# Patient Record
Sex: Male | Born: 1955 | Race: Black or African American | Hispanic: No | Marital: Single | Smoking: Never smoker
Health system: Southern US, Community
[De-identification: ages and names within clinical notes are randomized; demographics above are authoritative.]

## PROBLEM LIST (undated history)

## (undated) DIAGNOSIS — Z9989 Dependence on other enabling machines and devices: Secondary | ICD-10-CM

## (undated) DIAGNOSIS — E119 Type 2 diabetes mellitus without complications: Secondary | ICD-10-CM

## (undated) DIAGNOSIS — R6882 Decreased libido: Secondary | ICD-10-CM

## (undated) DIAGNOSIS — C61 Malignant neoplasm of prostate: Secondary | ICD-10-CM

## (undated) DIAGNOSIS — F329 Major depressive disorder, single episode, unspecified: Secondary | ICD-10-CM

## (undated) DIAGNOSIS — N529 Male erectile dysfunction, unspecified: Secondary | ICD-10-CM

## (undated) DIAGNOSIS — R339 Retention of urine, unspecified: Secondary | ICD-10-CM

## (undated) DIAGNOSIS — I10 Essential (primary) hypertension: Secondary | ICD-10-CM

## (undated) DIAGNOSIS — M1710 Unilateral primary osteoarthritis, unspecified knee: Secondary | ICD-10-CM

## (undated) DIAGNOSIS — G4733 Obstructive sleep apnea (adult) (pediatric): Secondary | ICD-10-CM

## (undated) DIAGNOSIS — R972 Elevated prostate specific antigen [PSA]: Secondary | ICD-10-CM

## (undated) DIAGNOSIS — Z87442 Personal history of urinary calculi: Secondary | ICD-10-CM

## (undated) DIAGNOSIS — F32A Depression, unspecified: Secondary | ICD-10-CM

## (undated) DIAGNOSIS — I2510 Atherosclerotic heart disease of native coronary artery without angina pectoris: Secondary | ICD-10-CM

## (undated) DIAGNOSIS — N320 Bladder-neck obstruction: Secondary | ICD-10-CM

## (undated) DIAGNOSIS — E785 Hyperlipidemia, unspecified: Secondary | ICD-10-CM

## (undated) DIAGNOSIS — K649 Unspecified hemorrhoids: Secondary | ICD-10-CM

## (undated) HISTORY — DX: Unilateral primary osteoarthritis, unspecified knee: M17.10

## (undated) HISTORY — DX: Retention of urine, unspecified: R33.9

## (undated) HISTORY — DX: Malignant neoplasm of prostate: C61

## (undated) HISTORY — DX: Type 2 diabetes mellitus without complications: E11.9

## (undated) HISTORY — DX: Decreased libido: R68.82

## (undated) HISTORY — DX: Elevated prostate specific antigen (PSA): R97.20

## (undated) HISTORY — DX: Hyperlipidemia, unspecified: E78.5

## (undated) HISTORY — DX: Bladder-neck obstruction: N32.0

## (undated) HISTORY — DX: Obstructive sleep apnea (adult) (pediatric): G47.33

## (undated) HISTORY — DX: Male erectile dysfunction, unspecified: N52.9

## (undated) HISTORY — DX: Obstructive sleep apnea (adult) (pediatric): Z99.89

## (undated) HISTORY — PX: COLONOSCOPY: SHX174

## (undated) HISTORY — PX: OTHER SURGICAL HISTORY: SHX169

---
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
## 2006-07-15 ENCOUNTER — Emergency Department: Payer: Self-pay | Admitting: Emergency Medicine

## 2007-08-10 ENCOUNTER — Emergency Department: Payer: Self-pay | Admitting: Emergency Medicine

## 2007-08-10 ENCOUNTER — Other Ambulatory Visit: Payer: Self-pay

## 2007-08-12 ENCOUNTER — Emergency Department: Payer: Self-pay | Admitting: Emergency Medicine

## 2007-12-27 ENCOUNTER — Emergency Department: Payer: Self-pay | Admitting: Emergency Medicine

## 2008-05-17 ENCOUNTER — Emergency Department: Payer: Self-pay | Admitting: Emergency Medicine

## 2008-06-18 ENCOUNTER — Ambulatory Visit: Payer: Self-pay | Admitting: General Practice

## 2008-06-23 ENCOUNTER — Ambulatory Visit: Payer: Self-pay | Admitting: General Practice

## 2008-08-05 ENCOUNTER — Encounter: Admission: RE | Admit: 2008-08-05 | Discharge: 2008-08-05 | Payer: Self-pay | Admitting: Orthopedic Surgery

## 2008-08-17 ENCOUNTER — Encounter: Admission: RE | Admit: 2008-08-17 | Discharge: 2008-08-17 | Payer: Self-pay | Admitting: Orthopedic Surgery

## 2008-09-01 ENCOUNTER — Encounter: Admission: RE | Admit: 2008-09-01 | Discharge: 2008-09-01 | Payer: Self-pay | Admitting: Orthopedic Surgery

## 2010-08-26 ENCOUNTER — Ambulatory Visit: Payer: Self-pay | Admitting: Unknown Physician Specialty

## 2010-08-28 LAB — PATHOLOGY REPORT

## 2012-04-21 ENCOUNTER — Emergency Department: Payer: Self-pay | Admitting: Internal Medicine

## 2012-04-21 LAB — URINALYSIS, COMPLETE
Bacteria: NONE SEEN
Bilirubin,UR: NEGATIVE
Glucose,UR: 150 mg/dL (ref 0–75)
Leukocyte Esterase: NEGATIVE
Nitrite: NEGATIVE
Ph: 6 (ref 4.5–8.0)
Protein: NEGATIVE
RBC,UR: 92 /HPF (ref 0–5)
Specific Gravity: 1.027 (ref 1.003–1.030)
Squamous Epithelial: <1
WBC UR: 1 /HPF (ref 0–5)

## 2012-04-21 LAB — COMPREHENSIVE METABOLIC PANEL
Albumin: 3.6 g/dL (ref 3.4–5.0)
Alkaline Phosphatase: 114 U/L (ref 50–136)
Anion Gap: 5 — ABNORMAL LOW (ref 7–16)
BUN: 22 mg/dL — ABNORMAL HIGH (ref 7–18)
Bilirubin,Total: 0.4 mg/dL (ref 0.2–1.0)
Calcium, Total: 8.7 mg/dL (ref 8.5–10.1)
Chloride: 107 mmol/L (ref 98–107)
Co2: 27 mmol/L (ref 21–32)
Creatinine: 1.51 mg/dL — ABNORMAL HIGH (ref 0.60–1.30)
EGFR (African American): 59 — ABNORMAL LOW
EGFR (Non-African Amer.): 51 — ABNORMAL LOW
Glucose: 212 mg/dL — ABNORMAL HIGH (ref 65–99)
Osmolality: 287 (ref 275–301)
Potassium: 3.8 mmol/L (ref 3.5–5.1)
SGOT(AST): 24 U/L (ref 15–37)
SGPT (ALT): 35 U/L (ref 12–78)
Sodium: 139 mmol/L (ref 136–145)
Total Protein: 7.5 g/dL (ref 6.4–8.2)

## 2012-04-21 LAB — CBC
HCT: 38.6 % — ABNORMAL LOW (ref 40.0–52.0)
HGB: 12.3 g/dL — ABNORMAL LOW (ref 13.0–18.0)
MCH: 25.5 pg — ABNORMAL LOW (ref 26.0–34.0)
MCHC: 31.8 g/dL — ABNORMAL LOW (ref 32.0–36.0)
MCV: 80 fL (ref 80–100)
Platelet: 300 10*3/uL (ref 150–440)
RBC: 4.81 10*6/uL (ref 4.40–5.90)
RDW: 14.9 % — ABNORMAL HIGH (ref 11.5–14.5)
WBC: 8.1 10*3/uL (ref 3.8–10.6)

## 2012-04-21 LAB — LIPASE, BLOOD: Lipase: 147 U/L (ref 73–393)

## 2013-01-10 DIAGNOSIS — R339 Retention of urine, unspecified: Secondary | ICD-10-CM

## 2013-01-10 DIAGNOSIS — N529 Male erectile dysfunction, unspecified: Secondary | ICD-10-CM

## 2013-01-10 DIAGNOSIS — R6882 Decreased libido: Secondary | ICD-10-CM | POA: Insufficient documentation

## 2013-01-10 HISTORY — DX: Decreased libido: R68.82

## 2013-01-10 HISTORY — DX: Retention of urine, unspecified: R33.9

## 2013-01-10 HISTORY — DX: Male erectile dysfunction, unspecified: N52.9

## 2013-02-21 DIAGNOSIS — R972 Elevated prostate specific antigen [PSA]: Secondary | ICD-10-CM | POA: Insufficient documentation

## 2013-02-21 HISTORY — DX: Elevated prostate specific antigen (PSA): R97.20

## 2013-04-28 DIAGNOSIS — C61 Malignant neoplasm of prostate: Secondary | ICD-10-CM | POA: Insufficient documentation

## 2013-04-28 DIAGNOSIS — N320 Bladder-neck obstruction: Secondary | ICD-10-CM

## 2013-04-28 HISTORY — DX: Bladder-neck obstruction: N32.0

## 2013-04-28 HISTORY — DX: Malignant neoplasm of prostate: C61

## 2013-05-27 ENCOUNTER — Ambulatory Visit: Payer: Self-pay | Admitting: Urology

## 2013-07-14 HISTORY — PX: PROSTATE SURGERY: SHX751

## 2013-07-22 ENCOUNTER — Emergency Department: Payer: Self-pay | Admitting: Emergency Medicine

## 2013-07-22 LAB — URINALYSIS, COMPLETE
Bacteria: NONE SEEN
Bilirubin,UR: NEGATIVE
Glucose,UR: NEGATIVE mg/dL (ref 0–75)
Ketone: NEGATIVE
Nitrite: NEGATIVE
Ph: 7 (ref 4.5–8.0)
Protein: 30
RBC,UR: 38 /HPF (ref 0–5)
Specific Gravity: 1.017 (ref 1.003–1.030)
Squamous Epithelial: 11
WBC UR: 6 /HPF (ref 0–5)

## 2013-07-23 LAB — URINE CULTURE

## 2015-07-19 ENCOUNTER — Ambulatory Visit (INDEPENDENT_AMBULATORY_CARE_PROVIDER_SITE_OTHER): Payer: BLUE CROSS/BLUE SHIELD | Admitting: Urology

## 2015-07-19 ENCOUNTER — Telehealth: Payer: Self-pay

## 2015-07-19 ENCOUNTER — Encounter: Payer: Self-pay | Admitting: Urology

## 2015-07-19 VITALS — BP 152/86 | HR 99 | Ht 73.0 in | Wt 288.4 lb

## 2015-07-19 DIAGNOSIS — Z8546 Personal history of malignant neoplasm of prostate: Secondary | ICD-10-CM | POA: Diagnosis not present

## 2015-07-19 DIAGNOSIS — C61 Malignant neoplasm of prostate: Secondary | ICD-10-CM | POA: Diagnosis not present

## 2015-07-19 NOTE — Telephone Encounter (Deleted)
Schedule and appt for teaching for his trimix.

## 2015-07-19 NOTE — Progress Notes (Signed)
07/19/2015 3:32 PM   Daniel Leyland Jr. 1955/09/08 [id number]  Referring provider: No referring provider defined for this encounter.  Chief Complaint  Patient presents with  . New Patient (Initial Visit)    ED    HPI: The patient is a 60 year old gentleman with PMH of gleason 3+4=7 prostate cancer treated with radical prostatectomy in 2015 presents for discussion of his erectile dysfunction.   1. Prostate cancer Treated with radical prostatectomy in 2015. As far as he knows, his PSA has been undetectable. He has minor incontinence but does not work pads.   2. Erectile dysfunction The patient has been unable to achieve erection since his prostatectomy in June 2015. He has tried Viagra, generic sildenafil, Levitra, and Cialis with no success.  PMH: Past Medical History  Diagnosis Date  . 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 Malignant neoplasm of prostate ([place]) 04/28/2013  . Decreased libido 01/10/2013  . Elevated prostate specific antigen (PSA) 02/21/2013  . Incomplete bladder emptying 01/10/2013    Surgical History: Past Surgical History  Procedure Laterality Date  . Prostate surgery  07/14/2013    Prostatectomy    Home Medications:    Medication List       This list is accurate as of: 07/19/15  3:32 PM.  Always use your most recent med list.               lisinopril-hydrochlorothiazide 20-25 MG tablet  Commonly known as:  PRINZIDE,ZESTORETIC  Take 1 tablet by mouth daily.     lovastatin 20 MG tablet  Commonly known as:  MEVACOR  TK 1 T PO QD     meloxicam 7.5 MG tablet  Commonly known as:  MOBIC  TK 1 T PO QAM PRN P     metFORMIN 500 MG tablet  Commonly known as:  GLUCOPHAGE  Take 500 mg by mouth 2 (two) times daily with a meal.        Allergies: No Known Allergies  Family History: Family History  Problem Relation Age of Onset  . Prostate cancer Neg Hx   . Pancreatic cancer Brother       Social History:  reports that he has never smoked. He does not have any smokeless tobacco history on file. He reports that he does not drink alcohol or use illicit drugs.  ROS: UROLOGY Frequent Urination?: Yes Hard to postpone urination?: No Burning/pain with urination?: No Get up at night to urinate?: Yes Leakage of urine?: Yes Urine stream starts and stops?: No Trouble starting stream?: No Do you have to strain to urinate?: No Blood in urine?: No Urinary tract infection?: No Sexually transmitted disease?: No Injury to kidneys or bladder?: No Painful intercourse?: No Weak stream?: No Erection problems?: Yes Penile pain?: No  Gastrointestinal Nausea?: No Vomiting?: No Indigestion/heartburn?: No Diarrhea?: No Constipation?: No  Constitutional Fever: No Night sweats?: No Weight loss?: No Fatigue?: No  Skin Skin rash/lesions?: No Itching?: Yes  Eyes Blurred vision?: No Double vision?: No  Ears/Nose/Throat Sore throat?: No Sinus problems?: No  Hematologic/Lymphatic Swollen glands?: No Easy bruising?: No  Cardiovascular Leg swelling?: No Chest pain?: No  Respiratory Cough?: No Shortness of breath?: No  Endocrine Excessive thirst?: No  Musculoskeletal Back pain?: Yes Joint pain?: Yes  Neurological Headaches?: No Dizziness?: No  Psychologic Depression?: Yes Anxiety?: No  Physical Exam: BP 152/86 mmHg  Pulse 99  Ht 6\' 1"  (1.854 m)  Wt 288 lb 6.4 oz (130.817 kg)  BMI 38.06 kg/m2  Constitutional:  Alert and oriented, No acute distress. HEENT: [place] AT, moist mucus membranes.  Trachea midline, no masses. Cardiovascular: No clubbing, cyanosis, or edema. Respiratory: Normal respiratory effort, no increased work of breathing. GI: Abdomen is soft, nontender, nondistended, no abdominal masses GU: No CVA tenderness.  Skin: No rashes, bruises or suspicious lesions. Lymph: No cervical or inguinal adenopathy. Neurologic: Grossly intact, no focal  deficits, moving all 4 extremities. Psychiatric: Normal mood and affect.  Laboratory Data: Lab Results  Component Value Date   WBC 8.1 04/21/2012   HGB 12.3* 04/21/2012   HCT 38.6* 04/21/2012   MCV 80 04/21/2012   PLT 300 04/21/2012    Lab Results  Component Value Date   CREATININE 1.51* 04/21/2012    No results found for: PSA  No results found for: TESTOSTERONE  No results found for: HGBA1C  Urinalysis    Component Value Date/Time   COLORURINE Yellow 07/22/2013 1020   APPEARANCEUR Cloudy 07/22/2013 1020   LABSPEC 1.017 07/22/2013 1020   PHURINE 7.0 07/22/2013 1020   GLUCOSEU Negative 07/22/2013 1020   HGBUR 3+ 07/22/2013 1020   BILIRUBINUR Negative 07/22/2013 1020   KETONESUR Negative 07/22/2013 1020   PROTEINUR 30 mg/dL 07/22/2013 1020   NITRITE Negative 07/22/2013 1020   LEUKOCYTESUR 3+ 07/22/2013 1020    Assessment & Plan:    1. History of prostate cancer -Check PSA today  2. Erectile dysfunction -I discussed the patient the pros and cons of various treatment methods for erectile dysfunction. He elects to undergo Trymex injection therapy. We discussed the risks, benefits and indications this procedure. He understands risks include priapism which will need emergent intervention. He will obtain this medication and return to the clinic for teaching. He was instructed not to use this medication until he has had formal teaching.  Return in 2 weeks (on 08/02/2015) for trimix teaching.  Nickie Retort, MD  [facility] [address] [place],  [address] [phone]

## 2015-07-19 NOTE — Telephone Encounter (Addendum)
I called [facility] and called in Trimix 10 syringes/ 1 ml. The pt was instructed at his appt that he needs to schedule a f/u appt and bring his Yrimix so he can be taught how to administer.

## 2015-07-20 LAB — PSA: Prostate Specific Ag, Serum: <0.1 ng/mL (ref 0.0–4.0)

## 2015-07-26 ENCOUNTER — Telehealth: Payer: Self-pay

## 2015-07-26 NOTE — Telephone Encounter (Signed)
-----   Message from Nickie Retort, MD sent at 07/26/2015  8:41 AM EDT ----- Please let patient know PSA is undetectable which is good

## 2015-07-26 NOTE — Telephone Encounter (Signed)
LMOM

## 2015-07-26 NOTE — Telephone Encounter (Signed)
Spoke with pt in reference to PSA results. Pt voiced understanding. Pt stated his syringes came in the mail and therefore he needs to make an appt. Pt was transferred to the front to make f/u appt.

## 2015-07-30 ENCOUNTER — Other Ambulatory Visit: Payer: Self-pay | Admitting: Physician Assistant

## 2015-08-13 ENCOUNTER — Ambulatory Visit (INDEPENDENT_AMBULATORY_CARE_PROVIDER_SITE_OTHER): Payer: BLUE CROSS/BLUE SHIELD | Admitting: Urology

## 2015-08-13 ENCOUNTER — Encounter: Payer: Self-pay | Admitting: Urology

## 2015-08-13 VITALS — BP 105/65 | HR 101 | Ht 73.0 in | Wt 288.0 lb

## 2015-08-13 DIAGNOSIS — Z8546 Personal history of malignant neoplasm of prostate: Secondary | ICD-10-CM | POA: Diagnosis not present

## 2015-08-13 DIAGNOSIS — N5231 Erectile dysfunction following radical prostatectomy: Secondary | ICD-10-CM

## 2015-08-13 NOTE — Progress Notes (Signed)
08/13/2015 10:36 AM   Daniel Bradley. 10-Jan-1956 [id number]  Referring provider: No referring provider defined for this encounter.  Chief Complaint  Patient presents with  . Erectile Dysfunction    Trimix teaching    HPI: The patient is a 60 year old gentleman with PMH of gleason 3+4=7 prostate cancer treated with radical prostatectomy in 2015 presents for discussion of his erectile dysfunction.   1. Prostate cancer Treated with radical prostatectomy in 2015. As far as he knows, his PSA has been undetectable - most recently in June 2017. He has minor incontinence but does not wear pads.   2. Erectile dysfunction The patient has been unable to achieve erection since his prostatectomy in June 2015. He has tried Viagra, generic sildenafil, Levitra, and Cialis with no success.    Patient presents today for Trimix teaching.   PMH: Past Medical History  Diagnosis Date  . 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 Malignant neoplasm of prostate ([place]) 04/28/2013  . Decreased libido 01/10/2013  . Elevated prostate specific antigen (PSA) 02/21/2013  . Incomplete bladder emptying 01/10/2013    Surgical History: Past Surgical History  Procedure Laterality Date  . Prostate surgery  07/14/2013    Prostatectomy    Home Medications:    Medication List       This list is accurate as of: 08/13/15 10:36 AM.  Always use your most recent med list.               lisinopril-hydrochlorothiazide 20-25 MG tablet  Commonly known as:  PRINZIDE,ZESTORETIC  Take 1 tablet by mouth daily.     lovastatin 20 MG tablet  Commonly known as:  MEVACOR  TK 1 T PO QD     meloxicam 7.5 MG tablet  Commonly known as:  MOBIC  TK 1 T PO QAM PRN P     metFORMIN 500 MG tablet  Commonly known as:  GLUCOPHAGE  Take 500 mg by mouth 2 (two) times daily with a meal.        Allergies: No Known Allergies  Family History: Family History    Problem Relation Age of Onset  . Prostate cancer Neg Hx   . Pancreatic cancer Brother     Social History:  reports that he has never smoked. He does not have any smokeless tobacco history on file. He reports that he does not drink alcohol or use illicit drugs.  ROS: UROLOGY Frequent Urination?: No Hard to postpone urination?: No Burning/pain with urination?: No Get up at night to urinate?: No Leakage of urine?: No Urine stream starts and stops?: No Trouble starting stream?: No Do you have to strain to urinate?: No Blood in urine?: No Urinary tract infection?: No Sexually transmitted disease?: No Injury to kidneys or bladder?: No Painful intercourse?: No Weak stream?: No Erection problems?: Yes Penile pain?: No  Gastrointestinal Nausea?: No Vomiting?: No Indigestion/heartburn?: No Diarrhea?: No Constipation?: No  Constitutional Fever: No Night sweats?: No Weight loss?: No Fatigue?: No  Skin Skin rash/lesions?: No Itching?: No  Eyes Blurred vision?: No Double vision?: No  Ears/Nose/Throat Sore throat?: No Sinus problems?: No  Hematologic/Lymphatic Swollen glands?: No Easy bruising?: No  Cardiovascular Leg swelling?: No Chest pain?: No  Respiratory Cough?: No Shortness of breath?: No  Endocrine Excessive thirst?: No  Musculoskeletal Back pain?: No Joint pain?: No  Neurological Headaches?: No Dizziness?: No  Psychologic Depression?: No Anxiety?: No  Physical Exam: BP 105/65 mmHg  Pulse 101  Ht 6\' 1"  (1.854 m)  Wt 288 lb (130.636 kg)  BMI 38.01 kg/m2  Constitutional:  Alert and oriented, No acute distress. HEENT: [place] AT, moist mucus membranes.  Trachea midline, no masses. Cardiovascular: No clubbing, cyanosis, or edema. Respiratory: Normal respiratory effort, no increased work of breathing. GI: Abdomen is soft, nontender, nondistended, no abdominal masses GU: No CVA tenderness.  Skin: No rashes, bruises or suspicious  lesions. Lymph: No cervical or inguinal adenopathy. Neurologic: Grossly intact, no focal deficits, moving all 4 extremities. Psychiatric: Normal mood and affect.  Laboratory Data: Lab Results  Component Value Date   WBC 8.1 04/21/2012   HGB 12.3* 04/21/2012   HCT 38.6* 04/21/2012   MCV 80 04/21/2012   PLT 300 04/21/2012    Lab Results  Component Value Date   CREATININE 1.51* 04/21/2012    No results found for: PSA  No results found for: TESTOSTERONE  No results found for: HGBA1C  Urinalysis    Component Value Date/Time   COLORURINE Yellow 07/22/2013 1020   APPEARANCEUR Cloudy 07/22/2013 1020   LABSPEC 1.017 07/22/2013 1020   PHURINE 7.0 07/22/2013 1020   GLUCOSEU Negative 07/22/2013 1020   HGBUR 3+ 07/22/2013 1020   BILIRUBINUR Negative 07/22/2013 1020   KETONESUR Negative 07/22/2013 1020   PROTEINUR 30 mg/dL 07/22/2013 1020   NITRITE Negative 07/22/2013 1020   LEUKOCYTESUR 3+ 07/22/2013 1020     Assessment & Plan:    The patient was demonstrated how to use Trimix injection stay. He was informed to only injected 3 or 9:00 on his penis. He was warned not to inject at any other location as this risk damage to the urethra and penile nerves. He voiced an understanding. 0.5 mL was injected into his left corpora. Able to achieve a semirigid erection. We'll continue this dosage for now. He was instructed to increase the dose by 0.1 mL total of 0.6 mL if his erection is not sufficient for intercourse after foreplay. He was again warned of the risk of priapism and the need for emergent intervention.  1. Erectile dysfunction -patient will use trimix. He'll follow-up in 3 months to assess his progress  2. History of prostate cancer Due for PSA in June 2018  Return in about 3 months (around 11/13/2015).  Nickie Retort, MD  [facility] [address] [place], [place] [address] [phone]

## 2015-09-04 ENCOUNTER — Other Ambulatory Visit: Payer: Self-pay | Admitting: Physician Assistant

## 2015-09-04 NOTE — Telephone Encounter (Signed)
I don't think this is your patient.    Thanks,   -Mickel Baas

## 2015-11-13 ENCOUNTER — Encounter: Payer: Self-pay | Admitting: Urology

## 2015-11-13 ENCOUNTER — Ambulatory Visit (INDEPENDENT_AMBULATORY_CARE_PROVIDER_SITE_OTHER): Payer: BLUE CROSS/BLUE SHIELD | Admitting: Urology

## 2015-11-13 VITALS — BP 141/82 | HR 84 | Ht 73.0 in | Wt 298.2 lb

## 2015-11-13 DIAGNOSIS — Z8546 Personal history of malignant neoplasm of prostate: Secondary | ICD-10-CM

## 2015-11-13 DIAGNOSIS — N529 Male erectile dysfunction, unspecified: Secondary | ICD-10-CM | POA: Diagnosis not present

## 2015-11-13 NOTE — Progress Notes (Signed)
11/13/2015 10:02 AM   Daniel Leyland Jr. [birth year] [id number]  Referring provider: No referring provider defined for this encounter.  Chief Complaint  Patient presents with  . Erectile Dysfunction    3 month follow up  . Prostate Cancer    history of     HPI: The patient is a 60 year old African American gentleman with PMH of gleason 3+4=7 prostate cancer treated with radical prostatectomy in 2015 presents for further discussion of his erectile dysfunction after being prescribed Trimix and injected with test dose.    1. Prostate cancer Treated with radical prostatectomy in 2015. As far as he knows, his PSA has been undetectable - most recently in June 2017. He has minor incontinence but does not wear pads.   2. Erectile dysfunction The patient has been unable to achieve erection since his prostatectomy in June 2015. He has tried Viagra, generic sildenafil, Levitra, and Cialis with no success.   Patient was injected with .5 mL of Trimix and experienced a moderate erection.  His erection was not prolonged or painful.  It did not have a curve.  He has been hesitant to injection himself since his office visit three months ago.   His SHIM score today is 5.        SHIM    Row Name 11/13/15 0917         SHIM: Over the last 6 months:   How do you rate your confidence that you could get and keep an erection? Very Low     When you had erections with sexual stimulation, how often were your erections hard enough for penetration (entering your partner)? Almost Never or Never     During sexual intercourse, how often were you able to maintain your erection after you had penetrated (entered) your partner? Extremely Difficult     During sexual intercourse, how difficult was it to maintain your erection to completion of intercourse? Extremely Difficult     When you attempted sexual intercourse, how often was it satisfactory for you? Extremely Difficult       SHIM Total Score   SHIM 5        Score: 1-7 Severe ED 8-11 Moderate ED 12-16 Mild-Moderate ED 17-21 Mild ED 22-25 No ED   PMH: Past Medical History:  Diagnosis Date  . 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 Decreased libido 01/10/2013  . 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 Elevated prostate specific antigen (PSA) 02/21/2013  . Incomplete bladder emptying 01/10/2013  . Malignant neoplasm of prostate ([place]) 04/28/2013    Surgical History: Past Surgical History:  Procedure Laterality Date  . PROSTATE SURGERY  07/14/2013   Prostatectomy    Home Medications:    Medication List       Accurate as of 11/13/15 10:02 AM. Always use your most recent med list.          lisinopril-hydrochlorothiazide 20-25 MG tablet Commonly known as:  PRINZIDE,ZESTORETIC Take 1 tablet by mouth daily.   lovastatin 20 MG tablet Commonly known as:  MEVACOR TK 1 T PO QD   meloxicam 7.5 MG tablet Commonly known as:  MOBIC TK 1 T PO QAM PRN P   metFORMIN 500 MG tablet Commonly known as:  GLUCOPHAGE Take 500 mg by mouth 2 (two) times daily with a meal.       Allergies: No Known Allergies  Family History: Family History  Problem Relation Age of Onset  . Pancreatic cancer Brother   . Prostate cancer Neg  Hx   . Kidney disease Neg Hx     Social History:  reports that he has never smoked. He has never used smokeless tobacco. He reports that he does not drink alcohol or use drugs.  ROS: UROLOGY Frequent Urination?: No Hard to postpone urination?: No Burning/pain with urination?: No Get up at night to urinate?: No Leakage of urine?: No Urine stream starts and stops?: No Trouble starting stream?: No Do you have to strain to urinate?: No Blood in urine?: No Urinary tract infection?: No Sexually transmitted disease?: No Injury to kidneys or bladder?: No Painful intercourse?: No Weak stream?: No Erection problems?: Yes Penile pain?: No  Gastrointestinal Nausea?: No Vomiting?:  No Indigestion/heartburn?: No Diarrhea?: No Constipation?: No  Constitutional Fever: No Night sweats?: No Weight loss?: No Fatigue?: No  Skin Skin rash/lesions?: No Itching?: No  Eyes Blurred vision?: No Double vision?: No  Ears/Nose/Throat Sore throat?: No Sinus problems?: No  Hematologic/Lymphatic Swollen glands?: No Easy bruising?: No  Cardiovascular Leg swelling?: No Chest pain?: No  Respiratory Cough?: No Shortness of breath?: No  Endocrine Excessive thirst?: No  Musculoskeletal Back pain?: No Joint pain?: No  Neurological Headaches?: No Dizziness?: No  Psychologic Depression?: No Anxiety?: No  Physical Exam: BP (!) 141/82   Pulse 84   Ht 6\' 1"  (1.854 m)   Wt 298 lb 3.2 oz (135.3 kg)   BMI 39.34 kg/m   Constitutional:  Alert and oriented, No acute distress. HEENT: [place] AT, moist mucus membranes.  Trachea midline, no masses. Cardiovascular: No clubbing, cyanosis, or edema. Respiratory: Normal respiratory effort, no increased work of breathing. GI: Abdomen is soft, nontender, nondistended, no abdominal masses GU: No CVA tenderness.  Skin: No rashes, bruises or suspicious lesions. Lymph: No cervical or inguinal adenopathy. Neurologic: Grossly intact, no focal deficits, moving all 4 extremities. Psychiatric: Normal mood and affect.  Laboratory Data: Lab Results  Component Value Date   WBC 8.1 04/21/2012   HGB 12.3 (L) 04/21/2012   HCT 38.6 (L) 04/21/2012   MCV 80 04/21/2012   PLT 300 04/21/2012    Lab Results  Component Value Date   CREATININE 1.51 (H) 04/21/2012      Assessment & Plan:    I reviewed with the patient how to use Trimix injection today.  He was informed to only injected 3 or 9:00 on his penis.  He was warned not to inject at any other location as this risk damage to the urethra and penile nerves.  He voiced an understanding.  We'll continue this dosage for now.  He was instructed to increase the dose by 0.1 mL total  of 0.6 mL if his erection is not sufficient for intercourse after foreplay. He was again warned of the risk of priapism and the need for emergent intervention.  1. Erectile dysfunction -patient will use trimix if he gains confidence with the injections  2. History of prostate cancer Due for PSA in June 2018  Return for in JUNE 2018 for PSA, IPSS and exam.  Zara Council, [facility]  [facility] [address], [place], [place] [address] [phone]

## 2016-05-12 ENCOUNTER — Encounter: Payer: Self-pay | Admitting: Dietician

## 2016-05-12 ENCOUNTER — Encounter: Payer: BLUE CROSS/BLUE SHIELD | Attending: Family Medicine | Admitting: Dietician

## 2016-05-12 VITALS — Ht 73.0 in | Wt 293.4 lb

## 2016-05-12 DIAGNOSIS — E119 Type 2 diabetes mellitus without complications: Secondary | ICD-10-CM | POA: Diagnosis not present

## 2016-05-12 NOTE — Progress Notes (Signed)
Medical Nutrition Therapy: Visit start time: 3825  end time: 1630  Assessment:  Diagnosis: Diabetes, obesity Past medical history: HLD, HTN, sleep apnea Psychosocial issues/ stress concerns: some recent mild depression symptoms due to death of sibling; has lost 3 siblings in the past 3 years.   Preferred learning method:  . Hands-on  Current weight: 293.4lbs with shoes  Height: 6'1" Medications, supplements: reconciled list in medical record  Progress and evaluation: Patient reports no previous diabetes education. He has been working on weight loss and had lost from 343lbs to 265lbs when sister passed away, stopped regular exercise, and ate more over the holidays -- then has regained some of the weight. He feels he is now ready to resume healthier lifestyle habits. He states he loves sweets and snacks on sweets at night, also loves pizza.    Physical activity: walking during day at work, up and down steps; was going to [employer]-- plans to resume as of today.  Dietary Intake:  Usual eating pattern includes 3 meals and 1 snacks per day. Dining out frequency: 1-2 meals per week.  Breakfast: salad, bagel with cheese and ham, fruit orange, apple, cantaloupe, occasional biscuit Snack: none Lunch: brings from home: ham and cheese sandwich with mayo on white bread, chips, fruit Snack: none Supper: reports larger portions-- baked, broiled meats. Fish and salad; spaghetti and salad; baked chicken with potato; loves pizza Snack: loves ice cream, cookies, cakes, etc.  Beverages: water, 1 can pepsi every 1-2 days  Nutrition Care Education: Topics covered: diabetes, weight management Basic nutrition: basic food groups, appropriate nutrient balance, appropriate meal and snack schedule, general nutrition guidelines    Weight control: benefits of weight control, behavioral changes for weight loss-- controlling food portions by decreasing portions of starches and/or meats, and increasing vegetable  portions. Worked on basic meal planning using plate method and food models. Discussed role of exercise in managing weight, mood, diabetes, and cholesterol. Diabetes: appropriate meal and snack schedule, appropriate carb intake and balance, reviewed lab results for BG, HbA1C Hyperlipidemia:  target goals for lipids, healthy and unhealthy fats, role of fiber  Nutritional Diagnosis:  [place]-2.2 Altered nutrition-related laboratory As related to diabetes, LDL cholesterol.  As evidenced by lab report. [place]-3.3 Overweight/obesity As related to excess calories, decreased activity.  As evidenced by BMI 38.5.  Intervention: Instruction as noted above.   Patient will work to decrease sugar intake and improve nutritional balance in his meals.    Set goals with input from patient.   Education Materials given:  . General diet guidelines for Diabetes . Plate Planner with food lists . Sample meal pattern/ menus . Snacking handout . Goals/ instructions  Learner/ who was taught:  . Patient   Level of understanding: Marland Kitchen Verbalizes/ demonstrates competency  Demonstrated degree of understanding via:   Teach back Learning barriers: . None  Willingness to learn/ readiness for change: . Eager, change in progress  Monitoring and Evaluation:  Dietary intake, exercise, BG control, and body weight      follow up: 06/11/16

## 2016-05-12 NOTE — Patient Instructions (Addendum)
   Choose healthy snacks at night. Use snack list for ideas. Work on reducing how often you eat sweets, or smaller portions.   Eat balanced meals with protein, controlled amounts of starches, and plenty of vegetables and/ or fruits with meals.   Resume exercise at gym.

## 2016-06-11 ENCOUNTER — Encounter: Payer: Self-pay | Admitting: Dietician

## 2016-06-11 ENCOUNTER — Encounter: Payer: BLUE CROSS/BLUE SHIELD | Attending: Family Medicine | Admitting: Dietician

## 2016-06-11 VITALS — Ht 73.0 in | Wt 293.0 lb

## 2016-06-11 DIAGNOSIS — E119 Type 2 diabetes mellitus without complications: Secondary | ICD-10-CM

## 2016-06-11 NOTE — Patient Instructions (Signed)
   Include a snack before going to the gym such as a small sandwich, fruit and nuts or lowfat cheese, or a granola bar.   Limit carbs with nighttime snack-- keep to 30grams or less, maybe 1/2 or 1 sandwich, fruit and nuts or cheese, 3-6 cups popcorn with nuts or cheese, 2-3 whole graham crackers with peanut butter.   Remember to eat enough during the day to help control hunger and appetite at night.  Check blood sugar 2 hours after supper occasionally(instead of after lunch)  to make sure it is at least under 180, ideally under 150. Fasting blood sugar (no eating, and resting for at least 8 hours) goal is 130 or less.

## 2016-06-11 NOTE — Progress Notes (Signed)
Medical Nutrition Therapy: Visit start time: 0900  end time: 0945 Assessment:  Diagnosis: Diabetes, obesity Medical history changes: no changes Psychosocial issues/ stress concerns: some depression at time per patient  Current weight: 293lbs  Height: 6'1" Medications, supplement changes: no changes  Progress and evaluation: Patient reports increased exercise, although not as often as he should.  Has decreased sweets. Feels he is overeating bread, some food portions due to hunger in the evenings. He reports fasting BGs average 120s in am; 90s-low 100s 2-3 hours after lunch.   Physical activity: gym 2-3 times a week treadmill 35-40 min and weights 15-58min, crunches 10-15  Dietary Intake:  Usual eating pattern includes 2-3 meals and 1-2 snacks per day. Dining out frequency: not assessed today.  Breakfast: low fat, low sodium ham or [place] sandwich, sometimes skips if not hungry Snack: granola or protein bar Lunch: 11am--sandwich, banana apple, or grapes or granola bar if not hungry Snack: none Supper: baked, broiled meats like fish, chicken with salad or other veg., potato, pasta.  Snack: sometimes more of supper foods, or cereal.  Beverages: mostly water, rarely soda  Nutrition Care Education: Topics covered: weight management, diabetes Basic nutrition: appropriate meal and snack schedule    Weight control: basic caloric and carbohydrate needs/ limits to achieve weight loss; managing evening hunger by icnreasing intake earlier in the day and controlling portions of starches; strategies to control food portions. Diabetes:  goals for BGs, appropriate meal and snack schedule, appropriate carb intake and balance--max 4 carb servings or 60g each meal; used food models to review portions and meal balance. Discussed including high fiber foods and adequate low-carb vegetables and protein to control appetite.    Nutritional Diagnosis:  [place]-3.3 Overweight/obesity As related to excess calories.  As  evidenced by BMI 38.6, patient report.  Intervention: Discussion as noted above.   Updated goals with input from patient.    Patient states he will continue to increase exercise, feels more ready now to work on additional diet changes.  Education Materials given:  Marland Kitchen Goals/ instructions  Learner/ who was taught:  . Patient   Level of understanding: Marland Kitchen Verbalizes/ demonstrates competency  Demonstrated degree of understanding via:   Teach back Learning barriers: . None  Willingness to learn/ readiness for change: . Eager, change in progress  Monitoring and Evaluation:  Dietary intake, exercise, BG control, and body weight      follow up: 07/23/16

## 2016-07-13 NOTE — Progress Notes (Signed)
07/14/2016 11:08 AM   Daniel Leyland Jr. 1955/05/08 [id number]  Referring provider: No referring provider defined for this encounter.  Chief Complaint  Patient presents with  . Prostate Cancer    61months w/PSA    HPI: The patient is a 61 year old African American gentleman with PMH of gleason 3+4=7 prostate cancer treated with radical prostatectomy in 2015 and ED who presents today for a 6 month follow up.  1. Prostate cancer Treated with radical prostatectomy in 2015. As far as he knows, his PSA has been undetectable - most recently in February 2018.   He has minor incontinence but does not wear pads.   I PSS score is 5/1.        IPSS    Row Name 07/14/16 1000         International Prostate Symptom Score   How often have you had the sensation of not emptying your bladder? Not at All     How often have you had to urinate less than every two hours? Less than half the time     How often have you found you stopped and started again several times when you urinated? Not at All     How often have you found it difficult to postpone urination? Not at All     How often have you had a weak urinary stream? Not at All     How often have you had to strain to start urination? Not at All     How many times did you typically get up at night to urinate? 3 Times     Total IPSS Score 5       Quality of Life due to urinary symptoms   If you were to spend the rest of your life with your urinary condition just the way it is now how would you feel about that? Pleased        Score:  1-7 Mild 8-19 Moderate 20-35 Severe  2. Erectile dysfunction SHIM score today is 11.  The patient has been unable to achieve erection since his prostatectomy in June 2015. He has tried Viagra, generic sildenafil, Levitra, and Cialis with no success.   Patient was injected with .5 mL of Trimix and experienced a moderate erection.  His erection was not prolonged or painful.  It did not have a curve.  He has not  used the Trimix, since last visit with [place].  His previous SHIM score was 5.        SHIM    Row Name 07/14/16 1024         SHIM: Over the last 6 months:   How do you rate your confidence that you could get and keep an erection? Very Low     When you had erections with sexual stimulation, how often were your erections hard enough for penetration (entering your partner)? Sometimes (about half the time)     During sexual intercourse, how often were you able to maintain your erection after you had penetrated (entered) your partner? Sometimes (about half the time)     During sexual intercourse, how difficult was it to maintain your erection to completion of intercourse? Difficult     When you attempted sexual intercourse, how often was it satisfactory for you? Almost Never or Never       SHIM Total Score   SHIM 11        Score: 1-7 Severe ED 8-11 Moderate ED 12-16 Mild-Moderate ED 17-21 Mild ED 22-25  No ED   PMH: Past Medical History:  Diagnosis Date  . 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 Decreased libido 01/10/2013  . 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 Elevated prostate specific antigen (PSA) 02/21/2013  . Incomplete bladder emptying 01/10/2013  . Malignant neoplasm of prostate ([place]) 04/28/2013    Surgical History: Past Surgical History:  Procedure Laterality Date  . PROSTATE SURGERY  07/14/2013   Prostatectomy    Home Medications:  Allergies as of 07/14/2016   No Known Allergies     Medication List       Accurate as of 07/14/16 11:08 AM. Always use your most recent med list.          Alprostadil (Vasodilator) 500 MCG Pllt Commonly known as:  MUSE Place the pellet into the ureteral meatus 30 minutes prior to intercourse   atorvastatin 20 MG tablet Commonly known as:  LIPITOR TK 1 T PO QHS   lisinopril-hydrochlorothiazide 20-25 MG tablet Commonly known as:  PRINZIDE,ZESTORETIC Take 1 tablet by mouth daily.   meloxicam 7.5 MG  tablet Commonly known as:  MOBIC TK 1 T PO QAM PRN P   metFORMIN 500 MG tablet Commonly known as:  GLUCOPHAGE Take 500 mg by mouth 2 (two) times daily with a meal.       Allergies: No Known Allergies  Family History: Family History  Problem Relation Age of Onset  . Pancreatic cancer Brother   . Prostate cancer Neg Hx   . Kidney disease Neg Hx     Social History:  reports that he has never smoked. He has never used smokeless tobacco. He reports that he does not drink alcohol or use drugs.  ROS: UROLOGY Frequent Urination?: Yes Hard to postpone urination?: No Burning/pain with urination?: No Get up at night to urinate?: Yes Leakage of urine?: Yes Urine stream starts and stops?: No Trouble starting stream?: No Do you have to strain to urinate?: No Blood in urine?: No Urinary tract infection?: No Sexually transmitted disease?: No Injury to kidneys or bladder?: No Painful intercourse?: No Weak stream?: No Erection problems?: Yes Penile pain?: No  Gastrointestinal Nausea?: No Vomiting?: No Indigestion/heartburn?: No Diarrhea?: No Constipation?: No  Constitutional Fever: No Night sweats?: No Weight loss?: No Fatigue?: No  Skin Skin rash/lesions?: No Itching?: No  Eyes Blurred vision?: No Double vision?: No  Ears/Nose/Throat Sore throat?: No Sinus problems?: No  Hematologic/Lymphatic Swollen glands?: No Easy bruising?: No  Cardiovascular Leg swelling?: No Chest pain?: No  Respiratory Cough?: No Shortness of breath?: No  Endocrine Excessive thirst?: No  Musculoskeletal Back pain?: Yes Joint pain?: Yes  Neurological Headaches?: No Dizziness?: No  Psychologic Depression?: No Anxiety?: No  Physical Exam: BP (!) 171/105   Pulse 100   Ht 6\' 1"  (1.854 m)   Wt 298 lb (135.2 kg)   BMI 39.32 kg/m   Constitutional:  Alert and oriented, No acute distress. HEENT: [place] AT, moist mucus membranes.  Trachea midline, no  masses. Cardiovascular: No clubbing, cyanosis, or edema. Respiratory: Normal respiratory effort, no increased work of breathing. GI: Abdomen is soft, nontender, nondistended, no abdominal masses GU: No CVA tenderness.  No bladder fullness or masses.  Patient with uncircumcised phallus. Foreskin easily retracted.  Vitiligo present.  Urethral meatus is patent.  No penile discharge. No penile lesions or rashes. Scrotum without lesions, cysts, rashes and/or edema.  Testicles are located scrotally bilaterally. No masses are appreciated in the testicles. Left and right epididymis are normal. Rectal: Patient with  normal sphincter tone. Anus and perineum without scarring or rashes. No rectal masses are appreciated. Prostate is surgically absent.  Skin: No rashes, bruises or suspicious lesions. Lymph: No cervical or inguinal adenopathy. Neurologic: Grossly intact, no focal deficits, moving all 4 extremities. Psychiatric: Normal mood and affect.  Laboratory Data: Lab Results  Component Value Date   WBC 8.1 04/21/2012   HGB 12.3 (L) 04/21/2012   HCT 38.6 (L) 04/21/2012   MCV 80 04/21/2012   PLT 300 04/21/2012    Lab Results  Component Value Date   CREATININE 1.51 (H) 04/21/2012      Assessment & Plan:     1. Erectile dysfunction  - SHIM score is 11, it is improving  - I explained to the patient that in order to achieve an erection it takes good functioning of the nervous system (parasympathetic, sympathetic, sensory and motor), good blood flow into the erectile tissue of the penis and a desire to have sex  - I explained that conditions like diabetes, hypertension, coronary artery disease, peripheral vascular disease, smoking, alcohol consumption, age, sleep apnea and BPH can diminish the ability to have an erection  - A recent study published in Sex Med 2018 Apr 13 revealed moderate to vigorous aerobic exercise for 40 minutes 4 times per week can decrease erectile problems caused by physical  inactivity, obesity, hypertension, metabolic syndrome and/or cardiovascular diseases  - We discussed trying intra-urethral suppositories  - He would like to try MUSE  - RTC in 6 months for repeat SHIM score and exam    2. History of prostate cancer Due for PSA in June 2018 - patient will have blood work drawn at his employers  Return in about 6 months (around 01/13/2017) for IPSS, SHIM, PSA and exam.  Zara Council, [facility]  [place] [address] [place], [place] [address] [phone]

## 2016-07-14 ENCOUNTER — Encounter: Payer: Self-pay | Admitting: Urology

## 2016-07-14 ENCOUNTER — Other Ambulatory Visit: Payer: Self-pay

## 2016-07-14 ENCOUNTER — Ambulatory Visit (INDEPENDENT_AMBULATORY_CARE_PROVIDER_SITE_OTHER): Payer: BLUE CROSS/BLUE SHIELD | Admitting: Urology

## 2016-07-14 VITALS — BP 171/105 | HR 100 | Ht 73.0 in | Wt 298.0 lb

## 2016-07-14 DIAGNOSIS — Z8546 Personal history of malignant neoplasm of prostate: Secondary | ICD-10-CM | POA: Diagnosis not present

## 2016-07-14 DIAGNOSIS — N5231 Erectile dysfunction following radical prostatectomy: Secondary | ICD-10-CM

## 2016-07-14 MED ORDER — ALPROSTADIL (VASODILATOR) 500 MCG UR PLLT: PELLET | URETHRAL | 11 refills | Status: DC

## 2016-07-23 ENCOUNTER — Ambulatory Visit: Payer: BLUE CROSS/BLUE SHIELD | Admitting: Dietician

## 2016-07-30 ENCOUNTER — Encounter: Payer: BLUE CROSS/BLUE SHIELD | Attending: Family Medicine | Admitting: Dietician

## 2016-07-30 ENCOUNTER — Encounter: Payer: Self-pay | Admitting: Dietician

## 2016-07-30 VITALS — Ht 73.0 in | Wt 294.3 lb

## 2016-07-30 DIAGNOSIS — E119 Type 2 diabetes mellitus without complications: Secondary | ICD-10-CM | POA: Diagnosis not present

## 2016-07-30 NOTE — Patient Instructions (Signed)
   Plan one "cheat meal" each week when you eat out.   For your other restaurant meals, plan to take 1/2 of the starch and meat home to plan for leftovers, or share a meal with someone, or order form a lower-calorie menu.   Work on decreasing regular Pepsis, try a different variety of diet drink, such as diet Dr. Malachi Bonds, or Diet mt. Dew, etc.   For tea, make with Splenda/ sucralose. At restaurants you can order unsweet and add splenda, or at least order 1/2 and 1/2.

## 2016-07-30 NOTE — Progress Notes (Signed)
Medical Nutrition Therapy: Visit start time: 0900  end time: 0930  Assessment:  Diagnosis: diabetes, obesity Medical history changes: no changes per patient Psychosocial issues/ stress concerns: none  Current weight: 294.3lbs  Height: 6'1" Medications, supplement changes: no changes  Progress and evaluation: Weight gain of 1.3lbs since previous visit on 06/11/16. Patient reports some ups and downs in motivation and time in the past month with both eating habits and exercise; he thinks likely due to some depression. Has improved in the last 2 weeks. He states his girlfriend will also start working on weight loss with him, and he feels more motivated now.    Physical activity: gym 60 minutes, 5-6 days a week  Dietary Intake:  Usual eating pattern includes 2-3 meals and 1-2 snacks per day. Dining out frequency: 2-3 meals per week.  Breakfast: sometimes none due to heat, sometimes sandwich Snack: granola or protein bar Lunch: sandwich and fruit Snack: none Supper: lean broiled or baked meat with salad. Sometimes out -- overeats Snack: cereal; craves sweets -- tries to avoid, but if there is difficult to resist.  Beverages: mostly water, 1 soda daily, mixes with water sometimes  Nutrition Care Education: Topics covered: weight management, diabetes  Weight control: benefits of weight control, strategies for decreasing sugar intake and controlling food portions.  Advanced nutrition: dining out Diabetes:  appropriate carb intake and balance, effects of sugary beverages on blood sugar and weight Other lifestyle changes:  benefits of regular exercise on blood sugar, weight, blood pressure. Etc.  Nutritional Diagnosis:  [place]-2.2 Altered nutrition-related laboratory As related to diabetes, LDL cholesterol, HTN.  As evidenced by lab reports, MD notes. [place]-3.3 Overweight/obesity As related to excess calories.  As evidenced by BMI 38.5, patient report.  Intervention: Discussion as noted  above.   Updated goals with input from patient.    Commended him for recent changes made to increase activity and garner support.    Patient declined scheduling another follow-up at this time, as he feels more motivated to make changes on his own.    Encouraged patient to call with any questions or concerns.  Education Materials given:  Marland Kitchen Goals/ instructions  Learner/ who was taught:  . Patient   Level of understanding: Marland Kitchen Verbalizes/ demonstrates competency  Demonstrated degree of understanding via:   Teach back Learning barriers: . None  Willingness to learn/ readiness for change: . Eager, change in progress  Monitoring and Evaluation:  Dietary intake, exercise, BG control, BP control, lipids, and body weight      follow up: prn

## 2017-01-09 NOTE — Progress Notes (Deleted)
01/12/2017 8:56 AM   Daniel Bradley. 1955/05/14 [id number]  Referring provider: Karen Kitchens, MD [address] [place], [place] [address]  No chief complaint on file.   HPI: The patient is a 61 year old African American gentleman with PMH of gleason 3+4=7 prostate cancer treated with radical prostatectomy in 2015 and ED who presents today for a 6 month follow up.  1. Prostate cancer Treated with radical prostatectomy in 2015. As far as he knows, his PSA has been undetectable - most recently in February 2018.   He has minor incontinence but does not wear pads.   I PSS score is ***.  His previous I PSS was 5/1.      Score:  1-7 Mild 8-19 Moderate 20-35 Severe  2. Erectile dysfunction SHIM score today is ***.  The patient has been unable to achieve erection since his prostatectomy in June 2015. He has tried Viagra, generic sildenafil, Levitra, and Cialis with no success.   Patient was injected with .5 mL of Trimix and experienced a moderate erection.  His erection was not prolonged or painful.  It did not have a curve.  He has not used the Trimix, since last visit with [place].  His previous SHIM score was 11.      Score: 1-7 Severe ED 8-11 Moderate ED 12-16 Mild-Moderate ED 17-21 Mild ED 22-25 No ED   PMH: Past Medical History:  Diagnosis Date  . 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 Decreased libido 01/10/2013  . 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 Elevated prostate specific antigen (PSA) 02/21/2013  . Incomplete bladder emptying 01/10/2013  . Malignant neoplasm of prostate ([place]) 04/28/2013    Surgical History: Past Surgical History:  Procedure Laterality Date  . PROSTATE SURGERY  07/14/2013   Prostatectomy    Home Medications:  Allergies as of 01/12/2017   No Known Allergies     Medication List        Accurate as of 01/09/17  8:56 AM. Always use your most recent med list.          Alprostadil (Vasodilator) 500 MCG  Pllt Commonly known as:  MUSE Place the pellet into the ureteral meatus 30 minutes prior to intercourse   atorvastatin 20 MG tablet Commonly known as:  LIPITOR TK 1 T PO QHS   lisinopril-hydrochlorothiazide 20-25 MG tablet Commonly known as:  PRINZIDE,ZESTORETIC Take 1 tablet by mouth daily.   meloxicam 7.5 MG tablet Commonly known as:  MOBIC TK 1 T PO QAM PRN P   metFORMIN 500 MG tablet Commonly known as:  GLUCOPHAGE Take 500 mg by mouth 2 (two) times daily with a meal.       Allergies: No Known Allergies  Family History: Family History  Problem Relation Age of Onset  . Pancreatic cancer Brother   . Prostate cancer Neg Hx   . Kidney disease Neg Hx     Social History:  reports that  has never smoked. he has never used smokeless tobacco. He reports that he does not drink alcohol or use drugs.  ROS:                                        Physical Exam: There were no vitals taken for this visit.  Constitutional:  Alert and oriented, No acute distress. HEENT: [place] AT, moist mucus membranes.  Trachea midline, no masses. Cardiovascular: No clubbing, cyanosis,  or edema. Respiratory: Normal respiratory effort, no increased work of breathing. GI: Abdomen is soft, nontender, nondistended, no abdominal masses GU: No CVA tenderness.  No bladder fullness or masses.  Patient with uncircumcised phallus. Foreskin easily retracted.  Vitiligo present.  Urethral meatus is patent.  No penile discharge. No penile lesions or rashes. Scrotum without lesions, cysts, rashes and/or edema.  Testicles are located scrotally bilaterally. No masses are appreciated in the testicles. Left and right epididymis are normal. Rectal: Patient with  normal sphincter tone. Anus and perineum without scarring or rashes. No rectal masses are appreciated. Prostate is surgically absent.  Skin: No rashes, bruises or suspicious lesions. Lymph: No cervical or inguinal adenopathy. Neurologic:  Grossly intact, no focal deficits, moving all 4 extremities. Psychiatric: Normal mood and affect.  Laboratory Data: PSA History  <0.1 ng/mL in 03/2016      Assessment & Plan:     1. Erectile dysfunction  - SHIM score is 11, it is improving  - I explained to the patient that in order to achieve an erection it takes good functioning of the nervous system (parasympathetic, sympathetic, sensory and motor), good blood flow into the erectile tissue of the penis and a desire to have sex  - I explained that conditions like diabetes, hypertension, coronary artery disease, peripheral vascular disease, smoking, alcohol consumption, age, sleep apnea and BPH can diminish the ability to have an erection  - A recent study published in Sex Med 2018 Apr 13 revealed moderate to vigorous aerobic exercise for 40 minutes 4 times per week can decrease erectile problems caused by physical inactivity, obesity, hypertension, metabolic syndrome and/or cardiovascular diseases  - We discussed trying intra-urethral suppositories  - He would like to try MUSE  - RTC in 6 months for repeat SHIM score and exam    2. History of prostate cancer Due for PSA in June 2018 - patient will have blood work drawn at his employers  No Follow-up on file.  Zara Council, [facility] [address], [place], [place] [address] [phone]

## 2017-01-12 ENCOUNTER — Ambulatory Visit: Payer: PRIVATE HEALTH INSURANCE | Admitting: Urology

## 2017-01-12 ENCOUNTER — Encounter: Payer: Self-pay | Admitting: Urology

## 2017-02-10 DIAGNOSIS — I219 Acute myocardial infarction, unspecified: Secondary | ICD-10-CM

## 2017-02-10 HISTORY — DX: Acute myocardial infarction, unspecified: I21.9

## 2017-08-19 ENCOUNTER — Other Ambulatory Visit: Payer: Self-pay

## 2017-08-19 ENCOUNTER — Inpatient Hospital Stay
Admit: 2017-08-19 | Discharge: 2017-08-19 | Disposition: A | Discharge status: Home / Self Care | Payer: BLUE CROSS/BLUE SHIELD | Attending: Cardiology | Admitting: Cardiology

## 2017-08-19 ENCOUNTER — Encounter: Payer: Self-pay | Admitting: Internal Medicine

## 2017-08-19 ENCOUNTER — Emergency Department: Payer: BLUE CROSS/BLUE SHIELD

## 2017-08-19 ENCOUNTER — Inpatient Hospital Stay
Admission: EM | Admit: 2017-08-19 | Discharge: 2017-08-21 | DRG: 247 | Disposition: A | Discharge status: Home / Self Care | Payer: BLUE CROSS/BLUE SHIELD | Attending: Internal Medicine | Admitting: Internal Medicine

## 2017-08-19 DIAGNOSIS — I214 Non-ST elevation (NSTEMI) myocardial infarction: Secondary | ICD-10-CM | POA: Diagnosis present

## 2017-08-19 DIAGNOSIS — E785 Hyperlipidemia, unspecified: Secondary | ICD-10-CM | POA: Diagnosis present

## 2017-08-19 DIAGNOSIS — Z7984 Long term (current) use of oral hypoglycemic drugs: Secondary | ICD-10-CM

## 2017-08-19 DIAGNOSIS — C61 Malignant neoplasm of prostate: Secondary | ICD-10-CM | POA: Diagnosis present

## 2017-08-19 DIAGNOSIS — R072 Precordial pain: Secondary | ICD-10-CM

## 2017-08-19 DIAGNOSIS — Z79899 Other long term (current) drug therapy: Secondary | ICD-10-CM

## 2017-08-19 DIAGNOSIS — I10 Essential (primary) hypertension: Secondary | ICD-10-CM | POA: Diagnosis present

## 2017-08-19 DIAGNOSIS — Z791 Long term (current) use of non-steroidal anti-inflammatories (NSAID): Secondary | ICD-10-CM | POA: Diagnosis not present

## 2017-08-19 DIAGNOSIS — E119 Type 2 diabetes mellitus without complications: Secondary | ICD-10-CM | POA: Diagnosis present

## 2017-08-19 DIAGNOSIS — I252 Old myocardial infarction: Secondary | ICD-10-CM | POA: Diagnosis present

## 2017-08-19 LAB — APTT: aPTT: 25 seconds (ref 24–36)

## 2017-08-19 LAB — CBC
HCT: 41.7 % (ref 40.0–52.0)
Hemoglobin: 13.6 g/dL (ref 13.0–18.0)
MCH: 25.6 pg — ABNORMAL LOW (ref 26.0–34.0)
MCHC: 32.7 g/dL (ref 32.0–36.0)
MCV: 78.3 fL — ABNORMAL LOW (ref 80.0–100.0)
Platelets: 270 10*3/uL (ref 150–440)
RBC: 5.32 MIL/uL (ref 4.40–5.90)
RDW: 15.4 % — ABNORMAL HIGH (ref 11.5–14.5)
WBC: 7.2 10*3/uL (ref 3.8–10.6)

## 2017-08-19 LAB — BASIC METABOLIC PANEL
Anion gap: 11 (ref 5–15)
BUN: 20 mg/dL (ref 8–23)
CO2: 24 mmol/L (ref 22–32)
Calcium: 9.9 mg/dL (ref 8.9–10.3)
Chloride: 108 mmol/L (ref 98–111)
Creatinine, Ser: 1.03 mg/dL (ref 0.61–1.24)
GFR calc Af Amer: >60 mL/min (ref >60)
GFR calc non Af Amer: >60 mL/min (ref >60)
Glucose, Bld: 161 mg/dL — ABNORMAL HIGH (ref 70–99)
Potassium: 3.6 mmol/L (ref 3.5–5.1)
Sodium: 143 mmol/L (ref 135–145)

## 2017-08-19 LAB — TROPONIN I
Troponin I: 0.11 ng/mL (ref <0.03)
Troponin I: 0.72 ng/mL (ref <0.03)
Troponin I: 4.94 ng/mL (ref <0.03)

## 2017-08-19 LAB — PROTIME-INR
INR: 0.97
Prothrombin Time: 12.8 seconds (ref 11.4–15.2)

## 2017-08-19 LAB — HEPARIN LEVEL (UNFRACTIONATED): Heparin Unfractionated: 0.64 IU/mL (ref 0.30–0.70)

## 2017-08-19 MED ORDER — SODIUM CHLORIDE 0.9% FLUSH: 3.0000 mL | INTRAVENOUS | Status: DC | PRN

## 2017-08-19 MED ORDER — SODIUM CHLORIDE 0.9 % WEIGHT BASED INFUSION: 3.0000 mL/kg/h | INTRAVENOUS | Status: AC

## 2017-08-19 MED ORDER — SODIUM CHLORIDE 0.9 % IV BOLUS
1000.0000 mL | Freq: Once | INTRAVENOUS | Status: AC
  Administered 2017-08-19: 1000 mL via INTRAVENOUS

## 2017-08-19 MED ORDER — ASPIRIN 81 MG PO CHEW: 81.0000 mg | CHEWABLE_TABLET | ORAL | Status: DC

## 2017-08-19 MED ORDER — HEPARIN BOLUS VIA INFUSION
4000.0000 [IU] | Freq: Once | INTRAVENOUS | Status: AC
  Administered 2017-08-19: 4000 [IU] via INTRAVENOUS
  Filled 2017-08-19: qty 4000

## 2017-08-19 MED ORDER — SODIUM CHLORIDE 0.9 % IV SOLN: 250.0000 mL | INTRAVENOUS | Status: DC | PRN

## 2017-08-19 MED ORDER — HEPARIN (PORCINE) IN NACL 100-0.45 UNIT/ML-% IJ SOLN
1500.0000 [IU]/h | INTRAMUSCULAR | Status: DC
Start: 2017-08-19 — End: 2017-08-20
  Administered 2017-08-19: 1500 [IU]/h via INTRAVENOUS
  Filled 2017-08-19 (×2): qty 250

## 2017-08-19 MED ORDER — DIPHENHYDRAMINE HCL 25 MG PO CAPS
25.0000 mg | ORAL_CAPSULE | Freq: Every evening | ORAL | Status: DC | PRN
  Administered 2017-08-19 – 2017-08-20 (×2): 25 mg via ORAL
  Filled 2017-08-19 (×2): qty 1

## 2017-08-19 MED ORDER — ASPIRIN 81 MG PO CHEW
324.0000 mg | CHEWABLE_TABLET | Freq: Once | ORAL | Status: AC
  Administered 2017-08-19: 324 mg via ORAL
  Filled 2017-08-19: qty 4

## 2017-08-19 MED ORDER — NITROGLYCERIN 0.4 MG SL SUBL
0.4000 mg | SUBLINGUAL_TABLET | SUBLINGUAL | Status: DC | PRN
  Administered 2017-08-19 (×3): 0.4 mg via SUBLINGUAL
  Filled 2017-08-19: qty 1

## 2017-08-19 MED ORDER — ACETAMINOPHEN 325 MG PO TABS
650.0000 mg | ORAL_TABLET | ORAL | Status: DC | PRN
  Administered 2017-08-20: 650 mg via ORAL
  Filled 2017-08-19 (×3): qty 2

## 2017-08-19 MED ORDER — ONDANSETRON HCL 4 MG/2ML IJ SOLN: 4.0000 mg | Freq: Four times a day (QID) | INTRAMUSCULAR | Status: DC | PRN

## 2017-08-19 MED ORDER — PERFLUTREN LIPID MICROSPHERE
1.0000 mL | INTRAVENOUS | Status: AC | PRN
  Administered 2017-08-19: 2 mL via INTRAVENOUS
  Filled 2017-08-19: qty 10

## 2017-08-19 MED ORDER — ASPIRIN 300 MG RE SUPP: 300.0000 mg | RECTAL | Status: AC

## 2017-08-19 MED ORDER — SODIUM CHLORIDE 0.9% FLUSH
3.0000 mL | Freq: Two times a day (BID) | INTRAVENOUS | Status: DC
  Administered 2017-08-19: 3 mL via INTRAVENOUS

## 2017-08-19 MED ORDER — ONDANSETRON HCL 4 MG/2ML IJ SOLN
4.0000 mg | Freq: Once | INTRAMUSCULAR | Status: AC
  Administered 2017-08-19: 4 mg via INTRAVENOUS
  Filled 2017-08-19: qty 2

## 2017-08-19 MED ORDER — SODIUM CHLORIDE 0.9 % WEIGHT BASED INFUSION
1.0000 mL/kg/h | INTRAVENOUS | Status: DC
  Administered 2017-08-20: 1 mL/kg/h via INTRAVENOUS

## 2017-08-19 MED ORDER — MORPHINE SULFATE (PF) 2 MG/ML IV SOLN
2.0000 mg | INTRAVENOUS | Status: DC | PRN
Start: 2017-08-19 — End: 2017-08-21
  Administered 2017-08-19 – 2017-08-20 (×3): 2 mg via INTRAVENOUS
  Filled 2017-08-19 (×2): qty 1

## 2017-08-19 MED ORDER — LISINOPRIL 20 MG PO TABS
20.0000 mg | ORAL_TABLET | Freq: Every day | ORAL | Status: DC
Start: 2017-08-19 — End: 2017-08-21
  Administered 2017-08-19 – 2017-08-21 (×3): 20 mg via ORAL
  Filled 2017-08-19 (×3): qty 1

## 2017-08-19 MED ORDER — SODIUM CHLORIDE 0.9% FLUSH
3.0000 mL | Freq: Two times a day (BID) | INTRAVENOUS | Status: DC
  Administered 2017-08-20: 3 mL via INTRAVENOUS

## 2017-08-19 MED ORDER — NITROGLYCERIN IN D5W 200-5 MCG/ML-% IV SOLN
0.0000 ug/min | INTRAVENOUS | Status: DC
  Administered 2017-08-19: 5 ug/min via INTRAVENOUS
  Administered 2017-08-19: 10 ug/min via INTRAVENOUS
  Administered 2017-08-19: 5 ug/min via INTRAVENOUS
  Filled 2017-08-19: qty 250

## 2017-08-19 MED ORDER — ASPIRIN 81 MG PO CHEW: 324.0000 mg | CHEWABLE_TABLET | ORAL | Status: AC

## 2017-08-19 MED ORDER — HYDROCHLOROTHIAZIDE 25 MG PO TABS
25.0000 mg | ORAL_TABLET | Freq: Every day | ORAL | Status: DC
  Administered 2017-08-19 – 2017-08-21 (×3): 25 mg via ORAL
  Filled 2017-08-19 (×3): qty 1

## 2017-08-19 MED ORDER — NITROGLYCERIN 0.4 MG SL SUBL: 0.4000 mg | SUBLINGUAL_TABLET | SUBLINGUAL | Status: DC | PRN

## 2017-08-19 MED ORDER — ATORVASTATIN CALCIUM 20 MG PO TABS
20.0000 mg | ORAL_TABLET | Freq: Every day | ORAL | Status: DC
  Administered 2017-08-19: 20 mg via ORAL
  Filled 2017-08-19: qty 1

## 2017-08-19 MED ORDER — METOPROLOL TARTRATE 25 MG PO TABS
25.0000 mg | ORAL_TABLET | Freq: Two times a day (BID) | ORAL | Status: DC
  Administered 2017-08-19 – 2017-08-21 (×4): 25 mg via ORAL
  Filled 2017-08-19 (×4): qty 1

## 2017-08-19 MED ORDER — LISINOPRIL-HYDROCHLOROTHIAZIDE 20-25 MG PO TABS: 1.0000 | ORAL_TABLET | Freq: Every day | ORAL | Status: DC

## 2017-08-19 MED ORDER — MORPHINE SULFATE (PF) 4 MG/ML IV SOLN
4.0000 mg | Freq: Once | INTRAVENOUS | Status: AC
Start: 2017-08-19 — End: 2017-08-19
  Administered 2017-08-19: 4 mg via INTRAVENOUS
  Filled 2017-08-19: qty 1

## 2017-08-19 MED ORDER — ASPIRIN EC 81 MG PO TBEC
81.0000 mg | DELAYED_RELEASE_TABLET | Freq: Every day | ORAL | Status: DC
  Administered 2017-08-20 – 2017-08-21 (×2): 81 mg via ORAL
  Filled 2017-08-19 (×2): qty 1

## 2017-08-19 NOTE — Plan of Care (Signed)
  Problem: Education: Goal: Knowledge of General Education information will improve Outcome: Progressing   Problem: Activity: Goal: Risk for activity intolerance will decrease Outcome: Progressing   Problem: Nutrition: Goal: Adequate nutrition will be maintained Outcome: Progressing   Problem: Coping: Goal: Level of anxiety will decrease Outcome: Progressing   Problem: Education: Goal: Understanding of CV disease, CV risk reduction, and recovery process will improve Outcome: Progressing

## 2017-08-19 NOTE — ED Provider Notes (Signed)
[facility] Emergency Department Provider Note  ____________________________________________  Time seen: Approximately 1:56 PM  I have reviewed the triage vital signs and the nursing notes.   HISTORY  Chief Complaint Chest Pain    HPI Daniel Bradley. is a 62 y.o. male with a history of prostate cancer, hypertension, hyperlipidemia who comes to the ED complaining of  central chest pain described as tightness, 10/10, nonradiating.  Started suddenly while patient was on his way to work today and has been persistent and worsening.  Associated with vomiting and diaphoresis.  No shortness of breath.  Never had pain like this before.  No recent exertional symptoms.  No aggravating or alleviating factors at present.     Past Medical History:  Diagnosis Date  . 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 Decreased libido 01/10/2013  . 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 Elevated prostate specific antigen (PSA) 02/21/2013  . Incomplete bladder emptying 01/10/2013  . Malignant neoplasm of prostate ([place]) 04/28/2013     Patient Active Problem List   Diagnosis Date Noted  . 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 Malignant neoplasm of prostate ([place]) 04/28/2013  . Elevated prostate specific antigen (PSA) 02/21/2013  . Decreased libido 01/10/2013  . 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 Incomplete bladder emptying 01/10/2013     Past Surgical History:  Procedure Laterality Date  . PROSTATE SURGERY  07/14/2013   Prostatectomy     Prior to Admission medications   Medication Sig Start Date End Date Taking? Authorizing Provider  Alprostadil, Vasodilator, (MUSE) 500 MCG PLLT Place the pellet into the ureteral meatus 30 minutes prior to intercourse 07/14/16   Zara Council A, PA-C  atorvastatin (LIPITOR) 20 MG tablet TK 1 T PO QHS 04/29/16   [provider]  lisinopril-hydrochlorothiazide (PRINZIDE,ZESTORETIC)  20-25 MG tablet Take 1 tablet by mouth daily.    [provider]  meloxicam (MOBIC) 7.5 MG tablet TK 1 T PO QAM PRN P 04/25/15   [provider]  metFORMIN (GLUCOPHAGE) 500 MG tablet Take 500 mg by mouth 2 (two) times daily with a meal.    [provider]     Allergies Patient has no known allergies.   Family History  Problem Relation Age of Onset  . Pancreatic cancer Brother   . Prostate cancer Neg Hx   . Kidney disease Neg Hx     Social History Social History   Tobacco Use  . Smoking status: Never Smoker  . Smokeless tobacco: Never Used  Substance Use Topics  . Alcohol use: No    Alcohol/week: 0.0 oz  . Drug use: No    Review of Systems  Constitutional:   No fever or chills.  ENT:   No sore throat. No rhinorrhea. Cardiovascular: Positive as above chest pain without syncope. Respiratory:   No dyspnea or cough. Gastrointestinal:   Negative for abdominal pain, vomiting and diarrhea.  Musculoskeletal:   Negative for focal pain or swelling All other systems reviewed and are negative except as documented above in ROS and HPI.  ____________________________________________   PHYSICAL EXAM:  VITAL SIGNS: ED Triage Vitals  Enc Vitals Group     BP 08/19/17 1223 (!) 144/84     Pulse Rate 08/19/17 1223 81     Resp 08/19/17 1223 (!) 24     Temp 08/19/17 1223 98.4 F (36.9 C)     Temp Source 08/19/17 1223 Oral     SpO2 08/19/17 1223 95 %  Weight 08/19/17 1215 295 lb (133.8 kg)     Height 08/19/17 1215 6\' 1"  (1.854 m)     Head Circumference --      Peak Flow --      Pain Score 08/19/17 1215 10     Pain Loc --      Pain Edu? --      Excl. in [place]? --     Vital signs reviewed, nursing assessments reviewed.   Constitutional:   Alert and oriented.  Uncomfortable. Eyes:   Conjunctivae are normal. EOMI. PERRL. ENT      Head:   Normocephalic and atraumatic.      Nose:   No congestion/rhinnorhea.       Mouth/Throat:   MMM, no pharyngeal  erythema. No peritonsillar mass.       Neck:   No meningismus. Full ROM. Hematological/Lymphatic/Immunilogical:   No cervical lymphadenopathy. Cardiovascular:   RRR. Symmetric bilateral radial and DP pulses.  No murmurs.  Respiratory:   Normal respiratory effort without tachypnea/retractions. Breath sounds are clear and equal bilaterally. No wheezes/rales/rhonchi. Gastrointestinal:   Soft and nontender. Non distended. There is no CVA tenderness.  No rebound, rigidity, or guarding. Musculoskeletal:   Normal range of motion in all extremities. No joint effusions.  No lower extremity tenderness.  No edema. Neurologic:   Normal speech and language.  Motor grossly intact. No acute focal neurologic deficits are appreciated.  Skin:    Skin is warm, dry and intact. No rash noted.  No petechiae, purpura, or bullae.  ____________________________________________    LABS (pertinent positives/negatives) (all labs ordered are listed, but only abnormal results are displayed) Labs Reviewed  BASIC METABOLIC PANEL - Abnormal; Notable for the following components:      Result Value   Glucose, Bld 161 (*)    All other components within normal limits  CBC - Abnormal; Notable for the following components:   MCV 78.3 (*)    MCH 25.6 (*)    RDW 15.4 (*)    All other components within normal limits  TROPONIN I - Abnormal; Notable for the following components:   Troponin I 0.11 (*)    All other components within normal limits  APTT  PROTIME-INR   ____________________________________________   EKG  Interpreted by me Sinus rhythm rate of 88, normal axis and intervals.  Normal QRS.  1 mm ST depression in anterior leads, no ST elevation.  Normal T waves.  Repeat EKG performed at 12:28 PM, sinus rhythm rate of 79.  Persistent ST depression in anterior leads, some additional ST depression in inferior leads.  No STEMI.  ____________________________________________    RADIOLOGY  Dg Chest 2  View  Result Date: 08/19/2017 CLINICAL DATA:  Chest tightness EXAM: CHEST - 2 VIEW COMPARISON:  None. FINDINGS: Lungs are clear. Heart size and pulmonary vascularity are normal. No adenopathy. No pneumothorax. There is degenerative change in the thoracic spine. IMPRESSION: No edema or consolidation. Electronically Signed   By: Lowella Grip III M.D.   On: 08/19/2017 13:13    ____________________________________________   PROCEDURES .Critical Care Performed by: Carrie Mew, MD Authorized by: Carrie Mew, MD   Critical care provider statement:    Critical care time (minutes):  35   Critical care time was exclusive of:  Separately billable procedures and treating other patients   Critical care was necessary to treat or prevent imminent or life-threatening deterioration of the following conditions:  Cardiac failure   Critical care was time spent personally by me on  the following activities:  Development of treatment plan with patient or surrogate, discussions with consultants, evaluation of patient's response to treatment, examination of patient, obtaining history from patient or surrogate, ordering and performing treatments and interventions, ordering and review of laboratory studies, ordering and review of radiographic studies, pulse oximetry, re-evaluation of patient's condition and review of old charts    ____________________________________________   CLINICAL IMPRESSION / [place] / ED COURSE  Pertinent labs & imaging results that were available during my care of the patient were reviewed by me and considered in my medical decision making (see chart for details).    Patient presents with severe chest pain, has risk factors for cardiac disease.  EKG shows some ST depression concerning for non-STEMI.  Aspirin and nitroglycerin while waiting for labs.  Clinical Course as of Aug 20 1354  Wed Aug 19, 2017  1314 C/w nstemi. Will start heparin.  Troponin I(!!):  0.11 [PS]  1340 Case discussed with cardiology Dr. Ubaldo Glassing, agrees with current management and admission.   [PS]    Clinical Course User Index [PS] Carrie Mew, MD     ____________________________________________   FINAL CLINICAL IMPRESSION(S) / ED DIAGNOSES    Final diagnoses:  Precordial pain  NSTEMI (non-ST elevated myocardial infarction) [facility])     ED Discharge Orders    None      Portions of this note were generated with dragon dictation software. Dictation errors may occur despite best attempts at proofreading.    Carrie Mew, MD 08/19/17 [phone]

## 2017-08-19 NOTE — ED Triage Notes (Signed)
Patient diaphoretic and nauseous in triage.  Patient reports chest tightness.  Patient states pain radiates into his arm.  Patient appears pale.  Patient states he has had some indigestion.  Patient vomiting during triage.  Patient reports chest pain began this morning.  Denies history of heart attack.

## 2017-08-19 NOTE — H&P (Signed)
[facility] [place] NAME: Daniel Bradley    MR#:  [id number]  DATE OF BIRTH:  1955/08/10  DATE OF ADMISSION:  08/19/2017  PRIMARY CARE PHYSICIAN: Karen Kitchens, MD   REQUESTING/REFERRING PHYSICIAN:   CHIEF COMPLAINT:   Chief Complaint  Patient presents with  . Chest Pain    HISTORY OF PRESENT ILLNESS: Daniel Bradley  is a 62 y.o. male with a known history of erectile dysfunction, prostate cancer presented to the emergency room with chest pain.  Chest pain started this morning around 10 AM.  Pain is located in the retrosternal area non radiating.  The chest pain is sharp in nature 10 out of 10 on a scale of 1-10 initially.  Patient received aspirin and nitrates in the emergency room troponin is elevated and EKG showed ST depression.  Patient started on IV heparin drip and was seen by cardiology in the emergency room.  Plan is for cardiac cath in the morning.  PAST MEDICAL HISTORY:   Past Medical History:  Diagnosis Date  . 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 Decreased libido 01/10/2013  . 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 Elevated prostate specific antigen (PSA) 02/21/2013  . Incomplete bladder emptying 01/10/2013  . Malignant neoplasm of prostate ([place]) 04/28/2013    PAST SURGICAL HISTORY:  Past Surgical History:  Procedure Laterality Date  . PROSTATE SURGERY  07/14/2013   Prostatectomy    SOCIAL HISTORY:  Social History   Tobacco Use  . Smoking status: Never Smoker  . Smokeless tobacco: Never Used  Substance Use Topics  . Alcohol use: No    Alcohol/week: 0.0 oz    FAMILY HISTORY:  Family History  Problem Relation Age of Onset  . Pancreatic cancer Brother   . Prostate cancer Neg Hx   . Kidney disease Neg Hx     DRUG ALLERGIES: No Known Allergies  REVIEW OF SYSTEMS:   CONSTITUTIONAL: No fever, fatigue or weakness.  EYES: No blurred or double vision.  EARS, NOSE, AND THROAT:  No tinnitus or ear pain.  RESPIRATORY: No cough, shortness of breath, wheezing or hemoptysis.  CARDIOVASCULAR: Has chest pain, no orthopnea, edema.  GASTROINTESTINAL: No nausea, vomiting, diarrhea or abdominal pain.  GENITOURINARY: No dysuria, hematuria.  ENDOCRINE: No polyuria, nocturia,  HEMATOLOGY: No anemia, easy bruising or bleeding SKIN: No rash or lesion. MUSCULOSKELETAL: No joint pain or arthritis.   NEUROLOGIC: No tingling, numbness, weakness.  PSYCHIATRY: No anxiety or depression.   MEDICATIONS AT HOME:  Prior to Admission medications   Medication Sig Start Date End Date Taking? Authorizing Provider  aspirin EC 81 MG tablet Take 81 mg by mouth daily.   Yes [provider]  atorvastatin (LIPITOR) 20 MG tablet Take 20 mg by mouth at bedtime 04/29/16  Yes [provider]  meloxicam (MOBIC) 15 MG tablet Take 15 mg by mouth daily. 07/26/17  Yes [provider]  metFORMIN (GLUCOPHAGE) 500 MG tablet Take 500 mg by mouth 2 (two) times daily with a meal.   Yes [provider]  Alprostadil, Vasodilator, (MUSE) 500 MCG PLLT Place the pellet into the ureteral meatus 30 minutes prior to intercourse 07/14/16   Zara Council A, PA-C  lisinopril-hydrochlorothiazide (PRINZIDE,ZESTORETIC) 20-25 MG tablet Take 1 tablet by mouth daily.    [provider]      PHYSICAL EXAMINATION:   VITAL SIGNS: Blood pressure (!) 143/84, pulse 86, temperature 98.4 F (36.9 C), temperature source Oral, resp.  rate (!) 26, height 6\' 1"  (1.854 m), weight 133.8 kg (295 lb), SpO2 92 %.  GENERAL:  62 y.o.-year-old patient lying in the bed with no acute distress.  EYES: Pupils equal, round, reactive to light and accommodation. No scleral icterus. Extraocular muscles intact.  HEENT: Head atraumatic, normocephalic. Oropharynx and nasopharynx clear.  NECK:  Supple, no jugular venous distention. No thyroid enlargement, no tenderness.  LUNGS: Normal breath sounds bilaterally, no  wheezing, rales,rhonchi or crepitation. No use of accessory muscles of respiration.  CARDIOVASCULAR: S1, S2 normal. No murmurs, rubs, or gallops.  ABDOMEN: Soft, nontender, nondistended. Bowel sounds present. No organomegaly or mass.  EXTREMITIES: No pedal edema, cyanosis, or clubbing.  NEUROLOGIC: Cranial nerves II through XII are intact. Muscle strength 5/5 in all extremities. Sensation intact. Gait not checked.  PSYCHIATRIC: The patient is alert and oriented x 3.  SKIN: No obvious rash, lesion, or ulcer.   LABORATORY PANEL:   CBC Recent Labs  Lab 08/19/17 1217  WBC 7.2  HGB 13.6  HCT 41.7  PLT 270  MCV 78.3*  MCH 25.6*  MCHC 32.7  RDW 15.4*   ------------------------------------------------------------------------------------------------------------------  Chemistries  Recent Labs  Lab 08/19/17 1217  NA 143  K 3.6  CL 108  CO2 24  GLUCOSE 161*  BUN 20  CREATININE 1.03  CALCIUM 9.9   ------------------------------------------------------------------------------------------------------------------ estimated creatinine clearance is 108.1 mL/min (by C-G formula based on SCr of 1.03 mg/dL). ------------------------------------------------------------------------------------------------------------------ No results for input(s): TSH, T4TOTAL, T3FREE, THYROIDAB in the last 72 hours.  Invalid input(s): FREET3   Coagulation profile Recent Labs  Lab 08/19/17 1341  INR 0.97   ------------------------------------------------------------------------------------------------------------------- No results for input(s): DDIMER in the last 72 hours. -------------------------------------------------------------------------------------------------------------------  Cardiac Enzymes Recent Labs  Lab 08/19/17 1217  TROPONINI 0.11*   ------------------------------------------------------------------------------------------------------------------ Invalid input(s):  POCBNP  ---------------------------------------------------------------------------------------------------------------  Urinalysis    Component Value Date/Time   COLORURINE Yellow 07/22/2013 1020   APPEARANCEUR Cloudy 07/22/2013 1020   LABSPEC 1.017 07/22/2013 1020   PHURINE 7.0 07/22/2013 1020   GLUCOSEU Negative 07/22/2013 1020   HGBUR 3+ 07/22/2013 1020   BILIRUBINUR Negative 07/22/2013 1020   KETONESUR Negative 07/22/2013 1020   PROTEINUR 30 mg/dL 07/22/2013 1020   NITRITE Negative 07/22/2013 1020   LEUKOCYTESUR 3+ 07/22/2013 1020     RADIOLOGY: Dg Chest 2 View  Result Date: 08/19/2017 CLINICAL DATA:  Chest tightness EXAM: CHEST - 2 VIEW COMPARISON:  None. FINDINGS: Lungs are clear. Heart size and pulmonary vascularity are normal. No adenopathy. No pneumothorax. There is degenerative change in the thoracic spine. IMPRESSION: No edema or consolidation. Electronically Signed   By: Lowella Grip III M.D.   On: 08/19/2017 13:13    EKG: Orders placed or performed during the hospital encounter of 08/19/17  . EKG 12-Lead  . EKG 12-Lead  . ED EKG within 10 minutes  . ED EKG within 10 minutes    IMPRESSION AND PLAN:  62 year old male patient with history of malignant neoplasm of the prostate, erectile dysfunction presented to emergency room with chest pain  -Non-ST elevation MI Admit patient to telemetry inpatient service  IV heparin drip for anticoagulation Start patient on aspirin, statin and beta-blocker  cardiology consultation and cardiac cath in a.m. Check echocardiogram   -Malignant neoplasm of prostate Supportive care  - DVT prophylaxis On anticoagulation with heparin drip   All the records are reviewed and case discussed with ED provider. Management plans discussed with the patient, family and they are in agreement.  CODE STATUS:Full code  TOTAL TIME TAKING CARE OF THIS PATIENT: 53 minutes.    Saundra Shelling M.D on 08/19/2017 at 2:37  PM  Between 7am to 6pm - Pager - [phone]  After 6pm go to [email or web address] - password [email or web address] [facility]  [facility]  Office  [phone]  CC: Primary care physician; Karen Kitchens, MD

## 2017-08-19 NOTE — Progress Notes (Signed)
Report received from Elder Love in the ED.

## 2017-08-19 NOTE — Progress Notes (Signed)
ANTICOAGULATION CONSULT NOTE  Pharmacy Consult for Heparin Indication: chest pain/ACS  No Known Allergies  Patient Measurements: Height: 6\' 1"  (185.4 cm) Weight: 295 lb (133.8 kg) IBW/kg (Calculated) : 79.9 Heparin Dosing Weight: 110 kg  Vital Signs: Temp: 98.4 F (36.9 C) (07/10 1223) Temp Source: Oral (07/10 1223) BP: 135/87 (07/10 1319) Pulse Rate: 82 (07/10 1319)  Labs: Recent Labs    08/19/17 1217  HGB 13.6  HCT 41.7  PLT 270  CREATININE 1.03  TROPONINI 0.11*    Estimated Creatinine Clearance: 108.1 mL/min (by C-G formula based on SCr of 1.03 mg/dL).   Medical History: Past Medical History:  Diagnosis Date  . 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 Decreased libido 01/10/2013  . 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 Elevated prostate specific antigen (PSA) 02/21/2013  . Incomplete bladder emptying 01/10/2013  . Malignant neoplasm of prostate ([place]) 04/28/2013    62 y/o M with no h/o anticoagulant use admitted with ACS to begin heparin infusion.   Goal of Therapy:  Heparin level 0.3-0.7 units/ml Monitor platelets by anticoagulation protocol: Yes  Assessment/Plan Give 4000 units bolus x 1 Start heparin infusion at 1500 units/hr Check anti-Xa level in 6 hours and daily while on heparin Continue to monitor H&H and platelets  Ulice Dash D 08/19/2017,1:26 PM

## 2017-08-19 NOTE — Progress Notes (Signed)
Advanced care plan.  Purpose of the Encounter: CODE STATUS  Parties in Attendance: Patient  Patient's Decision Capacity: Good  Subjective/Patient's story: Presented to emergency room with chest pain   Objective/Medical story Has elevated troponin non-ST elevation MI   Goals of care determination:  Advance care directives and goals of care discussed with the patient Patient wants everything done which includes cardiac resuscitation, intubation and ventilator if the need arises   CODE STATUS: Full code   Time spent discussing advanced care planning: 16 minutes

## 2017-08-19 NOTE — Progress Notes (Signed)
ANTICOAGULATION CONSULT NOTE  Pharmacy Consult for Heparin Indication: chest pain/ACS  No Known Allergies  Patient Measurements: Height: 6\' 1"  (185.4 cm) Weight: 293 lb 14.4 oz (133.3 kg) IBW/kg (Calculated) : 79.9 Heparin Dosing Weight: 110 kg  Vital Signs: Temp: 98.8 F (37.1 C) (07/10 1929) Temp Source: Oral (07/10 1929) BP: 152/87 (07/10 1929) Pulse Rate: 89 (07/10 1929)  Labs: Recent Labs    08/19/17 1217 08/19/17 1341 08/19/17 1631 08/19/17 2017  HGB 13.6  --   --   --   HCT 41.7  --   --   --   PLT 270  --   --   --   APTT  --  25  --   --   LABPROT  --  12.8  --   --   INR  --  0.97  --   --   HEPARINUNFRC  --   --   --  0.64  CREATININE 1.03  --   --   --   TROPONINI 0.11*  --  0.72*  --     Estimated Creatinine Clearance: 107.9 mL/min (by C-G formula based on SCr of 1.03 mg/dL).   Medical History: Past Medical History:  Diagnosis Date  . 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 Decreased libido 01/10/2013  . 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 Elevated prostate specific antigen (PSA) 02/21/2013  . Incomplete bladder emptying 01/10/2013  . Malignant neoplasm of prostate ([place]) 04/28/2013    62 y/o M with no h/o anticoagulant use admitted with ACS to begin heparin infusion.   Goal of Therapy:  Heparin level 0.3-0.7 units/ml Monitor platelets by anticoagulation protocol: Yes  Assessment/Plan Continue heparin infusion at current rate and recheck a HL in 6 hours.   Ulice Dash D 08/19/2017,8:51 PM

## 2017-08-19 NOTE — Progress Notes (Addendum)
CRITICAL VALUE ALERT  Critical Value: troponin 0.72  Date & Time Notied: 5331  Provider Notified: MD Marguerite Olea, MD Fath paged   Orders Received/Actions taken: no new orders

## 2017-08-20 ENCOUNTER — Encounter
Admission: EM | Disposition: A | Discharge status: Home / Self Care | Payer: Self-pay | Source: Home / Self Care | Attending: Internal Medicine

## 2017-08-20 HISTORY — PX: LEFT HEART CATH AND CORONARY ANGIOGRAPHY: CATH118249

## 2017-08-20 HISTORY — PX: CORONARY STENT INTERVENTION: CATH118234

## 2017-08-20 LAB — GLUCOSE, CAPILLARY: Glucose-Capillary: 149 mg/dL — ABNORMAL HIGH (ref 70–99)

## 2017-08-20 LAB — BASIC METABOLIC PANEL
Anion gap: 8 (ref 5–15)
BUN: 15 mg/dL (ref 8–23)
CO2: 26 mmol/L (ref 22–32)
Calcium: 9 mg/dL (ref 8.9–10.3)
Chloride: 103 mmol/L (ref 98–111)
Creatinine, Ser: 0.7 mg/dL (ref 0.61–1.24)
GFR calc Af Amer: >60 mL/min (ref >60)
GFR calc non Af Amer: >60 mL/min (ref >60)
Glucose, Bld: 158 mg/dL — ABNORMAL HIGH (ref 70–99)
Potassium: 3.6 mmol/L (ref 3.5–5.1)
Sodium: 137 mmol/L (ref 135–145)

## 2017-08-20 LAB — LIPID PANEL
Cholesterol: 152 mg/dL (ref 0–200)
HDL: 55 mg/dL (ref >40)
LDL Cholesterol: 83 mg/dL (ref 0–99)
Total CHOL/HDL Ratio: 2.8 RATIO
Triglycerides: 68 mg/dL (ref <150)
VLDL: 14 mg/dL (ref 0–40)

## 2017-08-20 LAB — HEPARIN LEVEL (UNFRACTIONATED): Heparin Unfractionated: 0.67 IU/mL (ref 0.30–0.70)

## 2017-08-20 LAB — CBC
HCT: 38.4 % — ABNORMAL LOW (ref 40.0–52.0)
Hemoglobin: 12.8 g/dL — ABNORMAL LOW (ref 13.0–18.0)
MCH: 25.8 pg — ABNORMAL LOW (ref 26.0–34.0)
MCHC: 33.2 g/dL (ref 32.0–36.0)
MCV: 77.8 fL — ABNORMAL LOW (ref 80.0–100.0)
Platelets: 260 10*3/uL (ref 150–440)
RBC: 4.94 MIL/uL (ref 4.40–5.90)
RDW: 15.5 % — ABNORMAL HIGH (ref 11.5–14.5)
WBC: 9.4 10*3/uL (ref 3.8–10.6)

## 2017-08-20 LAB — ECHOCARDIOGRAM COMPLETE
Height: 73 in
Weight: 4702.4 oz

## 2017-08-20 LAB — POCT ACTIVATED CLOTTING TIME: Activated Clotting Time: 312 seconds

## 2017-08-20 LAB — TROPONIN I
Troponin I: 25.22 ng/mL (ref <0.03)
Troponin I: 28.65 ng/mL (ref <0.03)
Troponin I: 42.31 ng/mL (ref <0.03)

## 2017-08-20 LAB — HIV ANTIBODY (ROUTINE TESTING W REFLEX): HIV Screen 4th Generation wRfx: NONREACTIVE

## 2017-08-20 SURGERY — LEFT HEART CATH AND CORONARY ANGIOGRAPHY
Anesthesia: Moderate Sedation

## 2017-08-20 MED ORDER — LABETALOL HCL 5 MG/ML IV SOLN: 10.0000 mg | INTRAVENOUS | Status: AC | PRN

## 2017-08-20 MED ORDER — FENTANYL CITRATE (PF) 100 MCG/2ML IJ SOLN
INTRAMUSCULAR | Status: AC
  Filled 2017-08-20: qty 2

## 2017-08-20 MED ORDER — IOPAMIDOL (ISOVUE-300) INJECTION 61%
INTRAVENOUS | Status: DC | PRN
  Administered 2017-08-20: 105 mL via INTRA_ARTERIAL

## 2017-08-20 MED ORDER — NITROGLYCERIN 5 MG/ML IV SOLN
INTRAVENOUS | Status: AC
  Filled 2017-08-20: qty 10

## 2017-08-20 MED ORDER — TICAGRELOR 90 MG PO TABS
90.0000 mg | ORAL_TABLET | Freq: Two times a day (BID) | ORAL | Status: DC
  Administered 2017-08-20 – 2017-08-21 (×2): 90 mg via ORAL
  Filled 2017-08-20: qty 1

## 2017-08-20 MED ORDER — SODIUM CHLORIDE 0.9% FLUSH: 3.0000 mL | Freq: Two times a day (BID) | INTRAVENOUS | Status: DC

## 2017-08-20 MED ORDER — BIVALIRUDIN TRIFLUOROACETATE 250 MG IV SOLR
INTRAVENOUS | Status: AC
  Filled 2017-08-20: qty 250

## 2017-08-20 MED ORDER — LIDOCAINE HCL (PF) 1 % IJ SOLN
INTRAMUSCULAR | Status: DC | PRN
  Administered 2017-08-20: 18 mL via INTRADERMAL

## 2017-08-20 MED ORDER — ONDANSETRON HCL 4 MG/2ML IJ SOLN: 4.0000 mg | Freq: Four times a day (QID) | INTRAMUSCULAR | Status: DC | PRN

## 2017-08-20 MED ORDER — ACETAMINOPHEN 325 MG PO TABS
650.0000 mg | ORAL_TABLET | ORAL | Status: DC | PRN
  Administered 2017-08-20 – 2017-08-21 (×3): 650 mg via ORAL

## 2017-08-20 MED ORDER — SODIUM CHLORIDE 0.9 % IV SOLN
INTRAVENOUS | Status: AC | PRN
  Administered 2017-08-20 (×2): 1.75 mg/kg/h via INTRAVENOUS

## 2017-08-20 MED ORDER — HEPARIN (PORCINE) IN NACL 1000-0.9 UT/500ML-% IV SOLN
INTRAVENOUS | Status: AC
  Filled 2017-08-20: qty 1000

## 2017-08-20 MED ORDER — MIDAZOLAM HCL 2 MG/2ML IJ SOLN
INTRAMUSCULAR | Status: DC | PRN
  Administered 2017-08-20: 1 mg via INTRAVENOUS

## 2017-08-20 MED ORDER — SODIUM CHLORIDE 0.9 % WEIGHT BASED INFUSION: 1.0000 mL/kg/h | INTRAVENOUS | Status: AC

## 2017-08-20 MED ORDER — TICAGRELOR 90 MG PO TABS
ORAL_TABLET | ORAL | Status: AC
  Filled 2017-08-20: qty 2

## 2017-08-20 MED ORDER — MORPHINE SULFATE (PF) 2 MG/ML IV SOLN
INTRAVENOUS | Status: AC
  Filled 2017-08-20: qty 1

## 2017-08-20 MED ORDER — TICAGRELOR 90 MG PO TABS
ORAL_TABLET | ORAL | Status: DC | PRN
  Administered 2017-08-20: 180 mg via ORAL

## 2017-08-20 MED ORDER — ASPIRIN 81 MG PO CHEW
CHEWABLE_TABLET | ORAL | Status: AC
  Filled 2017-08-20: qty 3

## 2017-08-20 MED ORDER — BIVALIRUDIN BOLUS VIA INFUSION - CUPID
INTRAVENOUS | Status: DC | PRN
  Administered 2017-08-20: 99.975 mg via INTRAVENOUS

## 2017-08-20 MED ORDER — ASPIRIN 81 MG PO CHEW: 81.0000 mg | CHEWABLE_TABLET | Freq: Every day | ORAL | Status: DC

## 2017-08-20 MED ORDER — SODIUM CHLORIDE 0.9 % IV SOLN: 250.0000 mL | INTRAVENOUS | Status: DC | PRN

## 2017-08-20 MED ORDER — FENTANYL CITRATE (PF) 100 MCG/2ML IJ SOLN
INTRAMUSCULAR | Status: DC | PRN
  Administered 2017-08-20: 50 ug via INTRAVENOUS

## 2017-08-20 MED ORDER — MIDAZOLAM HCL 2 MG/2ML IJ SOLN
INTRAMUSCULAR | Status: AC
  Filled 2017-08-20: qty 2

## 2017-08-20 MED ORDER — HYDRALAZINE HCL 20 MG/ML IJ SOLN: 5.0000 mg | INTRAMUSCULAR | Status: AC | PRN

## 2017-08-20 MED ORDER — SODIUM CHLORIDE 0.9 % IV SOLN
0.2500 mg/kg/h | INTRAVENOUS | Status: AC
  Filled 2017-08-20: qty 250

## 2017-08-20 MED ORDER — ATORVASTATIN CALCIUM 20 MG PO TABS
40.0000 mg | ORAL_TABLET | Freq: Every day | ORAL | Status: DC
  Administered 2017-08-20: 40 mg via ORAL
  Filled 2017-08-20: qty 2

## 2017-08-20 MED ORDER — LIDOCAINE HCL (PF) 1 % IJ SOLN
INTRAMUSCULAR | Status: AC
  Filled 2017-08-20: qty 30

## 2017-08-20 MED ORDER — IOPAMIDOL (ISOVUE-300) INJECTION 61%
INTRAVENOUS | Status: DC | PRN
  Administered 2017-08-20: 290 mL via INTRA_ARTERIAL

## 2017-08-20 MED ORDER — SODIUM CHLORIDE 0.9% FLUSH: 3.0000 mL | INTRAVENOUS | Status: DC | PRN

## 2017-08-20 MED ORDER — BIVALIRUDIN TRIFLUOROACETATE 250 MG IV SOLR
INTRAVENOUS | Status: AC
Start: 2017-08-20 — End: ?
  Filled 2017-08-20: qty 250

## 2017-08-20 SURGICAL SUPPLY — 23 items
BALLN MINITREK RX 1.5X15 (BALLOONS) ×2
BALLN TREK RX 2.5X15 (BALLOONS) ×2
BALLN ~~LOC~~ TREK RX 4.5X12 (BALLOONS) ×2
BALLOON MINITREK RX 1.5X15 (BALLOONS) ×1 IMPLANT
BALLOON TREK RX 2.5X15 (BALLOONS) ×1 IMPLANT
BALLOON ~~LOC~~ TREK RX 4.5X12 (BALLOONS) ×1 IMPLANT
CATH INFINITI 5 FR 3DRC (CATHETERS) ×2 IMPLANT
CATH INFINITI 5FR ANG PIGTAIL (CATHETERS) ×2 IMPLANT
CATH INFINITI 5FR JL4 (CATHETERS) ×2 IMPLANT
CATH INFINITI JR4 5F (CATHETERS) ×2 IMPLANT
CATH VISTA GUIDE 6FR 3DRC (CATHETERS) ×2 IMPLANT
DEVICE CLOSURE MYNXGRIP 6/7F (Vascular Products) ×2 IMPLANT
DEVICE INFLAT 30 PLUS (MISCELLANEOUS) ×2 IMPLANT
KIT MANI 3VAL PERCEP (MISCELLANEOUS) ×2 IMPLANT
NEEDLE PERC 18GX7CM (NEEDLE) ×2 IMPLANT
PACK CARDIAC CATH (CUSTOM PROCEDURE TRAY) ×2 IMPLANT
SHEATH AVANTI 5FR X 11CM (SHEATH) ×2 IMPLANT
SHEATH AVANTI 6FR X 11CM (SHEATH) ×2 IMPLANT
STENT SIERRA 2.75 X 15 MM (Permanent Stent) ×2 IMPLANT
STENT SIERRA 4.00 X 15 MM (Permanent Stent) ×2 IMPLANT
WIRE ASAHI GRAND SLAM 180CM (WIRE) ×2 IMPLANT
WIRE G HI TQ BMW 190 (WIRE) ×2 IMPLANT
WIRE GUIDERIGHT .035X150 (WIRE) ×2 IMPLANT

## 2017-08-20 NOTE — Progress Notes (Signed)
Report given to procedure RN, patient transferred to pre-procedural area on monitor. Wife went home, will return.

## 2017-08-20 NOTE — Progress Notes (Signed)
Critical troponin 4.94 reported. MD Jannifer Franklin made aware. Nitroglycerin drip ordered. Pt given morphine for 7/10 pain. When nitro drip started pt c/o 5/10 pain. After first titrate to 10 mcg, pt pain reported at 3/10. After 5 minutes, pt stated it was 1/10. Drip titrated down to 5 mcg.Pt has maintained pain of 1/10 or less on subsequent reassessments. BP stable. Pt instructed to alert this nurse if pain begins to increase or he has any signs of chest tightness or radiating pain. Pt verbaluized understanding. Will continue to monitor.

## 2017-08-20 NOTE — Progress Notes (Signed)
Patient Name: Daniel Bradley. Date of Encounter: 08/20/2017  Hospital Problem List     Active Problems:   Non-STEMI (non-ST elevated myocardial infarction) [facility])    Patient Profile     62 year old male with no prior cardiac history admitted with chest pain and ruled in for non-ST elevation myocardial infarction echo showed apical and anteroapical akinesis EF 45 to 50%.  Subjective   Still with chest pain.  Plan for cardiac cath today.  Further recommendations after completion.  Probable LAD disease versus Takotsubo's.  Inpatient Medications    . aspirin  324 mg Oral NOW   Or  . aspirin  300 mg Rectal NOW  . aspirin  81 mg Oral Pre-Cath  . aspirin EC  81 mg Oral Daily  . atorvastatin  20 mg Oral q1800  . hydrochlorothiazide  25 mg Oral Daily  . lisinopril  20 mg Oral Daily  . metoprolol tartrate  25 mg Oral BID  . sodium chloride flush  3 mL Intravenous Q12H  . sodium chloride flush  3 mL Intravenous Q12H    Vital Signs    Vitals:   08/19/17 2300 08/19/17 2330 08/20/17 0000 08/20/17 0402  BP: (!) 155/66 133/82 (!) 138/92 (!) 126/92  Pulse: 94 85 85 96  Resp:      Temp:      TempSrc:      SpO2:      Weight:      Height:        Intake/Output Summary (Last 24 hours) at 08/20/2017 0732 Last data filed at 08/19/2017 1700 Gross per 24 hour  Intake 1047.5 ml  Output -  Net 1047.5 ml   Filed Weights   08/19/17 1215 08/19/17 1603  Weight: 133.8 kg (295 lb) 133.3 kg (293 lb 14.4 oz)    Physical Exam    GEN: Well nourished, well developed, in no acute distress.  HEENT: normal.  Neck: Supple, no JVD, carotid bruits, or masses. Cardiac: RRR, no murmurs, rubs, or gallops. No clubbing, cyanosis, edema.  Radials/DP/PT 2+ and equal bilaterally.  Respiratory:  Respirations regular and unlabored, clear to auscultation bilaterally. GI: Soft, nontender, nondistended, BS + x 4. MS: no deformity or atrophy. Skin: warm and dry, no rash. Neuro:  Strength and sensation  are intact. Psych: Normal affect.  Labs    CBC Recent Labs    08/19/17 1217 08/20/17 0359  WBC 7.2 9.4  HGB 13.6 12.8*  HCT 41.7 38.4*  MCV 78.3* 77.8*  PLT 270 938   Basic Metabolic Panel Recent Labs    08/19/17 1217 08/20/17 0359  NA 143 137  K 3.6 3.6  CL 108 103  CO2 24 26  GLUCOSE 161* 158*  BUN 20 15  CREATININE 1.03 0.70  CALCIUM 9.9 9.0   Liver Function Tests No results for input(s): AST, ALT, ALKPHOS, BILITOT, PROT, ALBUMIN in the last 72 hours. No results for input(s): LIPASE, AMYLASE in the last 72 hours. Cardiac Enzymes Recent Labs    08/19/17 1631 08/19/17 2017 08/20/17 0359  TROPONINI 0.72* 4.94* 28.65*   BNP No results for input(s): BNP in the last 72 hours. D-Dimer No results for input(s): DDIMER in the last 72 hours. Hemoglobin A1C No results for input(s): HGBA1C in the last 72 hours. Fasting Lipid Panel Recent Labs    08/20/17 0359  CHOL 152  HDL 55  LDLCALC 83  TRIG 68  CHOLHDL 2.8   Thyroid Function Tests No results for input(s): TSH, T4TOTAL, T3FREE, THYROIDAB  in the last 72 hours.  Invalid input(s): FREET3  Telemetry    Normal sinus rhythm  ECG    Sinus rhythm with no ST elevation.  Radiology    Dg Chest 2 View  Result Date: 08/19/2017 CLINICAL DATA:  Chest tightness EXAM: CHEST - 2 VIEW COMPARISON:  None. FINDINGS: Lungs are clear. Heart size and pulmonary vascularity are normal. No adenopathy. No pneumothorax. There is degenerative change in the thoracic spine. IMPRESSION: No edema or consolidation. Electronically Signed   By: Lowella Grip III M.D.   On: 08/19/2017 13:13    Assessment & Plan    62 year old male with no prior cardiac history who presented with chest pain.  He is ruled in for a myocardial infarction with troponin of 28.65.  Was 0.11 on presentation.  Echo shows apical and anteroapical akinesis consistent with apical ballooning versus LAD infarction.  We will proceed with left heart cath with  further recognitions after this is complete.  Signed, Javier Docker Fath MD 08/20/2017, 7:32 AM  Pager: [phone]

## 2017-08-20 NOTE — Progress Notes (Signed)
ANTICOAGULATION CONSULT NOTE  Pharmacy Consult for Heparin Indication: chest pain/ACS  No Known Allergies  Patient Measurements: Height: 6\' 1"  (185.4 cm) Weight: 293 lb 14.4 oz (133.3 kg) IBW/kg (Calculated) : 79.9 Heparin Dosing Weight: 110 kg  Vital Signs: Temp: 98.8 F (37.1 C) (07/10 1929) Temp Source: Oral (07/10 1929) BP: 153/90 (07/10 2204) Pulse Rate: 84 (07/10 2204)  Labs: Recent Labs    08/19/17 1217 08/19/17 1341 08/19/17 1631 08/19/17 2017 08/20/17 0359  HGB 13.6  --   --   --  12.8*  HCT 41.7  --   --   --  38.4*  PLT 270  --   --   --  260  APTT  --  25  --   --   --   LABPROT  --  12.8  --   --   --   INR  --  0.97  --   --   --   HEPARINUNFRC  --   --   --  0.64 0.67  CREATININE 1.03  --   --   --   --   TROPONINI 0.11*  --  0.72* 4.94*  --     Estimated Creatinine Clearance: 107.9 mL/min (by C-G formula based on SCr of 1.03 mg/dL).   Medical History: Past Medical History:  Diagnosis Date  . 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 Decreased libido 01/10/2013  . 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 Elevated prostate specific antigen (PSA) 02/21/2013  . Incomplete bladder emptying 01/10/2013  . Malignant neoplasm of prostate ([place]) 04/28/2013    62 y/o M with no h/o anticoagulant use admitted with ACS to begin heparin infusion.   Goal of Therapy:  Heparin level 0.3-0.7 units/ml Monitor platelets by anticoagulation protocol: Yes  Assessment/Plan Continue heparin infusion at current rate and recheck a HL in 6 hours.   7/11:  HL @ 0400 = 0.67 Will continue this pt on current rate and recheck HL on 7/12 with AM labs.   Dann Galicia D 08/20/2017,4:50 AM

## 2017-08-20 NOTE — Care Management (Signed)
Admitted with nstemi. Cardiac cath and PCI.  Patient to discharge home on aspirin and plavix.  No discharge needs identified

## 2017-08-20 NOTE — Progress Notes (Signed)
[place] [facility] [place] NAME: Daniel Bradley    MR#:  [id number]  DATE OF BIRTH:  1955/09/25  SUBJECTIVE:  CHIEF COMPLAINT:   Chief Complaint  Patient presents with  . Chest Pain   - admitted for chest pain, positive troponin - for cardiac cath today  REVIEW OF SYSTEMS:  Review of Systems  Constitutional: Negative for chills, fever and malaise/fatigue.  HENT: Negative for congestion, ear discharge, hearing loss and nosebleeds.   Eyes: Negative for blurred vision and double vision.  Respiratory: Negative for cough, shortness of breath and wheezing.   Cardiovascular: Positive for chest pain. Negative for palpitations.  Gastrointestinal: Negative for abdominal pain, constipation, diarrhea, nausea and vomiting.  Genitourinary: Negative for dysuria.  Musculoskeletal: Negative for myalgias.  Neurological: Negative for dizziness, focal weakness, seizures, weakness and headaches.  Psychiatric/Behavioral: Negative for depression.    DRUG ALLERGIES:  No Known Allergies  VITALS:  Blood pressure 122/69, pulse 76, temperature 98.5 F (36.9 C), temperature source Oral, resp. rate 18, height 6\' 1"  (1.854 m), weight 133.3 kg (293 lb 14.4 oz), SpO2 96 %.  PHYSICAL EXAMINATION:  Physical Exam  GENERAL:  62 y.o.-year-old patient lying in the bed with no acute distress.  EYES: Pupils equal, round, reactive to light and accommodation. No scleral icterus. Extraocular muscles intact.  HEENT: Head atraumatic, normocephalic. Oropharynx and nasopharynx clear.  NECK:  Supple, no jugular venous distention. No thyroid enlargement, no tenderness.  LUNGS: Normal breath sounds bilaterally, no wheezing, rales,rhonchi or crepitation. No use of accessory muscles of respiration.  CARDIOVASCULAR: S1, S2 normal. No murmurs, rubs, or gallops.  ABDOMEN: Soft, nontender, nondistended. Bowel sounds present. No organomegaly or mass.  EXTREMITIES: No pedal edema,  cyanosis, or clubbing.  NEUROLOGIC: Cranial nerves II through XII are intact. Muscle strength 5/5 in all extremities. Sensation intact. Gait not checked.  PSYCHIATRIC: The patient is alert and oriented x 3.  SKIN: No obvious rash, lesion, or ulcer.    LABORATORY PANEL:   CBC Recent Labs  Lab 08/20/17 0359  WBC 9.4  HGB 12.8*  HCT 38.4*  PLT 260   ------------------------------------------------------------------------------------------------------------------  Chemistries  Recent Labs  Lab 08/20/17 0359  NA 137  K 3.6  CL 103  CO2 26  GLUCOSE 158*  BUN 15  CREATININE 0.70  CALCIUM 9.0   ------------------------------------------------------------------------------------------------------------------  Cardiac Enzymes Recent Labs  Lab 08/20/17 0359  TROPONINI 25.22*  28.65*   ------------------------------------------------------------------------------------------------------------------  RADIOLOGY:  Dg Chest 2 View  Result Date: 08/19/2017 CLINICAL DATA:  Chest tightness EXAM: CHEST - 2 VIEW COMPARISON:  None. FINDINGS: Lungs are clear. Heart size and pulmonary vascularity are normal. No adenopathy. No pneumothorax. There is degenerative change in the thoracic spine. IMPRESSION: No edema or consolidation. Electronically Signed   By: Lowella Grip III M.D.   On: 08/19/2017 13:13    EKG:   Orders placed or performed during the hospital encounter of 08/19/17  . EKG 12-Lead  . EKG 12-Lead  . ED EKG within 10 minutes  . ED EKG within 10 minutes  . EKG 12-Lead  . EKG 12-Lead  . EKG 12-Lead  . EKG 12-Lead  . EKG 12-Lead  . EKG 12-Lead    ASSESSMENT AND PLAN:   62 year old male patient with history of malignant neoplasm of the prostate, erectile dysfunction presented to emergency room with chest pain   1. NSTEMI- positive troponins and EKG changes - no prior cardiac history - appreciate cardiology consult - ECHO with  apical and ant hypokinesis, EF  45% - for cardiac cath today - continue asa, metoprolol. Lisinopril and statin - on heparin drip  2. HTN- metoprolol and lisinoril/HCTZ  3. DM- hold metformin due to cardiac cath  4. Prostate cancer- continue outpt follow up as prior  5. DVT Prophylaxis- heparin drip for now    All the records are reviewed and case discussed with Care Management/Social Workerr. Management plans discussed with the patient, family and they are in agreement.  CODE STATUS: Full Code  TOTAL TIME TAKING CARE OF THIS PATIENT: 36 minutes.   POSSIBLE D/C IN 1-2 DAYS, DEPENDING ON CLINICAL CONDITION.   Gladstone Lighter M.D on 08/20/2017 at 2:47 PM  Between 7am to 6pm - Pager - [phone]  After 6pm go to [email or web address] - password [email or web address] [facility]  Office  [phone]  CC: Primary care physician; Karen Kitchens, MD

## 2017-08-20 NOTE — Progress Notes (Signed)
Pt complaining of chronic back pain in lower back 10/10 ache. Dr. Ubaldo Glassing notified, Per MD ok to give PRN morphine on MAR. Also heparin gtt on MAR D/C per MD.

## 2017-08-21 ENCOUNTER — Encounter: Payer: Self-pay | Admitting: Cardiology

## 2017-08-21 LAB — BASIC METABOLIC PANEL
Anion gap: 10 (ref 5–15)
BUN: 11 mg/dL (ref 8–23)
CO2: 27 mmol/L (ref 22–32)
Calcium: 9.4 mg/dL (ref 8.9–10.3)
Chloride: 101 mmol/L (ref 98–111)
Creatinine, Ser: 0.82 mg/dL (ref 0.61–1.24)
GFR calc Af Amer: >60 mL/min (ref >60)
GFR calc non Af Amer: >60 mL/min (ref >60)
Glucose, Bld: 159 mg/dL — ABNORMAL HIGH (ref 70–99)
Potassium: 3.5 mmol/L (ref 3.5–5.1)
Sodium: 138 mmol/L (ref 135–145)

## 2017-08-21 LAB — CBC
HCT: 42.6 % (ref 40.0–52.0)
Hemoglobin: 14.1 g/dL (ref 13.0–18.0)
MCH: 25.8 pg — ABNORMAL LOW (ref 26.0–34.0)
MCHC: 33.1 g/dL (ref 32.0–36.0)
MCV: 78 fL — ABNORMAL LOW (ref 80.0–100.0)
Platelets: 270 10*3/uL (ref 150–440)
RBC: 5.47 MIL/uL (ref 4.40–5.90)
RDW: 14.9 % — ABNORMAL HIGH (ref 11.5–14.5)
WBC: 12.4 10*3/uL — ABNORMAL HIGH (ref 3.8–10.6)

## 2017-08-21 LAB — HEPARIN LEVEL (UNFRACTIONATED): Heparin Unfractionated: <0.1 IU/mL — ABNORMAL LOW (ref 0.30–0.70)

## 2017-08-21 MED ORDER — NITROGLYCERIN 0.4 MG SL SUBL: 0.4000 mg | SUBLINGUAL_TABLET | SUBLINGUAL | 1 refills | Status: DC | PRN

## 2017-08-21 MED ORDER — METOPROLOL TARTRATE 25 MG PO TABS: 25.0000 mg | ORAL_TABLET | Freq: Two times a day (BID) | ORAL | 2 refills | Status: DC

## 2017-08-21 MED ORDER — TICAGRELOR 90 MG PO TABS: 90.0000 mg | ORAL_TABLET | Freq: Two times a day (BID) | ORAL | 3 refills | Status: DC

## 2017-08-21 MED ORDER — ATORVASTATIN CALCIUM 40 MG PO TABS: 40.0000 mg | ORAL_TABLET | Freq: Every day | ORAL | 3 refills | Status: DC

## 2017-08-21 MED ORDER — CLOPIDOGREL BISULFATE 75 MG PO TABS: 75.0000 mg | ORAL_TABLET | Freq: Every day | ORAL | 3 refills | Status: DC

## 2017-08-21 MED ORDER — LISINOPRIL-HYDROCHLOROTHIAZIDE 20-25 MG PO TABS: 1.0000 | ORAL_TABLET | Freq: Every day | ORAL | 2 refills | Status: DC

## 2017-08-21 NOTE — Progress Notes (Signed)
Cardiovascular and Pulmonary Nurse Navigator Note:    62 year old male patient with history of malignant neoplasm of the prostate, erectile dysfunction, HTN, DM (on metformin), HLD presented to emergency room with chest pain.  Patient ruled in for NSTEMI with positive troponins and EKG changes.  No prior cardiac history.  Echo revealed an EF of 45%.  Patient s/p cardiac cath which revealed ramus intermedium blockage and RCA blockages.  Patient s/p PCI with DES x 2 to RCA. Patient being discharged today on the following cardiac medications:  ASA, Brilinta, Metoprolol, Lisinopril/HCTZ, and Lipitor.    Rounded on patient.  Patient sitting up in the recliner and patient's girlfriend at bedside.  Patient gave this RN permission to speak in front of his girlfriend.    "Heart Attack Bouncing Back" booklet given and reviewed with patient. Discussed the definition of CAD. Reviewed the location of CAD and where his stents were placed. Informed patient he will be given a stent card. Explained the purpose of the stent card. Instructed patient to keep stent card in his wallet.  ? Discussed modifiable risk factors including controlling blood pressure, cholesterol, and blood sugar; following heart healthy diet; maintaining healthy weight; exercise; and smoking cessation, if applicable.   ? Discussed cardiac medications including rationale for taking, mechanisms of action, and side effects. Stressed the importance of taking medications as prescribed.  ? Discussed emergency plan for heart attack symptoms. Patient verbalized understanding of need to call 911 and not to drive himself to ER if having cardiac symptoms / chest pain.  ? Heart healthy diet of low sodium, low fat, low cholesterol heart healthy carb modified diet discussed. Information on diet provided. ? Smoking Cessation - Patient is a NEVER smoker.   ? Exercise - Benefits of exercised discussed.  Patient retired about a month ago and started working  part-time.  Patient is a member of [employer], but has not been going on a regular basis.  Informed patient that his cardiologist has referred him to outpatient Cardiac Rehab. An overview of the program was provided. Informational letter, class and orientation times, and CPT billing codes given to patient. Patient is interested in participating. Potential barrier to patient participating in Cardiac Rehab:  Given patient just went part-time about a month ago, he is uncertain what his insurance coverage is and if it is still current.  Patient plans to check with his insurance company to see what his out-of-pocket expenses will be.  ? Patient appreciative of the information.  ? Roanna Epley, RN, BSN, [place]  [facility] Cardiac & Pulmonary Rehab  Cardiovascular & Pulmonary Nurse Navigator  Direct Line: [phone]  Department Phone #: [phone] Fax: [phone]  Email Address: [email or web address]

## 2017-08-21 NOTE — Progress Notes (Signed)
Patient Name: Daniel Bradley. Date of Encounter: 08/21/2017  Hospital Problem List     Active Problems:   Non-STEMI (non-ST elevated myocardial infarction) [facility])    Patient Profile     62 year old with non-ST elevation myocardial infarction.  Cardiac cath revealed occluded small ramus intermedius as well as an 80% proximal 99% mid RCA.  Underwent PCI x2 with drug-eluting stents.  Doing well post cath  Subjective   No chest pain.  Does complain of sleep apnea symptoms.  Had CPAP several years ago but has not been compliant.  Will need reevaluation as an outpatient.  Inpatient Medications    . aspirin EC  81 mg Oral Daily  . atorvastatin  40 mg Oral q1800  . hydrochlorothiazide  25 mg Oral Daily  . lisinopril  20 mg Oral Daily  . metoprolol tartrate  25 mg Oral BID  . sodium chloride flush  3 mL Intravenous Q12H  . sodium chloride flush  3 mL Intravenous Q12H  . ticagrelor  90 mg Oral BID    Vital Signs    Vitals:   08/20/17 1715 08/20/17 1719 08/20/17 1738 08/20/17 1925  BP: (!) 142/90  133/81 133/71  Pulse: 75 76 76 82  Resp: 17 20 18 18   Temp:   99.3 F (37.4 C) 99.2 F (37.3 C)  TempSrc:   Oral Oral  SpO2: 99% 99% 99% 97%  Weight:      Height:        Intake/Output Summary (Last 24 hours) at 08/21/2017 0801 Last data filed at 08/21/2017 0559 Gross per 24 hour  Intake 1283.76 ml  Output 2525 ml  Net -1241.24 ml   Filed Weights   08/19/17 1215 08/19/17 1603  Weight: 133.8 kg (295 lb) 133.3 kg (293 lb 14.4 oz)    Physical Exam    GEN: Well nourished, well developed, in no acute distress.  HEENT: normal.  Neck: Supple, no JVD, carotid bruits, or masses. Cardiac: RRR, no murmurs, rubs, or gallops. No clubbing, cyanosis, edema.  Radials/DP/PT 2+ and equal bilaterally.  Cath site clean and dry without hematoma or bruit. Respiratory:  Respirations regular and unlabored, clear to auscultation bilaterally. GI: Soft, nontender, nondistended, BS + x 4. MS: no  deformity or atrophy. Skin: warm and dry, no rash. Neuro:  Strength and sensation are intact. Psych: Normal affect.  Labs    CBC Recent Labs    08/20/17 0359 08/21/17 0525  WBC 9.4 12.4*  HGB 12.8* 14.1  HCT 38.4* 42.6  MCV 77.8* 78.0*  PLT 260 174   Basic Metabolic Panel Recent Labs    08/20/17 0359 08/21/17 0525  NA 137 138  K 3.6 3.5  CL 103 101  CO2 26 27  GLUCOSE 158* 159*  BUN 15 11  CREATININE 0.70 0.82  CALCIUM 9.0 9.4   Liver Function Tests No results for input(s): AST, ALT, ALKPHOS, BILITOT, PROT, ALBUMIN in the last 72 hours. No results for input(s): LIPASE, AMYLASE in the last 72 hours. Cardiac Enzymes Recent Labs    08/19/17 2017 08/20/17 0359 08/20/17 1806  TROPONINI 4.94* 25.22*  28.65* 42.31*   BNP No results for input(s): BNP in the last 72 hours. D-Dimer No results for input(s): DDIMER in the last 72 hours. Hemoglobin A1C No results for input(s): HGBA1C in the last 72 hours. Fasting Lipid Panel Recent Labs    08/20/17 0359  CHOL 152  HDL 55  LDLCALC 83  TRIG 68  CHOLHDL 2.8   Thyroid  Function Tests No results for input(s): TSH, T4TOTAL, T3FREE, THYROIDAB in the last 72 hours.  Invalid input(s): FREET3  Telemetry    Normal sinus rhythm  ECG    Normal sinus rhythm with no ischemia  Radiology    Dg Chest 2 View  Result Date: 08/19/2017 CLINICAL DATA:  Chest tightness EXAM: CHEST - 2 VIEW COMPARISON:  None. FINDINGS: Lungs are clear. Heart size and pulmonary vascularity are normal. No adenopathy. No pneumothorax. There is degenerative change in the thoracic spine. IMPRESSION: No edema or consolidation. Electronically Signed   By: Lowella Grip III M.D.   On: 08/19/2017 13:13    Assessment & Plan    61 year old male with coronary disease status post non-ST elevation myocardial infarction  Cardiac cath revealed occluded ramus intermedius likely infarct-related vessel.  Had a 99% mid 80% proximal RCA underwent PCI with  drug-eluting stents x2 in this distribution.  Did well.  Currently on aspirin and Plavix.  We will continue with aspirin and Plavix for her.  We will continue with high intensity statin with atorvastatin at 40 mg daily.  We will continue with metoprolol tartrate at 25 mg twice daily lisinopril 20 mg daily hydrochlorothiazide 25 mg daily.  Ambulating okay for discharge today with outpatient follow-up in our office in 1 week.  No work until seen in our office.  No heavy lifting for 2 days.  No driving today or tomorrow.  Signed, Javier Docker Rasheena Talmadge MD 08/21/2017, 8:01 AM  Pager: [phone]

## 2017-08-21 NOTE — Progress Notes (Signed)
Patient currently resting with wife at bedside. Previous RN gave benadryl per PRN order for sleep and is effective. Right femoral site +0, intact, no hematoma or discomfort noted at this time. Will continue to monitor to end of shift.

## 2017-08-21 NOTE — Discharge Summary (Signed)
[facility] NAME: Daniel Bradley    MR#:  [id number]  DATE OF BIRTH:  1955-11-08  DATE OF ADMISSION:  08/19/2017   ADMITTING PHYSICIAN: Saundra Shelling, MD  DATE OF DISCHARGE:  08/21/17  PRIMARY CARE PHYSICIAN: Karen Kitchens, MD   ADMISSION DIAGNOSIS:   Precordial pain [R07.2] NSTEMI (non-ST elevated myocardial infarction) ([place]) [I21.4]  DISCHARGE DIAGNOSIS:   Active Problems:   Non-STEMI (non-ST elevated myocardial infarction) ([place])   SECONDARY DIAGNOSIS:   Past Medical History:  Diagnosis Date  . 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 Decreased libido 01/10/2013  . 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 Elevated prostate specific antigen (PSA) 02/21/2013  . Incomplete bladder emptying 01/10/2013  . Malignant neoplasm of prostate ([place]) 04/28/2013    HOSPITAL COURSE:   62 year old male patient with history of malignant neoplasm of the prostate, erectile dysfunction presented to emergency room with chest pain   1. NSTEMI- ruled in with positive troponins and EKG changes - no prior cardiac history - appreciate cardiology consult - ECHO with apical and ant hypokinesis, EF 45% - s/p cardiac cath - has ramus intermedius blockage and RCA blockages-received 2 PCI to RCA - continue asa, brillinta, metoprolol. Lisinopril and high intensity statin -cardiac rehab at discharge -follow-up with cardiology in one week  2. HTN- metoprolol and lisinoril/HCTZ  3. DM- restart metformin tomorrow. Held here due to contrast fo cardiac cath  4. Prostate cancer- continue outpt follow up as prior  5. Hyperlipidemia-high intensity statin   Stable and ambulating. Discharged home today   DISCHARGE CONDITIONS:   Guarded  CONSULTS OBTAINED:   Treatment Team:  Teodoro Spray, MD  DRUG ALLERGIES:   No Known Allergies DISCHARGE MEDICATIONS:   Allergies as of 08/21/2017   No Known Allergies       Medication List    STOP taking these medications   Alprostadil (Vasodilator) 500 MCG Pllt Commonly known as:  MUSE   BC HEADACHE POWDER PO   meloxicam 15 MG tablet Commonly known as:  MOBIC     TAKE these medications   aspirin EC 81 MG tablet Take 81 mg by mouth daily.   atorvastatin 40 MG tablet Commonly known as:  LIPITOR Take 1 tablet (40 mg total) by mouth daily at 6 PM. What changed:    medication strength  See the new instructions.   lisinopril-hydrochlorothiazide 20-25 MG tablet Commonly known as:  PRINZIDE,ZESTORETIC Take 1 tablet by mouth daily. What changed:  how much to take   metFORMIN 500 MG tablet Commonly known as:  GLUCOPHAGE Take 500 mg by mouth 2 (two) times daily with a meal.   metoprolol tartrate 25 MG tablet Commonly known as:  LOPRESSOR Take 1 tablet (25 mg total) by mouth 2 (two) times daily.   nitroGLYCERIN 0.4 MG SL tablet Commonly known as:  NITROSTAT Place 1 tablet (0.4 mg total) under the tongue every 5 (five) minutes as needed for chest pain.   ticagrelor 90 MG Tabs tablet Commonly known as:  BRILINTA Take 1 tablet (90 mg total) by mouth 2 (two) times daily.        DISCHARGE INSTRUCTIONS:   1. PCP file (two weeks 2. Cardiology follow-up in one week  DIET:   Cardiac diet  ACTIVITY:   Activity as tolerated  OXYGEN:   Home Oxygen: No.  Oxygen Delivery: room air  DISCHARGE LOCATION:   home   If you experience worsening of your  admission symptoms, develop shortness of breath, life threatening emergency, suicidal or homicidal thoughts you must seek medical attention immediately by calling 911 or calling your MD immediately  if symptoms less severe.  You Must read complete instructions/literature along with all the possible adverse reactions/side effects for all the Medicines you take and that have been prescribed to you. Take any new Medicines after you have completely understood and accpet all the possible adverse  reactions/side effects.   Please note  You were cared for by a hospitalist during your hospital stay. If you have any questions about your discharge medications or the care you received while you were in the hospital after you are discharged, you can call the unit and asked to speak with the hospitalist on call if the hospitalist that took care of you is not available. Once you are discharged, your primary care physician will handle any further medical issues. Please note that NO REFILLS for any discharge medications will be authorized once you are discharged, as it is imperative that you return to your primary care physician (or establish a relationship with a primary care physician if you do not have one) for your aftercare needs so that they can reassess your need for medications and monitor your lab values.    On the day of Discharge:  VITAL SIGNS:   Blood pressure 116/69, pulse 99, temperature 97.9 F (36.6 C), temperature source Oral, resp. rate 18, height 6\' 1"  (1.854 m), weight 133.3 kg (293 lb 14.4 oz), SpO2 96 %.  PHYSICAL EXAMINATION:    GENERAL:  62 y.o.-year-old obese patient lying in the bed with no acute distress.  EYES: Pupils equal, round, reactive to light and accommodation. No scleral icterus. Extraocular muscles intact.  HEENT: Head atraumatic, normocephalic. Oropharynx and nasopharynx clear.  NECK:  Supple, no jugular venous distention. No thyroid enlargement, no tenderness.  LUNGS: Normal breath sounds bilaterally, no wheezing, rales,rhonchi or crepitation. No use of accessory muscles of respiration.  CARDIOVASCULAR: S1, S2 normal. No murmurs, rubs, or gallops.  ABDOMEN: Soft, non-tender, non-distended. Bowel sounds present. No organomegaly or mass.  EXTREMITIES: No pedal edema, cyanosis, or clubbing. Right groin cath site looks clean. NEUROLOGIC: Cranial nerves II through XII are intact. Muscle strength 5/5 in all extremities. Sensation intact. Gait not checked.    PSYCHIATRIC: The patient is alert and oriented x 3.  SKIN: No obvious rash, lesion, or ulcer.   DATA REVIEW:   CBC Recent Labs  Lab 08/21/17 0525  WBC 12.4*  HGB 14.1  HCT 42.6  PLT 270    Chemistries  Recent Labs  Lab 08/21/17 0525  NA 138  K 3.5  CL 101  CO2 27  GLUCOSE 159*  BUN 11  CREATININE 0.82  CALCIUM 9.4     Microbiology Results  No results found for this or any previous visit.  RADIOLOGY:  No results found.   Management plans discussed with the patient, family and they are in agreement.  CODE STATUS:     Code Status Orders  (From admission, onward)        Start     Ordered   08/19/17 1552  Full code  Continuous     08/19/17 1551    Code Status History    This patient has a current code status but no historical code status.      TOTAL TIME TAKING CARE OF THIS PATIENT: 38 minutes.    Gladstone Lighter M.D on 08/21/2017 at 11:34 AM  Between 7am to 6pm -  Pager - [phone]  After 6pm go to [email or web address] - Proofreader  [facility]  Office  [phone]  CC: Primary care physician; Karen Kitchens, MD   Note: This dictation was prepared with Dragon dictation along with smaller phrase technology. Any transcriptional errors that result from this process are unintentional.

## 2017-08-21 NOTE — Progress Notes (Signed)
ANTICOAGULATION CONSULT NOTE  Pharmacy Consult for Heparin Indication: chest pain/ACS  No Known Allergies  Patient Measurements: Height: 6\' 1"  (185.4 cm) Weight: 293 lb 14.4 oz (133.3 kg) IBW/kg (Calculated) : 79.9 Heparin Dosing Weight: 110 kg  Vital Signs: Temp: 99.2 F (37.3 C) (07/11 1925) Temp Source: Oral (07/11 1925) BP: 133/71 (07/11 1925) Pulse Rate: 82 (07/11 1925)  Labs: Recent Labs    08/19/17 1217 08/19/17 1341  08/19/17 2017 08/20/17 0359 08/20/17 1806 08/21/17 0525  HGB 13.6  --   --   --  12.8*  --  14.1  HCT 41.7  --   --   --  38.4*  --  42.6  PLT 270  --   --   --  260  --  270  APTT  --  25  --   --   --   --   --   LABPROT  --  12.8  --   --   --   --   --   INR  --  0.97  --   --   --   --   --   HEPARINUNFRC  --   --   --  0.64 0.67  --  <0.10*  CREATININE 1.03  --   --   --  0.70  --  0.82  TROPONINI 0.11*  --    < > 4.94* 25.22*  28.65* 42.31*  --    < > = values in this interval not displayed.    Estimated Creatinine Clearance: 135.5 mL/min (by C-G formula based on SCr of 0.82 mg/dL).   Medical History: Past Medical History:  Diagnosis Date  . 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 Decreased libido 01/10/2013  . 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 Elevated prostate specific antigen (PSA) 02/21/2013  . Incomplete bladder emptying 01/10/2013  . Malignant neoplasm of prostate () 04/28/2013    62 y/o M with no h/o anticoagulant use admitted with ACS to begin heparin infusion.   Goal of Therapy:  Heparin level 0.3-0.7 units/ml Monitor platelets by anticoagulation protocol: Yes  Assessment/Plan Continue heparin infusion at current rate and recheck a HL in 6 hours.   7/11:  HL @ 0400 = 0.67 Will continue this pt on current rate and recheck HL on 7/12 with AM labs.   7/12: HL @ 0500 = < 0.1  Heparin gtt was d/c'd on 7/11 @ ~ 16:00 prior to cardiac cath.    Heparin has not been restarted by MD.    Violeta Gelinas D 08/21/2017,6:34 AM

## 2017-08-21 NOTE — Plan of Care (Signed)
  Problem: Education: Goal: Knowledge of General Education information will improve Outcome: Progressing   Problem: Health Behavior/Discharge Planning: Goal: Ability to manage health-related needs will improve Outcome: Progressing   Problem: Clinical Measurements: Goal: Ability to maintain clinical measurements within normal limits will improve Outcome: Progressing Goal: Will remain free from infection Outcome: Progressing Goal: Diagnostic test results will improve Outcome: Progressing Goal: Respiratory complications will improve Outcome: Progressing Goal: Cardiovascular complication will be avoided Outcome: Progressing   Problem: Activity: Goal: Risk for activity intolerance will decrease Outcome: Progressing   Problem: Nutrition: Goal: Adequate nutrition will be maintained Outcome: Progressing   Problem: Coping: Goal: Level of anxiety will decrease Outcome: Progressing   Problem: Elimination: Goal: Will not experience complications related to bowel motility Outcome: Progressing Goal: Will not experience complications related to urinary retention Outcome: Progressing   Problem: Pain Managment: Goal: General experience of comfort will improve Outcome: Progressing   Problem: Safety: Goal: Ability to remain free from injury will improve Outcome: Progressing   Problem: Skin Integrity: Goal: Risk for impaired skin integrity will decrease Outcome: Progressing   Problem: Education: Goal: Understanding of CV disease, CV risk reduction, and recovery process will improve Outcome: Progressing   Problem: Activity: Goal: Ability to return to baseline activity level will improve Outcome: Progressing   Problem: Cardiovascular: Goal: Ability to achieve and maintain adequate cardiovascular perfusion will improve Outcome: Progressing Goal: Vascular access site(s) Level 0-1 will be maintained Outcome: Progressing   Problem: Health Behavior/Discharge Planning: Goal:  Ability to safely manage health-related needs after discharge will improve Outcome: Progressing   

## 2017-08-21 NOTE — Care Management (Signed)
Patient to discharge on Brilinta.  Patient verbally confirmed pharmacy coverage with insurance.  Provided with coupon for copay assist.

## 2017-08-24 ENCOUNTER — Telehealth: Payer: Self-pay

## 2017-08-24 NOTE — Telephone Encounter (Signed)
Flagged on EMMI report for not reading discharge papers and having other questions.  First attempt to reach patient made 08/24/17 at 4:19pm, however unable to reach.  Left message encouraging callback.  Will attempt at a later time.

## 2017-08-25 NOTE — Telephone Encounter (Signed)
Second attempt to reach patient made 08/25/17 at 2pm, however unable to reach.  Left another voicemail encouraging callback should he have any questions or concerns.

## 2017-09-03 ENCOUNTER — Encounter: Payer: Self-pay | Admitting: *Deleted

## 2017-09-03 ENCOUNTER — Encounter: Payer: BLUE CROSS/BLUE SHIELD | Attending: Internal Medicine | Admitting: *Deleted

## 2017-09-03 VITALS — Ht 71.5 in | Wt 280.8 lb

## 2017-09-03 DIAGNOSIS — Z8546 Personal history of malignant neoplasm of prostate: Secondary | ICD-10-CM | POA: Insufficient documentation

## 2017-09-03 DIAGNOSIS — Z7984 Long term (current) use of oral hypoglycemic drugs: Secondary | ICD-10-CM | POA: Insufficient documentation

## 2017-09-03 DIAGNOSIS — Z79899 Other long term (current) drug therapy: Secondary | ICD-10-CM | POA: Diagnosis not present

## 2017-09-03 DIAGNOSIS — Z7902 Long term (current) use of antithrombotics/antiplatelets: Secondary | ICD-10-CM | POA: Diagnosis not present

## 2017-09-03 DIAGNOSIS — Z955 Presence of coronary angioplasty implant and graft: Secondary | ICD-10-CM

## 2017-09-03 DIAGNOSIS — Z7982 Long term (current) use of aspirin: Secondary | ICD-10-CM | POA: Diagnosis not present

## 2017-09-03 NOTE — Patient Instructions (Addendum)
Patient Instructions  Patient Details  Name: Daniel Bradley. MRN: [id number] Date of Birth: 1955/11/25 Referring Provider:  Yolonda Kida, MD  Below are your personal goals for exercise, nutrition, and risk factors. Our goal is to help you stay on track towards obtaining and maintaining these goals. We will be discussing your progress on these goals with you throughout the program.  Initial Exercise Prescription:   Exercise Goals: Frequency: Be able to perform aerobic exercise two to three times per week in program working toward 2-5 days per week of home exercise.  Intensity: Work with a perceived exertion of 11 (fairly light) - 15 (hard) while following your exercise prescription.  We will make changes to your prescription with you as you progress through the program.   Duration: Be able to do 30 to 45 minutes of continuous aerobic exercise in addition to a 5 minute warm-up and a 5 minute cool-down routine.   Nutrition Goals: Your personal nutrition goals will be established when you do your nutrition analysis with the dietician.  The following are general nutrition guidelines to follow: Cholesterol < 200mg /day Sodium < 1500mg /day Fiber: Men over 50 yrs - 30 grams per day  Personal Goals: Personal Goals and Risk Factors at Admission - 09/03/17 1329      Core Components/Risk Factors/Patient Goals on Admission    Weight Management  Yes;Obesity;Weight Loss    Intervention  Weight Management: Develop a combined nutrition and exercise program designed to reach desired caloric intake, while maintaining appropriate intake of nutrient and fiber, sodium and fats, and appropriate energy expenditure required for the weight goal.    Admit Weight  280 lb 12.8 oz (127.4 kg)    Goal Weight: Short Term  278 lb (126.1 kg)    Goal Weight: Long Term  190 lb (86.2 kg)    Expected Outcomes  Short Term: Continue to assess and modify interventions until short term weight is achieved;Long Term:  Adherence to nutrition and physical activity/exercise program aimed toward attainment of established weight goal;Weight Loss: Understanding of general recommendations for a balanced deficit meal plan, which promotes 1-2 lb weight loss per week and includes a negative energy balance of [phone] kcal/d    Diabetes  Yes    Intervention  Provide education about signs/symptoms and action to take for hypo/hyperglycemia.;Provide education about proper nutrition, including hydration, and aerobic/resistive exercise prescription along with prescribed medications to achieve blood glucose in normal ranges: Fasting glucose 65-99 mg/dL    Expected Outcomes  Short Term: Participant verbalizes understanding of the signs/symptoms and immediate care of hyper/hypoglycemia, proper foot care and importance of medication, aerobic/resistive exercise and nutrition plan for blood glucose control.;Long Term: Attainment of HbA1C < 7%.    Hypertension  Yes    Intervention  Provide education on lifestyle modifcations including regular physical activity/exercise, weight management, moderate sodium restriction and increased consumption of fresh fruit, vegetables, and low fat dairy, alcohol moderation, and smoking cessation.;Monitor prescription use compliance.    Expected Outcomes  Short Term: Continued assessment and intervention until BP is < 140/39mm HG in hypertensive participants. < 130/5mm HG in hypertensive participants with diabetes, heart failure or chronic kidney disease.;Long Term: Maintenance of blood pressure at goal levels.    Lipids  Yes    Intervention  Provide education and support for participant on nutrition & aerobic/resistive exercise along with prescribed medications to achieve LDL 70mg , HDL >40mg .    Expected Outcomes  Short Term: Participant states understanding of desired cholesterol values and is compliant  with medications prescribed. Participant is following exercise prescription and nutrition  guidelines.;Long Term: Cholesterol controlled with medications as prescribed, with individualized exercise RX and with personalized nutrition plan. Value goals: LDL < 70mg , HDL > 40 mg.    Stress  Yes Unable to relax- thoughts always going    Intervention  Offer individual and/or small group education and counseling on adjustment to heart disease, stress management and health-related lifestyle change. Teach and support self-help strategies.;Refer participants experiencing significant psychosocial distress to appropriate mental health specialists for further evaluation and treatment. When possible, include family members and significant others in education/counseling sessions.    Expected Outcomes  Short Term: Participant demonstrates changes in health-related behavior, relaxation and other stress management skills, ability to obtain effective social support, and compliance with psychotropic medications if prescribed.;Long Term: Emotional wellbeing is indicated by absence of clinically significant psychosocial distress or social isolation.       Tobacco Use Initial Evaluation: Social History   Tobacco Use  Smoking Status Never Smoker  Smokeless Tobacco Never Used    Exercise Goals and Review:   Copy of goals given to participant. Patient Instructions  Patient Details  Name: Daniel Bradley. MRN: [id number] Date of Birth: [birth year] Referring Provider:  Yolonda Kida, MD  Below are your personal goals for exercise, nutrition, and risk factors. Our goal is to help you stay on track towards obtaining and maintaining these goals. We will be discussing your progress on these goals with you throughout the program.  Initial Exercise Prescription: Initial Exercise Prescription - 09/03/17 1500      Date of Initial Exercise RX and Referring Provider   Date  09/03/17    Referring Provider  [facility]      Treadmill   MPH  2.4    Grade  0    Minutes  15    METs  2.84      Recumbant  Bike   Level  3    RPM  60    Watts  25    Minutes  15    METs  2.8      T5 Nustep   Level  2    SPM  80    Minutes  15    METs  2.8      Prescription Details   Frequency (times per week)  3    Duration  Progress to 45 minutes of aerobic exercise without signs/symptoms of physical distress      Intensity   THRR 40-80% of Max Heartrate  114-144    Ratings of Perceived Exertion  11-15    Perceived Dyspnea  0-4      Resistance Training   Training Prescription  Yes    Weight  3 lb    Reps  10-15       Exercise Goals: Frequency: Be able to perform aerobic exercise two to three times per week in program working toward 2-5 days per week of home exercise.  Intensity: Work with a perceived exertion of 11 (fairly light) - 15 (hard) while following your exercise prescription.  We will make changes to your prescription with you as you progress through the program.   Duration: Be able to do 30 to 45 minutes of continuous aerobic exercise in addition to a 5 minute warm-up and a 5 minute cool-down routine.   Nutrition Goals: Your personal nutrition goals will be established when you do your nutrition analysis with the dietician.  The following are general nutrition guidelines to  follow: Cholesterol < 200mg /day Sodium < 1500mg /day Fiber: Men over 50 yrs - 30 grams per day  Personal Goals: Personal Goals and Risk Factors at Admission - 09/03/17 1329      Core Components/Risk Factors/Patient Goals on Admission    Weight Management  Yes;Obesity;Weight Loss    Intervention  Weight Management: Develop a combined nutrition and exercise program designed to reach desired caloric intake, while maintaining appropriate intake of nutrient and fiber, sodium and fats, and appropriate energy expenditure required for the weight goal.    Admit Weight  280 lb 12.8 oz (127.4 kg)    Goal Weight: Short Term  278 lb (126.1 kg)    Goal Weight: Long Term  190 lb (86.2 kg)    Expected Outcomes  Short  Term: Continue to assess and modify interventions until short term weight is achieved;Long Term: Adherence to nutrition and physical activity/exercise program aimed toward attainment of established weight goal;Weight Loss: Understanding of general recommendations for a balanced deficit meal plan, which promotes 1-2 lb weight loss per week and includes a negative energy balance of [phone] kcal/d    Diabetes  Yes    Intervention  Provide education about signs/symptoms and action to take for hypo/hyperglycemia.;Provide education about proper nutrition, including hydration, and aerobic/resistive exercise prescription along with prescribed medications to achieve blood glucose in normal ranges: Fasting glucose 65-99 mg/dL    Expected Outcomes  Short Term: Participant verbalizes understanding of the signs/symptoms and immediate care of hyper/hypoglycemia, proper foot care and importance of medication, aerobic/resistive exercise and nutrition plan for blood glucose control.;Long Term: Attainment of HbA1C < 7%.    Hypertension  Yes    Intervention  Provide education on lifestyle modifcations including regular physical activity/exercise, weight management, moderate sodium restriction and increased consumption of fresh fruit, vegetables, and low fat dairy, alcohol moderation, and smoking cessation.;Monitor prescription use compliance.    Expected Outcomes  Short Term: Continued assessment and intervention until BP is < 140/76mm HG in hypertensive participants. < 130/79mm HG in hypertensive participants with diabetes, heart failure or chronic kidney disease.;Long Term: Maintenance of blood pressure at goal levels.    Lipids  Yes    Intervention  Provide education and support for participant on nutrition & aerobic/resistive exercise along with prescribed medications to achieve LDL 70mg , HDL >40mg .    Expected Outcomes  Short Term: Participant states understanding of desired cholesterol values and is compliant with  medications prescribed. Participant is following exercise prescription and nutrition guidelines.;Long Term: Cholesterol controlled with medications as prescribed, with individualized exercise RX and with personalized nutrition plan. Value goals: LDL < 70mg , HDL > 40 mg.    Stress  Yes Unable to relax- thoughts always going    Intervention  Offer individual and/or small group education and counseling on adjustment to heart disease, stress management and health-related lifestyle change. Teach and support self-help strategies.;Refer participants experiencing significant psychosocial distress to appropriate mental health specialists for further evaluation and treatment. When possible, include family members and significant others in education/counseling sessions.    Expected Outcomes  Short Term: Participant demonstrates changes in health-related behavior, relaxation and other stress management skills, ability to obtain effective social support, and compliance with psychotropic medications if prescribed.;Long Term: Emotional wellbeing is indicated by absence of clinically significant psychosocial distress or social isolation.       Tobacco Use Initial Evaluation: Social History   Tobacco Use  Smoking Status Never Smoker  Smokeless Tobacco Never Used    Exercise Goals and Review: Exercise Goals  [place] Name 09/03/17 1529             Exercise Goals   Increase Physical Activity  Yes       Intervention  Provide advice, education, support and counseling about physical activity/exercise needs.;Develop an individualized exercise prescription for aerobic and resistive training based on initial evaluation findings, risk stratification, comorbidities and participant's personal goals.       Expected Outcomes  Short Term: Attend rehab on a regular basis to increase amount of physical activity.;Long Term: Add in home exercise to make exercise part of routine and to increase amount of physical activity.;Long  Term: Exercising regularly at least 3-5 days a week.       Increase Strength and Stamina  Yes       Intervention  Provide advice, education, support and counseling about physical activity/exercise needs.;Develop an individualized exercise prescription for aerobic and resistive training based on initial evaluation findings, risk stratification, comorbidities and participant's personal goals.       Expected Outcomes  Short Term: Increase workloads from initial exercise prescription for resistance, speed, and METs.;Short Term: Perform resistance training exercises routinely during rehab and add in resistance training at home;Long Term: Improve cardiorespiratory fitness, muscular endurance and strength as measured by increased METs and functional capacity (6MWT)       Able to understand and use rate of perceived exertion (RPE) scale  Yes       Intervention  Provide education and explanation on how to use RPE scale       Expected Outcomes  Short Term: Able to use RPE daily in rehab to express subjective intensity level;Long Term:  Able to use RPE to guide intensity level when exercising independently       Able to understand and use Dyspnea scale  Yes       Intervention  Provide education and explanation on how to use Dyspnea scale       Expected Outcomes  Short Term: Able to use Dyspnea scale daily in rehab to express subjective sense of shortness of breath during exertion;Long Term: Able to use Dyspnea scale to guide intensity level when exercising independently       Knowledge and understanding of Target Heart Rate Range (THRR)  Yes       Intervention  Provide education and explanation of THRR including how the numbers were predicted and where they are located for reference       Expected Outcomes  Short Term: Able to state/look up THRR;Short Term: Able to use daily as guideline for intensity in rehab;Long Term: Able to use THRR to govern intensity when exercising independently       Able to check pulse  independently  Yes       Intervention  Provide education and demonstration on how to check pulse in carotid and radial arteries.;Review the importance of being able to check your own pulse for safety during independent exercise       Expected Outcomes  Short Term: Able to explain why pulse checking is important during independent exercise;Long Term: Able to check pulse independently and accurately       Understanding of Exercise Prescription  Yes       Intervention  Provide education, explanation, and written materials on patient's individual exercise prescription       Expected Outcomes  Short Term: Able to explain program exercise prescription;Long Term: Able to explain home exercise prescription to exercise independently          Copy of  goals given to participant.

## 2017-09-03 NOTE — Progress Notes (Signed)
Daily Session Note  Patient Details  Name: Daniel Bradley. MRN: [id number] Date of Birth: 1955-09-03 Referring Provider:     Cardiac Rehab from 09/03/2017 in [facility]  Referring Provider  [facility]      Encounter Date: 09/03/2017  Check In: Session Check In - 09/03/17 1152      Check-In   Location  [facility]-Cardiac & Pulmonary Rehab    Staff Present  Renita Papa, RN Vickki Hearing, BA, ACSM CEP, Exercise Physiologist;Caitlain Tweed, RN, BSN, CCRP    Warm-up and Cool-down  Not performed (comment) Medical review completed today    VAD Patient?  No    PAD/SET Patient?  No      Pain Assessment   Currently in Pain?  No/denies        Exercise Prescription Changes - 09/03/17 1500      Response to Exercise   Blood Pressure (Admit)  114/64    Blood Pressure (Exercise)  128/98    Blood Pressure (Exit)  116/64    Heart Rate (Admit)  98 bpm    Heart Rate (Exercise)  117 bpm    Heart Rate (Exit)  88 bpm    Oxygen Saturation (Exit)  98 %    Rating of Perceived Exertion (Exercise)  11       Social History   Tobacco Use  Smoking Status Never Smoker  Smokeless Tobacco Never Used    Goals Met:  Exercise tolerated well Personal goals reviewed No report of cardiac concerns or symptoms  Goals Unmet:  Not Applicable  Comments: Medical review completed   Dr. Emily Filbert is Medical Director for [facility].
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
## 2017-09-09 DIAGNOSIS — Z955 Presence of coronary angioplasty implant and graft: Secondary | ICD-10-CM

## 2017-09-09 LAB — GLUCOSE, CAPILLARY
Glucose-Capillary: 159 mg/dL — ABNORMAL HIGH (ref 70–99)
Glucose-Capillary: 119 mg/dL — ABNORMAL HIGH (ref 70–99)

## 2017-09-09 NOTE — Progress Notes (Signed)
Daily Session Note  Patient Details  Name: Daniel Bradley. MRN: [id number] Date of Birth: 10-28-1955 Referring Provider:     Cardiac Rehab from 09/03/2017 in [facility] Cardiac and Pulmonary Rehab  Referring Provider  [facility]      Encounter Date: 09/09/2017  Check In: Session Check In - 09/09/17 [place] physician immediately available to respond to emergencies  See telemetry face sheet for immediately available ER MD    Location  [facility]-Cardiac & Pulmonary Rehab    Staff Present  Justin Mend RCP,RRT,BSRT;Heath Lark, RN, BSN, CCRP;Jessica Luan Pulling, MA, RCEP, CCRP, Exercise Physiologist    Medication changes reported      No    Fall or balance concerns reported     No    Warm-up and Cool-down  Performed on first and last piece of equipment    Resistance Training Performed  Yes    VAD Patient?  No    PAD/SET Patient?  No      Pain Assessment   Currently in Pain?  No/denies          Social History   Tobacco Use  Smoking Status Never Smoker  Smokeless Tobacco Never Used    Goals Met:  Exercise tolerated well Personal goals reviewed No report of cardiac concerns or symptoms Strength training completed today  Goals Unmet:  Not Applicable  Comments: First full day of exercise!  Patient was oriented to gym and equipment including functions, settings, policies, and procedures.  Patient's individual exercise prescription and treatment plan were reviewed.  All starting workloads were established based on the results of the 6 minute walk test done at initial orientation visit.  The plan for exercise progression was also introduced and progression will be customized based on patient's performance and goals.   Dr. Emily Filbert is Medical Director for [place] and [facility].

## 2017-09-11 ENCOUNTER — Encounter: Payer: BLUE CROSS/BLUE SHIELD | Attending: Internal Medicine

## 2017-09-11 DIAGNOSIS — Z7982 Long term (current) use of aspirin: Secondary | ICD-10-CM | POA: Insufficient documentation

## 2017-09-11 DIAGNOSIS — Z7984 Long term (current) use of oral hypoglycemic drugs: Secondary | ICD-10-CM | POA: Insufficient documentation

## 2017-09-11 DIAGNOSIS — Z8546 Personal history of malignant neoplasm of prostate: Secondary | ICD-10-CM | POA: Insufficient documentation

## 2017-09-11 DIAGNOSIS — Z79899 Other long term (current) drug therapy: Secondary | ICD-10-CM | POA: Insufficient documentation

## 2017-09-11 DIAGNOSIS — Z955 Presence of coronary angioplasty implant and graft: Secondary | ICD-10-CM | POA: Insufficient documentation

## 2017-09-11 DIAGNOSIS — Z7902 Long term (current) use of antithrombotics/antiplatelets: Secondary | ICD-10-CM | POA: Insufficient documentation

## 2017-09-14 ENCOUNTER — Encounter: Payer: BLUE CROSS/BLUE SHIELD | Admitting: *Deleted

## 2017-09-14 DIAGNOSIS — Z955 Presence of coronary angioplasty implant and graft: Secondary | ICD-10-CM | POA: Diagnosis not present

## 2017-09-14 DIAGNOSIS — Z7984 Long term (current) use of oral hypoglycemic drugs: Secondary | ICD-10-CM | POA: Diagnosis not present

## 2017-09-14 DIAGNOSIS — Z7902 Long term (current) use of antithrombotics/antiplatelets: Secondary | ICD-10-CM | POA: Diagnosis not present

## 2017-09-14 DIAGNOSIS — Z79899 Other long term (current) drug therapy: Secondary | ICD-10-CM | POA: Diagnosis not present

## 2017-09-14 DIAGNOSIS — Z7982 Long term (current) use of aspirin: Secondary | ICD-10-CM | POA: Diagnosis not present

## 2017-09-14 DIAGNOSIS — Z8546 Personal history of malignant neoplasm of prostate: Secondary | ICD-10-CM | POA: Diagnosis not present

## 2017-09-14 LAB — GLUCOSE, CAPILLARY
Glucose-Capillary: 161 mg/dL — ABNORMAL HIGH (ref 70–99)
Glucose-Capillary: 131 mg/dL — ABNORMAL HIGH (ref 70–99)

## 2017-09-14 NOTE — Progress Notes (Signed)
Daily Session Note  Patient Details  Name: Daniel Bradley. MRN: [id number] Date of Birth: [birth year] Referring Provider:     Cardiac Rehab from 09/03/2017 in [facility] Cardiac and Pulmonary Rehab  Referring Provider  [facility]      Encounter Date: 09/14/2017  Check In: Session Check In - 09/14/17 0758      Check-In   Supervising physician immediately available to respond to emergencies  See telemetry face sheet for immediately available ER MD    Location  [facility]-Cardiac & Pulmonary Rehab    Staff Present  Heath Lark, RN, BSN, CCRP;Jessica [place], MA, RCEP, CCRP, Exercise Physiologist;Toussaint Golson [place], BS, ACSM CEP, Exercise Physiologist    Medication changes reported      No    Fall or balance concerns reported     No    Tobacco Cessation  No Change    Warm-up and Cool-down  Performed on first and last piece of equipment    Resistance Training Performed  Yes    VAD Patient?  No    PAD/SET Patient?  No      Pain Assessment   Currently in Pain?  No/denies    Multiple Pain Sites  No          Social History   Tobacco Use  Smoking Status Never Smoker  Smokeless Tobacco Never Used    Goals Met:  Independence with exercise equipment Exercise tolerated well No report of cardiac concerns or symptoms Strength training completed today  Goals Unmet:  Not Applicable  Comments: Pt able to follow exercise prescription today without complaint.  Will continue to monitor for progression.  Reviewed home exercise with pt today.  Pt plans to go to [employer] for exercise.  Reviewed THR, pulse, RPE, sign and symptoms, NTG use, and when to call 911 or MD.  Also discussed weather considerations and indoor options.  Pt voiced understanding.     Dr. Emily Filbert is Medical Director for [place] and [facility].

## 2017-09-16 ENCOUNTER — Encounter: Payer: BLUE CROSS/BLUE SHIELD | Admitting: *Deleted

## 2017-09-16 DIAGNOSIS — Z955 Presence of coronary angioplasty implant and graft: Secondary | ICD-10-CM | POA: Diagnosis not present

## 2017-09-16 LAB — GLUCOSE, CAPILLARY
Glucose-Capillary: 176 mg/dL — ABNORMAL HIGH (ref 70–99)
Glucose-Capillary: 139 mg/dL — ABNORMAL HIGH (ref 70–99)

## 2017-09-16 NOTE — Progress Notes (Signed)
Daily Session Note  Patient Details  Name: Daniel Bradley. MRN: [id number] Date of Birth: 11/15/1955 Referring Provider:     Cardiac Rehab from 09/03/2017 in [facility] Cardiac and Pulmonary Rehab  Referring Provider  [facility]      Encounter Date: 09/16/2017  Check In: Session Check In - 09/16/17 0819      Check-In   Supervising physician immediately available to respond to emergencies  See telemetry face sheet for immediately available ER MD    Location  [facility]-Cardiac & Pulmonary Rehab    Staff Present  Heath Lark, RN, BSN, CCRP;Wayman Hoard [place], MA, RCEP, CCRP, Exercise Physiologist;Joseph Tessie Fass RCP,RRT,BSRT    Medication changes reported      No    Fall or balance concerns reported     No    Warm-up and Cool-down  Performed on first and last piece of equipment    Resistance Training Performed  Yes    VAD Patient?  No      Pain Assessment   Currently in Pain?  No/denies        Exercise Prescription Changes - 09/15/17 1600      Response to Exercise   Blood Pressure (Admit)  122/70    Blood Pressure (Exercise)  142/80    Blood Pressure (Exit)  104/70    Heart Rate (Admit)  113 bpm    Heart Rate (Exercise)  115 bpm    Heart Rate (Exit)  99 bpm    Rating of Perceived Exertion (Exercise)  13    Symptoms  sciattica on right side    Comments  second full day of exercise    Duration  Progress to 45 minutes of aerobic exercise without signs/symptoms of physical distress    Intensity  THRR unchanged      Progression   Progression  Continue to progress workloads to maintain intensity without signs/symptoms of physical distress.    Average METs  2.37      Resistance Training   Training Prescription  Yes    Weight  3 lbs    Reps  10-15      Interval Training   Interval Training  No      Treadmill   MPH  2.4    Grade  0    Minutes  15    METs  2.84      T5 Nustep   Level  2    Minutes  15    METs  1.9      Home Exercise Plan   Plans to continue exercise at   [employer] (comment) [employer]    Frequency  Add 3 additional days to program exercise sessions.    Initial Home Exercises Provided  09/14/17       Social History   Tobacco Use  Smoking Status Never Smoker  Smokeless Tobacco Never Used    Goals Met:  Independence with exercise equipment Exercise tolerated well No report of cardiac concerns or symptoms Strength training completed today  Goals Unmet:  Not Applicable  Comments: Pt able to follow exercise prescription today without complaint.  Will continue to monitor for progression.    Dr. Emily Filbert is Medical Director for [place] and [facility].

## 2017-09-18 DIAGNOSIS — Z955 Presence of coronary angioplasty implant and graft: Secondary | ICD-10-CM

## 2017-09-18 NOTE — Progress Notes (Signed)
Daily Session Note  Patient Details  Name: Daniel Bradley. MRN: [id number] Date of Birth: 1955/10/13 Referring Provider:     Cardiac Rehab from 09/03/2017 in [facility] Cardiac and Pulmonary Rehab  Referring Provider  [facility]      Encounter Date: 09/18/2017  Check In:      Social History   Tobacco Use  Smoking Status Never Smoker  Smokeless Tobacco Never Used    Goals Met:  Independence with exercise equipment Exercise tolerated well No report of cardiac concerns or symptoms Strength training completed today  Goals Unmet:  Not Applicable  Comments: Pt able to follow exercise prescription today without complaint.  Will continue to monitor for progression.    Dr. Emily Filbert is Medical Director for [place] and [facility].

## 2017-09-21 ENCOUNTER — Encounter: Payer: BLUE CROSS/BLUE SHIELD | Admitting: *Deleted

## 2017-09-21 DIAGNOSIS — Z955 Presence of coronary angioplasty implant and graft: Secondary | ICD-10-CM | POA: Diagnosis not present

## 2017-09-21 NOTE — Progress Notes (Signed)
Daily Session Note  Patient Details  Name: Daniel Bradley. MRN: [id number] Date of Birth: 01/31/1956 Referring Provider:     Cardiac Rehab from 09/03/2017 in [facility] Cardiac and Pulmonary Rehab  Referring Provider  [facility]      Encounter Date: 09/21/2017  Check In: Session Check In - 09/21/17 0742      Check-In   Supervising physician immediately available to respond to emergencies  See telemetry face sheet for immediately available ER MD    Location  [facility]-Cardiac & Pulmonary Rehab    Staff Present  Heath Lark, RN, BSN, CCRP;Shareen Capwell [place], MA, RCEP, CCRP, Exercise Physiologist;Kelly Amedeo [place], BS, ACSM CEP, Exercise Physiologist    Medication changes reported      No    Fall or balance concerns reported     No    Warm-up and Cool-down  Performed on first and last piece of equipment    Resistance Training Performed  Yes    VAD Patient?  No    PAD/SET Patient?  No      Pain Assessment   Currently in Pain?  No/denies          Social History   Tobacco Use  Smoking Status Never Smoker  Smokeless Tobacco Never Used    Goals Met:  Independence with exercise equipment Exercise tolerated well No report of cardiac concerns or symptoms Strength training completed today  Goals Unmet:  Not Applicable  Comments: Pt able to follow exercise prescription today without complaint.  Will continue to monitor for progression.    Dr. Emily Filbert is Medical Director for [place] and [facility].

## 2017-09-23 ENCOUNTER — Encounter: Payer: Self-pay | Admitting: *Deleted

## 2017-09-23 ENCOUNTER — Encounter: Payer: BLUE CROSS/BLUE SHIELD | Admitting: *Deleted

## 2017-09-23 DIAGNOSIS — Z955 Presence of coronary angioplasty implant and graft: Secondary | ICD-10-CM | POA: Diagnosis not present

## 2017-09-23 NOTE — Progress Notes (Signed)
Daily Session Note  Patient Details  Name: Daniel Bradley. MRN: [id number] Date of Birth: [birth year] Referring Provider:     Cardiac Rehab from 09/03/2017 in [facility] Cardiac and Pulmonary Rehab  Referring Provider  [facility]      Encounter Date: 09/23/2017  Check In: Session Check In - 09/23/17 0726      Check-In   Supervising physician immediately available to respond to emergencies  See telemetry face sheet for immediately available ER MD    Location  [facility]-Cardiac & Pulmonary Rehab    Staff Present  Nyoka Cowden, RN, BSN, Cheri Fowler, RN BSN;Siris Hoos Luan Pulling, MA, RCEP, CCRP, Exercise Physiologist    Medication changes reported      No    Fall or balance concerns reported     No    Warm-up and Cool-down  Performed on first and last piece of equipment    Resistance Training Performed  Yes    VAD Patient?  No    PAD/SET Patient?  No      Pain Assessment   Currently in Pain?  No/denies          Social History   Tobacco Use  Smoking Status Never Smoker  Smokeless Tobacco Never Used    Goals Met:  Independence with exercise equipment Exercise tolerated well No report of cardiac concerns or symptoms Strength training completed today  Goals Unmet:  Not Applicable  Comments: Pt able to follow exercise prescription today without complaint.  Will continue to monitor for progression.    Dr. Emily Filbert is Medical Director for [place] and [facility].

## 2017-09-23 NOTE — Progress Notes (Signed)
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   Assessment shows need for Vocational Rehabilitation  No       Education: Education Goals: Education classes will be provided on a variety of topics geared toward better understanding of heart health and risk factor modification. Participant will state understanding/return demonstration of topics presented as noted by education test scores.  Learning Barriers/Preferences: Learning Barriers/Preferences - 09/03/17 1341      Learning Barriers/Preferences   Learning Barriers  None    Learning Preferences  None       Education Topics:  AED/CPR: - Group verbal and written instruction with the use of models to demonstrate the basic use of the AED with the basic ABC's of resuscitation.   General Nutrition Guidelines/Fats and Fiber: -Group instruction provided by verbal, written material, models and  posters to present the general guidelines for heart healthy nutrition. Gives an explanation and review of dietary fats and fiber.   Controlling Sodium/Reading Food Labels: -Group verbal and written material supporting the discussion of sodium use in heart healthy nutrition. Review and explanation with models, verbal and written materials for utilization of the food label.   Exercise Physiology & General Exercise Guidelines: - Group verbal and written instruction with models to review the exercise physiology of the cardiovascular system and associated critical values. Provides general exercise guidelines with specific guidelines to those with heart or lung disease.    Cardiac Rehab from 09/23/2017 in [facility] Cardiac and Pulmonary Rehab  Date  09/09/17  Educator  [facility]  Instruction Review Code  1- Verbalizes Understanding      Aerobic Exercise & Resistance Training: - Gives group verbal and written instruction on the various components of exercise. Focuses on aerobic and resistive training programs and the benefits of this training and how to safely progress through these programs..   Cardiac Rehab from 09/23/2017 in [facility] Cardiac and Pulmonary Rehab  Date  09/14/17  Educator  [facility]  Instruction Review Code  1- Verbalizes Understanding      Flexibility, Balance, Mind/Body Relaxation: Provides group verbal/written instruction on the benefits of flexibility and balance training, including mind/body exercise modes such as yoga, pilates and tai chi.  Demonstration and skill practice provided.   Cardiac Rehab from 09/23/2017 in [facility] Cardiac and Pulmonary Rehab  Date  09/21/17  Educator  [facility]  Instruction Review Code  1- Verbalizes Understanding      Stress and Anxiety: - Provides group verbal and written instruction about the health risks of elevated stress and causes of high stress.  Discuss the correlation between heart/lung disease and anxiety and treatment options. Review healthy ways to manage with  stress and anxiety.   Depression: - Provides group verbal and written instruction on the correlation between heart/lung disease and depressed mood, treatment options, and  the stigmas associated with seeking treatment.   Cardiac Rehab from 09/23/2017 in [facility] Cardiac and Pulmonary Rehab  Date  09/16/17  Educator  [facility]  Instruction Review Code  1- Verbalizes Understanding      Anatomy & Physiology of the Heart: - Group verbal and written instruction and models provide basic cardiac anatomy and physiology, with the coronary electrical and arterial systems. Review of Valvular disease and Heart Failure   Cardiac Procedures: - Group verbal and written instruction to review commonly prescribed medications for heart disease. Reviews the medication, class of the drug, and side effects. Includes the steps to properly store meds and maintain the prescription regimen. (beta blockers and nitrates)   Cardiac Medications I: - Group verbal and written instruction to review commonly prescribed medications for heart disease. Reviews the medication, class of the drug, and side effects. Includes the steps to properly store meds and maintain the prescription regimen.   Cardiac Medications II: -Group verbal and written instruction to review commonly prescribed medications for heart disease. Reviews the medication, class of the drug, and side effects. (all other drug classes)   Cardiac Rehab from 09/23/2017 in [facility] Cardiac and Pulmonary Rehab  Date  09/23/17  Educator  [place]  Instruction Review Code  1- Verbalizes Understanding       Go Sex-Intimacy & Heart Disease, Get SMART - Goal Setting: - Group verbal and written instruction through game format to discuss heart disease and the return to sexual intimacy. Provides group verbal and written material to discuss and apply goal setting through the application of the S.M.A.R.T. Method.   Other Matters of the Heart: - Provides group verbal, written materials and  models to describe Stable Angina and Peripheral Artery. Includes description of the disease process and treatment options available to the cardiac patient.   Exercise & Equipment Safety: - Individual verbal instruction and demonstration of equipment use and safety with use of the equipment.   Cardiac Rehab from 09/23/2017 in [facility] Cardiac and Pulmonary Rehab  Date  09/03/17  Educator  [facility]  Instruction Review Code  1- Verbalizes Understanding      Infection Prevention: - Provides verbal and written material to individual with discussion of infection control including proper hand washing and proper equipment cleaning during exercise session.   Cardiac Rehab from 09/23/2017 in [facility] Cardiac and Pulmonary Rehab  Date  09/03/17  Educator  [facility]  Instruction Review Code  1- Verbalizes Understanding      Falls Prevention: - Provides verbal and written material to individual with discussion of falls prevention and safety.   Cardiac Rehab from 09/23/2017 in [facility] Cardiac and Pulmonary Rehab  Date  09/03/17  Educator  [facility]  Instruction Review Code  1- Verbalizes Understanding      Diabetes: - Individual verbal and written instruction to review signs/symptoms of diabetes, desired ranges of glucose level fasting, after meals and with exercise. Acknowledge that pre and post exercise glucose checks will be done for 3 sessions at entry of program.   Cardiac Rehab from 09/23/2017 in [facility] Cardiac and Pulmonary Rehab  Date  09/03/17  Educator  [place]  Instruction Review Code  1- Verbalizes Understanding      Know Your Numbers and Risk Factors: -Group verbal and written instruction about important numbers in your health.  Discussion of what are risk factors and how they play a role in the disease process.  Review of Cholesterol, Blood Pressure, Diabetes, and BMI and the role they play in your overall health.  Cardiac Rehab from 09/23/2017 in [facility] Cardiac and Pulmonary Rehab  Date  09/23/17  Educator  [place]   Instruction Review Code  1- Verbalizes Understanding      Sleep Hygiene: -Provides group verbal and written instruction about how sleep can affect your health.  Define sleep hygiene, discuss sleep cycles and impact of sleep habits. Review good sleep hygiene tips.    Other: -Provides group and verbal instruction on various topics (see comments)   Knowledge Questionnaire Score: Knowledge Questionnaire Score - 09/03/17 1201      Knowledge Questionnaire Score   Pre Score  22/26   Correct response reviewed with Abron. He vebalized understanding of the responses and had no further questions.      Core Components/Risk Factors/Patient Goals at Admission: Personal Goals and Risk Factors at Admission - 09/03/17 1329      Core Components/Risk Factors/Patient Goals on Admission    Weight Management  Yes;Obesity;Weight Loss    Intervention  Weight Management: Develop a combined nutrition and exercise program designed to reach desired caloric intake, while maintaining appropriate intake of nutrient and fiber, sodium and fats, and appropriate energy expenditure required for the weight goal.    Admit Weight  280 lb 12.8 oz (127.4 kg)    Goal Weight: Short Term  278 lb (126.1 kg)    Goal Weight: Long Term  190 lb (86.2 kg)    Expected Outcomes  Short Term: Continue to assess and modify interventions until short term weight is achieved;Long Term: Adherence to nutrition and physical activity/exercise program aimed toward attainment of established weight goal;Weight Loss: Understanding of general recommendations for a balanced deficit meal plan, which promotes 1-2 lb weight loss per week and includes a negative energy balance of [phone] kcal/d    Diabetes  Yes    Intervention  Provide education about signs/symptoms and action to take for hypo/hyperglycemia.;Provide education about proper nutrition, including hydration, and aerobic/resistive exercise prescription along with prescribed medications to  achieve blood glucose in normal ranges: Fasting glucose 65-99 mg/dL    Expected Outcomes  Short Term: Participant verbalizes understanding of the signs/symptoms and immediate care of hyper/hypoglycemia, proper foot care and importance of medication, aerobic/resistive exercise and nutrition plan for blood glucose control.;Long Term: Attainment of HbA1C < 7%.    Hypertension  Yes    Intervention  Provide education on lifestyle modifcations including regular physical activity/exercise, weight management, moderate sodium restriction and increased consumption of fresh fruit, vegetables, and low fat dairy, alcohol moderation, and smoking cessation.;Monitor prescription use compliance.    Expected Outcomes  Short Term: Continued assessment and intervention until BP is < 140/33m HG in hypertensive participants. < 130/855mHG in hypertensive participants with diabetes, heart failure or chronic kidney disease.;Long Term: Maintenance of blood pressure at goal levels.    Lipids  Yes    Intervention  Provide education and support for participant on nutrition & aerobic/resistive exercise along with prescribed medications to achieve LDL '70mg'$ , HDL >'40mg'$ .    Expected Outcomes  Short Term: Participant states understanding of desired cholesterol values and is compliant with medications prescribed. Participant is following exercise prescription and nutrition guidelines.;Long Term: Cholesterol controlled with medications as prescribed, with individualized exercise RX and with personalized nutrition plan. Value goals: LDL < '70mg'$ , HDL > 40 mg.    Stress  Yes   Unable to relax- thoughts always going   Intervention  Offer individual and/or small group education and counseling on adjustment to heart disease, stress management and health-related lifestyle change. Teach and  support self-help strategies.;Refer participants experiencing significant psychosocial distress to appropriate mental health specialists for further evaluation  and treatment. When possible, include family members and significant others in education/counseling sessions.    Expected Outcomes  Short Term: Participant demonstrates changes in health-related behavior, relaxation and other stress management skills, ability to obtain effective social support, and compliance with psychotropic medications if prescribed.;Long Term: Emotional wellbeing is indicated by absence of clinically significant psychosocial distress or social isolation.       Core Components/Risk Factors/Patient Goals Review:    Core Components/Risk Factors/Patient Goals at Discharge (Final Review):    ITP Comments: ITP Comments    Row Name 09/03/17 1351 09/23/17 0759         ITP Comments  Medical review completed today. ITP sent to Dr Loleta Chance for review, changes as needed and signature.  Documentation of diagnosis can be found in [facility] 7/10 Encounter  30 day review completed. ITP sent to Dr. Ramonita Lab, covering for Dr. Emily Filbert, Medical Director of Cardiac Rehab. Continue with ITP unless changes are made by physician.  New to program         Comments: 30 day review

## 2017-09-25 ENCOUNTER — Encounter: Payer: BLUE CROSS/BLUE SHIELD | Admitting: *Deleted

## 2017-09-25 DIAGNOSIS — Z955 Presence of coronary angioplasty implant and graft: Secondary | ICD-10-CM | POA: Diagnosis not present

## 2017-09-25 NOTE — Progress Notes (Signed)
Daily Session Note  Patient Details  Name: Daniel Bradley. MRN: [id number] Date of Birth: [birth year] Referring Provider:     Cardiac Rehab from 09/03/2017 in [facility] Cardiac and Pulmonary Rehab  Referring Provider  [facility]      Encounter Date: 09/25/2017  Check In: Session Check In - 09/25/17 0824      Check-In   Supervising physician immediately available to respond to emergencies  See telemetry face sheet for immediately available ER MD    Location  [facility]-Cardiac & Pulmonary Rehab    Staff Present  Renita Papa, RN BSN;Ismaeel Arvelo Luan Pulling, MA, RCEP, CCRP, Exercise Physiologist;Amanda Oletta Darter, [place], ACSM CEP, Exercise Physiologist    Medication changes reported      No    Fall or balance concerns reported     No    Warm-up and Cool-down  Performed on first and last piece of equipment    Resistance Training Performed  Yes    VAD Patient?  No    PAD/SET Patient?  No      Pain Assessment   Currently in Pain?  No/denies          Social History   Tobacco Use  Smoking Status Never Smoker  Smokeless Tobacco Never Used    Goals Met:  Independence with exercise equipment Exercise tolerated well No report of cardiac concerns or symptoms Strength training completed today  Goals Unmet:  Not Applicable  Comments: Pt able to follow exercise prescription today without complaint.  Will continue to monitor for progression.    Dr. Emily Filbert is Medical Director for [place] and [facility].

## 2017-10-01 ENCOUNTER — Ambulatory Visit: Payer: BLUE CROSS/BLUE SHIELD | Attending: Otolaryngology

## 2017-10-01 DIAGNOSIS — I10 Essential (primary) hypertension: Secondary | ICD-10-CM | POA: Insufficient documentation

## 2017-10-01 DIAGNOSIS — I252 Old myocardial infarction: Secondary | ICD-10-CM | POA: Insufficient documentation

## 2017-10-01 DIAGNOSIS — E119 Type 2 diabetes mellitus without complications: Secondary | ICD-10-CM | POA: Insufficient documentation

## 2017-10-01 DIAGNOSIS — G4733 Obstructive sleep apnea (adult) (pediatric): Secondary | ICD-10-CM | POA: Diagnosis present

## 2017-10-01 DIAGNOSIS — E669 Obesity, unspecified: Secondary | ICD-10-CM | POA: Diagnosis not present

## 2017-10-01 DIAGNOSIS — F5101 Primary insomnia: Secondary | ICD-10-CM | POA: Insufficient documentation

## 2017-10-01 DIAGNOSIS — Z6837 Body mass index (BMI) 37.0-37.9, adult: Secondary | ICD-10-CM | POA: Insufficient documentation

## 2017-10-02 ENCOUNTER — Telehealth: Payer: Self-pay | Admitting: *Deleted

## 2017-10-02 NOTE — Telephone Encounter (Signed)
Daniel Bradley left a message that he had a sleep study last night and doctor's appointments this week.  He hopes to return on Monday.

## 2017-10-05 ENCOUNTER — Encounter: Payer: BLUE CROSS/BLUE SHIELD | Admitting: *Deleted

## 2017-10-05 DIAGNOSIS — Z955 Presence of coronary angioplasty implant and graft: Secondary | ICD-10-CM | POA: Diagnosis not present

## 2017-10-05 NOTE — Progress Notes (Signed)
Daily Session Note  Patient Details  Name: Daniel Bradley. MRN: [id number] Date of Birth: 1955-12-15 Referring Provider:     Cardiac Rehab from 09/03/2017 in [facility] Cardiac and Pulmonary Rehab  Referring Provider  [facility]      Encounter Date: 10/05/2017  Check In: Session Check In - 10/05/17 0807      Check-In   Supervising physician immediately available to respond to emergencies  See telemetry face sheet for immediately available ER MD    Location  [facility]-Cardiac & Pulmonary Rehab    Staff Present  Earlean Shawl, BS, ACSM CEP, Exercise Physiologist;Susanne Bice, RN, BSN, CCRP;Jessica Chester, MA, RCEP, CCRP, Exercise Physiologist    Medication changes reported      No    Fall or balance concerns reported     No    Tobacco Cessation  No Change    Warm-up and Cool-down  Performed on first and last piece of equipment    Resistance Training Performed  Yes    VAD Patient?  No    PAD/SET Patient?  No      Pain Assessment   Currently in Pain?  No/denies    Multiple Pain Sites  No          Social History   Tobacco Use  Smoking Status Never Smoker  Smokeless Tobacco Never Used    Goals Met:  Independence with exercise equipment Exercise tolerated well Personal goals reviewed No report of cardiac concerns or symptoms Strength training completed today  Goals Unmet:  Not Applicable  Comments: Pt able to follow exercise prescription today without complaint.  Will continue to monitor for progression.    Dr. Emily Filbert is Medical Director for [place] and [facility].

## 2017-10-07 DIAGNOSIS — Z955 Presence of coronary angioplasty implant and graft: Secondary | ICD-10-CM | POA: Diagnosis not present

## 2017-10-07 NOTE — Progress Notes (Signed)
Daily Session Note  Patient Details  Name: Daniel Bradley. MRN: [id number] Date of Birth: 1955-12-19 Referring Provider:     Cardiac Rehab from 09/03/2017 in [facility] Cardiac and Pulmonary Rehab  Referring Provider  [facility]      Encounter Date: 10/07/2017  Check In: Session Check In - 10/07/17 0739      Check-In   Supervising physician immediately available to respond to emergencies  See telemetry face sheet for immediately available ER MD    Location  [facility]-Cardiac & Pulmonary Rehab    Staff Present  Daniel [name], MA, RCEP, CCRP, Exercise Physiologist;Daniel Martelle Christain [place], RN BSN    Medication changes reported      No    Fall or balance concerns reported     No    Warm-up and Cool-down  Performed as group-led Higher education careers adviser Performed  Yes    VAD Patient?  No      Pain Assessment   Currently in Pain?  No/denies          Social History   Tobacco Use  Smoking Status Never Smoker  Smokeless Tobacco Never Used    Goals Met:  Independence with exercise equipment Exercise tolerated well No report of cardiac concerns or symptoms Strength training completed today  Goals Unmet:  Not Applicable  Comments: Pt able to follow exercise prescription today without complaint.  Will continue to monitor for progression.   Dr. Emily Bradley is Medical Director for [place] and [facility].

## 2017-10-09 ENCOUNTER — Encounter: Payer: BLUE CROSS/BLUE SHIELD | Admitting: *Deleted

## 2017-10-09 DIAGNOSIS — Z955 Presence of coronary angioplasty implant and graft: Secondary | ICD-10-CM

## 2017-10-09 NOTE — Progress Notes (Signed)
Daily Session Note  Patient Details  Name: Daniel Bradley. MRN: [id number] Date of Birth: 1955-11-01 Referring Provider:     Cardiac Rehab from 09/03/2017 in [facility] Cardiac and Pulmonary Rehab  Referring Provider  [facility]      Encounter Date: 10/09/2017  Check In: Session Check In - 10/09/17 0806      Check-In   Supervising physician immediately available to respond to emergencies  See telemetry face sheet for immediately available ER MD    Location  [facility]-Cardiac & Pulmonary Rehab    Staff Present  Nyoka Cowden, RN, BSN, Willette Pa, MA, RCEP, CCRP, Exercise Physiologist;Amanda Oletta Darter, [place], ACSM CEP, Exercise Physiologist    Medication changes reported      No    Fall or balance concerns reported     No    Warm-up and Cool-down  Performed on first and last piece of equipment    Resistance Training Performed  Yes    VAD Patient?  No    PAD/SET Patient?  No      Pain Assessment   Currently in Pain?  No/denies          Social History   Tobacco Use  Smoking Status Never Smoker  Smokeless Tobacco Never Used    Goals Met:  Independence with exercise equipment Exercise tolerated well No report of cardiac concerns or symptoms Strength training completed today  Goals Unmet:  Not Applicable  Comments: Pt able to follow exercise prescription today without complaint.  Will continue to monitor for progression.    Dr. Emily Filbert is Medical Director for [place] [facility].

## 2017-10-14 ENCOUNTER — Encounter: Payer: BLUE CROSS/BLUE SHIELD | Attending: Internal Medicine

## 2017-10-14 DIAGNOSIS — Z955 Presence of coronary angioplasty implant and graft: Secondary | ICD-10-CM | POA: Insufficient documentation

## 2017-10-14 DIAGNOSIS — Z79899 Other long term (current) drug therapy: Secondary | ICD-10-CM | POA: Insufficient documentation

## 2017-10-14 DIAGNOSIS — Z7982 Long term (current) use of aspirin: Secondary | ICD-10-CM | POA: Insufficient documentation

## 2017-10-14 DIAGNOSIS — Z7902 Long term (current) use of antithrombotics/antiplatelets: Secondary | ICD-10-CM | POA: Insufficient documentation

## 2017-10-14 DIAGNOSIS — Z7984 Long term (current) use of oral hypoglycemic drugs: Secondary | ICD-10-CM | POA: Insufficient documentation

## 2017-10-14 DIAGNOSIS — Z8546 Personal history of malignant neoplasm of prostate: Secondary | ICD-10-CM | POA: Insufficient documentation

## 2017-10-16 DIAGNOSIS — Z8546 Personal history of malignant neoplasm of prostate: Secondary | ICD-10-CM | POA: Diagnosis not present

## 2017-10-16 DIAGNOSIS — Z7982 Long term (current) use of aspirin: Secondary | ICD-10-CM | POA: Diagnosis not present

## 2017-10-16 DIAGNOSIS — Z79899 Other long term (current) drug therapy: Secondary | ICD-10-CM | POA: Diagnosis not present

## 2017-10-16 DIAGNOSIS — Z7984 Long term (current) use of oral hypoglycemic drugs: Secondary | ICD-10-CM | POA: Diagnosis not present

## 2017-10-16 DIAGNOSIS — Z955 Presence of coronary angioplasty implant and graft: Secondary | ICD-10-CM

## 2017-10-16 DIAGNOSIS — Z7902 Long term (current) use of antithrombotics/antiplatelets: Secondary | ICD-10-CM | POA: Diagnosis not present

## 2017-10-16 NOTE — Progress Notes (Signed)
Daily Session Note  Patient Details  Name: Daniel Bradley. MRN: [id number] Date of Birth: 28-Sep-1955 Referring Provider:     Cardiac Rehab from 09/03/2017 in [facility] Cardiac and Pulmonary Rehab  Referring Provider  [facility]      Encounter Date: 10/16/2017  Check In:      Social History   Tobacco Use  Smoking Status Never Smoker  Smokeless Tobacco Never Used    Goals Met:  Independence with exercise equipment Exercise tolerated well No report of cardiac concerns or symptoms Strength training completed today  Goals Unmet:  Not Applicable  Comments: Pt able to follow exercise prescription today without complaint.  Will continue to monitor for progression.    Dr. Emily Filbert is Medical Director for [place] and [facility].

## 2017-10-19 ENCOUNTER — Encounter: Payer: BLUE CROSS/BLUE SHIELD | Admitting: *Deleted

## 2017-10-19 DIAGNOSIS — Z955 Presence of coronary angioplasty implant and graft: Secondary | ICD-10-CM | POA: Diagnosis not present

## 2017-10-19 NOTE — Progress Notes (Signed)
Daily Session Note  Patient Details  Name: Daniel Haddon Jr. MRN: [id number] Date of Birth: 12/28/1955 Referring Provider:     Cardiac Rehab from 09/03/2017 in [facility] Cardiac and Pulmonary Rehab  Referring Provider  [place]      Encounter Date: 10/19/2017  Check In: Session Check In - 10/19/17 0841      Check-In   Supervising physician immediately available to respond to emergencies  See telemetry face sheet for immediately available ER MD    Location  [facility]-Cardiac & Pulmonary Rehab    Staff Present  Kelly Hayes, BS, ACSM CEP, Exercise Physiologist;Susanne Bice, RN, BSN, CCRP;Jessica Hawkins, MA, RCEP, CCRP, Exercise Physiologist    Medication changes reported      No    Fall or balance concerns reported     No    Warm-up and Cool-down  Performed on first and last piece of equipment    Resistance Training Performed  Yes    VAD Patient?  No    PAD/SET Patient?  No      Pain Assessment   Currently in Pain?  No/denies          Social History   Tobacco Use  Smoking Status Never Smoker  Smokeless Tobacco Never Used    Goals Met:  Independence with exercise equipment Exercise tolerated well No report of cardiac concerns or symptoms Strength training completed today  Goals Unmet:  Not Applicable  Comments: Pt able to follow exercise prescription today without complaint.  Will continue to monitor for progression.    Dr. Mark Miller is Medical Director for [facility] and [facility]. 

## 2017-10-21 ENCOUNTER — Encounter: Payer: Self-pay | Admitting: *Deleted

## 2017-10-21 DIAGNOSIS — Z955 Presence of coronary angioplasty implant and graft: Secondary | ICD-10-CM

## 2017-10-21 NOTE — Progress Notes (Signed)
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 1002 09/23/17 0922 10/05/17 0911         Psychosocial Re-Evaluation   Current issues with  Current Sleep Concerns  Current Sleep Concerns  Current Stress Concerns;Current Sleep Concerns     Comments  Counselor follow up this morning with Rollen reporting he has realized his sleep may be related to one of his medications that he is scheduled to discontinue.  It causes his heart to "race" like he is "on speed" which keeps him from sleeping well.  He reports this class has been positive for him with increased energy and stregnth.  He also reports his mood has improved as well and he is learning a great deal.  Counselor commended Charilie for his progress made and commitment to an improved quality of life.    Counselor follow up with Eduard Clos today on the grief support group information and he reports not being able to look  into this due to family visiting for the past 2 weeks.  He also is excited that as of today he is no longer on the medication that causes his heart to race and is likely contributing to his sleep issues.  Counselor will continue to follow with him.   Neeko got a shot for his back and pinched nerve.  He is feeling better and able to move beter. He is starting to feel better and has come off Birlintta and changed to plavix.  He is sleeping better and no longer feels like he needs the support group.  He is sleeping better since switching his medicaiton. He isfeeling better overall with the exercise and now has the ability to move again.      Expected Outcomes  Short:  Devlyn will discontinue his medication as directed and hopefully improve his quality of sleep with the new medication.  If not, he will speak to his Dr. about this.  Long:  Makye will continue to exercise to achieve his stated goals.    Short:  Yuvraj will research the grief support groups as needed.  He will also follow Dr's orders on D/C of medications and introduction of new ones.  Long:  Byrd will continue to make positive quality of life choices for his health and mental health.   Short: Continue to get moving more.  Long: Continue to stay postive and practice self care.      Interventions  -  -  Encouraged to attend Cardiac Rehabilitation for the exercise     Continue Psychosocial Services   Follow up required by staff  Follow up required by staff  Follow up required by staff       Initial Review   Source of Stress Concerns  -  -  Chronic Illness        Psychosocial Discharge (Final Psychosocial Re-Evaluation): Psychosocial Re-Evaluation - 10/05/17 0911      Psychosocial Re-Evaluation   Current issues with  Current Stress Concerns;Current Sleep Concerns  Comments  Eryck got a shot for his back and pinched nerve.  He is feeling better and able to move beter. He is starting to feel better and has come off Birlintta and  changed to plavix.  He is sleeping better and no longer feels like he needs the support group.  He is sleeping better since switching his medicaiton. He isfeeling better overall with the exercise and now has the ability to move again.     Expected Outcomes  Short: Continue to get moving more.  Long: Continue to stay postive and practice self care.     Interventions  Encouraged to attend Cardiac Rehabilitation for the exercise    Continue Psychosocial Services   Follow up required by staff      Initial Review   Source of Stress Concerns  Chronic Illness       Vocational Rehabilitation: Provide vocational rehab assistance to qualifying candidates.   Vocational Rehab Evaluation & Intervention: Vocational Rehab - 09/03/17 1200      Initial Vocational Rehab Evaluation & Intervention   Assessment shows need for Vocational Rehabilitation  No       Education: Education Goals: Education classes will be provided on a variety of topics geared toward better understanding of heart health and risk factor modification. Participant will state understanding/return demonstration of topics presented as noted by education test scores.  Learning Barriers/Preferences: Learning Barriers/Preferences - 09/03/17 1341      Learning Barriers/Preferences   Learning Barriers  None    Learning Preferences  None       Education Topics:  AED/CPR: - Group verbal and written instruction with the use of models to demonstrate the basic use of the AED with the basic ABC's of resuscitation.   General Nutrition Guidelines/Fats and Fiber: -Group instruction provided by verbal, written material, models and posters to present the general guidelines for heart healthy nutrition. Gives an explanation and review of dietary fats and fiber.   Cardiac Rehab from 10/19/2017 in [facility] Cardiac and Pulmonary Rehab  Date  10/19/17  Educator  LB  Instruction Review Code  1- Verbalizes Understanding      Controlling  Sodium/Reading Food Labels: -Group verbal and written material supporting the discussion of sodium use in heart healthy nutrition. Review and explanation with models, verbal and written materials for utilization of the food label.   Exercise Physiology & General Exercise Guidelines: - Group verbal and written instruction with models to review the exercise physiology of the cardiovascular system and associated critical values. Provides general exercise guidelines with specific guidelines to those with heart or lung disease.    Cardiac Rehab from 10/19/2017 in [facility] Cardiac and Pulmonary Rehab  Date  09/09/17  Educator  [facility]  Instruction Review Code  1- Verbalizes Understanding      Aerobic Exercise & Resistance Training: - Gives group verbal and written instruction on the various components of exercise. Focuses on aerobic and resistive training programs and the benefits of this training and how to safely progress through these programs..   Cardiac Rehab from 10/19/2017 in [facility] Cardiac and Pulmonary Rehab  Date  09/14/17  Educator  [facility]  Instruction Review Code  1- Verbalizes Understanding      Flexibility, Balance, Mind/Body Relaxation: Provides group verbal/written instruction on the benefits of flexibility and balance training, including mind/body exercise modes such as yoga, pilates and tai chi.  Demonstration and skill practice provided.   Cardiac Rehab from 10/19/2017 in [facility] Cardiac and Pulmonary Rehab  Date  09/21/17  Educator  [employer]  Instruction Review Code  1- Verbalizes Understanding      Stress and Anxiety: - Provides group verbal and written instruction about the health risks of elevated stress and causes of high stress.  Discuss the correlation between heart/lung disease and anxiety and treatment options. Review healthy ways to manage with stress and anxiety.   Depression: - Provides group verbal and written instruction on the correlation between heart/lung disease and depressed  mood, treatment options, and the stigmas associated with seeking treatment.   Cardiac Rehab from 10/19/2017 in [facility] Cardiac and Pulmonary Rehab  Date  09/16/17  Educator  [facility]  Instruction Review Code  1- Verbalizes Understanding      Anatomy & Physiology of the Heart: - Group verbal and written instruction and models provide basic cardiac anatomy and physiology, with the coronary electrical and arterial systems. Review of Valvular disease and Heart Failure   Cardiac Procedures: - Group verbal and written instruction to review commonly prescribed medications for heart disease. Reviews the medication, class of the drug, and side effects. Includes the steps to properly store meds and maintain the prescription regimen. (beta blockers and nitrates)   Cardiac Medications I: - Group verbal and written instruction to review commonly prescribed medications for heart disease. Reviews the medication, class of the drug, and side effects. Includes the steps to properly store meds and maintain the prescription regimen.   Cardiac Rehab from 10/19/2017 in [facility] Cardiac and Pulmonary Rehab  Date  10/05/17  Educator  SB  Instruction Review Code  1- Verbalizes Understanding      Cardiac Medications II: -Group verbal and written instruction to review commonly prescribed medications for heart disease. Reviews the medication, class of the drug, and side effects. (all other drug classes)   Cardiac Rehab from 10/19/2017 in [facility] Cardiac and Pulmonary Rehab  Date  09/23/17  Educator  [place]  Instruction Review Code  1- Verbalizes Understanding       Go Sex-Intimacy & Heart Disease, Get SMART - Goal Setting: - Group verbal and written instruction through game format to discuss heart disease and the return to sexual intimacy. Provides group verbal and written material to discuss and apply goal setting through the application of the S.M.A.R.T. Method.   Other Matters of the Heart: - Provides group verbal, written  materials and models to describe Stable Angina and Peripheral Artery. Includes description of the disease process and treatment options available to the cardiac patient.   Exercise & Equipment Safety: - Individual verbal instruction and demonstration of equipment use and safety with use of the equipment.   Cardiac Rehab from 10/19/2017 in [facility] Cardiac and Pulmonary Rehab  Date  09/03/17  Educator  [facility]  Instruction Review Code  1- Verbalizes Understanding      Infection Prevention: - Provides verbal and written material to individual with discussion of infection control including proper hand washing and proper equipment cleaning during exercise session.   Cardiac Rehab from 10/19/2017 in [facility] Cardiac and Pulmonary Rehab  Date  09/03/17  Educator  [facility]  Instruction Review Code  1- Verbalizes Understanding      Falls Prevention: - Provides verbal and written material to individual with discussion of falls prevention and safety.   Cardiac Rehab from 10/19/2017 in [facility] Cardiac and Pulmonary Rehab  Date  09/03/17  Educator  [facility]  Instruction Review Code  1- Verbalizes Understanding      Diabetes: - Individual verbal and written instruction to review signs/symptoms of diabetes, desired ranges  of glucose level fasting, after meals and with exercise. Acknowledge that pre and post exercise glucose checks will be done for 3 sessions at entry of program.   Cardiac Rehab from 10/19/2017 in [facility] Cardiac and Pulmonary Rehab  Date  09/03/17  Educator  SB  Instruction Review Code  1- Verbalizes Understanding      Know Your Numbers and Risk Factors: -Group verbal and written instruction about important numbers in your health.  Discussion of what are risk factors and how they play a role in the disease process.  Review of Cholesterol, Blood Pressure, Diabetes, and BMI and the role they play in your overall health.   Cardiac Rehab from 10/19/2017 in [facility] Cardiac and Pulmonary Rehab  Date  09/23/17  Educator   [place]  Instruction Review Code  1- Verbalizes Understanding      Sleep Hygiene: -Provides group verbal and written instruction about how sleep can affect your health.  Define sleep hygiene, discuss sleep cycles and impact of sleep habits. Review good sleep hygiene tips.    Other: -Provides group and verbal instruction on various topics (see comments)   Knowledge Questionnaire Score: Knowledge Questionnaire Score - 09/03/17 1201      Knowledge Questionnaire Score   Pre Score  22/26   Correct response reviewed with Kwane. He vebalized understanding of the responses and had no further questions.      Core Components/Risk Factors/Patient Goals at Admission: Personal Goals and Risk Factors at Admission - 09/03/17 1329      Core Components/Risk Factors/Patient Goals on Admission    Weight Management  Yes;Obesity;Weight Loss    Intervention  Weight Management: Develop a combined nutrition and exercise program designed to reach desired caloric intake, while maintaining appropriate intake of nutrient and fiber, sodium and fats, and appropriate energy expenditure required for the weight goal.    Admit Weight  280 lb 12.8 oz (127.4 kg)    Goal Weight: Short Term  278 lb (126.1 kg)    Goal Weight: Long Term  190 lb (86.2 kg)    Expected Outcomes  Short Term: Continue to assess and modify interventions until short term weight is achieved;Long Term: Adherence to nutrition and physical activity/exercise program aimed toward attainment of established weight goal;Weight Loss: Understanding of general recommendations for a balanced deficit meal plan, which promotes 1-2 lb weight loss per week and includes a negative energy balance of [phone] kcal/d    Diabetes  Yes    Intervention  Provide education about signs/symptoms and action to take for hypo/hyperglycemia.;Provide education about proper nutrition, including hydration, and aerobic/resistive exercise prescription along with prescribed medications  to achieve blood glucose in normal ranges: Fasting glucose 65-99 mg/dL    Expected Outcomes  Short Term: Participant verbalizes understanding of the signs/symptoms and immediate care of hyper/hypoglycemia, proper foot care and importance of medication, aerobic/resistive exercise and nutrition plan for blood glucose control.;Long Term: Attainment of HbA1C < 7%.    Hypertension  Yes    Intervention  Provide education on lifestyle modifcations including regular physical activity/exercise, weight management, moderate sodium restriction and increased consumption of fresh fruit, vegetables, and low fat dairy, alcohol moderation, and smoking cessation.;Monitor prescription use compliance.    Expected Outcomes  Short Term: Continued assessment and intervention until BP is < 140/36m HG in hypertensive participants. < 130/875mHG in hypertensive participants with diabetes, heart failure or chronic kidney disease.;Long Term: Maintenance of blood pressure at goal levels.    Lipids  Yes    Intervention  Provide education and support for participant on nutrition & aerobic/resistive exercise along with prescribed medications to achieve LDL <34m, HDL >433m    Expected Outcomes  Short Term: Participant states understanding of desired cholesterol values and is compliant with medications prescribed. Participant is following exercise prescription and nutrition guidelines.;Long Term: Cholesterol controlled with medications as prescribed, with individualized exercise RX and with personalized nutrition plan. Value goals: LDL < 7022mHDL > 40 mg.    Stress  Yes   Unable to relax- thoughts always going   Intervention  Offer individual and/or small group education and counseling on adjustment to heart disease, stress management and health-related lifestyle change. Teach and support self-help strategies.;Refer participants experiencing significant psychosocial distress to appropriate mental health specialists for further  evaluation and treatment. When possible, include family members and significant others in education/counseling sessions.    Expected Outcomes  Short Term: Participant demonstrates changes in health-related behavior, relaxation and other stress management skills, ability to obtain effective social support, and compliance with psychotropic medications if prescribed.;Long Term: Emotional wellbeing is indicated by absence of clinically significant psychosocial distress or social isolation.       Core Components/Risk Factors/Patient Goals Review:  Goals and Risk Factor Review    Row Name 10/05/17 0919             Core Components/Risk Factors/Patient Goals Review   Personal Goals Review  Weight Management/Obesity;Hypertension;Lipids       Review  ChaKwame doing well in rehab. His weight is up a little bit since starting.  He is down since his event.  He is working on his diet and will add in more cardio at the gym.  ChaKirons not been checking his blood sugars since his machine broke.  He is looking for a new doctor.   His blood pressures have been good and he checks them daily.  He is doing well on his medications now that he is off BriRaymond      Expected Outcomes  Short: Continue to monitor pressures and get a new meter.  Long: Continue to work on weight loss.           Core Components/Risk Factors/Patient Goals at Discharge (Final Review):  Goals and Risk Factor Review - 10/05/17 0919      Core Components/Risk Factors/Patient Goals Review   Personal Goals Review  Weight Management/Obesity;Hypertension;Lipids    Review  ChaMalik doing well in rehab. His weight is up a little bit since starting.  He is down since his event.  He is working on his diet and will add in more cardio at the gym.  ChaDecarloss not been checking his blood sugars since his machine broke.  He is looking for a new doctor.   His blood pressures have been good and he checks them daily.  He is doing well on his  medications now that he is off BriAvon   Expected Outcomes  Short: Continue to monitor pressures and get a new meter.  Long: Continue to work on weight loss.        ITP Comments: ITP Comments    Row Name 09/03/17 1351 09/23/17 0759 10/21/17 0542       ITP Comments  Medical review completed today. ITP sent to Dr M MLoleta Chancer review, changes as needed and signature.  Documentation of diagnosis can be found in [facility] 7/10 Encounter  30 day review completed. ITP sent to Dr. BerRamonita Labovering for Dr. MarEmily Filbert  Medical Director of Cardiac Rehab. Continue with ITP unless changes are made by physician.  New to program  30 day review completed. ITP sent to Dr. Emily Filbert, Medical Director of Cardiac Rehab. Continue with ITP unless changes are made by physician        Comments:

## 2017-10-21 NOTE — Progress Notes (Signed)
Daily Session Note  Patient Details  Name: Daniel Bradley. MRN: [id number] Date of Birth: [birth year] Referring Provider:     Cardiac Rehab from 09/03/2017 in [facility] Cardiac and Pulmonary Rehab  Referring Provider  [facility]      Encounter Date: 10/21/2017  Check In: Session Check In - 10/21/17 0729      Check-In   Supervising physician immediately available to respond to emergencies  See telemetry face sheet for immediately available ER MD    Location  [facility]-Cardiac & Pulmonary Rehab    Staff Present  [name], MA, RCEP, CCRP, Exercise Physiologist;Amalee Olsen [place];Heath Lark, RN, BSN, CCRP    Medication changes reported      No    Fall or balance concerns reported     No    Warm-up and Cool-down  Performed on first and last piece of equipment    Resistance Training Performed  Yes    VAD Patient?  No      Pain Assessment   Currently in Pain?  No/denies          Social History   Tobacco Use  Smoking Status Never Smoker  Smokeless Tobacco Never Used    Goals Met:  Independence with exercise equipment Exercise tolerated well No report of cardiac concerns or symptoms Strength training completed today  Goals Unmet:  Not Applicable  Comments: Pt able to follow exercise prescription today without complaint.  Will continue to monitor for progression.   Dr. Emily Filbert is Medical Director for [place] and [facility].

## 2017-10-23 ENCOUNTER — Encounter: Payer: BLUE CROSS/BLUE SHIELD | Admitting: *Deleted

## 2017-10-23 DIAGNOSIS — Z955 Presence of coronary angioplasty implant and graft: Secondary | ICD-10-CM | POA: Diagnosis not present

## 2017-10-23 NOTE — Progress Notes (Signed)
Daily Session Note  Patient Details  Name: Daniel Bradley. MRN: [id number] Date of Birth: 30-Mar-1955 Referring Provider:     Cardiac Rehab from 09/03/2017 in [facility] Cardiac and Pulmonary Rehab  Referring Provider  [facility]      Encounter Date: 10/23/2017  Check In: Session Check In - 10/23/17 0759      Check-In   Supervising physician immediately available to respond to emergencies  See telemetry face sheet for immediately available ER MD    Location  [facility]-Cardiac & Pulmonary Rehab    Staff Present  Renita Papa, RN BSN;Monterius Rolf Luan [place], MA, RCEP, CCRP, Exercise Physiologist;Amanda Oletta Darter, [place], ACSM CEP, Exercise Physiologist    Medication changes reported      No    Fall or balance concerns reported     No    Warm-up and Cool-down  Performed on first and last piece of equipment    Resistance Training Performed  Yes    VAD Patient?  No    PAD/SET Patient?  No      Pain Assessment   Currently in Pain?  No/denies          Social History   Tobacco Use  Smoking Status Never Smoker  Smokeless Tobacco Never Used    Goals Met:  Independence with exercise equipment Exercise tolerated well No report of cardiac concerns or symptoms Strength training completed today  Goals Unmet:  Not Applicable  Comments: Pt able to follow exercise prescription today without complaint.  Will continue to monitor for progression.    Dr. Emily Filbert is Medical Director for [place] and [facility].

## 2017-10-26 ENCOUNTER — Encounter: Payer: BLUE CROSS/BLUE SHIELD | Admitting: *Deleted

## 2017-10-26 DIAGNOSIS — Z955 Presence of coronary angioplasty implant and graft: Secondary | ICD-10-CM | POA: Diagnosis not present

## 2017-10-26 NOTE — Progress Notes (Signed)
Daily Session Note  Patient Details  Name: Daniel Bradley. MRN: [id number] Date of Birth: 20-Feb-1955 Referring Provider:     Cardiac Rehab from 09/03/2017 in [facility] Cardiac and Pulmonary Rehab  Referring Provider  [facility]      Encounter Date: 10/26/2017  Check In: Session Check In - 10/26/17 0742      Check-In   Supervising physician immediately available to respond to emergencies  See telemetry face sheet for immediately available ER MD    Location  [facility]-Cardiac & Pulmonary Rehab    Staff Present  [name], MA, RCEP, CCRP, Exercise Physiologist;Susanne Bice, RN, BSN, [employer], BS, ACSM CEP, Exercise Physiologist    Medication changes reported      No    Fall or balance concerns reported     No    Warm-up and Cool-down  Performed on first and last piece of equipment    Resistance Training Performed  Yes    VAD Patient?  No    PAD/SET Patient?  No      Pain Assessment   Currently in Pain?  No/denies          Social History   Tobacco Use  Smoking Status Never Smoker  Smokeless Tobacco Never Used    Goals Met:  Independence with exercise equipment Exercise tolerated well No report of cardiac concerns or symptoms Strength training completed today  Goals Unmet:  Not Applicable  Comments: Pt able to follow exercise prescription today without complaint.  Will continue to monitor for progression.    Dr. Emily Filbert is Medical Director for [place] [facility].

## 2017-10-28 DIAGNOSIS — Z955 Presence of coronary angioplasty implant and graft: Secondary | ICD-10-CM | POA: Diagnosis not present

## 2017-10-28 NOTE — Progress Notes (Signed)
Daily Session Note  Patient Details  Name: Daniel Bradley. MRN: [id number] Date of Birth: Jan 25, 1956 Referring Provider:     Cardiac Rehab from 09/03/2017 in [facility] Cardiac and Pulmonary Rehab  Referring Provider  [facility]      Encounter Date: 10/28/2017  Check In: Session Check In - 10/28/17 0800      Check-In   Supervising physician immediately available to respond to emergencies  See telemetry face sheet for immediately available ER MD    Location  [facility]-Cardiac & Pulmonary Rehab    Staff Present  Gerlene Burdock, RN, BSN;Thimothy Barretta Darrin Nipper, [place], [place], [place], Exercise Physiologist    Medication changes reported      No    Fall or balance concerns reported     No    Warm-up and Cool-down  Performed on first and last piece of equipment    Resistance Training Performed  Yes    VAD Patient?  No      Pain Assessment   Currently in Pain?  No/denies          Social History   Tobacco Use  Smoking Status Never Smoker  Smokeless Tobacco Never Used    Goals Met:  Independence with exercise equipment Exercise tolerated well No report of cardiac concerns or symptoms Strength training completed today  Goals Unmet:  Not Applicable  Comments: Pt able to follow exercise prescription today without complaint.  Will continue to monitor for progression.   Dr. Emily Filbert is Medical Director for [place] and [facility].

## 2017-10-30 ENCOUNTER — Encounter: Payer: BLUE CROSS/BLUE SHIELD | Admitting: *Deleted

## 2017-10-30 VITALS — Ht 71.5 in | Wt 270.0 lb

## 2017-10-30 DIAGNOSIS — Z955 Presence of coronary angioplasty implant and graft: Secondary | ICD-10-CM | POA: Diagnosis not present

## 2017-10-30 NOTE — Progress Notes (Signed)
Daily Session Note  Patient Details  Name: Daniel Bradley. MRN: [id number] Date of Birth: 1955-11-24 Referring Provider:     Cardiac Rehab from 09/03/2017 in [facility] Cardiac and Pulmonary Rehab  Referring Provider  [facility]      Encounter Date: 10/30/2017  Check In: Session Check In - 10/30/17 0743      Check-In   Supervising physician immediately available to respond to emergencies  See telemetry face sheet for immediately available ER MD    Location  [facility]-Cardiac & Pulmonary Rehab    Staff Present  Renita Papa, RN BSN;Jamauri Kruzel Luan Pulling, MA, RCEP, CCRP, Exercise Physiologist;Amanda Oletta Darter, [place], ACSM CEP, Exercise Physiologist    Medication changes reported      No    Fall or balance concerns reported     No    Warm-up and Cool-down  Performed on first and last piece of equipment    Resistance Training Performed  Yes    VAD Patient?  No    PAD/SET Patient?  No      Pain Assessment   Currently in Pain?  No/denies          Social History   Tobacco Use  Smoking Status Never Smoker  Smokeless Tobacco Never Used    Goals Met:  Independence with exercise equipment Exercise tolerated well No report of cardiac concerns or symptoms Strength training completed today  Goals Unmet:  Not Applicable  Comments: Pt able to follow exercise prescription today without complaint.  Will continue to monitor for progression. [name] Name 09/03/17 1530 10/30/17 0827       6 Minute Walk   Phase  -  Discharge    Distance  1350 feet  1520 feet    Distance % Change  -  12.6 %    Distance Feet Change  -  170 ft    Walk Time  6 minutes  6 minutes    # of Rest Breaks  0  0    MPH  2.56  2.88    METS  2.95  3.4    RPE  11  15    Perceived Dyspnea   0  0    VO2 Peak  10.33  11.88    Symptoms  No  No    Resting HR  85 bpm  75 bpm    Resting BP  112/60  124/60    Exercise Oxygen Saturation  during 6 min walk  98 %  -    Max Ex. HR  117 bpm  115 bpm    Max Ex. BP   128/68  136/66    2 Minute Post BP  116/64  -          Dr. Emily Filbert is Medical Director for [facility] and [facility].

## 2017-11-02 ENCOUNTER — Encounter: Payer: Self-pay | Admitting: Dietician

## 2017-11-02 ENCOUNTER — Encounter: Payer: BLUE CROSS/BLUE SHIELD | Admitting: *Deleted

## 2017-11-02 DIAGNOSIS — Z955 Presence of coronary angioplasty implant and graft: Secondary | ICD-10-CM

## 2017-11-02 NOTE — Progress Notes (Signed)
Daily Session Note  Patient Details  Name: Daniel Bradley. MRN: [id number] Date of Birth: Jul 27, 1955 Referring Provider:     Cardiac Rehab from 09/03/2017 in [facility] Cardiac and Pulmonary Rehab  Referring Provider  [facility]      Encounter Date: 11/02/2017  Check In: Session Check In - 11/02/17 0755      Check-In   Supervising physician immediately available to respond to emergencies  See telemetry face sheet for immediately available ER MD    Location  [facility]-Cardiac & Pulmonary Rehab    Staff Present  [name], MA, RCEP, CCRP, Exercise Physiologist;Susanne Bice, RN, BSN, [employer], BS, ACSM CEP, Exercise Physiologist    Medication changes reported      No    Fall or balance concerns reported     No    Warm-up and Cool-down  Performed on first and last piece of equipment    Resistance Training Performed  Yes    VAD Patient?  No    PAD/SET Patient?  No      Pain Assessment   Currently in Pain?  No/denies          Social History   Tobacco Use  Smoking Status Never Smoker  Smokeless Tobacco Never Used    Goals Met:  Independence with exercise equipment Exercise tolerated well Personal goals reviewed No report of cardiac concerns or symptoms Strength training completed today  Goals Unmet:  Not Applicable  Comments: Pt able to follow exercise prescription today without complaint.  Will continue to monitor for progression.    Dr. Emily Filbert is Medical Director for [facility] and [facility].

## 2017-11-04 DIAGNOSIS — Z955 Presence of coronary angioplasty implant and graft: Secondary | ICD-10-CM

## 2017-11-04 NOTE — Progress Notes (Signed)
Daily Session Note  Patient Details  Name: Daniel Bradley. MRN: [id number] Date of Birth: 1955/12/06 Referring Provider:     Cardiac Rehab from 09/03/2017 in [facility] Cardiac and Pulmonary Rehab  Referring Provider  [facility]      Encounter Date: 11/04/2017  Check In: Session Check In - 11/04/17 0731      Check-In   Supervising physician immediately available to respond to emergencies  See telemetry face sheet for immediately available ER MD    Location  [facility]-Cardiac & Pulmonary Rehab    Staff Present  [name], MA, RCEP, CCRP, Exercise Physiologist;Jeanette Rauth [place];Heath Lark, RN, BSN, CCRP    Medication changes reported      No    Fall or balance concerns reported     No    Warm-up and Cool-down  Performed on first and last piece of equipment    Resistance Training Performed  Yes    VAD Patient?  No      Pain Assessment   Currently in Pain?  No/denies          Social History   Tobacco Use  Smoking Status Never Smoker  Smokeless Tobacco Never Used    Goals Met:  Improved SOB with ADL's Exercise tolerated well No report of cardiac concerns or symptoms Strength training completed today  Goals Unmet:  Not Applicable  Comments: Pt able to follow exercise prescription today without complaint.  Will continue to monitor for progression.    Dr. Emily Filbert is Medical Director for [place] and [facility].

## 2017-11-04 NOTE — Patient Instructions (Signed)
\Discharge Patient Instructions  Patient Details  Name: Daniel Bradley. MRN: [id number] Date of Birth: 1955-10-07 Referring Provider:  Yolonda Kida, MD   Number of Visits: 40  Reason for Discharge:  Patient reached a stable level of exercise. Patient independent in their exercise. Patient has met program and personal goals.  Smoking History:  Social History   Tobacco Use  Smoking Status Never Smoker  Smokeless Tobacco Never Used    Diagnosis:  Status post coronary artery stent placement  Initial Exercise Prescription: Initial Exercise Prescription - 09/03/17 1500      Date of Initial Exercise RX and Referring Provider   Date  09/03/17    Referring Provider  [place]      Treadmill   MPH  2.4    Grade  0    Minutes  15    METs  2.84      Recumbant Bike   Level  3    RPM  60    Watts  25    Minutes  15    METs  2.8      T5 Nustep   Level  2    SPM  80    Minutes  15    METs  2.8      Prescription Details   Frequency (times per week)  3    Duration  Progress to 45 minutes of aerobic exercise without signs/symptoms of physical distress      Intensity   THRR 40-80% of Max Heartrate  114-144    Ratings of Perceived Exertion  11-15    Perceived Dyspnea  0-4      Resistance Training   Training Prescription  Yes    Weight  3 lb    Reps  10-15       Discharge Exercise Prescription (Final Exercise Prescription Changes): Exercise Prescription Changes - 10/26/17 1600      Response to Exercise   Blood Pressure (Admit)  116/56    Blood Pressure (Exercise)  104/60    Blood Pressure (Exit)  124/60    Heart Rate (Admit)  79 bpm    Heart Rate (Exercise)  94 bpm    Heart Rate (Exit)  68 bpm    Rating of Perceived Exertion (Exercise)  12    Symptoms  none    Duration  Continue with 45 min of aerobic exercise without signs/symptoms of physical distress.    Intensity  THRR unchanged      Progression   Progression  Continue to progress workloads to  maintain intensity without signs/symptoms of physical distress.    Average METs  2.89      Resistance Training   Training Prescription  Yes    Weight  8 lbs    Reps  10-15      Interval Training   Interval Training  No      Treadmill   MPH  2.4    Grade  1    Minutes  15    METs  3.17      Recumbant Bike   Level  6    Watts  30    Minutes  15    METs  2.76      T5 Nustep   Level  6    Minutes  15    METs  2.2      Home Exercise Plan   Plans to continue exercise at  [employer] (comment)   [employer]   Frequency  Add 3 additional days to program exercise sessions.    Initial Home Exercises Provided  09/14/17       Functional Capacity: 6 Minute Walk    Row Name 09/03/17 1530 10/30/17 0827       6 Minute Walk   Phase  -  Discharge    Distance  1350 feet  1520 feet    Distance % Change  -  12.6 %    Distance Feet Change  -  170 ft    Walk Time  6 minutes  6 minutes    # of Rest Breaks  0  0    MPH  2.56  2.88    METS  2.95  3.4    RPE  11  15    Perceived Dyspnea   0  0    VO2 Peak  10.33  11.88    Symptoms  No  No    Resting HR  85 bpm  75 bpm    Resting BP  112/60  124/60    Exercise Oxygen Saturation  during 6 min walk  98 %  -    Max Ex. HR  117 bpm  115 bpm    Max Ex. BP  128/68  136/66    2 Minute Post BP  116/64  -       Quality of Life: Quality of Life - 11/02/17 0756      Quality of Life   Select  Quality of Life      Quality of Life Scores   Health/Function Pre  24 %    Health/Function Post  26.93 %    Health/Function % Change  12.21 %    Socioeconomic Pre  30 %    Socioeconomic Post  30 %    Socioeconomic % Change   0 %    Psych/Spiritual Pre  27.43 %    Psych/Spiritual Post  30 %    Psych/Spiritual % Change  9.37 %    Family Pre  26.4 %    Family Post  27.6 %    Family % Change  4.55 %    GLOBAL Pre  26.25 %    GLOBAL Post  28.34 %    GLOBAL % Change  7.96 %       Personal Goals: Goals established at  orientation with interventions provided to work toward goal. Personal Goals and Risk Factors at Admission - 09/03/17 1329      Core Components/Risk Factors/Patient Goals on Admission    Weight Management  Yes;Obesity;Weight Loss    Intervention  Weight Management: Develop a combined nutrition and exercise program designed to reach desired caloric intake, while maintaining appropriate intake of nutrient and fiber, sodium and fats, and appropriate energy expenditure required for the weight goal.    Admit Weight  280 lb 12.8 oz (127.4 kg)    Goal Weight: Short Term  278 lb (126.1 kg)    Goal Weight: Long Term  190 lb (86.2 kg)    Expected Outcomes  Short Term: Continue to assess and modify interventions until short term weight is achieved;Long Term: Adherence to nutrition and physical activity/exercise program aimed toward attainment of established weight goal;Weight Loss: Understanding of general recommendations for a balanced deficit meal plan, which promotes 1-2 lb weight loss per week and includes a negative energy balance of [id number] kcal/d    Diabetes  Yes    Intervention  Provide education about signs/symptoms and action to take for hypo/hyperglycemia.;Provide  education about proper nutrition, including hydration, and aerobic/resistive exercise prescription along with prescribed medications to achieve blood glucose in normal ranges: Fasting glucose 65-99 mg/dL    Expected Outcomes  Short Term: Participant verbalizes understanding of the signs/symptoms and immediate care of hyper/hypoglycemia, proper foot care and importance of medication, aerobic/resistive exercise and nutrition plan for blood glucose control.;Long Term: Attainment of HbA1C < 7%.    Hypertension  Yes    Intervention  Provide education on lifestyle modifcations including regular physical activity/exercise, weight management, moderate sodium restriction and increased consumption of fresh fruit, vegetables, and low fat dairy, alcohol  moderation, and smoking cessation.;Monitor prescription use compliance.    Expected Outcomes  Short Term: Continued assessment and intervention until BP is < 140/28m HG in hypertensive participants. < 130/822mHG in hypertensive participants with diabetes, heart failure or chronic kidney disease.;Long Term: Maintenance of blood pressure at goal levels.    Lipids  Yes    Intervention  Provide education and support for participant on nutrition & aerobic/resistive exercise along with prescribed medications to achieve LDL <7020mHDL >36m44m  Expected Outcomes  Short Term: Participant states understanding of desired cholesterol values and is compliant with medications prescribed. Participant is following exercise prescription and nutrition guidelines.;Long Term: Cholesterol controlled with medications as prescribed, with individualized exercise RX and with personalized nutrition plan. Value goals: LDL < 70mg11mL > 40 mg.    Stress  Yes   Unable to relax- thoughts always going   Intervention  Offer individual and/or small group education and counseling on adjustment to heart disease, stress management and health-related lifestyle change. Teach and support self-help strategies.;Refer participants experiencing significant psychosocial distress to appropriate mental health specialists for further evaluation and treatment. When possible, include family members and significant others in education/counseling sessions.    Expected Outcomes  Short Term: Participant demonstrates changes in health-related behavior, relaxation and other stress management skills, ability to obtain effective social support, and compliance with psychotropic medications if prescribed.;Long Term: Emotional wellbeing is indicated by absence of clinically significant psychosocial distress or social isolation.        Personal Goals Discharge: Goals and Risk Factor Review - 11/02/17 0846      Core Components/Risk Factors/Patient Goals  Review   Personal Goals Review  Weight Management/Obesity;Hypertension;Lipids    Review  [place].  His weight usually tends to go up on the weekends.  He has gotten down to 278 lbs. He has lost inches as well!! He is doing well with his blood pressures and continues to check them routinely.  He still has not gotten a new meter.  He has a follow up next month and will get a script then.  Overall, everything is going well.     Expected Outcomes  Short: Graduate!!  Long: Continue to monitor risk factors.       Exercise Goals and Review: Exercise Goals    Row Name 09/03/17 1529             Exercise Goals   Increase Physical Activity  Yes       Intervention  Provide advice, education, support and counseling about physical activity/exercise needs.;Develop an individualized exercise prescription for aerobic and resistive training based on initial evaluation findings, risk stratification, comorbidities and participant's personal goals.       Expected Outcomes  Short Term: Attend rehab on a regular basis to increase amount of physical activity.;Long Term: Add in home exercise to make exercise part of routine and  to increase amount of physical activity.;Long Term: Exercising regularly at least 3-5 days a week.       Increase Strength and Stamina  Yes       Intervention  Provide advice, education, support and counseling about physical activity/exercise needs.;Develop an individualized exercise prescription for aerobic and resistive training based on initial evaluation findings, risk stratification, comorbidities and participant's personal goals.       Expected Outcomes  Short Term: Increase workloads from initial exercise prescription for resistance, speed, and METs.;Short Term: Perform resistance training exercises routinely during rehab and add in resistance training at home;Long Term: Improve cardiorespiratory fitness, muscular endurance and strength as measured by increased METs  and functional capacity (6MWT)       Able to understand and use rate of perceived exertion (RPE) scale  Yes       Intervention  Provide education and explanation on how to use RPE scale       Expected Outcomes  Short Term: Able to use RPE daily in rehab to express subjective intensity level;Long Term:  Able to use RPE to guide intensity level when exercising independently       Able to understand and use Dyspnea scale  Yes       Intervention  Provide education and explanation on how to use Dyspnea scale       Expected Outcomes  Short Term: Able to use Dyspnea scale daily in rehab to express subjective sense of shortness of breath during exertion;Long Term: Able to use Dyspnea scale to guide intensity level when exercising independently       Knowledge and understanding of Target Heart Rate Range (THRR)  Yes       Intervention  Provide education and explanation of THRR including how the numbers were predicted and where they are located for reference       Expected Outcomes  Short Term: Able to state/look up THRR;Short Term: Able to use daily as guideline for intensity in rehab;Long Term: Able to use THRR to govern intensity when exercising independently       Able to check pulse independently  Yes       Intervention  Provide education and demonstration on how to check pulse in carotid and radial arteries.;Review the importance of being able to check your own pulse for safety during independent exercise       Expected Outcomes  Short Term: Able to explain why pulse checking is important during independent exercise;Long Term: Able to check pulse independently and accurately       Understanding of Exercise Prescription  Yes       Intervention  Provide education, explanation, and written materials on patient's individual exercise prescription       Expected Outcomes  Short Term: Able to explain program exercise prescription;Long Term: Able to explain home exercise prescription to exercise independently           Nutrition & Weight - Outcomes: Pre Biometrics - 09/03/17 1529      Pre Biometrics   Height  5' 11.5" (1.816 m)    Weight  280 lb 12.8 oz (127.4 kg)    Waist Circumference  50.5 inches    Hip Circumference  47.75 inches    Waist to Hip Ratio  1.06 %    BMI (Calculated)  38.62    Single Leg Stand  7.68 seconds      Post Biometrics - 10/30/17 0826       Post  Biometrics   Height  5' 11.5" (1.816 m)    Weight  270 lb (122.5 kg)    Waist Circumference  46.5 inches    Hip Circumference  45.5 inches    Waist to Hip Ratio  1.02 %    BMI (Calculated)  37.14    Single Leg Stand  5.7 seconds       Nutrition: Nutrition Therapy & Goals - 09/21/17 0909      Nutrition Therapy   Diet  DM    Drug/Food Interactions  Statins/Certain Fruits    Protein (specify units)  13oz    Fiber  35 grams    Whole Grain Foods  3 servings   chooses whole grains   Saturated Fats  16 max. grams    Fruits and Vegetables  6 servings/day   8 ideal   Sodium  1500 grams      Personal Nutrition Goals   Nutrition Goal  Eat breakfast on a consistent basis, even when d/c from our program. Ideally, include a protein + a carbohydrate source at minimum    Personal Goal #2  Include addtional servings of fruit daily, in addition to the serving you may have at breakfast    Personal Goal #3  Look for low-sugar or zero sugar sweets when cravings hit, but always keep portion control in mind    Comments  He does not always eat breakfast, but his lunch and dinner meals are balanced per diet recall. He "craves" sweets and is working to choose lower sugar options and control portions of these foods. Bakes most meats and chooses canned items infrequently      Intervention Plan   Intervention  Prescribe, educate and counsel regarding individualized specific dietary modifications aiming towards targeted core components such as weight, hypertension, lipid management, diabetes, heart failure and other comorbidities.     Expected Outcomes  Short Term Goal: A plan has been developed with personal nutrition goals set during dietitian appointment.;Short Term Goal: Understand basic principles of dietary content, such as calories, fat, sodium, cholesterol and nutrients.;Long Term Goal: Adherence to prescribed nutrition plan.       Nutrition Discharge: Nutrition Assessments - 11/02/17 0756      MEDFICTS Scores   Pre Score  25    Post Score  24    Score Difference  -1       Education Questionnaire Score: Knowledge Questionnaire Score - 11/02/17 0841      Knowledge Questionnaire Score   Pre Score  22/26    Post Score  24/26   reviewed with pt today      Goals reviewed with patient; copy given to patient.

## 2017-11-06 DIAGNOSIS — Z955 Presence of coronary angioplasty implant and graft: Secondary | ICD-10-CM | POA: Diagnosis not present

## 2017-11-06 NOTE — Progress Notes (Signed)
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 with participant of any Quality of Life or Depression concerns found by scoring the questionnaire.;Long Term goal: The participant improves quality of Life and PHQ9 Scores as seen by post scores and/or verbalization of changes       Quality of Life Scores:  Quality of Life - 11/02/17 0756      Quality of Life   Select  Quality of Life      Quality of Life Scores   Health/Function Pre  24 %    Health/Function Post  26.93 %    Health/Function % Change  12.21 %    Socioeconomic Pre  30 %    Socioeconomic Post  30 %    Socioeconomic % Change   0 %    Psych/Spiritual Pre  27.43 %    Psych/Spiritual Post  30 %    Psych/Spiritual % Change  9.37 %    Family Pre  26.4 %    Family Post  27.6 %    Family % Change  4.55 %    GLOBAL Pre  26.25 %    GLOBAL Post  28.34 %     GLOBAL % Change  7.96 %      Scores of 19 and below usually indicate a poorer quality of life in these areas.  A difference of  2-3 points is a clinically meaningful difference.  A difference of 2-3 points in the total score of the Quality of Life Index has been associated with significant improvement in overall quality of life, self-image, physical symptoms, and general health in studies assessing change in quality of life.  PHQ-9: Recent Review Flowsheet Data    Depression screen [facility] 2/9 11/02/2017 09/03/2017 05/12/2016   Decreased Interest 0 0 1   Down, Depressed, Hopeless 0 2 1   PHQ - 2 Score 0 2 2   Altered sleeping '2 1 1   ' Tired, decreased energy '1 1 1   ' Change in  appetite 0 1 0   Feeling bad or failure about yourself  0 1 1   Trouble concentrating 0 0 1    Moving slowly or fidgety/restless 0 0 0   Suicidal thoughts 0 0 0   PHQ-9 Score '3 6 6   ' Difficult doing work/chores Not difficult at all Not difficult at all Not difficult at all     Interpretation of Total Score  Total Score Depression Severity:  1-4 = Minimal depression, 5-9 = Mild depression, 10-14 = Moderate depression, 15-19 = Moderately severe depression, 20-27 = Severe depression   Psychosocial Evaluation and Intervention: Psychosocial Evaluation - 09/09/17 1014      Psychosocial Evaluation & Interventions   Interventions  Encouraged to exercise with the program and follow exercise prescription;Stress management education    Comments  Counselor met with Mr. Shanahan [facility]) today for initial psychosocial evaluation.  He is a 62 year old who had a heart attack and (2) stents several weeks ago.  Blayke has a strong support system with a significant other and (2) adult sons who live locally.  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  Juanda Crumble reports sleeping only 3-4 hours typically per night and was issued a  CPAP in 2009 - that he quit using as it was keeping him awake with the mask bothering him.  He agreed to speak wot his PCP about ordering another Sleep study and possible change of mask that would be less intrusive.  Denham states he may have some depression and anxiety as he realizes since the heart attack that he is more anxious; more tired; can't concentrate; and has more sleep issues.  Alfonse's primary stressors are his health; recent retirement; and he has a great deal of unresolved grief with the loss of all (3) of his siblings over the past 4 years.  Counselor provided information on support groups for helping him with this and he agreed to consider attending and contacting the facilitators for more information.  Damyan has goals to get stronger; live a better quality of life; decrease his depressive symptoms and improve his sleep while in this program.  Counselor will follow with Ajahni throughout the course of this program.      Expected Outcomes  Short:  Aldric will contact the grief support group facilitators to find out more information about attending in the near future.  He will also speak with his PCP soon about his sleep and his mood symptoms and request another sleep study.  Jamesmichael will exercise to improve his overall physiological and psychological health.   Long:  Kage will develop a routine of exercise and diet and stress management techniques to live a better quality of life.  He will also practice coping strategies to accept the multiple losses in his life in a  more positive way.      Continue Psychosocial Services   Follow up required by counselor       Psychosocial Re-Evaluation: Psychosocial Re-Evaluation    Lyles Name 09/21/17 1002 09/23/17 0922 10/05/17 0911 10/26/17 1006       Psychosocial Re-Evaluation   Current issues with  Current Sleep Concerns  Current Sleep Concerns  Current Stress Concerns;Current Sleep Concerns  Current Stress Concerns    Comments  Counselor  follow up this morning with Leotis reporting he has realized his sleep may be related to one of his  medications that he is scheduled to discontinue.  It causes his heart to "race" like he is "on speed" which keeps him from sleeping well.  He reports this class has been positive for him with increased energy and stregnth.  He also reports his mood has improved as well and he is learning a great deal.  Counselor commended Charilie for his progress made and commitment to an improved quality of life.    Counselor follow up with Eduard Clos today on the grief support group information and he reports not being able to look into this due to family visiting for the past 2 weeks.  He also is excited that as of today he is no longer on the medication that causes his heart to race and is likely contributing to his sleep issues.  Counselor will continue to follow with him.   Ural got a shot for his back and pinched nerve.  He is feeling better and able to move beter. He is starting to feel better and has come off Birlintta and changed to plavix.  He is sleeping better and no longer feels like he needs the support group.  He is sleeping better since switching his medicaiton. He isfeeling better overall with the exercise and now has the ability to move again.   Counselor follow up with Eduard Clos today reporting his sleep and mood have improved significantly - subsequent to changing his meds as directed.  As a result, he no longer is interested in a grief share group, but will contact if needed at a later time.  Buford has some stress with his son's health issues and poor choices in life, but he realizes how little of this he can control, and is learning to accept better.  Yousaf and his significant other are in the process of taking classes to become foster/adoptive parents.  He is feeling strong enough for this to happen and believes they have much to offer to children in the system.  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  He has enjoyed this program and will continue exercising regularly at [employer] upon completion here.     Expected Outcomes  Short:  Seville will discontinue his medication as directed and hopefully improve his quality of sleep with the new medication.  If not, he will speak to his Dr. about this.  Long:  Diesel will continue to exercise to achieve his stated goals.    Short:  Vernel will research the grief support groups as needed.  He will also follow Dr's orders on D/C of medications and introduction of new ones.  Long:  Edwen will continue to make positive quality of life choices for his health and mental health.   Short: Continue to get moving more.  Long: Continue to stay postive and practice self care.   Short:  Tudor will continue to exercise for positive health and mental health benefits.  He will practice setting healthy boundaries and limits with others for stress management.   Long:  Chord will continue a routine of positive health and mental health strategies.     Interventions  -  -  Encouraged to attend Cardiac Rehabilitation for the exercise  -    Continue Psychosocial Services   Follow up required by staff  Follow up required by staff  Follow up required by staff  -      Initial Review  Source of Stress Concerns  -  -  Chronic Illness  -       Psychosocial Discharge (Final Psychosocial Re-Evaluation): Psychosocial Re-Evaluation - 10/26/17 1006      Psychosocial Re-Evaluation   Current issues with  Current Stress Concerns    Comments  Counselor follow up with Kijuan today reporting his sleep and mood have improved significantly - subsequent to changing his meds as directed.  As a result, he no longer is interested in a grief share group, but will contact if needed at a later time.  Xion has some stress with his son's health issues and  poor choices in life, but he realizes how little of this he can control, and is learning to accept better.  Hubbert and his significant other are in the process of taking classes to become foster/adoptive parents.  He is feeling strong enough for this to happen and believes they have much to offer to children in the system.  Counselor commended Geographical information systems officer for his positive self-care; for being intuitive enough to realize his medications weren't "right" and for being pro-active in getting these changed.  He has enjoyed this program and will continue exercising regularly at [employer] upon completion here.     Expected Outcomes  Short:  Jotham will continue to exercise for positive health and mental health benefits.  He will practice setting healthy boundaries and limits with others for stress management.   Long:  Linzy will continue a routine of positive health and mental health strategies.        Vocational Rehabilitation: Provide vocational rehab assistance to qualifying candidates.   Vocational Rehab Evaluation & Intervention: Vocational Rehab - 09/03/17 1200      Initial Vocational Rehab Evaluation & Intervention   Assessment shows need for Vocational Rehabilitation  No       Education: Education Goals: Education classes will be provided on a variety of topics geared toward better understanding of heart health and risk factor modification. Participant will state understanding/return demonstration of topics presented as noted by education test scores.  Learning Barriers/Preferences: Learning Barriers/Preferences - 09/03/17 1341      Learning Barriers/Preferences   Learning Barriers  None    Learning Preferences  None       Education Topics:  AED/CPR: - Group verbal and written instruction with the use of models to demonstrate the basic use of the AED with the basic ABC's of resuscitation.   Cardiac Rehab from 11/04/2017 in [facility] Cardiac and Pulmonary Rehab  Date  10/26/17    Educator  SB  Instruction Review Code  1- Verbalizes Understanding      General Nutrition Guidelines/Fats and Fiber: -Group instruction provided by verbal, written material, models and posters to present the general guidelines for heart healthy nutrition. Gives an explanation and review of dietary fats and fiber.   Cardiac Rehab from 11/04/2017 in [facility] Cardiac and Pulmonary Rehab  Date  10/19/17  Educator  LB  Instruction Review Code  1- Verbalizes Understanding      Controlling Sodium/Reading Food Labels: -Group verbal and written material supporting the discussion of sodium use in heart healthy nutrition. Review and explanation with models, verbal and written materials for utilization of the food label.   Cardiac Rehab from 11/04/2017 in [facility] Cardiac and Pulmonary Rehab  Date  10/28/17  Educator  LB  Instruction Review Code  1- Verbalizes Understanding      Exercise Physiology & General Exercise Guidelines: - Group verbal and written instruction with models to  review the exercise physiology of the cardiovascular system and associated critical values. Provides general exercise guidelines with specific guidelines to those with heart or lung disease.    Cardiac Rehab from 11/04/2017 in [facility] Cardiac and Pulmonary Rehab  Date  11/04/17  Educator  [facility]  Instruction Review Code  1- Verbalizes Understanding      Aerobic Exercise & Resistance Training: - Gives group verbal and written instruction on the various components of exercise. Focuses on aerobic and resistive training programs and the benefits of this training and how to safely progress through these programs..   Cardiac Rehab from 11/04/2017 in [facility] Cardiac and Pulmonary Rehab  Date  09/14/17  Educator  [facility]  Instruction Review Code  1- Verbalizes Understanding      Flexibility, Balance, Mind/Body Relaxation: Provides group verbal/written instruction on the benefits of flexibility and balance training, including mind/body  exercise modes such as yoga, pilates and tai chi.  Demonstration and skill practice provided.   Cardiac Rehab from 11/04/2017 in [facility] Cardiac and Pulmonary Rehab  Date  09/21/17  Educator  [facility]  Instruction Review Code  1- Verbalizes Understanding      Stress and Anxiety: - Provides group verbal and written instruction about the health risks of elevated stress and causes of high stress.  Discuss the correlation between heart/lung disease and anxiety and treatment options. Review healthy ways to manage with stress and anxiety.   Depression: - Provides group verbal and written instruction on the correlation between heart/lung disease and depressed mood, treatment options, and the stigmas associated with seeking treatment.   Cardiac Rehab from 11/04/2017 in [facility] Cardiac and Pulmonary Rehab  Date  09/16/17  Educator  [facility]  Instruction Review Code  1- Verbalizes Understanding      Anatomy & Physiology of the Heart: - Group verbal and written instruction and models provide basic cardiac anatomy and physiology, with the coronary electrical and arterial systems. Review of Valvular disease and Heart Failure   Cardiac Procedures: - Group verbal and written instruction to review commonly prescribed medications for heart disease. Reviews the medication, class of the drug, and side effects. Includes the steps to properly store meds and maintain the prescription regimen. (beta blockers and nitrates)   Cardiac Rehab from 11/04/2017 in [facility] Cardiac and Pulmonary Rehab  Date  10/21/17  Educator  SB  Instruction Review Code  1- Verbalizes Understanding      Cardiac Medications I: - Group verbal and written instruction to review commonly prescribed medications for heart disease. Reviews the medication, class of the drug, and side effects. Includes the steps to properly store meds and maintain the prescription regimen.   Cardiac Rehab from 11/04/2017 in [facility] Cardiac and Pulmonary Rehab  Date  10/05/17    Educator  SB  Instruction Review Code  1- Verbalizes Understanding      Cardiac Medications II: -Group verbal and written instruction to review commonly prescribed medications for heart disease. Reviews the medication, class of the drug, and side effects. (all other drug classes)   Cardiac Rehab from 11/04/2017 in [facility] Cardiac and Pulmonary Rehab  Date  09/23/17  Educator  [place]  Instruction Review Code  1- Verbalizes Understanding       Go Sex-Intimacy & Heart Disease, Get SMART - Goal Setting: - Group verbal and written instruction through game format to discuss heart disease and the return to sexual intimacy. Provides group verbal and written material to discuss and apply goal setting through the application of the S.M.A.R.T. Method.  Cardiac Rehab from 11/04/2017 in [facility] Cardiac and Pulmonary Rehab  Date  10/21/17  Educator  SB  Instruction Review Code  1- Verbalizes Understanding      Other Matters of the Heart: - Provides group verbal, written materials and models to describe Stable Angina and Peripheral Artery. Includes description of the disease process and treatment options available to the cardiac patient.   Exercise & Equipment Safety: - Individual verbal instruction and demonstration of equipment use and safety with use of the equipment.   Cardiac Rehab from 11/04/2017 in [facility] Cardiac and Pulmonary Rehab  Date  09/03/17  Educator  [facility]  Instruction Review Code  1- Verbalizes Understanding      Infection Prevention: - Provides verbal and written material to individual with discussion of infection control including proper hand washing and proper equipment cleaning during exercise session.   Cardiac Rehab from 11/04/2017 in [facility] Cardiac and Pulmonary Rehab  Date  09/03/17  Educator  [facility]  Instruction Review Code  1- Verbalizes Understanding      Falls Prevention: - Provides verbal and written material to individual with discussion of falls prevention and safety.    Cardiac Rehab from 11/04/2017 in [facility] Cardiac and Pulmonary Rehab  Date  09/03/17  Educator  [facility]  Instruction Review Code  1- Verbalizes Understanding      Diabetes: - Individual verbal and written instruction to review signs/symptoms of diabetes, desired ranges of glucose level fasting, after meals and with exercise. Acknowledge that pre and post exercise glucose checks will be done for 3 sessions at entry of program.   Cardiac Rehab from 11/04/2017 in [facility] Cardiac and Pulmonary Rehab  Date  09/03/17  Educator  SB  Instruction Review Code  1- Verbalizes Understanding      Know Your Numbers and Risk Factors: -Group verbal and written instruction about important numbers in your health.  Discussion of what are risk factors and how they play a role in the disease process.  Review of Cholesterol, Blood Pressure, Diabetes, and BMI and the role they play in your overall health.   Cardiac Rehab from 11/04/2017 in [facility] Cardiac and Pulmonary Rehab  Date  09/23/17  Educator  [place]  Instruction Review Code  1- Verbalizes Understanding      Sleep Hygiene: -Provides group verbal and written instruction about how sleep can affect your health.  Define sleep hygiene, discuss sleep cycles and impact of sleep habits. Review good sleep hygiene tips.    Other: -Provides group and verbal instruction on various topics (see comments)   Knowledge Questionnaire Score: Knowledge Questionnaire Score - 11/02/17 0841      Knowledge Questionnaire Score   Pre Score  22/26    Post Score  24/26   reviewed with pt today      Core Components/Risk Factors/Patient Goals at Admission: Personal Goals and Risk Factors at Admission - 09/03/17 1329      Core Components/Risk Factors/Patient Goals on Admission    Weight Management  Yes;Obesity;Weight Loss    Intervention  Weight Management: Develop a combined nutrition and exercise program designed to reach desired caloric intake, while maintaining appropriate intake  of nutrient and fiber, sodium and fats, and appropriate energy expenditure required for the weight goal.    Admit Weight  280 lb 12.8 oz (127.4 kg)    Goal Weight: Short Term  278 lb (126.1 kg)    Goal Weight: Long Term  190 lb (86.2 kg)    Expected Outcomes  Short Term: Continue to  assess and modify interventions until short term weight is achieved;Long Term: Adherence to nutrition and physical activity/exercise program aimed toward attainment of established weight goal;Weight Loss: Understanding of general recommendations for a balanced deficit meal plan, which promotes 1-2 lb weight loss per week and includes a negative energy balance of [phone] kcal/d    Diabetes  Yes    Intervention  Provide education about signs/symptoms and action to take for hypo/hyperglycemia.;Provide education about proper nutrition, including hydration, and aerobic/resistive exercise prescription along with prescribed medications to achieve blood glucose in normal ranges: Fasting glucose 65-99 mg/dL    Expected Outcomes  Short Term: Participant verbalizes understanding of the signs/symptoms and immediate care of hyper/hypoglycemia, proper foot care and importance of medication, aerobic/resistive exercise and nutrition plan for blood glucose control.;Long Term: Attainment of HbA1C < 7%.    Hypertension  Yes    Intervention  Provide education on lifestyle modifcations including regular physical activity/exercise, weight management, moderate sodium restriction and increased consumption of fresh fruit, vegetables, and low fat dairy, alcohol moderation, and smoking cessation.;Monitor prescription use compliance.    Expected Outcomes  Short Term: Continued assessment and intervention until BP is < 140/16m HG in hypertensive participants. < 130/890mHG in hypertensive participants with diabetes, heart failure or chronic kidney disease.;Long Term: Maintenance of blood pressure at goal levels.    Lipids  Yes    Intervention  Provide  education and support for participant on nutrition & aerobic/resistive exercise along with prescribed medications to achieve LDL <7030mHDL >36m64m  Expected Outcomes  Short Term: Participant states understanding of desired cholesterol values and is compliant with medications prescribed. Participant is following exercise prescription and nutrition guidelines.;Long Term: Cholesterol controlled with medications as prescribed, with individualized exercise RX and with personalized nutrition plan. Value goals: LDL < 70mg2mL > 40 mg.    Stress  Yes   Unable to relax- thoughts always going   Intervention  Offer individual and/or small group education and counseling on adjustment to heart disease, stress management and health-related lifestyle change. Teach and support self-help strategies.;Refer participants experiencing significant psychosocial distress to appropriate mental health specialists for further evaluation and treatment. When possible, include family members and significant others in education/counseling sessions.    Expected Outcomes  Short Term: Participant demonstrates changes in health-related behavior, relaxation and other stress management skills, ability to obtain effective social support, and compliance with psychotropic medications if prescribed.;Long Term: Emotional wellbeing is indicated by absence of clinically significant psychosocial distress or social isolation.       Core Components/Risk Factors/Patient Goals Review:  Goals and Risk Factor Review    Row Name 10/05/17 0919 11/02/17 0846           Core Components/Risk Factors/Patient Goals Review   Personal Goals Review  Weight Management/Obesity;Hypertension;Lipids  Weight Management/Obesity;Hypertension;Lipids      Review  CharlDetrelloing well in rehab. His weight is up a little bit since starting.  He is down since his event.  He is working on his diet and will add in more cardio at the gym.  CharlAdithnot been checking  his blood sugars since his machine broke.  He is looking for a new doctor.   His blood pressures have been good and he checks them daily.  He is doing well on his medications now that he is off BrillScurryharlWangearing graduation.  His weight usually tends to go up on the weekends.  He has gotten down to 278  lbs. He has lost inches as well!! He is doing well with his blood pressures and continues to check them routinely.  He still has not gotten a new meter.  He has a follow up next month and will get a script then.  Overall, everything is going well.       Expected Outcomes  Short: Continue to monitor pressures and get a new meter.  Long: Continue to work on weight loss.   Short: Graduate!!  Long: Continue to monitor risk factors.         Core Components/Risk Factors/Patient Goals at Discharge (Final Review):  Goals and Risk Factor Review - 11/02/17 0846      Core Components/Risk Factors/Patient Goals Review   Personal Goals Review  Weight Management/Obesity;Hypertension;Lipids    Review  Kaire is nearing graduation.  His weight usually tends to go up on the weekends.  He has gotten down to 278 lbs. He has lost inches as well!! He is doing well with his blood pressures and continues to check them routinely.  He still has not gotten a new meter.  He has a follow up next month and will get a script then.  Overall, everything is going well.     Expected Outcomes  Short: Graduate!!  Long: Continue to monitor risk factors.       ITP Comments: ITP Comments    Row Name 09/03/17 1351 09/23/17 0759 10/21/17 0542 11/06/17 0932     ITP Comments  Medical review completed today. ITP sent to Dr Loleta Chance for review, changes as needed and signature.  Documentation of diagnosis can be found in [facility] 7/10 Encounter  30 day review completed. ITP sent to Dr. Ramonita Lab, covering for Dr. Emily Filbert, Medical Director of Cardiac Rehab. Continue with ITP unless changes are made by physician.  New to program  30  day review completed. ITP sent to Dr. Emily Filbert, Medical Director of Cardiac Rehab. Continue with ITP unless changes are made by physician  Discharge ITP sent and signed by Dr. Sabra Heck.  Discharge Summary routed to PCP and cardiologist.       Comments: Discharge ITP

## 2017-11-06 NOTE — Progress Notes (Signed)
Daily Session Note  Patient Details  Name: Daniel Bradley. MRN: [id number] Date of Birth: 11-01-1955 Referring Provider:     Cardiac Rehab from 09/03/2017 in [facility] Cardiac [facility]  Referring Provider  [facility]      Encounter Date: 11/06/2017  Check In:      Social History   Tobacco Use  Smoking Status Never Smoker  Smokeless Tobacco Never Used    Goals Met:  Independence with exercise equipment Exercise tolerated well No report of cardiac concerns or symptoms Strength training completed today  Goals Unmet:  Not Applicable  Comments:  Millard graduated today from  rehab with 36 sessions completed.  Details of the patient's exercise prescription and what He needs to do in order to continue the prescription and progress were discussed with patient.  Patient was given a copy of prescription and goals.  Patient verbalized understanding.  Jerad plans to continue to exercise by going to [employer].    Dr. Emily Filbert is Medical Director for [facility].

## 2017-11-06 NOTE — Progress Notes (Signed)
Discharge Progress Report  Patient Details  Name: Daniel Bradley. MRN: [id number] Date of Birth: [birth year] Referring Provider:     Cardiac Rehab from 09/03/2017 in [facility] Cardiac [facility] Pulmonary Rehab  Referring Provider  [facility]       Number of Visits: 17  Reason for Discharge:  Patient reached a stable level of exercise. Patient independent in their exercise. Patient has met program and personal goals.  Smoking History:  Social History   Tobacco Use  Smoking Status Never Smoker  Smokeless Tobacco Never Used    Diagnosis:  Status post coronary artery stent placement  ADL UCSD:   Initial Exercise Prescription: Initial Exercise Prescription - 09/03/17 1500      Date of Initial Exercise RX and Referring Provider   Date  09/03/17    Referring Provider  [place]      Treadmill   MPH  2.4    Grade  0    Minutes  15    METs  2.84      Recumbant Bike   Level  3    RPM  60    Watts  25    Minutes  15    METs  2.8      T5 Nustep   Level  2    SPM  80    Minutes  15    METs  2.8      Prescription Details   Frequency (times per week)  3    Duration  Progress to 45 minutes of aerobic exercise without signs/symptoms of physical distress      Intensity   THRR 40-80% of Max Heartrate  114-144    Ratings of Perceived Exertion  11-15    Perceived Dyspnea  0-4      Resistance Training   Training Prescription  Yes    Weight  3 lb    Reps  10-15       Discharge Exercise Prescription (Final Exercise Prescription Changes): Exercise Prescription Changes - 10/26/17 1600      Response to Exercise   Blood Pressure (Admit)  116/56    Blood Pressure (Exercise)  104/60    Blood Pressure (Exit)  124/60    Heart Rate (Admit)  79 bpm    Heart Rate (Exercise)  94 bpm    Heart Rate (Exit)  68 bpm    Rating of Perceived Exertion (Exercise)  12    Symptoms  none    Duration  Continue with 45 min of aerobic exercise without signs/symptoms of physical distress.     Intensity  THRR unchanged      Progression   Progression  Continue to progress workloads to maintain intensity without signs/symptoms of physical distress.    Average METs  2.89      Resistance Training   Training Prescription  Yes    Weight  8 lbs    Reps  10-15      Interval Training   Interval Training  No      Treadmill   MPH  2.4    Grade  1    Minutes  15    METs  3.17      Recumbant Bike   Level  6    Watts  30    Minutes  15    METs  2.76      T5 Nustep   Level  6    Minutes  15    METs  2.2  Home Exercise Plan   Plans to continue exercise at  [facility] (comment)   [employer]   Frequency  Add 3 additional days to program exercise sessions.    Initial Home Exercises Provided  09/14/17       Functional Capacity: 6 Minute Walk    Row Name 09/03/17 1530 10/30/17 0827       6 Minute Walk   Phase  -  Discharge    Distance  1350 feet  1520 feet    Distance % Change  -  12.6 %    Distance Feet Change  -  170 ft    Walk Time  6 minutes  6 minutes    # of Rest Breaks  0  0    MPH  2.56  2.88    METS  2.95  3.4    RPE  11  15    Perceived Dyspnea   0  0    VO2 Peak  10.33  11.88    Symptoms  No  No    Resting HR  85 bpm  75 bpm    Resting BP  112/60  124/60    Exercise Oxygen Saturation  during 6 min walk  98 %  -    Max Ex. HR  117 bpm  115 bpm    Max Ex. BP  128/68  136/66    2 Minute Post BP  116/64  -       Psychological, QOL, Others - Outcomes: PHQ 2/9: Depression screen [facility] 2/9 11/02/2017 09/03/2017 05/12/2016  Decreased Interest 0 0 1  Down, Depressed, Hopeless 0 2 1  PHQ - 2 Score 0 2 2  Altered sleeping 2 1 1   Tired, decreased energy 1 1 1   Change in appetite 0 1 0  Feeling bad or failure about yourself  0 1 1  Trouble concentrating 0 0 1  Moving slowly or fidgety/restless 0 0 0  Suicidal thoughts 0 0 0  PHQ-9 Score 3 6 6   Difficult doing work/chores Not difficult at all Not difficult at all Not difficult at all     Quality of Life: Quality of Life - 11/02/17 0756      Quality of Life   Select  Quality of Life      Quality of Life Scores   Health/Function Pre  24 %    Health/Function Post  26.93 %    Health/Function % Change  12.21 %    Socioeconomic Pre  30 %    Socioeconomic Post  30 %    Socioeconomic % Change   0 %    Psych/Spiritual Pre  27.43 %    Psych/Spiritual Post  30 %    Psych/Spiritual % Change  9.37 %    Family Pre  26.4 %    Family Post  27.6 %    Family % Change  4.55 %    GLOBAL Pre  26.25 %    GLOBAL Post  28.34 %    GLOBAL % Change  7.96 %       Personal Goals: Goals established at orientation with interventions provided to work toward goal. Personal Goals and Risk Factors at Admission - 09/03/17 1329      Core Components/Risk Factors/Patient Goals on Admission    Weight Management  Yes;Obesity;Weight Loss    Intervention  Weight Management: Develop a combined nutrition and exercise program designed to reach desired caloric intake, while maintaining appropriate intake of nutrient and  fiber, sodium and fats, and appropriate energy expenditure required for the weight goal.    Admit Weight  280 lb 12.8 oz (127.4 kg)    Goal Weight: Short Term  278 lb (126.1 kg)    Goal Weight: Long Term  190 lb (86.2 kg)    Expected Outcomes  Short Term: Continue to assess and modify interventions until short term weight is achieved;Long Term: Adherence to nutrition and physical activity/exercise program aimed toward attainment of established weight goal;Weight Loss: Understanding of general recommendations for a balanced deficit meal plan, which promotes 1-2 lb weight loss per week and includes a negative energy balance of [phone] kcal/d    Diabetes  Yes    Intervention  Provide education about signs/symptoms and action to take for hypo/hyperglycemia.;Provide education about proper nutrition, including hydration, and aerobic/resistive exercise prescription along with prescribed  medications to achieve blood glucose in normal ranges: Fasting glucose 65-99 mg/dL    Expected Outcomes  Short Term: Participant verbalizes understanding of the signs/symptoms and immediate care of hyper/hypoglycemia, proper foot care and importance of medication, aerobic/resistive exercise and nutrition plan for blood glucose control.;Long Term: Attainment of HbA1C < 7%.    Hypertension  Yes    Intervention  Provide education on lifestyle modifcations including regular physical activity/exercise, weight management, moderate sodium restriction and increased consumption of fresh fruit, vegetables, and low fat dairy, alcohol moderation, and smoking cessation.;Monitor prescription use compliance.    Expected Outcomes  Short Term: Continued assessment and intervention until BP is < 140/17m HG in hypertensive participants. < 130/824mHG in hypertensive participants with diabetes, heart failure or chronic kidney disease.;Long Term: Maintenance of blood pressure at goal levels.    Lipids  Yes    Intervention  Provide education and support for participant on nutrition & aerobic/resistive exercise along with prescribed medications to achieve LDL <7030mHDL >24m29m  Expected Outcomes  Short Term: Participant states understanding of desired cholesterol values and is compliant with medications prescribed. Participant is following exercise prescription and nutrition guidelines.;Long Term: Cholesterol controlled with medications as prescribed, with individualized exercise RX and with personalized nutrition plan. Value goals: LDL < 70mg68mL > 40 mg.    Stress  Yes   Unable to relax- thoughts always going   Intervention  Offer individual and/or small group education and counseling on adjustment to heart disease, stress management and health-related lifestyle change. Teach and support self-help strategies.;Refer participants experiencing significant psychosocial distress to appropriate mental health specialists for  further evaluation and treatment. When possible, include family members and significant others in education/counseling sessions.    Expected Outcomes  Short Term: Participant demonstrates changes in health-related behavior, relaxation and other stress management skills, ability to obtain effective social support, and compliance with psychotropic medications if prescribed.;Long Term: Emotional wellbeing is indicated by absence of clinically significant psychosocial distress or social isolation.        Personal Goals Discharge: Goals and Risk Factor Review    Row Name 10/05/17 0919 11/02/17 0846           Core Components/Risk Factors/Patient Goals Review   Personal Goals Review  Weight Management/Obesity;Hypertension;Lipids  Weight Management/Obesity;Hypertension;Lipids      Review  CharlGustavooing well in rehab. His weight is up a little bit since starting.  He is down since his event.  He is working on his diet and will add in more cardio at the gym.  CharlMilfrednot been checking his blood sugars since his machine broke.  He  is looking for a new doctor.   His blood pressures have been good and he checks them daily.  He is doing well on his medications now that he is off [place].   Daniel Bradley is nearing graduation.  His weight usually tends to go up on the weekends.  He has gotten down to 278 lbs. He has lost inches as well!! He is doing well with his blood pressures and continues to check them routinely.  He still has not gotten a new meter.  He has a follow up next month and will get a script then.  Overall, everything is going well.       Expected Outcomes  Short: Continue to monitor pressures and get a new meter.  Long: Continue to work on weight loss.   Short: Graduate!!  Long: Continue to monitor risk factors.         Exercise Goals and Review: Exercise Goals    Row Name 09/03/17 1529             Exercise Goals   Increase Physical Activity  Yes       Intervention  Provide advice,  education, support and counseling about physical activity/exercise needs.;Develop an individualized exercise prescription for aerobic and resistive training based on initial evaluation findings, risk stratification, comorbidities and participant's personal goals.       Expected Outcomes  Short Term: Attend rehab on a regular basis to increase amount of physical activity.;Long Term: Add in home exercise to make exercise part of routine and to increase amount of physical activity.;Long Term: Exercising regularly at least 3-5 days a week.       Increase Strength and Stamina  Yes       Intervention  Provide advice, education, support and counseling about physical activity/exercise needs.;Develop an individualized exercise prescription for aerobic and resistive training based on initial evaluation findings, risk stratification, comorbidities and participant's personal goals.       Expected Outcomes  Short Term: Increase workloads from initial exercise prescription for resistance, speed, and METs.;Short Term: Perform resistance training exercises routinely during rehab and add in resistance training at home;Long Term: Improve cardiorespiratory fitness, muscular endurance and strength as measured by increased METs and functional capacity (6MWT)       Able to understand and use rate of perceived exertion (RPE) scale  Yes       Intervention  Provide education and explanation on how to use RPE scale       Expected Outcomes  Short Term: Able to use RPE daily in rehab to express subjective intensity level;Long Term:  Able to use RPE to guide intensity level when exercising independently       Able to understand and use Dyspnea scale  Yes       Intervention  Provide education and explanation on how to use Dyspnea scale       Expected Outcomes  Short Term: Able to use Dyspnea scale daily in rehab to express subjective sense of shortness of breath during exertion;Long Term: Able to use Dyspnea scale to guide intensity  level when exercising independently       Knowledge and understanding of Target Heart Rate Range (THRR)  Yes       Intervention  Provide education and explanation of THRR including how the numbers were predicted and where they are located for reference       Expected Outcomes  Short Term: Able to state/look up THRR;Short Term: Able to use daily as guideline for intensity  in rehab;Long Term: Able to use THRR to govern intensity when exercising independently       Able to check pulse independently  Yes       Intervention  Provide education and demonstration on how to check pulse in carotid and radial arteries.;Review the importance of being able to check your own pulse for safety during independent exercise       Expected Outcomes  Short Term: Able to explain why pulse checking is important during independent exercise;Long Term: Able to check pulse independently and accurately       Understanding of Exercise Prescription  Yes       Intervention  Provide education, explanation, and written materials on patient's individual exercise prescription       Expected Outcomes  Short Term: Able to explain program exercise prescription;Long Term: Able to explain home exercise prescription to exercise independently          Nutrition & Weight - Outcomes: Pre Biometrics - 09/03/17 1529      Pre Biometrics   Height  5' 11.5" (1.816 m)    Weight  280 lb 12.8 oz (127.4 kg)    Waist Circumference  50.5 inches    Hip Circumference  47.75 inches    Waist to Hip Ratio  1.06 %    BMI (Calculated)  38.62    Single Leg Stand  7.68 seconds      Post Biometrics - 10/30/17 0826       Post  Biometrics   Height  5' 11.5" (1.816 m)    Weight  270 lb (122.5 kg)    Waist Circumference  46.5 inches    Hip Circumference  45.5 inches    Waist to Hip Ratio  1.02 %    BMI (Calculated)  37.14    Single Leg Stand  5.7 seconds       Nutrition: Nutrition Therapy & Goals - 09/21/17 0909      Nutrition Therapy   Diet   DM    Drug/Food Interactions  Statins/Certain Fruits    Protein (specify units)  13oz    Fiber  35 grams    Whole Grain Foods  3 servings   chooses whole grains   Saturated Fats  16 max. grams    Fruits and Vegetables  6 servings/day   8 ideal   Sodium  1500 grams      Personal Nutrition Goals   Nutrition Goal  Eat breakfast on a consistent basis, even when d/c from our program. Ideally, include a protein + a carbohydrate source at minimum    Personal Goal #2  Include addtional servings of fruit daily, in addition to the serving you may have at breakfast    Personal Goal #3  Look for low-sugar or zero sugar sweets when cravings hit, but always keep portion control in mind    Comments  He does not always eat breakfast, but his lunch and dinner meals are balanced per diet recall. He "craves" sweets and is working to choose lower sugar options and control portions of these foods. Bakes most meats and chooses canned items infrequently      Intervention Plan   Intervention  Prescribe, educate and counsel regarding individualized specific dietary modifications aiming towards targeted core components such as weight, hypertension, lipid management, diabetes, heart failure and other comorbidities.    Expected Outcomes  Short Term Goal: A plan has been developed with personal nutrition goals set during dietitian appointment.;Short Term Goal: Understand basic principles  of dietary content, such as calories, fat, sodium, cholesterol and nutrients.;Long Term Goal: Adherence to prescribed nutrition plan.       Nutrition Discharge: Nutrition Assessments - 11/02/17 0756      MEDFICTS Scores   Pre Score  25    Post Score  24    Score Difference  -1       Education Questionnaire Score: Knowledge Questionnaire Score - 11/02/17 0841      Knowledge Questionnaire Score   Pre Score  22/26    Post Score  24/26   reviewed with pt today      Goals reviewed with patient; copy given to patient.

## 2018-04-12 ENCOUNTER — Other Ambulatory Visit: Payer: Self-pay

## 2018-04-13 LAB — CMP12+LP+TP+TSH+6AC+PSA+CBC…
ALT: 20 IU/L (ref 0–44)
AST: 16 IU/L (ref 0–40)
Albumin/Globulin Ratio: 1.6 (ref 1.2–2.2)
Albumin: 4.4 g/dL (ref 3.8–4.8)
Alkaline Phosphatase: 100 IU/L (ref 39–117)
BUN/Creatinine Ratio: 19 (ref 10–24)
BUN: 19 mg/dL (ref 8–27)
Basophils Absolute: 0.1 10*3/uL (ref 0.0–0.2)
Basos: 2 %
Bilirubin Total: 0.6 mg/dL (ref 0.0–1.2)
Calcium: 10 mg/dL (ref 8.6–10.2)
Chloride: 105 mmol/L (ref 96–106)
Chol/HDL Ratio: 4 ratio (ref 0.0–5.0)
Cholesterol, Total: 177 mg/dL (ref 100–199)
Creatinine, Ser: 0.99 mg/dL (ref 0.76–1.27)
EOS (ABSOLUTE): 0.3 10*3/uL (ref 0.0–0.4)
Eos: 5 %
Estimated CHD Risk: 0.7 times avg. (ref 0.0–1.0)
Free Thyroxine Index: 1.4 (ref 1.2–4.9)
GFR calc Af Amer: 94 mL/min/{1.73_m2} (ref >59)
GFR calc non Af Amer: 81 mL/min/{1.73_m2} (ref >59)
GGT: 49 IU/L (ref 0–65)
Globulin, Total: 2.7 g/dL (ref 1.5–4.5)
Glucose: 144 mg/dL — ABNORMAL HIGH (ref 65–99)
HDL: 44 mg/dL (ref >39)
Hematocrit: 43.8 % (ref 37.5–51.0)
Hemoglobin: 13.8 g/dL (ref 13.0–17.7)
Immature Grans (Abs): 0 10*3/uL (ref 0.0–0.1)
Immature Granulocytes: 0 %
Iron: 70 ug/dL (ref 38–169)
LDH: 158 IU/L (ref 121–224)
LDL Calculated: 115 mg/dL — ABNORMAL HIGH (ref 0–99)
Lymphocytes Absolute: 1.7 10*3/uL (ref 0.7–3.1)
Lymphs: 33 %
MCH: 25.1 pg — ABNORMAL LOW (ref 26.6–33.0)
MCHC: 31.5 g/dL (ref 31.5–35.7)
MCV: 80 fL (ref 79–97)
Monocytes Absolute: 0.5 10*3/uL (ref 0.1–0.9)
Monocytes: 9 %
Neutrophils Absolute: 2.6 10*3/uL (ref 1.4–7.0)
Neutrophils: 51 %
Phosphorus: 3.5 mg/dL (ref 2.8–4.1)
Platelets: 295 10*3/uL (ref 150–450)
Potassium: 4.6 mmol/L (ref 3.5–5.2)
Prostate Specific Ag, Serum: <0.1 ng/mL (ref 0.0–4.0)
RBC: 5.5 x10E6/uL (ref 4.14–5.80)
RDW: 15.7 % — ABNORMAL HIGH (ref 11.6–15.4)
Sodium: 139 mmol/L (ref 134–144)
T3 Uptake Ratio: 22 % — ABNORMAL LOW (ref 24–39)
T4, Total: 6.3 ug/dL (ref 4.5–12.0)
TSH: 2.32 u[IU]/mL (ref 0.450–4.500)
Total Protein: 7.1 g/dL (ref 6.0–8.5)
Triglycerides: 91 mg/dL (ref 0–149)
Uric Acid: 5.7 mg/dL (ref 3.7–8.6)
VLDL Cholesterol Cal: 18 mg/dL (ref 5–40)
WBC: 5.2 10*3/uL (ref 3.4–10.8)

## 2018-04-14 LAB — SPECIMEN STATUS REPORT

## 2018-04-14 LAB — HGB A1C W/O EAG: Hgb A1c MFr Bld: 7.2 % — ABNORMAL HIGH (ref 4.8–5.6)

## 2018-09-09 ENCOUNTER — Ambulatory Visit: Payer: Managed Care, Other (non HMO) | Admitting: Internal Medicine

## 2018-09-09 ENCOUNTER — Other Ambulatory Visit: Payer: Self-pay

## 2018-09-09 ENCOUNTER — Encounter: Payer: Self-pay | Admitting: Internal Medicine

## 2018-09-09 VITALS — BP 140/80 | HR 67 | Temp 98.3°F | Resp 16 | Ht 71.0 in | Wt 286.0 lb

## 2018-09-09 DIAGNOSIS — E119 Type 2 diabetes mellitus without complications: Secondary | ICD-10-CM | POA: Insufficient documentation

## 2018-09-09 DIAGNOSIS — E1169 Type 2 diabetes mellitus with other specified complication: Secondary | ICD-10-CM | POA: Insufficient documentation

## 2018-09-09 DIAGNOSIS — I25810 Atherosclerosis of coronary artery bypass graft(s) without angina pectoris: Secondary | ICD-10-CM

## 2018-09-09 DIAGNOSIS — E7849 Other hyperlipidemia: Secondary | ICD-10-CM

## 2018-09-09 DIAGNOSIS — Z6839 Body mass index (BMI) 39.0-39.9, adult: Secondary | ICD-10-CM

## 2018-09-09 DIAGNOSIS — I10 Essential (primary) hypertension: Secondary | ICD-10-CM | POA: Insufficient documentation

## 2018-09-09 DIAGNOSIS — H6122 Impacted cerumen, left ear: Secondary | ICD-10-CM | POA: Insufficient documentation

## 2018-09-09 DIAGNOSIS — I214 Non-ST elevation (NSTEMI) myocardial infarction: Secondary | ICD-10-CM

## 2018-09-09 NOTE — Patient Instructions (Addendum)
Please check the medicines at home and confirm these are correct (if not, please call the office) 1. Atorvastatin - 80mg  daily 2. Plavix - 75 mg daily 3. One baby Aspirin daily 4. Metformin - 500 mg twice daily 5. Metoprolol - 25 mg twice daily 6. Vitamin D supplement

## 2018-09-09 NOTE — Progress Notes (Signed)
S - Patient presents with left ear blockage, had irrigated in the past here and helped. Not painful.  Denies PND, sinus congestion, sore throat, or cough no fevers, no sx's of concern for Covid Hearing decreased some from the left ear   Last saw cardiology in April (that note reviewed) and was noted on his lisinorpil/HCTZ med and then in June on our last visit, he noted was not taking and today again noted not taking, told was stopped in past with his BP well controlled without.  Meds reviewed today - He noted he is taking 80mg  of atorvastatin (rec'ed by cards last visit with LDL goal <70)  Current Outpatient Medications on File Prior to Visit  Medication Sig Dispense Refill  . aspirin EC 81 MG tablet Take 81 mg by mouth daily.    Marland Kitchen atorvastatin (LIPITOR) 40 MG tablet Take 1 tablet (40 mg total) by mouth daily at 6 PM. (Patient taking differently: Take 40 mg by mouth once. ) 30 tablet 3  . clopidogrel (PLAVIX) 75 MG tablet TAKE 1 TABLET(75 MG) BY MOUTH EVERY DAY    . ibuprofen (ADVIL) 200 MG tablet Take by mouth.    . metFORMIN (GLUCOPHAGE) 500 MG tablet Take 500 mg by mouth 2 (two) times daily with a meal.    . metoprolol tartrate (LOPRESSOR) 25 MG tablet Take 1 tablet (25 mg total) by mouth 2 (two) times daily. 60 tablet 2  . nitroGLYCERIN (NITROSTAT) 0.4 MG SL tablet Place 1 tablet (0.4 mg total) under the tongue every 5 (five) minutes as needed for chest pain. 30 tablet 1  . Vitamin D, Ergocalciferol, (DRISDOL) 50000 units CAPS capsule Take by mouth.     No current facility-administered medications on file prior to visit.      No Known Allergies    No tob hx  O - NAD, masked  BP 140/80 (BP Location: Right Arm, Patient Position: Sitting, Cuff Size: Large)   Pulse 67   Temp 98.3 F (36.8 C) (Oral)   Resp 16   Ht 5\' 11"  (1.803 m)   Wt 286 lb (129.7 kg)   SpO2 96%   BMI 39.89 kg/m    Weight June visit 282.4, 281 in March visit  HEENT - conj - non-inj'ed,  Right TM clear  with very faint cerumen in canal distal,  Left TM not visualized with cerumen in canal  NT tugging on Right lobe, NT tugging on Left lobe Neck - no subauricular adenopathy, no increased anterior cervical nodes or posterior cervical nodes, no rigidity Affect not flat, approp with conversation  Ass/Plan 1: cerumen impaction right ear  Discussed the irrigation with warm water with device have in office and patient agreed to proceed and this was done without incident A large amount of cerumen was successfully irrigated Exam after noted min erythema in the canal (likely from procedure), TM clear.  Also, noted could hear now much better  2.  H/o MI/CAD - significant disease noted per last note from cardiology. Managing medically and goals noted for problems below   3. Hyperlipidemia - on statin with recent increase in dose (to 80mg  Lipitor) and to continue  Goal is LDL < 70 (last was 115 in March before the increase dose)  4. DM - on metformin with last A1C 6.4 in the office on 07/14/2018 and 7.2 in March.   Goal is A1C < 6.0  Off the lisinopril noted and often helpful in diabetics for kidneys noted Will check a urine for  microalbumin with labs prior to f/u planned in September  5. Obesity - noted weigh today and a little up from last visit and cont to monitor with weight control important as well  He was not sure of the medicines and doses and not have his meds with him today. Wrote on the AVS the meds and doses we have him taking and he will check when home and if different, he is to call the office.  F/u in Sept with A1C, BMP and urine microalb prior to visit scheduled. Sooner prn

## 2018-09-15 DIAGNOSIS — G4733 Obstructive sleep apnea (adult) (pediatric): Secondary | ICD-10-CM | POA: Diagnosis not present

## 2018-09-17 ENCOUNTER — Encounter: Payer: Self-pay | Admitting: Internal Medicine

## 2018-09-25 ENCOUNTER — Other Ambulatory Visit: Payer: Self-pay | Admitting: Radiology

## 2018-09-25 DIAGNOSIS — Z20822 Contact with and (suspected) exposure to covid-19: Secondary | ICD-10-CM

## 2018-09-25 DIAGNOSIS — R6889 Other general symptoms and signs: Secondary | ICD-10-CM | POA: Diagnosis not present

## 2018-09-26 LAB — NOVEL CORONAVIRUS, NAA: SARS-CoV-2, NAA: NOT DETECTED

## 2018-10-04 ENCOUNTER — Other Ambulatory Visit: Payer: Self-pay

## 2018-10-04 MED ORDER — METFORMIN HCL 500 MG PO TABS: 500.0000 mg | ORAL_TABLET | Freq: Two times a day (BID) | ORAL | 3 refills | Status: DC

## 2018-10-04 NOTE — Telephone Encounter (Signed)
Paper chart review conducted as Claude just started Epic June 2020.  Patient HgbA1c has been stable less than 8 past year last exec panel with Hgba1c Mar 2020 stable renal function/glucose/lipids.  Last stat A1c 6.15 Jul 2018.  Last office visit Jul 2020 with Dr Roxan Hockey.  Electronic Rx to his pharmacy of choice for 1 year supply metformin.  Next office visit due Sep 2020 per Dr Roxan Hockey OV note.

## 2018-10-21 ENCOUNTER — Other Ambulatory Visit: Payer: Managed Care, Other (non HMO)

## 2018-10-26 ENCOUNTER — Other Ambulatory Visit: Payer: Self-pay

## 2018-10-28 ENCOUNTER — Ambulatory Visit: Payer: Managed Care, Other (non HMO) | Admitting: Internal Medicine

## 2018-11-02 ENCOUNTER — Ambulatory Visit: Payer: Self-pay | Admitting: Internal Medicine

## 2018-11-10 ENCOUNTER — Other Ambulatory Visit: Payer: Self-pay

## 2018-11-10 ENCOUNTER — Other Ambulatory Visit: Payer: Managed Care, Other (non HMO)

## 2018-11-10 DIAGNOSIS — E1169 Type 2 diabetes mellitus with other specified complication: Secondary | ICD-10-CM

## 2018-11-11 LAB — BASIC METABOLIC PANEL
BUN/Creatinine Ratio: 16 (ref 10–24)
BUN: 14 mg/dL (ref 8–27)
CO2: 24 mmol/L (ref 20–29)
Calcium: 9.7 mg/dL (ref 8.6–10.2)
Chloride: 103 mmol/L (ref 96–106)
Creatinine, Ser: 0.9 mg/dL (ref 0.76–1.27)
GFR calc Af Amer: 105 mL/min/{1.73_m2} (ref >59)
GFR calc non Af Amer: 91 mL/min/{1.73_m2} (ref >59)
Glucose: 157 mg/dL — ABNORMAL HIGH (ref 65–99)
Potassium: 4.6 mmol/L (ref 3.5–5.2)
Sodium: 142 mmol/L (ref 134–144)

## 2018-11-11 LAB — MICROALBUMIN / CREATININE URINE RATIO
Creatinine, Urine: 131 mg/dL
Microalb/Creat Ratio: 4 mg/g creat (ref 0–29)
Microalbumin, Urine: 4.9 ug/mL

## 2018-11-11 LAB — HGB A1C W/O EAG: Hgb A1c MFr Bld: 7.5 % — ABNORMAL HIGH (ref 4.8–5.6)

## 2018-11-17 ENCOUNTER — Encounter: Payer: Self-pay | Admitting: Registered Nurse

## 2018-11-17 ENCOUNTER — Other Ambulatory Visit: Payer: Self-pay

## 2018-11-17 ENCOUNTER — Ambulatory Visit: Payer: Managed Care, Other (non HMO) | Admitting: Registered Nurse

## 2018-11-17 VITALS — BP 134/79 | HR 80 | Temp 97.9°F | Resp 16 | Ht 73.0 in | Wt 283.0 lb

## 2018-11-17 DIAGNOSIS — M1712 Unilateral primary osteoarthritis, left knee: Secondary | ICD-10-CM

## 2018-11-17 DIAGNOSIS — E6609 Other obesity due to excess calories: Secondary | ICD-10-CM

## 2018-11-17 DIAGNOSIS — E1169 Type 2 diabetes mellitus with other specified complication: Secondary | ICD-10-CM

## 2018-11-17 DIAGNOSIS — Z23 Encounter for immunization: Secondary | ICD-10-CM

## 2018-11-17 NOTE — Patient Instructions (Addendum)
Diabetes Mellitus and Foot Care Foot care is an important part of your health, especially when you have diabetes. Diabetes may cause you to have problems because of poor blood flow (circulation) to your feet and legs, which can cause your skin to:  Become thinner and drier.  Break more easily.  Heal more slowly.  Peel and crack. You may also have nerve damage (neuropathy) in your legs and feet, causing decreased feeling in them. This means that you may not notice minor injuries to your feet that could lead to more serious problems. Noticing and addressing any potential problems early is the best way to prevent future foot problems. How to care for your feet Foot hygiene  Wash your feet daily with warm water and mild soap. Do not use hot water. Then, pat your feet and the areas between your toes until they are completely dry. Do not soak your feet as this can dry your skin.  Trim your toenails straight across. Do not dig under them or around the cuticle. File the edges of your nails with an emery board or nail file.  Apply a moisturizing lotion or petroleum jelly to the skin on your feet and to dry, brittle toenails. Use lotion that does not contain alcohol and is unscented. Do not apply lotion between your toes. Shoes and socks  Wear clean socks or stockings every day. Make sure they are not too tight. Do not wear knee-high stockings since they may decrease blood flow to your legs.  Wear shoes that fit properly and have enough cushioning. Always look in your shoes before you put them on to be sure there are no objects inside.  To break in new shoes, wear them for just a few hours a day. This prevents injuries on your feet. Wounds, scrapes, corns, and calluses  Check your feet daily for blisters, cuts, bruises, sores, and redness. If you cannot see the bottom of your feet, use a mirror or ask someone for help.  Do not cut corns or calluses or try to remove them with medicine.  If you  find a minor scrape, cut, or break in the skin on your feet, keep it and the skin around it clean and dry. You may clean these areas with mild soap and water. Do not clean the area with peroxide, alcohol, or iodine.  If you have a wound, scrape, corn, or callus on your foot, look at it several times a day to make sure it is healing and not infected. Check for: ? Redness, swelling, or pain. ? Fluid or blood. ? Warmth. ? Pus or a bad smell. General instructions  Do not cross your legs. This may decrease blood flow to your feet.  Do not use heating pads or hot water bottles on your feet. They may burn your skin. If you have lost feeling in your feet or legs, you may not know this is happening until it is too late.  Protect your feet from hot and cold by wearing shoes, such as at the beach or on hot pavement.  Schedule a complete foot exam at least once a year (annually) or more often if you have foot problems. If you have foot problems, report any cuts, sores, or bruises to your health care provider immediately. Contact a health care provider if:  You have a medical condition that increases your risk of infection and you have any cuts, sores, or bruises on your feet.  You have an injury that is not   healing.  You have redness on your legs or feet.  You feel burning or tingling in your legs or feet.  You have pain or cramps in your legs and feet.  Your legs or feet are numb.  Your feet always feel cold.  You have pain around a toenail. Get help right away if:  You have a wound, scrape, corn, or callus on your foot and: ? You have pain, swelling, or redness that gets worse. ? You have fluid or blood coming from the wound, scrape, corn, or callus. ? Your wound, scrape, corn, or callus feels warm to the touch. ? You have pus or a bad smell coming from the wound, scrape, corn, or callus. ? You have a fever. ? You have a red line going up your leg. Summary  Check your feet every day  for cuts, sores, red spots, swelling, and blisters.  Moisturize feet and legs daily.  Wear shoes that fit properly and have enough cushioning.  If you have foot problems, report any cuts, sores, or bruises to your health care provider immediately.  Schedule a complete foot exam at least once a year (annually) or more often if you have foot problems. This information is not intended to replace advice given to you by your health care provider. Make sure you discuss any questions you have with your health care provider. Document Released: 01/25/2000 Document Revised: 03/11/2017 Document Reviewed: 02/29/2016 Elsevier Patient Education  2020 [employer]. Diabetes Mellitus and Exercise Exercising regularly is important for your overall health, especially when you have diabetes (diabetes mellitus). Exercising is not only about losing weight. It has many other health benefits, such as increasing muscle strength and bone density and reducing body fat and stress. This leads to improved fitness, flexibility, and endurance, all of which result in better overall health. Exercise has additional benefits for people with diabetes, including:  Reducing appetite.  Helping to lower and control blood glucose.  Lowering blood pressure.  Helping to control amounts of fatty substances (lipids) in the blood, such as cholesterol and triglycerides.  Helping the body to respond better to insulin (improving insulin sensitivity).  Reducing how much insulin the body needs.  Decreasing the risk for heart disease by: ? Lowering cholesterol and triglyceride levels. ? Increasing the levels of good cholesterol. ? Lowering blood glucose levels. What is my activity plan? Your health care provider or certified diabetes educator can help you make a plan for the type and frequency of exercise (activity plan) that works for you. Make sure that you:  Do at least 150 minutes of moderate-intensity or vigorous-intensity  exercise each week. This could be brisk walking, biking, or water aerobics. ? Do stretching and strength exercises, such as yoga or weightlifting, at least 2 times a week. ? Spread out your activity over at least 3 days of the week.  Get some form of physical activity every day. ? Do not go more than 2 days in a row without some kind of physical activity. ? Avoid being inactive for more than 30 minutes at a time. Take frequent breaks to walk or stretch.  Choose a type of exercise or activity that you enjoy, and set realistic goals.  Start slowly, and gradually increase the intensity of your exercise over time. What do I need to know about managing my diabetes?   Check your blood glucose before and after exercising. ? If your blood glucose is 240 mg/dL (13.3 mmol/L) or higher before you exercise, check your  urine for ketones. If you have ketones in your urine, do not exercise until your blood glucose returns to normal. ? If your blood glucose is 100 mg/dL (5.6 mmol/L) or lower, eat a snack containing 15-20 grams of carbohydrate. Check your blood glucose 15 minutes after the snack to make sure that your level is above 100 mg/dL (5.6 mmol/L) before you start your exercise.  Know the symptoms of low blood glucose (hypoglycemia) and how to treat it. Your risk for hypoglycemia increases during and after exercise. Common symptoms of hypoglycemia can include: ? Hunger. ? Anxiety. ? Sweating and feeling clammy. ? Confusion. ? Dizziness or feeling light-headed. ? Increased heart rate or palpitations. ? Blurry vision. ? Tingling or numbness around the mouth, lips, or tongue. ? Tremors or shakes. ? Irritability.  Keep a rapid-acting carbohydrate snack available before, during, and after exercise to help prevent or treat hypoglycemia.  Avoid injecting insulin into areas of the body that are going to be exercised. For example, avoid injecting insulin into: ? The arms, when playing tennis. ? The  legs, when jogging.  Keep records of your exercise habits. Doing this can help you and your health care provider adjust your diabetes management plan as needed. Write down: ? Food that you eat before and after you exercise. ? Blood glucose levels before and after you exercise. ? The type and amount of exercise you have done. ? When your insulin is expected to peak, if you use insulin. Avoid exercising at times when your insulin is peaking.  When you start a new exercise or activity, work with your health care provider to make sure the activity is safe for you, and to adjust your insulin, medicines, or food intake as needed.  Drink plenty of water while you exercise to prevent dehydration or heat stroke. Drink enough fluid to keep your urine clear or pale yellow. Summary  Exercising regularly is important for your overall health, especially when you have diabetes (diabetes mellitus).  Exercising has many health benefits, such as increasing muscle strength and bone density and reducing body fat and stress.  Your health care provider or certified diabetes educator can help you make a plan for the type and frequency of exercise (activity plan) that works for you.  When you start a new exercise or activity, work with your health care provider to make sure the activity is safe for you, and to adjust your insulin, medicines, or food intake as needed. This information is not intended to replace advice given to you by your health care provider. Make sure you discuss any questions you have with your health care provider. Document Released: 04/19/2003 Document Revised: 08/21/2016 Document Reviewed: 07/09/2015 Elsevier Patient Education  2020 [employer] Risks of Being Overweight Maintaining a healthy body weight is an important part of your overall health. Your healthy body weight depends on your age, gender, and height. Being overweight puts you at risk for many health problems,  including:  Heart disease.  Diabetes.  Problems sleeping.  Joint problems. You can make changes to your diet and lifestyle to prevent these risks. Consider working with a health care provider or a dietitian to make these changes. What nutrition changes can be made?   Eat only as much as your body needs. In most cases, this is about 2,000 calories a day, but the amount varies depending on your height, gender, and activity level. Ask your health care provider how many calories you should have each day.  Eating more than your body needs on a regular basis can cause you to become overweight or obese.  Eat slowly, and stop eating when you feel full.  Choose healthy foods, including: ? Fruits and vegetables. ? Lean meats. ? Low-fat dairy products. ? High-fiber foods, such as whole grains and beans. ? Healthy snacks like vegetable sticks, a piece of fruit, or a small amount of yogurt or cheese.  Avoid foods and drinks that are high in sugar, salt (sodium), saturated fat, or trans fat. This includes: ? Many desserts such as candy, cookies, and ice cream. ? Soda. ? Fried foods. ? Processed meats such as hot dogs or lunch meats. ? Prepackaged snack foods. What lifestyle changes can be made?   Exercise for at least 150 minutes a week to prevent weight gain, or as often as recommended by your health care provider. Do moderate-intensity exercise, such as brisk walking. ? Spread it out by exercising for 30 minutes 5 days a week, or in short 10-minute bursts several times a day.  Find other ways to stay active and burn calories, such as yard work or a hobby that involves physical activity.  Get at least 8 hours of sleep each night. When you are well-rested, you are more likely to be active and make healthy choices during the day. To sleep better: ? Try to go to bed and wake up at about the same time every day. ? Keep your bedroom dark, quiet, and cool. ? Make sure that your bed is  comfortable. ? Avoid stimulating activities, such as watching television or exercising, for at least one hour before bedtime. Why are these changes important? Eating healthy and being active helps you lose weight and prevent health problems caused by being overweight. Making these changes can also help you manage stress, feel better mentally, and connect with friends and family. What can happen if changes are not made? Being overweight can affect you for your entire life. You may develop joint or bone problems that make it painful or difficult for you to play sports or do activities you enjoy. Being overweight puts stress on your heart and lungs and can lead to medical problems like diabetes, heart disease, and sleeping problems. Where to find support You can get support for preventing health risks of being overweight from:  Your health care provider or a dietitian. They can provide guidance about healthy eating and healthy lifestyle choices.  Weight loss support groups, online or in-person. Where to find more information  MyPlate: FormerBoss.no ? This an online tool that provides personalized recommendations about foods to eat each day.  The Centers for Disease Control and Prevention: http://sharp-hammond.biz/ ? This resource gives tips for managing weight and having an active lifestyle. Summary  To prevent unhealthy weight gain, it is important to maintain a healthy diet high in vegetables and whole grains, exercise regularly, and get at least 8 hours of sleep each night.  Making these changes helps prevent many long-term (chronic) health conditions that can shorten your life, such as diabetes, heart disease, and stroke. This information is not intended to replace advice given to you by your health care provider. Make sure you discuss any questions you have with your health care provider. Document Released: 12/24/2016 Document Revised: 01/30/2017 Document Reviewed: 12/24/2016  Elsevier Patient Education  2020 [employer].  What You Need to Know About Osteoarthritis Osteoarthritis is a type of arthritis that affects tissue that covers the ends of bones in joints (cartilage). Cartilage acts  as a cushion between the bones and helps them move smoothly. Osteoarthritis results when cartilage in the joints gets worn down. Osteoarthritis is sometimes called "wear and tear" arthritis. Osteoarthritis can affect any joint and can make movement painful. Hips, knees, fingers, and toes are some of the joints that are most often affected by osteoarthritis. You may be more likely to develop osteoarthritis if:  You are middle-aged or older.  You are obese.  You have injured a joint or had surgery on a joint.  You have a family history of osteoarthritis. How can osteoarthritis affect me? Osteoarthritis can cause:  Pain and swelling in your joint.  Difficulty moving your joint.  A grating or scraping feeling inside the joint when you move it.  Popping or creaking sounds when you move. This condition can make it harder to do things that you need or want to do each day. Osteoarthritis in a major joint, such as your knee or hip, can make it painful to walk or exercise. If you have osteoarthritis in your hands, you might not be able to grip items, twist your hand, or control small movements of your hands and fingers (fine motor skills). Over time, osteoarthritis could cause you to be less physically active. Being less active increases your risk for other long-term (chronic) health problems, such as diabetes and heart disease. What lifestyle changes can be made? You can lessen the impact that osteoarthritis has on your daily life by:  Switching to low-impact activities that do not put repeated pressure on your joints. For example, if you usually run or jog for exercise, try swimming or riding a bike instead.  Staying active. Build up to at least 150 minutes of physical activity  each week to keep your joints healthy and keep your body strong.  Losing weight. If you are overweight or obese, losing weight can take pressure off of your joints. If you need help with weight loss, talk with your health care provider or a diet and nutrition specialist (dietitian). What other changes can be made? You can also lessen the effect of osteoarthritis by:  Using assistive devices. Sometimes a brace, wrap, splint, specialized glove, or cane can help. Talk with your health care provider or physical therapist about when and how to use these.  Working with a physical therapist who can help you find ways to do your daily activities without harming your joints. A physical therapist can also teach you exercises and stretches to strengthen the muscles that support your joints.  Treating pain and inflammation. Take over-the-counter and prescription medicines for pain and inflammation only as told by your health care provider. If directed, you may put ice on the affected joint: ? If you have a removable assistive device, remove it as told by your health care provider. ? Put ice in a plastic bag. ? Place a towel between your skin and the bag. ? Leave the ice on for 20 minutes, 2-3 times a day. If other measures do not work, you may need joint surgery, such as joint replacement. What can happen if changes are not made? Osteoarthritis is a condition that gets worse over time (a progressive condition). If you do not take steps to strengthen your body and to slow down the progress of the disease, your condition may get worse more quickly. Your joints may stiffen and become swollen, which will make them painful and hard to move. Where to find more information You can learn more about osteoarthritis from:  The  Arthritis Foundation: www.RadioScam.is  [employer] of Arthritis and Musculoskeletal and Skin Diseases:  www.niams.CityPerson.tn Contact a health care provider if:  You cannot do your normal activities comfortably.  Your joint does not function at all.  Your pain is interfering with your sleep.  You are gaining weight.  Your joint appears to be changing in shape, instead of just being swollen and sore. Summary  Osteoarthritis is a painful joint disease that gets worse over time.  This condition can lead to other long-term (chronic) health problems.  There are changes that you can make to slow down the progression of the disease. This information is not intended to replace advice given to you by your health care provider. Make sure you discuss any questions you have with your health care provider. Document Released: 09/18/2015 Document Revised: 01/09/2017 Document Reviewed: 09/18/2015 Elsevier Patient Education  [place]. Influenza (Flu) Vaccine (Inactivated or Recombinant): What You Need to Know 1. Why get vaccinated? Influenza vaccine can prevent influenza (flu). Flu is a contagious disease that spreads around the [place] every year, usually between October and May. Anyone can get the flu, but it is more dangerous for some people. Infants and young children, people 33 years of age and older, pregnant women, and people with certain health conditions or a weakened immune system are at greatest risk of flu complications. Pneumonia, bronchitis, sinus infections and ear infections are examples of flu-related complications. If you have a medical condition, such as heart disease, cancer or diabetes, flu can make it worse. Flu can cause fever and chills, sore throat, muscle aches, fatigue, cough, headache, and runny or stuffy nose. Some people may have vomiting and diarrhea, though this is more common in children than adults. Each year thousands of people in the [place] die from flu, and many more are hospitalized. Flu vaccine  prevents millions of illnesses and flu-related visits to the doctor each year. 2. Influenza vaccine CDC recommends everyone 60 months of age and older get vaccinated every flu season. Children 6 months through 31 years of age may need 2 doses during a single flu season. Everyone else needs only 1 dose each flu season. It takes about 2 weeks for protection to develop after vaccination. There are many flu viruses, and they are always changing. Each year a new flu vaccine is made to protect against three or four viruses that are likely to cause disease in the upcoming flu season. Even when the vaccine doesn't exactly match these viruses, it may still provide some protection. Influenza vaccine does not cause flu. Influenza vaccine may be given at the same time as other vaccines. 3. Talk with your health care provider Tell your vaccine provider if the person getting the vaccine:  Has had an allergic reaction after a previous dose of influenza vaccine, or has any severe, life-threatening allergies.  Has ever had Guillain-Barr Syndrome (also called GBS). In some cases, your health care provider may decide to postpone influenza vaccination to a future visit. People with minor illnesses, such as a cold, may be vaccinated. People who are moderately or severely ill should usually wait until they recover before getting influenza vaccine. Your health care provider can give you more information. 4. Risks of a vaccine reaction  Soreness, redness, and swelling where shot is given, fever, muscle aches, and headache can happen after influenza vaccine.  There may be a very small increased risk of Guillain-Barr Syndrome (GBS) after inactivated influenza vaccine (the flu shot). Young children who  get the flu shot along with pneumococcal vaccine (PCV13), and/or DTaP vaccine at the same time might be slightly more likely to have a seizure caused by fever. Tell your health care provider if a child who is getting flu  vaccine has ever had a seizure. People sometimes faint after medical procedures, including vaccination. Tell your provider if you feel dizzy or have vision changes or ringing in the ears. As with any medicine, there is a very remote chance of a vaccine causing a severe allergic reaction, other serious injury, or death. 5. What if there is a serious problem? An allergic reaction could occur after the vaccinated person leaves the clinic. If you see signs of a severe allergic reaction (hives, swelling of the face and throat, difficulty breathing, a fast heartbeat, dizziness, or weakness), call 9-1-1 and get the person to the nearest hospital. For other signs that concern you, call your health care provider. Adverse reactions should be reported to the Vaccine Adverse Event Reporting System (VAERS). Your health care provider will usually file this report, or you can do it yourself. Visit the VAERS website at www.vaers.SamedayNews.es or call 9803663756.VAERS is only for reporting reactions, and VAERS staff do not give medical advice. 6. The National Vaccine Injury Compensation Program The [employer] Vaccine Injury Compensation Program (VICP) is a federal program that was created to compensate people who may have been injured by certain vaccines. Visit the VICP website at GoldCloset.com.ee or call (475) 452-8457 to learn about the program and about filing a claim. There is a time limit to file a claim for compensation. 7. How can I learn more?  Ask your healthcare provider.  Call your local or state health department.  Contact the Centers for Disease Control and Prevention (CDC): ? Call 3865328868 (1-800-CDC-INFO) or ? Visit CDC's https://gibson.com/ Vaccine Information Statement (Interim) Inactivated Influenza Vaccine (09/24/2017) This information is not intended to replace advice given to you by your health care provider. Make sure you discuss any questions you have with your health care  provider. Document Released: 11/21/2005 Document Revised: 05/18/2018 Document Reviewed: 09/28/2017 Elsevier Patient Education  2020 [employer].

## 2018-11-17 NOTE — Progress Notes (Signed)
Subjective:    Patient ID: Daniel Bradley., male    DOB: 1955/08/13, 63 y.o.   MRN: [id number]  62y/o established african [place] male here for diabetes follow up.  Last appt with Dr Roxan Hockey 09/09/2018.  Labs Mar 2020.  Weight   Had dental implants placed Aug 2020 and has been only eating soft foods, ice cream, sweet cakes due to mouth soreness.  Blood sugars at home 120s-150s not checking every day. Hasn't seen optometrist in the past year or had flu shot yet.  Follow up with orthopedics for knee due this month he needs to schedule.  He stated he would schedule optometry appt.  Atorvastatin increased to 80mg  po daily  In June from 40mg .  Hasn't had lipids rechecked since then last LDL 115 Mar 2020.  Left knee bone on bone per patient and he is awaiting knee implant.  Right hurts but not as bad as left especially on the stairs.  With twisting motions left knee "sometimes slips/gives out"  Denied any falls.  Stated he needs to schedule his orthopedics follow up with his provider and get back into the gym. Patient stated he has enough medication to last him a month at home and unsure if he needs refills at this time. Denied seasonal allergies or problems.       Review of Systems  Constitutional: Negative for activity change, appetite change, chills, diaphoresis, fatigue, fever and unexpected weight change.  HENT: Negative for sore throat, trouble swallowing and voice change.   Eyes: Negative for photophobia, pain, discharge, redness, itching and visual disturbance.  Respiratory: Negative for cough, shortness of breath, wheezing and stridor.   Cardiovascular: Negative for chest pain, palpitations and leg swelling.  Gastrointestinal: Negative for abdominal pain, constipation, diarrhea, nausea and vomiting.  Endocrine: Negative for polydipsia, polyphagia and polyuria.  Genitourinary: Negative for difficulty urinating.  Musculoskeletal: Positive for arthralgias and gait problem. Negative for  joint swelling, myalgias, neck pain and neck stiffness.  Skin: Negative for color change, pallor, rash and wound.  Allergic/Immunologic: Positive for immunocompromised state. Negative for environmental allergies and food allergies.  Neurological: Negative for dizziness, tremors, seizures, syncope, facial asymmetry, speech difficulty, weakness, light-headedness, numbness and headaches.  Hematological: Negative for adenopathy. Does not bruise/bleed easily.  Psychiatric/Behavioral: Negative for agitation, confusion and sleep disturbance.       Objective:   Physical Exam Vitals signs and nursing note reviewed.  Constitutional:      General: He is not in acute distress.    Appearance: Normal appearance. He is well-developed. He is obese. He is not ill-appearing, toxic-appearing or diaphoretic.  HENT:     Head: Normocephalic and atraumatic.     Jaw: There is normal jaw occlusion. No trismus.     Salivary Glands: Right salivary gland is not diffusely enlarged or tender. Left salivary gland is not diffusely enlarged or tender.     Right Ear: Hearing, ear canal and external ear normal. A middle ear effusion is present.     Left Ear: Hearing, ear canal and external ear normal. A middle ear effusion is present.     Nose: Nose normal. No nasal deformity, septal deviation, signs of injury, laceration, nasal tenderness, mucosal edema, congestion or rhinorrhea.     Right Turbinates: Not enlarged, swollen or pale.     Left Turbinates: Not enlarged, swollen or pale.     Right Sinus: No maxillary sinus tenderness or frontal sinus tenderness.     Left Sinus: No maxillary sinus tenderness or  frontal sinus tenderness.     Mouth/Throat:     Lips: Pink. No lesions.     Mouth: Mucous membranes are moist. Mucous membranes are not pale, not dry and not cyanotic. No lacerations, oral lesions or angioedema.     Dentition: Abnormal dentition. Does not have dentures. Dental tenderness present. No dental abscesses.      Tongue: No lesions. Tongue does not deviate from midline.     Palate: No mass and lesions.     Pharynx: Oropharynx is clear. Uvula midline. No pharyngeal swelling, oropharyngeal exudate, posterior oropharyngeal erythema or uvula swelling.     Tonsils: No tonsillar exudate or tonsillar abscesses.  Eyes:     General: Lids are normal. No visual field deficit or scleral icterus.       Right eye: No foreign body, discharge or hordeolum.        Left eye: No foreign body, discharge or hordeolum.     Extraocular Movements: Extraocular movements intact.     Right eye: Normal extraocular motion and no nystagmus.     Left eye: Normal extraocular motion and no nystagmus.     Conjunctiva/sclera: Conjunctivae normal.     Right eye: Right conjunctiva is not injected. No chemosis, exudate or hemorrhage.    Left eye: Left conjunctiva is not injected. No chemosis, exudate or hemorrhage.    Pupils: Pupils are equal, round, and reactive to light. Pupils are equal.     Right eye: Pupil is round and reactive.     Left eye: Pupil is round and reactive.  Neck:     Musculoskeletal: Normal range of motion and neck supple. Normal range of motion. No edema, erythema, neck rigidity, spinous process tenderness or muscular tenderness.     Thyroid: No thyroid mass or thyromegaly.     Trachea: Trachea normal. No tracheal tenderness or tracheal deviation.  Cardiovascular:     Rate and Rhythm: Normal rate and regular rhythm.     Chest Wall: PMI is not displaced.     Pulses: Normal pulses.     Heart sounds: Normal heart sounds, S1 normal and S2 normal. No murmur. No friction rub. No gallop.   Pulmonary:     Effort: Pulmonary effort is normal. No respiratory distress.     Breath sounds: Normal breath sounds and air entry. No stridor, decreased air movement or transmitted upper airway sounds. No decreased breath sounds, wheezing, rhonchi or rales.     Comments: Wearing cloth face mask due to covid 19 pandemic; no cough  observed in exam room; spoke full sentences without difficulty Abdominal:     General: There is no distension.     Palpations: Abdomen is soft.  Musculoskeletal: Normal range of motion.        General: No swelling, tenderness, deformity or signs of injury.     Right shoulder: Normal.     Left shoulder: Normal.     Right elbow: Normal.    Left elbow: Normal.     Right hip: Normal.     Left hip: Normal.     Right ankle: Normal.     Left ankle: Normal.     Cervical back: Normal.     Thoracic back: Normal.     Lumbar back: Normal.     Right hand: Normal.     Left hand: Normal.     Right lower leg: No edema.     Left lower leg: No edema.     Comments: Bilateral knee pain with weight  bearing; left worsens with stairs and twisting motions; gait sure and steady and slow in clinic; in/out of chair and on/off exam table without difficulty  Lymphadenopathy:     Head:     Right side of head: No submental, submandibular, tonsillar, preauricular, posterior auricular or occipital adenopathy.     Left side of head: No submental, submandibular, tonsillar, preauricular, posterior auricular or occipital adenopathy.     Cervical: No cervical adenopathy.     Right cervical: No superficial, deep or posterior cervical adenopathy.    Left cervical: No superficial, deep or posterior cervical adenopathy.  Skin:    General: Skin is warm and dry.     Capillary Refill: Capillary refill takes less than 2 seconds.     Coloration: Skin is not ashen, cyanotic, jaundiced, mottled, pale or sallow.     Findings: No abrasion, abscess, acne, bruising, burn, ecchymosis, erythema, signs of injury, laceration, lesion, petechiae, rash or wound.     Nails: There is no clubbing.   Neurological:     General: No focal deficit present.     Mental Status: He is alert and oriented to person, place, and time. Mental status is at baseline.     GCS: GCS eye subscore is 4. GCS verbal subscore is 5. GCS motor subscore is 6.      Cranial Nerves: Cranial nerves are intact. No cranial nerve deficit, dysarthria or facial asymmetry.     Sensory: Sensation is intact. No sensory deficit.     Motor: Motor function is intact. No weakness, tremor, atrophy, abnormal muscle tone or seizure activity.     Coordination: Coordination is intact. Coordination normal.     Gait: Gait is intact. Gait normal.     Comments: In/out of chair and on/off exam table without difficulty; gait sure and steady in hallway slow no limp  Psychiatric:        Attention and Perception: Attention and perception normal.        Mood and Affect: Mood and affect normal.        Speech: Speech normal.        Behavior: Behavior normal. Behavior is cooperative.        Thought Content: Thought content normal.        Cognition and Memory: Cognition and memory normal.        Judgment: Judgment normal.           Assessment & Plan:  A-Type II diabetes without long term insulin use, obesity BMI 37.3, need for influenza vaccine, osteoarthritis left knee  P-Follow up in 3 months for labs.  HgbA1c 7.5 and glucose 157.  March 2020 7.2 and 144 worsening slightly and not at goal.  Patient going to work on diet hopefully will be eating more solid foods next month when his dental implants finished--currently just metal posts in place and still healing.  Discussed eating more fruits/vegetables and whole grains with lean protein instead of sweets/ice cream/cake.  Patient has lost weight since his last appt with Dr Roxan Hockey unintentionally.  RN Raenette Rover gave patient quadrivalent flu vaccination IM x 1 today.  Exitcare handout diabetes.  Increase low impact activities and decrease non-nutritional calories.  Keep spot checking blood sugars at home.  Patient verbalized understanding information/instructions, agreed with plan of care and had no further questions at this time.  Schedule follow up with orthopedics.  Discussed with patient typically if mouth infection orthopedics  wants to hold on knee replacement surgery until all infections treated and healed.  Avoid weight gain; work on his leg exercises and weight loss.  Low impact activities recommended.  Gyms have been closed due to covid 19 pandemic and just reopening as of last Friday when state entered phase 3. Exitcare handout osteoarthritis. Patient verbalized understanding information/instructions, agreed with plan of care and had no further questions at this time.

## 2018-11-17 NOTE — Progress Notes (Signed)
Covid test on 10/31/2018 - Negative results Tested at [facility]

## 2018-12-13 DIAGNOSIS — I2510 Atherosclerotic heart disease of native coronary artery without angina pectoris: Secondary | ICD-10-CM | POA: Diagnosis not present

## 2018-12-13 DIAGNOSIS — I214 Non-ST elevation (NSTEMI) myocardial infarction: Secondary | ICD-10-CM | POA: Diagnosis not present

## 2018-12-13 DIAGNOSIS — E119 Type 2 diabetes mellitus without complications: Secondary | ICD-10-CM | POA: Diagnosis not present

## 2018-12-13 DIAGNOSIS — I10 Essential (primary) hypertension: Secondary | ICD-10-CM | POA: Diagnosis not present

## 2018-12-14 DIAGNOSIS — G4733 Obstructive sleep apnea (adult) (pediatric): Secondary | ICD-10-CM | POA: Diagnosis not present

## 2019-01-13 DIAGNOSIS — G4733 Obstructive sleep apnea (adult) (pediatric): Secondary | ICD-10-CM | POA: Diagnosis not present

## 2019-02-01 DIAGNOSIS — G8929 Other chronic pain: Secondary | ICD-10-CM | POA: Diagnosis not present

## 2019-02-01 DIAGNOSIS — M25562 Pain in left knee: Secondary | ICD-10-CM | POA: Diagnosis not present

## 2019-02-01 DIAGNOSIS — M1712 Unilateral primary osteoarthritis, left knee: Secondary | ICD-10-CM | POA: Diagnosis not present

## 2019-02-13 DIAGNOSIS — G4733 Obstructive sleep apnea (adult) (pediatric): Secondary | ICD-10-CM | POA: Diagnosis not present

## 2019-02-22 ENCOUNTER — Ambulatory Visit: Payer: Managed Care, Other (non HMO)

## 2019-02-22 ENCOUNTER — Other Ambulatory Visit: Payer: Self-pay

## 2019-02-22 VITALS — BP 160/90 | HR 68 | Resp 16

## 2019-02-22 DIAGNOSIS — Z0000 Encounter for general adult medical examination without abnormal findings: Secondary | ICD-10-CM

## 2019-02-22 LAB — POCT URINALYSIS DIPSTICK
Bilirubin, UA: NEGATIVE
Blood, UA: NEGATIVE
Glucose, UA: NEGATIVE
Ketones, UA: NEGATIVE
Leukocytes, UA: NEGATIVE
Nitrite, UA: NEGATIVE
Protein, UA: NEGATIVE
Spec Grav, UA: >=1.03 — AB (ref 1.010–1.025)
Urobilinogen, UA: 0.2 E.U./dL
pH, UA: 5.5 (ref 5.0–8.0)

## 2019-02-22 NOTE — Progress Notes (Addendum)
Scheduled to complete physical 03/02/2019 with Randel Pigg, PA-C (Interim Provider).  AMD  After having blood drawn, patient walked to Exam Room 1 for EKG.  After lying on table, C/O dizziness & nausea.  Vitals checked & stable, cool compresses applied to forehead & neck, fan turned on & emesis bag provided.  After a about 5 minutes he said he felt fine & proceeded with EKG which showed normal sinus rhythm.  States he felt this way at home this morning upon getting out of bed at home.  "I don't have a prostate, so when I wake up in the mornings, I have to go to urinate right away - urgent".  Said he checked his BP & it was 150/90's.  When Leoncio got off the exam table, he jumped down quickly & got a little dizzy again.  Had him sit in chair for a few minutes.  Declined crackers and/or water when offered.  Sat in chair for about 5 minutes & said he was good to go.  AMD

## 2019-02-24 LAB — CMP12+LP+TP+TSH+6AC+PSA+CBC…
ALT: 19 IU/L (ref 0–44)
AST: 20 IU/L (ref 0–40)
Albumin/Globulin Ratio: 1.7 (ref 1.2–2.2)
Albumin: 4.3 g/dL (ref 3.8–4.8)
Alkaline Phosphatase: 129 IU/L — ABNORMAL HIGH (ref 39–117)
BUN/Creatinine Ratio: 16 (ref 10–24)
BUN: 15 mg/dL (ref 8–27)
Basophils Absolute: 0.1 10*3/uL (ref 0.0–0.2)
Basos: 1 %
Bilirubin Total: 0.7 mg/dL (ref 0.0–1.2)
Calcium: 9.6 mg/dL (ref 8.6–10.2)
Chloride: 103 mmol/L (ref 96–106)
Chol/HDL Ratio: 3.1 ratio (ref 0.0–5.0)
Cholesterol, Total: 162 mg/dL (ref 100–199)
Creatinine, Ser: 0.92 mg/dL (ref 0.76–1.27)
EOS (ABSOLUTE): 0.4 10*3/uL (ref 0.0–0.4)
Eos: 7 %
Estimated CHD Risk: <0.5 times avg. (ref 0.0–1.0)
Free Thyroxine Index: 1.3 (ref 1.2–4.9)
GFR calc Af Amer: 102 mL/min/{1.73_m2} (ref >59)
GFR calc non Af Amer: 88 mL/min/{1.73_m2} (ref >59)
GGT: 39 IU/L (ref 0–65)
Globulin, Total: 2.5 g/dL (ref 1.5–4.5)
Glucose: 170 mg/dL — ABNORMAL HIGH (ref 65–99)
HDL: 52 mg/dL (ref >39)
Hematocrit: 42.8 % (ref 37.5–51.0)
Hemoglobin: 13.5 g/dL (ref 13.0–17.7)
Immature Grans (Abs): 0 10*3/uL (ref 0.0–0.1)
Immature Granulocytes: 0 %
Iron: 66 ug/dL (ref 38–169)
LDH: 195 IU/L (ref 121–224)
LDL Chol Calc (NIH): 95 mg/dL (ref 0–99)
Lymphocytes Absolute: 1.6 10*3/uL (ref 0.7–3.1)
Lymphs: 27 %
MCH: 25 pg — ABNORMAL LOW (ref 26.6–33.0)
MCHC: 31.5 g/dL (ref 31.5–35.7)
MCV: 79 fL (ref 79–97)
Monocytes Absolute: 0.6 10*3/uL (ref 0.1–0.9)
Monocytes: 11 %
Neutrophils Absolute: 3.3 10*3/uL (ref 1.4–7.0)
Neutrophils: 54 %
Phosphorus: 3.7 mg/dL (ref 2.8–4.1)
Platelets: 311 10*3/uL (ref 150–450)
Potassium: 4.3 mmol/L (ref 3.5–5.2)
Prostate Specific Ag, Serum: <0.1 ng/mL (ref 0.0–4.0)
RBC: 5.41 x10E6/uL (ref 4.14–5.80)
RDW: 14.5 % (ref 11.6–15.4)
Sodium: 138 mmol/L (ref 134–144)
T3 Uptake Ratio: 21 % — ABNORMAL LOW (ref 24–39)
T4, Total: 6.4 ug/dL (ref 4.5–12.0)
TSH: 3.39 u[IU]/mL (ref 0.450–4.500)
Total Protein: 6.8 g/dL (ref 6.0–8.5)
Triglycerides: 77 mg/dL (ref 0–149)
Uric Acid: 5.4 mg/dL (ref 3.8–8.4)
VLDL Cholesterol Cal: 15 mg/dL (ref 5–40)
WBC: 6 10*3/uL (ref 3.4–10.8)

## 2019-02-24 LAB — MICROALBUMIN / CREATININE URINE RATIO
Creatinine, Urine: 244.2 mg/dL
Microalb/Creat Ratio: 6 mg/g creat (ref 0–29)
Microalbumin, Urine: 13.9 ug/mL

## 2019-02-24 LAB — HGB A1C W/O EAG: Hgb A1c MFr Bld: 7.5 % — ABNORMAL HIGH (ref 4.8–5.6)

## 2019-02-28 ENCOUNTER — Ambulatory Visit: Payer: Self-pay

## 2019-02-28 DIAGNOSIS — Z20828 Contact with and (suspected) exposure to other viral communicable diseases: Secondary | ICD-10-CM | POA: Diagnosis not present

## 2019-03-07 ENCOUNTER — Ambulatory Visit: Payer: Managed Care, Other (non HMO) | Admitting: Occupational Medicine

## 2019-03-07 ENCOUNTER — Other Ambulatory Visit: Payer: Self-pay

## 2019-03-07 VITALS — BP 140/80 | HR 95 | Temp 98.5°F | Ht 73.0 in | Wt 281.2 lb

## 2019-03-07 DIAGNOSIS — G4733 Obstructive sleep apnea (adult) (pediatric): Secondary | ICD-10-CM

## 2019-03-07 DIAGNOSIS — G4700 Insomnia, unspecified: Secondary | ICD-10-CM

## 2019-03-07 DIAGNOSIS — Z0000 Encounter for general adult medical examination without abnormal findings: Secondary | ICD-10-CM

## 2019-03-07 NOTE — Progress Notes (Signed)
Patient ID: Tomasa Blase. DOB: 12-19-1955 AGE: 64 y.o. MRN: [id number]   PCP: No primary care provider on file.   Chief Complaint:  Chief Complaint  Patient presents with  . Annual Exam  . [phone] screening    no symptoms     Subjective:    HPI:  Dalas Hartshorne. is a 64 y.o. male presents for evaluation  Chief Complaint  Patient presents with  . Annual Exam  . [phone] screening    no symptoms    64 year old male presents to [facility] for annual examination.  Patient managed by clinic for HTN, hyperlipidemia, CAD (history of non-STEMI in July 2019 with PCI of the RCA), and DM2. Last seen by cardiologist, Dr. Bartholome Bill MD with [facility] on 12/13/2018. EKG benign. Doing well from cardiac standpoint. Advised continuation of Metoprolol and Aspirin. Follow up scheduled for 6 months.  Patient with recent increase in atorvastatin dosage; from 40mg  qd to 80mg  qd in June 2020. Last week's labs compared to March 2020 labs reveal LDL from 158 to 195, total cholesterol from 177 to 162, triglycerides from 91 to 77, and HDL from 44 to 52.  Patient on Metformin 500mg  bid for DM2. A1C stable at 7.5. Patient states due to dental issue (in the process of being fit for implants), has been eating sweets (ice cream, pudding, cake). Patient lost significant amount of weight by exercising regularly at [facility]; however, has not been exercising currently due to knee pain and Covid19 restrictions.  Patient with bilateral knee arthritis, left knee worse than right. Has orthopedic consultation, with Dr. Juanito Doom MD on 03/17/2019 at [facility]. States he is interested in a left total knee replacement. In the meantime, will continue using knee brace, Voltaren gel, ice/heat, and OTC Tylenol.  Patient s/p radical prostatectomy in 2015 for treatment of prostate cancer. PSA has remained undetectable. Reports nocturia (every 2-3  hours) and urinary urgency. States he has not been seen by his urologist in a while.   Patient's primary complaint today is worsening insomnia. States he's had difficulty sleeping for many years. However, has significantly worsened since retiring. Patient states he is able to fall asleep, but cannot stay asleep. States he is averaging 5 hours of sleep per night. Will wake up; then scroll through his phone or watch tv. Will then fall asleep while watching tv. Reports worsening daytime somnolence. Will fall asleep/nod off while watching tv during the day. Has diagnosis of OSA and rx for CPAP machine. States he is not using.   A limited review of symptoms was performed, pertinent positives and negatives as mentioned in HPI.  The following portions of the patient's history were reviewed and updated as appropriate: allergies, current medications and past medical history.  Patient Active Problem List   Diagnosis Date Noted  . Diabetes mellitus ([place]) 09/09/2018  . Essential hypertension 09/09/2018  . Coronary artery disease involving coronary bypass graft of native heart without angina pectoris 09/09/2018  . Other hyperlipidemia 09/09/2018  . Impacted cerumen of left ear 09/09/2018  . Class 2 severe obesity due to excess calories with serious comorbidity and body mass index (BMI) of 39.0 to 39.9 in adult ([place]) 09/09/2018  . Non-STEMI (non-ST elevated myocardial infarction) ([place]) 08/19/2017  . 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 Malignant neoplasm of prostate ([place]) 04/28/2013  . Elevated prostate specific antigen (PSA) 02/21/2013  . Decreased libido 01/10/2013  . 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 Incomplete bladder emptying 01/10/2013    No Known Allergies  Current Outpatient Medications on File Prior to Visit  Medication Sig Dispense Refill  . aspirin 81 MG EC tablet Take by mouth.    Marland Kitchen atorvastatin (LIPITOR) 80 MG tablet Take 80 mg by mouth daily.    . metFORMIN  (GLUCOPHAGE) 500 MG tablet Take 1 tablet (500 mg total) by mouth 2 (two) times daily with a meal. 180 tablet 3  . metoprolol tartrate (LOPRESSOR) 25 MG tablet Take 1 tablet (25 mg total) by mouth 2 (two) times daily. 60 tablet 2  . nitroGLYCERIN (NITROSTAT) 0.4 MG SL tablet Place 1 tablet (0.4 mg total) under the tongue every 5 (five) minutes as needed for chest pain. 30 tablet 1  . Vitamin D, Ergocalciferol, (DRISDOL) 1.25 MG (50000 UNIT) CAPS capsule Take 50,000 Units by mouth every 7 (seven) days.    . clopidogrel (PLAVIX) 75 MG tablet TAKE 1 TABLET(75 MG) BY MOUTH EVERY DAY    . diclofenac Sodium (VOLTAREN) 1 % GEL Apply topically.    Marland Kitchen ibuprofen (ADVIL) 400 MG tablet TK 1 T PO TID PRN    . lovastatin (MEVACOR) 20 MG tablet Take by mouth.     No current facility-administered medications on file prior to visit.       Objective:   Vitals:   03/07/19 0812  BP: 140/80  Pulse: 95  Temp: 98.5 F (36.9 C)  SpO2: 98%     Wt Readings from Last 3 Encounters:  03/07/19 281 lb 3.2 oz (127.6 kg)  11/17/18 283 lb (128.4 kg)  09/09/18 286 lb (129.7 kg)    Physical Exam:   General Appearance:  Patient sitting comfortably on examination table. Conversational. Kermit Balo self-historian. In no acute distress. Afebrile.   Head:  Normocephalic, without obvious abnormality, atraumatic  Eyes:  PERRL, conjunctiva/corneas clear, EOM's intact  Ears:  Left ear canal WNL. No erythema or edema. No open wound. No visible purulent drainage. No tenderness with palpation over left tragus or with manipulation of left auricle. No visible erythema or edema of left mastoid. No tenderness with palpation over left mastoid. Right ear canal WNL. No erythema or edema. No open wound. No visible purulent drainage. No tenderness with palpation over right tragus or with manipulation of right auricle. No visible erythema or edema of right mastoid. No tenderness with palpation over right mastoid. Left TM WNL. Good light reflex.  Visible landmarks. No erythema. No injection. No bulging or retraction. No visible perforation. No serous effusion. No visible purulent effusion. No tympanostomy tube. No scar tissue. Right TM WNL. Good light reflex. Visible landmarks. No erythema. No injection. No bulging or retraction. No visible perforation. No serous effusion. No visible purulent effusion. No tympanostomy tube. No scar tissue.  Nose: Nares normal. Septum midline. No visible polyps. No discharge. Normal mucosa. No sinus tenderness with percussion/palpation.  Throat: Lips, mucosa, and tongue normal. Multiple teeth missing; preparation for dental implants. Throat reveals no erythema. No postnasal drip. No visible cobblestoning. Tonsils with no enlargement or exudate. Uvula midline with no edema or erythema.  Neck: Supple, symmetrical, trachea midline, no adenopathy  Lungs:   Clear to auscultation bilaterally, respirations unlabored. Good aeration. No rales, rhonchi, crackles or wheezing.  Heart:  Regular rate and rhythm, S1 and S2 normal, no murmur, rub, or gallop  Extremities: Extremities normal, atraumatic, no cyanosis or edema  Pulses: 2+ and symmetric  Skin: Skin color, texture, turgor normal, no rashes or lesions  Lymph nodes: Cervical, supraclavicular, and axillary nodes normal  Neurologic: Normal    Assessment & Plan:    Exam findings, diagnosis etiology and medication use and indications reviewed with patient. Follow-Up and discharge instructions provided. No emergent/urgent issues found on exam.  Patient education was provided.   Patient verbalized understanding of information provided and agrees with plan of care (POC), all questions answered. The patient is advised to call or return to clinic if condition does not see an improvement in symptoms, or to seek the care of the closest emergency department if condition worsens with the below plan.    1. Encounter for annual physical exam  2. Insomnia, unspecified  type  3. Obstructive sleep apnea  64 year old male presents to [facility] for annual examination.  HTN: 140/80 today. Well controlled on Metoprolol. Will continue current medication treatment. Discussed diet and exercise changes for improved control.  Hyperlipidemia: Labs WNL. Will continue Atorvastating 80mg  qd. Patient denies any side effects from increased dosage including myalgias.  CAD: Continued management by cardiology. Next appt in 6 months.  DM2: A1C 7.5 (x past year). Will continue Metformin 500mg  bid (BUN/Cr WNL). Discussed diet and exercise changes for improved control. Advised patient f/u with optometry/ophthalmology for eye check this year.  Dental implants: Scheduled in the near future.  Left knee pain/arthritis: Orthopedic appt next month to discuss left knee replacement.  Insomnia/OSA: Concern that insomnia is due to uncontrolled obstructive sleep apnea; in portion due to non-compliance with CPAP. Referral to sleep specialist.  [facility]  [address] [place], [place] [address]  F/U scheduled in 3-4 months to discuss health progress in regards to dentist/oral surgeon appt, orthopedist appt, and sleep specialist appt.   Darlin Priestly, MHS, PA-C Montey Hora, MHS, PA-C Advanced Practice Provider [facility]  [address], [place] [address] (p): [phone] [email or web address]

## 2019-03-10 ENCOUNTER — Telehealth: Payer: Self-pay | Admitting: General Practice

## 2019-03-10 NOTE — Telephone Encounter (Signed)
Called Daniel Bradley to see what medication he's talking about.  He said he thought PA Tobias Alexander was going to call a Rx in for him.  He said when he contacted his pharmacy no Rx were called in or sent to them.  They told him he still has a Rx in their system for Plavix.  Looked through [facility] encounter note for physical & no mention of restarting Plavix or adding any other medications.  Reviewed medication list with [facility] & what is in Epic is what he is currently taking.  Advised to contact his Cardiologist (Dr. Ubaldo Glassing at [facility]) before restarting Plavix.  Offered to send current labs to Dr. Ubaldo Glassing which Eduard Clos agreed for me to do.    AMD

## 2019-03-10 NOTE — Telephone Encounter (Signed)
Daniel Bradley he was supposed to have a Rx for his heart called in on Monday. I don't see anything and he wasn't sure of the name. He saw [name] on Monday 25th.

## 2019-03-17 DIAGNOSIS — M1712 Unilateral primary osteoarthritis, left knee: Secondary | ICD-10-CM | POA: Diagnosis not present

## 2019-03-20 DIAGNOSIS — M1990 Unspecified osteoarthritis, unspecified site: Secondary | ICD-10-CM | POA: Insufficient documentation

## 2019-03-20 DIAGNOSIS — M1712 Unilateral primary osteoarthritis, left knee: Secondary | ICD-10-CM | POA: Insufficient documentation

## 2019-04-03 ENCOUNTER — Emergency Department: Payer: 59

## 2019-04-03 ENCOUNTER — Other Ambulatory Visit: Payer: Self-pay

## 2019-04-03 ENCOUNTER — Emergency Department
Admission: EM | Admit: 2019-04-03 | Discharge: 2019-04-03 | Disposition: A | Discharge status: Home / Self Care | Payer: 59 | Attending: Emergency Medicine | Admitting: Emergency Medicine

## 2019-04-03 ENCOUNTER — Encounter: Payer: Self-pay | Admitting: Emergency Medicine

## 2019-04-03 DIAGNOSIS — R1030 Lower abdominal pain, unspecified: Secondary | ICD-10-CM | POA: Diagnosis present

## 2019-04-03 DIAGNOSIS — I252 Old myocardial infarction: Secondary | ICD-10-CM | POA: Diagnosis not present

## 2019-04-03 DIAGNOSIS — I2510 Atherosclerotic heart disease of native coronary artery without angina pectoris: Secondary | ICD-10-CM | POA: Insufficient documentation

## 2019-04-03 DIAGNOSIS — Z955 Presence of coronary angioplasty implant and graft: Secondary | ICD-10-CM | POA: Diagnosis not present

## 2019-04-03 DIAGNOSIS — R109 Unspecified abdominal pain: Secondary | ICD-10-CM | POA: Diagnosis not present

## 2019-04-03 DIAGNOSIS — R1110 Vomiting, unspecified: Secondary | ICD-10-CM | POA: Diagnosis not present

## 2019-04-03 DIAGNOSIS — E119 Type 2 diabetes mellitus without complications: Secondary | ICD-10-CM | POA: Diagnosis not present

## 2019-04-03 DIAGNOSIS — Z8546 Personal history of malignant neoplasm of prostate: Secondary | ICD-10-CM | POA: Diagnosis not present

## 2019-04-03 DIAGNOSIS — Z79899 Other long term (current) drug therapy: Secondary | ICD-10-CM | POA: Diagnosis not present

## 2019-04-03 DIAGNOSIS — N200 Calculus of kidney: Secondary | ICD-10-CM | POA: Insufficient documentation

## 2019-04-03 DIAGNOSIS — Z7982 Long term (current) use of aspirin: Secondary | ICD-10-CM | POA: Diagnosis not present

## 2019-04-03 DIAGNOSIS — I10 Essential (primary) hypertension: Secondary | ICD-10-CM | POA: Diagnosis not present

## 2019-04-03 DIAGNOSIS — Z7984 Long term (current) use of oral hypoglycemic drugs: Secondary | ICD-10-CM | POA: Diagnosis not present

## 2019-04-03 DIAGNOSIS — R197 Diarrhea, unspecified: Secondary | ICD-10-CM | POA: Diagnosis not present

## 2019-04-03 LAB — COMPREHENSIVE METABOLIC PANEL
ALT: 26 U/L (ref 0–44)
AST: 21 U/L (ref 15–41)
Albumin: 4.2 g/dL (ref 3.5–5.0)
Alkaline Phosphatase: 115 U/L (ref 38–126)
Anion gap: 9 (ref 5–15)
BUN: 16 mg/dL (ref 8–23)
CO2: 26 mmol/L (ref 22–32)
Calcium: 9.2 mg/dL (ref 8.9–10.3)
Chloride: 105 mmol/L (ref 98–111)
Creatinine, Ser: 1.05 mg/dL (ref 0.61–1.24)
GFR calc Af Amer: >60 mL/min (ref >60)
GFR calc non Af Amer: >60 mL/min (ref >60)
Glucose, Bld: 231 mg/dL — ABNORMAL HIGH (ref 70–99)
Potassium: 3.8 mmol/L (ref 3.5–5.1)
Sodium: 140 mmol/L (ref 135–145)
Total Bilirubin: 0.9 mg/dL (ref 0.3–1.2)
Total Protein: 7.5 g/dL (ref 6.5–8.1)

## 2019-04-03 LAB — URINALYSIS, ROUTINE W REFLEX MICROSCOPIC
Bacteria, UA: NONE SEEN
Bilirubin Urine: NEGATIVE
Glucose, UA: 150 mg/dL — AB
Ketones, ur: NEGATIVE mg/dL
Leukocytes,Ua: NEGATIVE
Nitrite: NEGATIVE
Protein, ur: NEGATIVE mg/dL
RBC / HPF: >50 RBC/hpf — ABNORMAL HIGH (ref 0–5)
Specific Gravity, Urine: 1.025 (ref 1.005–1.030)
pH: 5 (ref 5.0–8.0)

## 2019-04-03 LAB — CBC WITH DIFFERENTIAL/PLATELET
Abs Immature Granulocytes: 0.03 10*3/uL (ref 0.00–0.07)
Basophils Absolute: 0.1 10*3/uL (ref 0.0–0.1)
Basophils Relative: 1 %
Eosinophils Absolute: 0.2 10*3/uL (ref 0.0–0.5)
Eosinophils Relative: 2 %
HCT: 42.1 % (ref 39.0–52.0)
Hemoglobin: 13.1 g/dL (ref 13.0–17.0)
Immature Granulocytes: 0 %
Lymphocytes Relative: 17 %
Lymphs Abs: 1.6 10*3/uL (ref 0.7–4.0)
MCH: 25 pg — ABNORMAL LOW (ref 26.0–34.0)
MCHC: 31.1 g/dL (ref 30.0–36.0)
MCV: 80.3 fL (ref 80.0–100.0)
Monocytes Absolute: 0.5 10*3/uL (ref 0.1–1.0)
Monocytes Relative: 6 %
Neutro Abs: 6.7 10*3/uL (ref 1.7–7.7)
Neutrophils Relative %: 74 %
Platelets: 275 10*3/uL (ref 150–400)
RBC: 5.24 MIL/uL (ref 4.22–5.81)
RDW: 14.6 % (ref 11.5–15.5)
WBC: 9.1 10*3/uL (ref 4.0–10.5)
nRBC: 0 % (ref 0.0–0.2)

## 2019-04-03 LAB — LIPASE, BLOOD: Lipase: 26 U/L (ref 11–51)

## 2019-04-03 MED ORDER — HYDROMORPHONE HCL 1 MG/ML IJ SOLN
0.5000 mg | Freq: Once | INTRAMUSCULAR | Status: AC
  Administered 2019-04-03: 0.5 mg via INTRAVENOUS
  Filled 2019-04-03: qty 1

## 2019-04-03 MED ORDER — IOHEXOL 300 MG/ML  SOLN
100.0000 mL | Freq: Once | INTRAMUSCULAR | Status: AC | PRN
  Administered 2019-04-03: 100 mL via INTRAVENOUS

## 2019-04-03 MED ORDER — ONDANSETRON HCL 4 MG/2ML IJ SOLN
4.0000 mg | Freq: Once | INTRAMUSCULAR | Status: AC
  Administered 2019-04-03: 4 mg via INTRAVENOUS
  Filled 2019-04-03: qty 2

## 2019-04-03 MED ORDER — SODIUM CHLORIDE 0.9 % IV BOLUS
1000.0000 mL | Freq: Once | INTRAVENOUS | Status: AC
  Administered 2019-04-03: 1000 mL via INTRAVENOUS

## 2019-04-03 MED ORDER — HYDROMORPHONE HCL 1 MG/ML IJ SOLN
1.0000 mg | Freq: Once | INTRAMUSCULAR | Status: AC
  Administered 2019-04-03: 1 mg via INTRAVENOUS
  Filled 2019-04-03: qty 1

## 2019-04-03 MED ORDER — ONDANSETRON 4 MG PO TBDP: 4.0000 mg | ORAL_TABLET | Freq: Three times a day (TID) | ORAL | 0 refills | Status: AC | PRN

## 2019-04-03 MED ORDER — OXYCODONE HCL 5 MG PO TABS: 5.0000 mg | ORAL_TABLET | Freq: Three times a day (TID) | ORAL | 0 refills | Status: AC | PRN

## 2019-04-03 NOTE — ED Provider Notes (Signed)
[facility] Emergency Department Provider Note  ____________________________________________   First MD Initiated Contact with Patient 04/03/19 [phone]     (approximate)  I have reviewed the triage vital signs and the nursing notes.   HISTORY  Chief Complaint Abdominal Pain and Emesis    HPI Daniel Bradley. is a 64 y.o. male with diabetes, hyperlipidemia, history of prostate cancer who comes in with abdominal pain.  Patient states that he had his prostate removed and since then has not had any issues.  Never needed chemotherapy or radiation.  He is in remission.  Patient states that he came in today for lower abdominal pain that was associated with multiple episodes of nonbloody nonbilious vomiting as well as a few episodes of liquid stools.  Denies any loss feeling sick at home.  Denies any changes in his diet yesterday.  States that he has had a kidney stone before and wonders if this is similar.  His pain is severe, all the lower abdomen, nonradiating, has not take anything to help the pain, nothing makes it worse.  No other abdominal surgeries.  No fevers.  Patient did not have a catheter from his prostate removal.  States he has been urinating normally.          Past Medical History:  Diagnosis Date  . Arthritis of knee   . 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 Decreased libido 01/10/2013  . DM (diabetes mellitus) ([place])   . 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 Elevated prostate specific antigen (PSA) 02/21/2013  . Hyperlipidemia   . Incomplete bladder emptying 01/10/2013  . Malignant neoplasm of prostate ([place]) 04/28/2013  . Obstructive sleep apnea on CPAP     Patient Active Problem List   Diagnosis Date Noted  . Diabetes mellitus ([place]) 09/09/2018  . Essential hypertension 09/09/2018  . Coronary artery disease involving coronary bypass graft of native heart without angina pectoris 09/09/2018  . Other hyperlipidemia  09/09/2018  . Impacted cerumen of left ear 09/09/2018  . Class 2 severe obesity due to excess calories with serious comorbidity and body mass index (BMI) of 39.0 to 39.9 in adult ([place]) 09/09/2018  . Non-STEMI (non-ST elevated myocardial infarction) ([place]) 08/19/2017  . 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 Malignant neoplasm of prostate ([place]) 04/28/2013  . Elevated prostate specific antigen (PSA) 02/21/2013  . Decreased libido 01/10/2013  . 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 Incomplete bladder emptying 01/10/2013    Past Surgical History:  Procedure Laterality Date  . COLONOSCOPY    . CORONARY STENT INTERVENTION N/A 08/20/2017   Procedure: CORONARY STENT INTERVENTION;  Surgeon: Yolonda Kida, MD;  Location: [place] [facility];  Service: Cardiovascular;  Laterality: N/A;  . LEFT HEART CATH AND CORONARY ANGIOGRAPHY N/A 08/20/2017   Procedure: LEFT HEART CATH AND CORONARY ANGIOGRAPHY;  Surgeon: Teodoro Spray, MD;  Location: [place] [facility];  Service: Cardiovascular;  Laterality: N/A;  . PROSTATE SURGERY  07/14/2013   Prostatectomy    Prior to Admission medications   Medication Sig Start Date End Date Taking? Authorizing Provider  aspirin 81 MG EC tablet Take by mouth.    [provider]  atorvastatin (LIPITOR) 80 MG tablet Take 80 mg by mouth daily.    [provider]  diclofenac Sodium (VOLTAREN) 1 % GEL Apply topically. 06/07/18 06/07/19  [provider]  metFORMIN (GLUCOPHAGE) 500 MG tablet Take 1 tablet (500 mg total) by mouth 2 (two) times daily  with a meal. 10/04/18   Betancourt, Aura Fey, NP  metoprolol tartrate (LOPRESSOR) 25 MG tablet Take 1 tablet (25 mg total) by mouth 2 (two) times daily. 08/21/17   Gladstone Lighter, MD  nitroGLYCERIN (NITROSTAT) 0.4 MG SL tablet Place 1 tablet (0.4 mg total) under the tongue every 5 (five) minutes as needed for chest pain. 08/21/17   Gladstone Lighter, MD  Vitamin D, Ergocalciferol,  (DRISDOL) 1.25 MG (50000 UNIT) CAPS capsule Take 50,000 Units by mouth every 7 (seven) days.    [provider]    Allergies Patient has no known allergies.  Family History  Problem Relation Age of Onset  . Pancreatic cancer Brother   . Alcoholism Mother   . Alcoholism Father   . Prostate cancer Neg Hx   . Kidney disease Neg Hx     Social History Social History   Tobacco Use  . Smoking status: Never Smoker  . Smokeless tobacco: Never Used  Substance Use Topics  . Alcohol use: No    Alcohol/week: 0.0 standard drinks  . Drug use: No      Review of Systems Constitutional: No fever/chills Eyes: No visual changes. ENT: No sore throat. Cardiovascular: Denies chest pain. Respiratory: Denies shortness of breath. Gastrointestinal: Positive abdominal pain, vomiting, diarrhea Genitourinary: Negative for dysuria. Musculoskeletal: Negative for back pain. Skin: Negative for rash. Neurological: Negative for headaches, focal weakness or numbness. All other ROS negative ____________________________________________   PHYSICAL EXAM:  VITAL SIGNS: ED Triage Vitals  Enc Vitals Group     BP 04/03/19 0736 (!) 147/86     Pulse Rate 04/03/19 0736 72     Resp 04/03/19 0736 16     Temp 04/03/19 0736 97.8 F (36.6 C)     Temp Source 04/03/19 0736 Oral     SpO2 04/03/19 0736 98 %     Weight 04/03/19 0734 275 lb (124.7 kg)     Height 04/03/19 0734 6\' 1"  (1.854 m)     Head Circumference --      Peak Flow --      Pain Score 04/03/19 0733 10     Pain Loc --      Pain Edu? --      Excl. in [place]? --     Constitutional: Alert and oriented. Well appearing and in no acute distress. Eyes: Conjunctivae are normal. EOMI. Head: Atraumatic. Nose: No congestion/rhinnorhea. Mouth/Throat: Mucous membranes are moist.   Neck: No stridor. Trachea Midline. FROM Cardiovascular: Normal rate, regular rhythm. Grossly normal heart sounds.  Good peripheral circulation. Respiratory: Normal  respiratory effort.  No retractions. Lungs CTAB. Gastrointestinal: Soft and nontender with palpation although patient reports a lower abdominal pain that is just there constantly.. No distention. No abdominal bruits.  Musculoskeletal: No lower extremity tenderness nor edema.  No joint effusions. Neurologic:  Normal speech and language. No gross focal neurologic deficits are appreciated.  Skin:  Skin is warm, dry and intact. No rash noted. Psychiatric: Mood and affect are normal. Speech and behavior are normal. GU: Deferred   ____________________________________________   LABS (all labs ordered are listed, but only abnormal results are displayed)  Labs Reviewed  CBC WITH DIFFERENTIAL/PLATELET - Abnormal; Notable for the following components:      Result Value   MCH 25.0 (*)    All other components within normal limits  COMPREHENSIVE METABOLIC PANEL - Abnormal; Notable for the following components:   Glucose, Bld 231 (*)    All other components within normal limits  URINALYSIS, ROUTINE  W REFLEX MICROSCOPIC - Abnormal; Notable for the following components:   Color, Urine YELLOW (*)    APPearance CLEAR (*)    Glucose, UA 150 (*)    Hgb urine dipstick LARGE (*)    RBC / HPF >50 (*)    All other components within normal limits  URINE CULTURE  GI PATHOGEN PANEL BY PCR, STOOL  C DIFFICILE QUICK SCREEN W PCR REFLEX  LIPASE, BLOOD   ____________________________________________   ED ECG REPORT I, Vanessa [place], the attending physician, personally viewed and interpreted this ECG.  EKG is normal sinus rate of 81, no ST elevation, no T wave inversions, normal intervals ____________________________________________  RADIOLOGY Robert Bellow, personally viewed and evaluated these images (plain radiographs) as part of my medical decision making, as well as reviewing the written report by the radiologist.  ED MD interpretation: Kidney stone noted to be in the bladder  Official radiology  report(s): CT ABDOMEN PELVIS W CONTRAST  Result Date: 04/03/2019 CLINICAL DATA:  Abdominal distention with lower abdominal pain EXAM: CT ABDOMEN AND PELVIS WITH CONTRAST TECHNIQUE: Multidetector CT imaging of the abdomen and pelvis was performed using the standard protocol following bolus administration of intravenous contrast. CONTRAST:  176mL OMNIPAQUE IOHEXOL 300 MG/ML  SOLN COMPARISON:  Abdominal CT 04/21/2012 FINDINGS: Lower chest:  Coronary atherosclerosis.  Mild atelectasis Hepatobiliary: No focal liver abnormality.No evidence of biliary obstruction or stone. Pancreas: Suspect a splenule at the tail the pancreas. Spleen: Unremarkable. Adrenals/Urinary Tract: Negative adrenals. Asymmetric left renal expansion, perinephric stranding, and ureteral thickening with periureteric edema. No hydronephrosis. There is a 4 mm stone layering in the bladder. Bilateral intrarenal calculi, at least 4 on the left and 3 on the right. The larges is t on the right and measures up to 7 mm. Small cystic density in the left renal cortex. There is a soft tissue nodule with calcification at the dome of the bladder measuring 16 mm, new from 2014. Stomach/Bowel: No obstruction. No evidence of bowel inflammation. Mild colonic diverticulosis. Vascular/Lymphatic: No acute vascular abnormality. No mass or adenopathy. Reproductive:Prostatectomy. No worrisome findings in the operative bed. Other: No ascites or pneumoperitoneum. Musculoskeletal: No acute abnormalities. Spinal degeneration. No sclerotic bone lesion. IMPRESSION: 1. Left-sided obstructive/inflammatory changes likely related to recent passage of a 4 mm stone which is layering in the bladder. 2. 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 Recommend urology follow-up. 3. Bilateral nephrolithiasis. Electronically Signed   By: Monte Fantasia M.D.   On: 04/03/2019 10:06     ____________________________________________   PROCEDURES  Procedure(s) performed (including Critical Care):  Procedures   ____________________________________________   INITIAL IMPRESSION / ASSESSMENT AND PLAN / ED COURSE  Daniel Jaylynn Holme. was evaluated in Emergency Department on 04/03/2019 for the symptoms described in the history of present illness. He was evaluated in the context of the global COVID-19 pandemic, which necessitated consideration that the patient might be at risk for infection with the SARS-CoV-2 virus that causes COVID-19. Institutional protocols and algorithms that pertain to the evaluation of patients at risk for COVID-19 are in a state of rapid change based on information released by regulatory bodies including the CDC and federal and state organizations. These policies and algorithms were followed during the patient's care in the ED.    Patient is a well-appearing 63 year old with normal vital signs except for some mild hypertension who comes in with lower abdominal pain diarrhea and vomiting.  Given patient's discomfort will get CT abdomen to make sure no evidence of SBO, perforation, diverticulitis, kidney stone.  No upper abdominal pain to suggest cholecystitis pancreatitis ACS.  Will get EKG but again low suspicion for cardiac in nature.  Denies any shortness of breath to suggest PE or pulmonary source of pain.  Denies any recent antibiotics to suggest C. difficile but can send stools for testing if he is able to give one.  Labs are reassuring.  UA consistent with concern for possible kidney stone with greater than 50 RBCs.   CT scan does show some inflammatory changes due to recently passed 4 mm stone.  Patient also have an incidentally found concern for tumor.  Discussed this result with patient and given a copy of the report as well as urology's name for him to call make an appointment.  Patient expressed understanding.  Stated that most of his patient I  have resided.  Given he may have a little discomfort with passing out the urethra we gave him a few oxycodone and Zofran.  Patient felt comfortable with this plan and will follow up with urology outpatient  I discussed the provisional nature of ED diagnosis, the treatment so far, the ongoing plan of care, follow up appointments and return precautions with the patient and any family or support people present. They expressed understanding and agreed with the plan, discharged home.   ____________________________________________   FINAL CLINICAL IMPRESSION(S) / ED DIAGNOSES   Final diagnoses:  Kidney stone      MEDICATIONS GIVEN DURING THIS VISIT:  Medications  sodium chloride 0.9 % bolus 1,000 mL (1,000 mLs Intravenous New Bag/Given 04/03/19 0814)  ondansetron (ZOFRAN) injection 4 mg (4 mg Intravenous Given 04/03/19 0817)  HYDROmorphone (DILAUDID) injection 0.5 mg (0.5 mg Intravenous Given 04/03/19 0817)  HYDROmorphone (DILAUDID) injection 1 mg (1 mg Intravenous Given 04/03/19 0913)  iohexol (OMNIPAQUE) 300 MG/ML solution 100 mL (100 mLs Intravenous Contrast Given 04/03/19 0933)     ED Discharge Orders         Ordered    ondansetron (ZOFRAN ODT) 4 MG disintegrating tablet  Every 8 hours PRN     04/03/19 1037    oxyCODONE (ROXICODONE) 5 MG immediate release tablet  Every 8 hours PRN     04/03/19 1037           Note:  This document was prepared using Dragon voice recognition software and may include unintentional dictation errors.   Vanessa [place], MD 04/03/19 1053

## 2019-04-03 NOTE — ED Triage Notes (Signed)
Pt to ED via POV c/o abdominal pain, diarrhea, and vomiting that started this morning around 0500. Pt is located him is lower abdomen. Pt has not tried any treatment at home. Pt is in NAD.

## 2019-04-03 NOTE — Discharge Instructions (Addendum)
Most of your pain should now be relieved given the kidney stone has passed into your bladder.  However if you do have a little bit of discomfort you can take oxycodone for breakthrough pain.  Do not drive and be careful with ambulating to see you do not have a fall.  You take Zofran to help with nausea.  You should follow-up with urology for the CT findings below.     IMPRESSION:  1. Left-sided obstructive/inflammatory changes likely related to  recent passage of a 4 mm stone which is layering in the bladder.  2. 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 Recommend  urology follow-up.  3. Bilateral nephrolithiasis.

## 2019-04-04 LAB — URINE CULTURE: Culture: NO GROWTH

## 2019-04-05 ENCOUNTER — Encounter: Payer: Self-pay | Admitting: General Practice

## 2019-04-05 ENCOUNTER — Telehealth: Payer: Self-pay | Admitting: General Practice

## 2019-04-05 DIAGNOSIS — N200 Calculus of kidney: Secondary | ICD-10-CM

## 2019-04-05 NOTE — Telephone Encounter (Signed)
Noted follow up appt scheduled with urology and patient voiding without difficulty or changes in urine color at this time.

## 2019-04-05 NOTE — Telephone Encounter (Signed)
Adrenals/Urinary Tract: Negative adrenals. Asymmetric left renal expansion, perinephric stranding, and ureteral thickening with periureteric edema. No hydronephrosis. There is a 4 mm stone layering in the bladder. Bilateral intrarenal calculi, at least 4 on the left and 3 on the right. The larges is t on the right and measures up to 7 mm. Small cystic density in the left renal cortex. There is a soft tissue nodule with calcification at the dome of the bladder measuring 16 mm, new from 2014. Stomach/Bowel: No obstruction. No evidence of bowel inflammation. Mild colonic diverticulosis. Vascular/Lymphatic: No acute vascular abnormality. No mass or adenopathy. Reproductive:Prostatectomy. No worrisome findings in the operative bed. Other: No ascites or pneumoperitoneum. Musculoskeletal: No acute abnormalities. Spinal degeneration. No sclerotic bone lesion. IMPRESSION: 1. Left-sided obstructive/inflammatory changes likely related to recent passage of a 4 mm stone which is layering in the bladder. 2. 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 Recommend urology follow-up. 3. Bilateral nephrolithiasis. Electronically Signed   By: Monte Fantasia M.D.   On: 04/03/2019 10:06  new urology referral entered for patient.  Please contact their office to ensure they are using Epic or if it needs to be printed/faxed.  Please follow up with patient to ensure he is able to void every 8 hours and no tea/cola colored urine/worsening symptoms and tolerating po intake without difficulty otherwise needs follow up appt.

## 2019-04-05 NOTE — Telephone Encounter (Signed)
Was treated in the ED for kidney stones on 04-03-19. He was told to follow up with his urologist. However his urologist said he had to have a referral from [place] first. He hasn't seen them since 2018. Erin Sons, MD is his urologist from [facility]. Does he need to come in for a visit or can we just do the referral?

## 2019-04-05 NOTE — Telephone Encounter (Signed)
Contacted Daniel Bradley & he said the Urology office has contacted him & scheduled an appointment.  Reminded him to increase water/fluid intake & watch the color of the urine & make sure he's voiding at least every 8 hours.  Verbalized understanding & thanked [place] for helping him with appointment.  AMD

## 2019-04-07 ENCOUNTER — Telehealth: Payer: Self-pay

## 2019-04-07 NOTE — Telephone Encounter (Signed)
Received [facility] ED Notes dated 04/03/19 & reviewed by Gerarda Fraction, NP-C (Interim Provider). "Reviewed 12/12 pages.  Urology Referral place on 04/05/19 & appt scheduled per pt.  See tcon Epic.  CT mild colonic diverticularlosis; 4 mm stone in bladder & Left sideed obstructive inflammatory changes; 16 mm tissue nodule calcification bladder dome new ? urachalnemnant tumor; Bilateral nephrolithiasisi."   AMD

## 2019-04-25 NOTE — Telephone Encounter (Signed)
Epic reports urology appt 05/03/2019

## 2019-05-02 NOTE — Progress Notes (Incomplete)
05/03/19 3:40 PM   Daniel Bradley. 03-Dec-1955 [id number]  Referring provider: Shepard General, MD [place] [address] [place],  [place] [address]  No chief complaint on file.   HPI: Daniel Bradley. is a 64 y.o. African American M with PMH of gleason 3+4=7 prostate cancer treated with radical prostatectomy in 2015 presents today following an ED visit for the evaluation and management of kidney stones.   He presented to ED on 2//21/20 for severe lower abdominal pain, non-radiating with associated symptoms of emesis. His UA during visit indicated 150 glucose, large heme on dip, and >50 RBC with negative culture.   He was followed up with a CT scan which indicated left-sided obstructive/inflammatory changes related to recent passage of a 4 mm stone in bladder. 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 Bilateral nephrolithiasis noted.       1. ***  *** 2. *** *** 3. *** ***       PMH: Past Medical History:  Diagnosis Date  . Arthritis of knee   . 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 Decreased libido 01/10/2013  . DM (diabetes mellitus) ([place])   . 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 Elevated prostate specific antigen (PSA) 02/21/2013  . Hyperlipidemia   . Incomplete bladder emptying 01/10/2013  . Malignant neoplasm of prostate ([place]) 04/28/2013  . Obstructive sleep apnea on CPAP     Surgical History: Past Surgical History:  Procedure Laterality Date  . COLONOSCOPY    . CORONARY STENT INTERVENTION N/A 08/20/2017   Procedure: CORONARY STENT INTERVENTION;  Surgeon: Yolonda Kida, MD;  Location: [place] [facility];  Service: Cardiovascular;  Laterality: N/A;  . LEFT HEART CATH AND CORONARY ANGIOGRAPHY N/A 08/20/2017   Procedure: LEFT HEART CATH AND CORONARY ANGIOGRAPHY;  Surgeon: Teodoro Spray, MD;  Location: [place] [facility]  [facility];  Service: Cardiovascular;  Laterality: N/A;  . PROSTATE SURGERY  07/14/2013   Prostatectomy    Home Medications:  Allergies as of 05/03/2019   No Known Allergies     Medication List       Accurate as of May 02, 2019  3:40 PM. If you have any questions, ask your nurse or doctor.        aspirin 81 MG EC tablet Take by mouth.   atorvastatin 80 MG tablet Commonly known as: LIPITOR Take 80 mg by mouth daily.   diclofenac Sodium 1 % Gel Commonly known as: VOLTAREN Apply topically.   metFORMIN 500 MG tablet Commonly known as: GLUCOPHAGE Take 1 tablet (500 mg total) by mouth 2 (two) times daily with a meal.   metoprolol tartrate 25 MG tablet Commonly known as: LOPRESSOR Take 1 tablet (25 mg total) by mouth 2 (two) times daily.   nitroGLYCERIN 0.4 MG SL tablet Commonly known as: NITROSTAT Place 1 tablet (0.4 mg total) under the tongue every 5 (five) minutes as needed for chest pain.   Vitamin D (Ergocalciferol) 1.25 MG (50000 UNIT) Caps capsule Commonly known as: DRISDOL Take 50,000 Units by mouth every 7 (seven) days.       Allergies: No Known Allergies  Family History: Family History  Problem Relation Age of Onset  . Pancreatic cancer Brother   . Alcoholism Mother   . Alcoholism Father   . Prostate cancer Neg Hx   . Kidney disease Neg Hx     Social History:  reports that he has never smoked. He has never used smokeless tobacco. He reports that he does not drink alcohol or use drugs.   Physical Exam: There were no vitals taken for this visit.  Constitutional:  Alert and oriented, No acute distress. HEENT: [place] AT, moist mucus membranes.  Trachea midline, no masses. Cardiovascular: No clubbing, cyanosis, or edema. Respiratory: Normal respiratory effort, no increased work of breathing. GI: Abdomen is soft, nontender, nondistended, no abdominal masses GU: No CVA tenderness Lymph: No cervical or inguinal lymphadenopathy. Skin: No rashes, bruises or  suspicious lesions. Neurologic: Grossly intact, no focal deficits, moving all 4 extremities. Psychiatric: Normal mood and affect.  Laboratory Data: Lab Results  Component Value Date   WBC 9.1 04/03/2019   HGB 13.1 04/03/2019   HCT 42.1 04/03/2019   MCV 80.3 04/03/2019   PLT 275 04/03/2019    Lab Results  Component Value Date   CREATININE 1.05 04/03/2019    Lab Results  Component Value Date   HGBA1C 7.5 (H) 02/22/2019    Urinalysis  Pertinent Imaging: CLINICAL DATA:  Abdominal distention with lower abdominal pain  EXAM: CT ABDOMEN AND PELVIS WITH CONTRAST  TECHNIQUE: Multidetector CT imaging of the abdomen and pelvis was performed using the standard protocol following bolus administration of intravenous contrast.  CONTRAST:  144mL OMNIPAQUE IOHEXOL 300 MG/ML  SOLN  COMPARISON:  Abdominal CT 04/21/2012  FINDINGS: Lower chest:  Coronary atherosclerosis.  Mild atelectasis  Hepatobiliary: No focal liver abnormality.No evidence of biliary obstruction or stone.  Pancreas: Suspect a splenule at the tail the pancreas.  Spleen: Unremarkable.  Adrenals/Urinary Tract: Negative adrenals. Asymmetric left renal expansion, perinephric stranding, and ureteral thickening with periureteric edema. No hydronephrosis. There is a 4 mm stone layering in the bladder. Bilateral intrarenal calculi, at least 4 on the left and 3 on the right. The larges is t on the right and measures up to 7 mm. Small cystic density in the left renal cortex. There is a soft tissue nodule with calcification at the dome of the bladder measuring 16 mm, new from 2014.  Stomach/Bowel: No obstruction. No evidence of bowel inflammation. Mild colonic diverticulosis.  Vascular/Lymphatic: No acute vascular abnormality. No mass or adenopathy.  Reproductive:Prostatectomy. No worrisome findings in the operative bed.  Other: No ascites or pneumoperitoneum.  Musculoskeletal: No acute  abnormalities. Spinal degeneration. No sclerotic bone lesion.  IMPRESSION: 1. Left-sided obstructive/inflammatory changes likely related to recent passage of a 4 mm stone which is layering in the bladder. 2. 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 Recommend urology follow-up. 3. Bilateral nephrolithiasis.   Electronically Signed   By: Monte Fantasia M.D.   On: 04/03/2019 10:06  I have personally reviewed the images and agree with radiologist interpretation.   Assessment & Plan:    @DIAGMED @  No follow-ups on file.  [facility] [address] [place], [place] [address] [phone]  I, Lucas Mallow, am acting as a scribe for Dr. Hollice Espy,  {Add Scribe Attestation Statement}

## 2019-05-03 ENCOUNTER — Encounter: Payer: Self-pay | Admitting: Urology

## 2019-05-03 ENCOUNTER — Telehealth: Payer: Self-pay | Admitting: Urology

## 2019-05-03 ENCOUNTER — Ambulatory Visit: Payer: BLUE CROSS/BLUE SHIELD | Admitting: Urology

## 2019-05-03 NOTE — Telephone Encounter (Signed)
This patient was a no-show.  He has a mass in his bladder that needs to be evaluated as soon as possible.  Please call him and reschedule at his earliest convenience.  Hollice Espy, MD

## 2019-05-13 ENCOUNTER — Other Ambulatory Visit: Payer: Self-pay

## 2019-05-13 ENCOUNTER — Encounter
Admission: RE | Admit: 2019-05-13 | Discharge: 2019-05-13 | Disposition: A | Discharge status: Home / Self Care | Payer: 59 | Source: Ambulatory Visit | Attending: Orthopedic Surgery | Admitting: Orthopedic Surgery

## 2019-05-13 DIAGNOSIS — Z01818 Encounter for other preprocedural examination: Secondary | ICD-10-CM | POA: Insufficient documentation

## 2019-05-13 HISTORY — DX: Personal history of urinary calculi: Z87.442

## 2019-05-13 HISTORY — DX: Depression, unspecified: F32.A

## 2019-05-13 HISTORY — DX: Essential (primary) hypertension: I10

## 2019-05-13 HISTORY — DX: Atherosclerotic heart disease of native coronary artery without angina pectoris: I25.10

## 2019-05-13 HISTORY — DX: Unspecified hemorrhoids: K64.9

## 2019-05-13 NOTE — Patient Instructions (Signed)
Your procedure is scheduled [id number] 4/12  Report to [facility] To find out your arrival time please call [phone] between 1PM - 3PM on Friday 4/9.  Remember: Instructions that are not followed completely may result in serious medical risk,  up to and including death, or upon the discretion of your surgeon and anesthesiologist your  surgery may need to be rescheduled.     _X__ 1. Do not eat food after midnight the night before your procedure.                 No gum chewing or hard candies. You may drink clear liquids up to 2 hours                 before you are scheduled to arrive for your surgery- DO not drink clear                 liquids within 2 hours of the start of your surgery.                 Clear Liquids include:  water,  G2 or                  Gatorade Zero (avoid Red/Purple/Blue), Black Coffee or Tea (Do not add                 anything to coffee or tea). ___x__2.   Complete the carbohydrate drink provided to you, 2 hours before arrival.  G2  __X__2.  On the morning of surgery brush your teeth with toothpaste and water, you                may rinse your mouth with mouthwash if you wish.  Do not swallow any toothpaste of mouthwash.     ___ 3.  No Alcohol for 24 hours before or after surgery.   ___ 4.  Do Not Smoke or use e-cigarettes For 24 Hours Prior to Your Surgery.                 Do not use any chewable tobacco products for at least 6 hours prior to                 surgery.  ____  5.  Bring all medications with you on the day of surgery if instructed.   __x__  6.  Notify your doctor if there is any change in your medical condition      (cold, fever, infections).     Do not wear jewelry, Do not wear lotions, powders, or perfumes. You may wear deodorant. Do not shave 48 hours prior to surgery. Men may shave face and neck. Do not bring valuables to the hospital.    [facility] is not responsible for any belongings or  valuables.  Contacts, dentures or bridgework may not be worn into surgery. Leave your suitcase in the car. After surgery it may be brought to your room. For patients admitted to the hospital, discharge time is determined by your treatment team.   Patients discharged the day of surgery will not be allowed to drive home.   Make arrangements for someone to be with you for the first 24 hours of your Same Day Discharge.    Please read over the following fact sheets that you were given:    __x__ Take these medicines the morning of surgery with A SIP OF WATER:    1. atorvastatin (LIPITOR) 80 MG tablet  2. metoprolol tartrate (LOPRESSOR) 25 MG tablet  3.   4.  5.  6.  ____ Fleet Enema (as directed)   __x__ Use CHG Soap (or wipes) as directed  ____ Use Benzoyl Peroxide Gel as instructed  ____ Use inhalers on the day of surgery  _x___ Stop metformin 2 days prior to surgery  Last dose on Friday  4/9    ____ Take 1/2 of usual insulin dose the night before surgery. No insulin the morning          of surgery.   __x__ Stop aspirin and BC powders on  Monday 4/5  _x___ Stop Anti-inflammatories  No ibuprofen or aleve after 4/5    May take tylenol   ____ Stop supplements until after surgery.    ____ Bring C-Pap to the hospital.

## 2019-05-13 NOTE — Discharge Instructions (Signed)
Instructions after Total Knee Replacement   Daniel Bradley P. [name], Jr., M.D.     Dept. of Orthopaedics & Sports Medicine  [facility]  [address]  [place], [place]  [address]  Phone: [phone]   Fax: [phone]    DIET: Drink plenty of non-alcoholic fluids. Resume your normal diet. Include foods high in fiber.  ACTIVITY:  You may use crutches or a walker with weight-bearing as tolerated, unless instructed otherwise. You may be weaned off of the walker or crutches by your Physical Therapist.  Do NOT place pillows under the knee. Anything placed under the knee could limit your ability to straighten the knee.   Continue doing gentle exercises. Exercising will reduce the pain and swelling, increase motion, and prevent muscle weakness.   Please continue to use the TED compression stockings for 6 weeks. You may remove the stockings at night, but should reapply them in the morning. Do not drive or operate any equipment until instructed.  WOUND CARE:  Continue to use the PolarCare or ice packs periodically to reduce pain and swelling. You may bathe or shower after the staples are removed at the first office visit following surgery.  MEDICATIONS: You may resume your regular medications. Please take the pain medication as prescribed on the medication. Do not take pain medication on an empty stomach. You have been given a prescription for a blood thinner (Lovenox or Coumadin). Please take the medication as instructed. (NOTE: After completing a 2 week course of Lovenox, take one Enteric-coated aspirin once a day. This along with elevation will help reduce the possibility of phlebitis in your operated leg.) Do not drive or drink alcoholic beverages when taking pain medications.  CALL THE OFFICE FOR: Temperature above 101 degrees Excessive bleeding or drainage on the dressing. Excessive swelling, coldness, or paleness of the toes. Persistent nausea and vomiting.  FOLLOW-UP:  You  should have an appointment to return to the office in 10-14 days after surgery. Arrangements have been made for continuation of Physical Therapy (either home therapy or outpatient therapy).   [facility] Department Directory         [email or web address]       [email or web address]          Cardiology  Appointments: [place] - [phone] [place] - [phone]  Endocrinology  Appointments: [place] - [phone] [place] - [phone]  Gastroenterology  Appointments: [place] - [phone] [place] - [phone]        General Surgery   Appointments: [place] - [phone]  Internal Medicine/Family Medicine  Appointments: [place] - [phone] [place] - [phone] [place] - [phone]  Metabolic and Weigh Loss Surgery  Appointments: [place] - [phone]        Neurology  Appointments: [place] - [phone] [place] - [phone]  Neurosurgery  Appointments: [place] - [phone]  Obstetrics & Gynecology  Appointments: [place] - [phone] [place] - [phone]        Pediatrics  Appointments: [place] - [phone] [place] - [phone]  Physiatry  Appointments: [place] -[phone]  Physical Therapy  Appointments: [place] - [phone] [place] - [phone]        Podiatry  Appointments: [place] - [phone] [place] - [phone]  Pulmonology  Appointments: [place] - [phone]  Rheumatology  Appointments: [place] - [phone]        [place] Location: [facility]  [address] , [place]  [address]  [place] Location: [facility] [address] [place], [place]  [address]  [place] Location: [facility] [address] [place], [place]  [address]    

## 2019-05-16 ENCOUNTER — Inpatient Hospital Stay: Admission: RE | Admit: 2019-05-16 | Payer: 59 | Source: Ambulatory Visit

## 2019-05-16 NOTE — Progress Notes (Signed)
05/17/19 2:33 PM   Daniel Leyland Jr. 1955/05/27 [id number]  Referring provider: No referring provider defined for this encounter.  Chief Complaint  Patient presents with  . Nephrolithiasis    HPI: Daniel Bradley. is a 64 y.o. M with PMHx of gleason 3+4 prostate cancer s/p radical prostatectomy in 2015 presents today after ED f/u for the evaluation and management of kidney stones and bladder mass.  ED visit 04/03/19 c/o of abdominal pain, emesis, and kidney stones.    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 Bilateral nephrolithiasis and left-sided obstructive/inflammatory changes related to passage of a 4 mm stone in bladder were also noted.   He had a previous robotic prostate cancer surgery in [place]. No access to records yet.   He reports of urinary frequency with no other bothersome urinary symptoms.   Denies gross hematuria.   PMH: Past Medical History:  Diagnosis Date  . Arthritis of knee   . 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 Coronary artery disease   . Decreased libido 01/10/2013  . Depression   . DM (diabetes mellitus) ([place])   . 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 Elevated prostate specific antigen (PSA) 02/21/2013  . Hemorrhoid   . History of kidney stones   . Hyperlipidemia   . Hypertension   . Incomplete bladder emptying 01/10/2013  . Malignant neoplasm of prostate ([place]) 04/28/2013  . Obstructive sleep apnea on CPAP    doesn't use regularly    Surgical History: Past Surgical History:  Procedure Laterality Date  . COLONOSCOPY    . CORONARY STENT INTERVENTION N/A 08/20/2017   Procedure: CORONARY STENT INTERVENTION;  Surgeon: Yolonda Kida, MD;  Location: [facility];  Service: Cardiovascular;  Laterality: N/A;  . LEFT HEART CATH AND CORONARY ANGIOGRAPHY N/A 08/20/2017   Procedure: LEFT HEART CATH AND CORONARY ANGIOGRAPHY;  Surgeon: Teodoro Spray, MD;  Location: [facility];  Service: Cardiovascular;  Laterality: N/A;  . PROSTATE SURGERY  07/14/2013   Prostatectomy    Home Medications:  Allergies as of 05/17/2019   No Known Allergies     Medication List       Accurate as of May 17, 2019  2:33 PM. If you have any questions, ask your nurse or doctor.        aspirin 81 MG EC tablet Take by mouth.   atorvastatin 80 MG tablet Commonly known as: LIPITOR Take 80 mg by mouth daily.   metFORMIN 500 MG tablet Commonly known as: GLUCOPHAGE Take 1 tablet (500 mg total) by mouth 2 (two) times daily with a meal.   metoprolol tartrate 25 MG tablet Commonly known as: LOPRESSOR Take 1 tablet (25 mg total) by mouth 2 (two) times daily.   nitroGLYCERIN 0.4 MG SL tablet Commonly known as: NITROSTAT Place 1 tablet (0.4 mg total) under the tongue every 5 (five) minutes as needed for chest pain.   Vitamin D 125 MCG (5000 UT) Caps Take 5,000 Units by mouth daily.       Allergies: No Known Allergies  Family History: Family History  Problem Relation Age of Onset  . Pancreatic cancer Brother   . Alcoholism Mother   . Alcoholism Father   . Prostate cancer Neg Hx   . Kidney disease Neg Hx     Social History:  reports that  he has never smoked. He has never used smokeless tobacco. He reports that he does not drink alcohol or use drugs.   Physical Exam: BP (!) 154/85   Pulse (!) 103   Ht 6\' 1"  (1.854 m)   Wt 298 lb (135.2 kg)   BMI 39.32 kg/m   Constitutional:  Alert and oriented, No acute distress. HEENT: [place] AT, moist mucus membranes.  Trachea midline, no masses. Cardiovascular: No clubbing, cyanosis, or edema. Respiratory: Normal respiratory effort, no increased work of breathing. Skin: No rashes, bruises or suspicious lesions. Neurologic: Grossly intact, no focal deficits, moving all 4 extremities. Psychiatric: Normal mood and  affect.  Laboratory Data: Lab Results  Component Value Date   CREATININE 0.81 05/17/2019    Lab Results  Component Value Date   HGBA1C 7.5 (H) 05/17/2019   Urinalysis:  UA today negative.   Pertinent Imaging: CLINICAL DATA:  Abdominal distention with lower abdominal pain  EXAM: CT ABDOMEN AND PELVIS WITH CONTRAST  TECHNIQUE: Multidetector CT imaging of the abdomen and pelvis was performed using the standard protocol following bolus administration of intravenous contrast.  CONTRAST:  189mL OMNIPAQUE IOHEXOL 300 MG/ML  SOLN  COMPARISON:  Abdominal CT 04/21/2012  FINDINGS: Lower chest:  Coronary atherosclerosis.  Mild atelectasis  Hepatobiliary: No focal liver abnormality.No evidence of biliary obstruction or stone.  Pancreas: Suspect a splenule at the tail the pancreas.  Spleen: Unremarkable.  Adrenals/Urinary Tract: Negative adrenals. Asymmetric left renal expansion, perinephric stranding, and ureteral thickening with periureteric edema. No hydronephrosis. There is a 4 mm stone layering in the bladder. Bilateral intrarenal calculi, at least 4 on the left and 3 on the right. The larges is t on the right and measures up to 7 mm. Small cystic density in the left renal cortex. There is a soft tissue nodule with calcification at the dome of the bladder measuring 16 mm, new from 2014.  Stomach/Bowel: No obstruction. No evidence of bowel inflammation. Mild colonic diverticulosis.  Vascular/Lymphatic: No acute vascular abnormality. No mass or adenopathy.  Reproductive:Prostatectomy. No worrisome findings in the operative bed.  Other: No ascites or pneumoperitoneum.  Musculoskeletal: No acute abnormalities. Spinal degeneration. No sclerotic bone lesion.  IMPRESSION: 1. Left-sided obstructive/inflammatory changes likely related to recent passage of a 4 mm stone which is layering in the bladder. 2. 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 Recommend urology follow-up. 3. Bilateral nephrolithiasis.   Electronically Signed   By: Monte Fantasia M.D.   On: 04/03/2019 10:06  I have personally reviewed the images and agree with radiologist interpretation.   Assessment & Plan:    1. Bladder mass  UA negative  - no blood CT scan reviewed, concerning for urachal malignancy vs. Post surgical changes related to prostate surgery (bladder takedown), records requested Office cysto for direct visualization ASAP Discussed possible bx if tumor present  Return tomorrow for cystoscopy   2. Hx of Prostate Cancer Requested record release from [facility]  No recent PSA PSA today   3. Kidney stones CT shows nonobstructing bilateral nephrolithiasis- asymptomatic currently Recent kidney stones led to incidental bladder findings    [facility] [address], [place], [place] [address] [phone]  I, Lucas Mallow, am acting as a scribe for Dr. Hollice Espy,  I have reviewed the above documentation for accuracy and completeness, and I agree with the above.   Hollice Espy, MD  I spent 60 total minutes on the day of the encounter including pre-visit review of the medical record, face-to-face time with the patient, and post visit ordering of labs/imaging/tests.

## 2019-05-17 ENCOUNTER — Encounter
Admission: RE | Admit: 2019-05-17 | Discharge: 2019-05-17 | Disposition: A | Discharge status: Home / Self Care | Payer: 59 | Source: Ambulatory Visit | Attending: Orthopedic Surgery | Admitting: Orthopedic Surgery

## 2019-05-17 ENCOUNTER — Encounter: Payer: Self-pay | Admitting: Urology

## 2019-05-17 ENCOUNTER — Other Ambulatory Visit: Payer: Self-pay

## 2019-05-17 ENCOUNTER — Ambulatory Visit (INDEPENDENT_AMBULATORY_CARE_PROVIDER_SITE_OTHER): Payer: 59 | Admitting: Urology

## 2019-05-17 VITALS — BP 154/85 | HR 103 | Ht 73.0 in | Wt 298.0 lb

## 2019-05-17 DIAGNOSIS — N200 Calculus of kidney: Secondary | ICD-10-CM | POA: Diagnosis not present

## 2019-05-17 DIAGNOSIS — Z01818 Encounter for other preprocedural examination: Secondary | ICD-10-CM | POA: Diagnosis not present

## 2019-05-17 DIAGNOSIS — Z8546 Personal history of malignant neoplasm of prostate: Secondary | ICD-10-CM

## 2019-05-17 LAB — CBC
HCT: 40 % (ref 39.0–52.0)
Hemoglobin: 12.9 g/dL — ABNORMAL LOW (ref 13.0–17.0)
MCH: 25.7 pg — ABNORMAL LOW (ref 26.0–34.0)
MCHC: 32.3 g/dL (ref 30.0–36.0)
MCV: 79.7 fL — ABNORMAL LOW (ref 80.0–100.0)
Platelets: 287 10*3/uL (ref 150–400)
RBC: 5.02 MIL/uL (ref 4.22–5.81)
RDW: 14.8 % (ref 11.5–15.5)
WBC: 5.6 10*3/uL (ref 4.0–10.5)
nRBC: 0 % (ref 0.0–0.2)

## 2019-05-17 LAB — URINALYSIS, ROUTINE W REFLEX MICROSCOPIC
Bilirubin Urine: NEGATIVE
Glucose, UA: NEGATIVE mg/dL
Hgb urine dipstick: NEGATIVE
Ketones, ur: NEGATIVE mg/dL
Leukocytes,Ua: NEGATIVE
Nitrite: NEGATIVE
Protein, ur: NEGATIVE mg/dL
Specific Gravity, Urine: 1.02 (ref 1.005–1.030)
pH: 5 (ref 5.0–8.0)

## 2019-05-17 LAB — COMPREHENSIVE METABOLIC PANEL
ALT: 21 U/L (ref 0–44)
AST: 22 U/L (ref 15–41)
Albumin: 3.9 g/dL (ref 3.5–5.0)
Alkaline Phosphatase: 99 U/L (ref 38–126)
Anion gap: 9 (ref 5–15)
BUN: 17 mg/dL (ref 8–23)
CO2: 23 mmol/L (ref 22–32)
Calcium: 9.2 mg/dL (ref 8.9–10.3)
Chloride: 105 mmol/L (ref 98–111)
Creatinine, Ser: 0.81 mg/dL (ref 0.61–1.24)
GFR calc Af Amer: >60 mL/min (ref >60)
GFR calc non Af Amer: >60 mL/min (ref >60)
Glucose, Bld: 160 mg/dL — ABNORMAL HIGH (ref 70–99)
Potassium: 4 mmol/L (ref 3.5–5.1)
Sodium: 137 mmol/L (ref 135–145)
Total Bilirubin: 1 mg/dL (ref 0.3–1.2)
Total Protein: 7 g/dL (ref 6.5–8.1)

## 2019-05-17 LAB — PROTIME-INR
INR: 0.9 (ref 0.8–1.2)
Prothrombin Time: 12.5 seconds (ref 11.4–15.2)

## 2019-05-17 LAB — APTT: aPTT: 28 seconds (ref 24–36)

## 2019-05-17 LAB — TYPE AND SCREEN
ABO/RH(D): B POS
Antibody Screen: NEGATIVE

## 2019-05-17 LAB — C-REACTIVE PROTEIN: CRP: 0.6 mg/dL (ref <1.0)

## 2019-05-17 LAB — HEMOGLOBIN A1C
Hgb A1c MFr Bld: 7.5 % — ABNORMAL HIGH (ref 4.8–5.6)
Mean Plasma Glucose: 168.55 mg/dL

## 2019-05-17 LAB — SEDIMENTATION RATE: Sed Rate: 7 mm/hr (ref 0–20)

## 2019-05-17 LAB — SURGICAL PCR SCREEN
MRSA, PCR: NEGATIVE
Staphylococcus aureus: POSITIVE — AB

## 2019-05-17 NOTE — Progress Notes (Signed)
   05/18/19  CC:  Chief Complaint  Patient presents with  . Cysto    HPI: Daniel Bradley. is a 64 y.o. M with PMHx of gleason 3+4 prostate cancer s/p radical prostatectomy in 2015 returns today for a cysto for the evaluation and management of bladder mass.   ED visit 04/03/19 c/o of abdominal pain, emesis, and kidney stones.    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 Bilateral nephrolithiasis and left-sided obstructive/inflammatory changes related to passage of a 4 mm stone in bladder were also noted.   He reports of bothersome urinary frequency however well controlled diabetes.   See previous note for details.   Today's Vitals   05/18/19 1355  BP: (!) 174/100  Pulse: 86  Weight: 298 lb (135.2 kg)  Height: 6\' 1"  (1.854 m)   Body mass index is 39.32 kg/m. NED. A&Ox3.   No respiratory distress   Abd soft, NT, ND Normal phallus with bilateral descended testicles  Cystoscopy Procedure Note  Patient identification was confirmed, informed consent was obtained, and patient was prepped using Betadine solution.  Lidocaine jelly was administered per urethral meatus.     Pre-Procedure: - Inspection reveals a normal caliber ureteral meatus.  Procedure: The flexible cystoscope was introduced without difficulty - No urethral strictures/lesions are present. - Surgically absent prostate  - Normal bladder neck - Bilateral ureteral orifices identified - Bladder mucosa  reveals no ulcers, tumors, or lesions - No bladder stones - No trabeculation  Retroflexion shows no abnormalities.   Post-Procedure: - Patient tolerated the procedure well  Assessment/ Plan:  1. Bladder mass Cysto completely unremarkable  Discussed repeat CT vs interventional radiology bx vs surgical removal  Requested records from previous care   Will return in 2 weeks  via virtual visit for further evaluation  Plan to review in tumor board  2. Hx of prostate cancer  PMHx of gleason 3+4 prostate cancer s/p radical prostatectomy in 2015   PSA undetectable  3. Urinary frequency  Discussed importance of tight glucose control  Rx of oxybutynin 10 mg given  Will evaluation at next visit   F/u 2 weeks  I, Nethusan Sivanesan, am acting as a scribe for Dr. Hollice Espy,  I have reviewed the above documentation for accuracy and completeness, and I agree with the above.   Hollice Espy, MD

## 2019-05-17 NOTE — Patient Instructions (Signed)

## 2019-05-18 ENCOUNTER — Telehealth: Payer: Self-pay

## 2019-05-18 ENCOUNTER — Telehealth: Payer: Self-pay | Admitting: *Deleted

## 2019-05-18 ENCOUNTER — Encounter: Payer: Self-pay | Admitting: Urology

## 2019-05-18 ENCOUNTER — Ambulatory Visit (INDEPENDENT_AMBULATORY_CARE_PROVIDER_SITE_OTHER): Payer: 59 | Admitting: Urology

## 2019-05-18 VITALS — BP 174/100 | HR 86 | Ht 73.0 in | Wt 298.0 lb

## 2019-05-18 DIAGNOSIS — Z8546 Personal history of malignant neoplasm of prostate: Secondary | ICD-10-CM | POA: Diagnosis not present

## 2019-05-18 DIAGNOSIS — N329 Bladder disorder, unspecified: Secondary | ICD-10-CM

## 2019-05-18 DIAGNOSIS — N3289 Other specified disorders of bladder: Secondary | ICD-10-CM

## 2019-05-18 DIAGNOSIS — R3915 Urgency of urination: Secondary | ICD-10-CM

## 2019-05-18 LAB — URINALYSIS, COMPLETE
Bilirubin, UA: NEGATIVE
Glucose, UA: NEGATIVE
Ketones, UA: NEGATIVE
Leukocytes,UA: NEGATIVE
Nitrite, UA: NEGATIVE
Protein,UA: NEGATIVE
RBC, UA: NEGATIVE
Specific Gravity, UA: 1.025 (ref 1.005–1.030)
Urobilinogen, Ur: 1 mg/dL (ref 0.2–1.0)
pH, UA: 5.5 (ref 5.0–7.5)

## 2019-05-18 LAB — URINE CULTURE
Culture: NO GROWTH
Special Requests: NORMAL

## 2019-05-18 LAB — MICROSCOPIC EXAMINATION: RBC, Urine: NONE SEEN /hpf (ref 0–2)

## 2019-05-18 LAB — PSA: Prostate Specific Ag, Serum: <0.1 ng/mL (ref 0.0–4.0)

## 2019-05-18 MED ORDER — OXYBUTYNIN CHLORIDE ER 10 MG PO TB24: 10.0000 mg | ORAL_TABLET | Freq: Every day | ORAL | 2 refills | Status: DC

## 2019-05-18 NOTE — Telephone Encounter (Addendum)
Patient aware, voiced understanding.   ----- Message from Hollice Espy, MD sent at 05/18/2019  8:05 AM EDT ----- PSA is undetectable

## 2019-05-18 NOTE — Telephone Encounter (Signed)
Patient called back after appt today with information  [facility]  Dr. Zollie Pee  Phone: [phone] Fax: [phone]

## 2019-05-19 ENCOUNTER — Other Ambulatory Visit
Admission: RE | Admit: 2019-05-19 | Discharge: 2019-05-19 | Disposition: A | Discharge status: Home / Self Care | Payer: 59 | Source: Ambulatory Visit | Attending: Orthopedic Surgery | Admitting: Orthopedic Surgery

## 2019-05-19 ENCOUNTER — Other Ambulatory Visit: Payer: 59

## 2019-05-19 DIAGNOSIS — Z20822 Contact with and (suspected) exposure to covid-19: Secondary | ICD-10-CM | POA: Diagnosis not present

## 2019-05-19 DIAGNOSIS — Z01812 Encounter for preprocedural laboratory examination: Secondary | ICD-10-CM | POA: Diagnosis not present

## 2019-05-19 LAB — SARS CORONAVIRUS 2 (TAT 6-24 HRS): SARS Coronavirus 2: NEGATIVE

## 2019-05-19 NOTE — Telephone Encounter (Signed)
Faxed release to request records

## 2019-05-22 NOTE — H&P (Signed)
ORTHOPAEDIC HISTORY & PHYSICAL Progress Notes Gwenlyn Fudge, [place] - 05/19/2019 2:15 PM EDT [facility] Chief Complaint:   Chief Complaint  Patient presents with  . Left Knee - Pre-operative Evaluation, Pain   History of Present Illness:   Daniel Bradley. is a 64 y.o. male that presents to clinic today for his preoperative history and evaluation. Patient presents unaccompanied. The patient is scheduled to undergo a left total knee arthroplasty on 05/23/19 by Dr. Marry Guan. His pain began several years ago. The pain is located primarily along the medial aspect of the knee. He reports associated giving way of the knee. He denies associated numbness or tingling, denies swelling, locking.   The patient's symptoms have progressed to the point that they decrease his quality of life. The patient has previously undergone conservative treatment including NSAIDS and injections to the knee without adequate control of his symptoms.  Patient denies history of back surgery or blood clots. Patient sees Dr. Ubaldo Glassing due to history of MI in 2019.   Past Medical, Surgical, Family, Social History, Allergies, Medications:   Past Medical History:  Past Medical History:  Diagnosis Date  . Diabetes mellitus type 2, uncomplicated (CMS-HCC)  . Heart disease  Cardiac surgery with 2 stents  . History of prostate cancer  . Hyperlipidemia  . Hypertension  . Prostate cancer (CMS-HCC)  . Sleep apnea  Issues with CPAP   Past Surgical History:  Past Surgical History:  Procedure Laterality Date  . CORONARY ANGIOPLASTY  Stent placement-2  . prostectomy 2015   Current Medications:  Current Outpatient Medications  Medication Sig Dispense Refill  . aspirin 81 MG EC tablet Take 1 tablet by mouth once daily  . atorvastatin (LIPITOR) 80 MG tablet Take 1 tablet (80 mg total) by mouth once daily 30 tablet 11  . cholecalciferol (VITAMIN D3) 5,000 unit capsule Take by  mouth  . metFORMIN (GLUCOPHAGE) 500 MG tablet Take 1 tablet by mouth 2 (two) times daily  . metoprolol tartrate (LOPRESSOR) 25 MG tablet TAKE 1 TABLET(25 MG) BY MOUTH TWICE DAILY 180 tablet 1  . diclofenac (VOLTAREN) 1 % topical gel Apply 2 g topically 4 (four) times daily as needed (Patient not taking: Reported on 05/19/2019 ) 100 g 1  . ergocalciferol, vitamin D2, 50,000 unit capsule Take 50,000 Units by mouth once a week   No current facility-administered medications for this visit.   Allergies: No Known Allergies  Social History:  Social History   Socioeconomic History  . Marital status: Single  Spouse name: Not on file  . Number of children: 2  . Years of education: 29  . Highest education level: Not on file  Occupational History  . Occupation: Retired  Tobacco Use  . Smoking status: Never Smoker  . Smokeless tobacco: Never Used  Vaping Use  . Vaping Use: Never used  Substance and Sexual Activity  . Alcohol use: Never  . Drug use: Never  . Sexual activity: Defer  Partners: Female  Other Topics Concern  . Not on file  Social History Narrative  . Not on file   Social Determinants of Health   Financial Resource Strain:  . Difficulty of Paying Living Expenses:  Food Insecurity:  . Worried About Charity fundraiser in the Last Year:  . Arboriculturist in the Last Year:  Transportation Needs:  . Film/video editor (Medical):  Marland Kitchen Lack of Transportation (Non-Medical):  Physical Activity:  . Days of  Exercise per Week:  . Minutes of Exercise per Session:  Stress:  . Feeling of Stress :  Social Connections:  . Frequency of Communication with Friends and Family:  . Frequency of Social Gatherings with Friends and Family:  . Attends Religious Services:  . Active Member of Clubs or Organizations:  . Attends Archivist Meetings:  Marland Kitchen Marital Status:   Family History:  Family History  Problem Relation Age of Onset  . High blood pressure (Hypertension) Mother   . High blood pressure (Hypertension) Maternal Grandmother   Review of Systems:   A 10+ ROS was performed, reviewed, and the pertinent orthopaedic findings are documented in the HPI.   Physical Examination:   BP (!) 162/100 (BP Location: Left upper arm, Patient Position: Sitting, BP Cuff Size: Adult)  Ht 185.4 cm (6\' 1" )  Wt (!) 124.4 kg (274 lb 3.2 oz)  BMI 36.18 kg/m   Patient is a well-developed, well-nourished male in no acute distress. Patient has normal mood and affect. Patient is alert and oriented to person, place, and time.   HEENT: Atraumatic, normocephalic. Pupils equal and reactive to light. Extraocular motion intact. Noninjected sclera.  Cardiovascular: Regular rate and rhythm, with no murmurs, rubs, or gallops. Distal pulses palpable.  Respiratory: Lungs clear to auscultation bilaterally.   Left Knee: Soft tissue swelling: minimal Effusion: none Erythema: none Crepitance: mild Tenderness: medial Alignment: relative varus Mediolateral laxity: medial pseudolaxity Posterior sag: negative Patellar tracking: Good tracking without evidence of subluxation or tilt Atrophy: No significant atrophy.  Quadriceps tone was good. Range of motion: 0/0/123 degrees   Sensation intact over the saphenous, lateral sural cutaneous, superficial fibular, and deep fibular nerve distributions.  Tests Performed/Reviewed:  X-rays  No new radiographs were obtained today. Previous radiographs were reviewed of the left knee and revealed complete loss of medial compartment joint space with very mild osteophyte formation. Lateral compartment reveals mild to moderate loss of joint space with associated osteophyte formation. Patellofemoral joint reveals mild loss of joint space. No fractures or dislocations noted.  Impression:   ICD-10-CM  1. Primary osteoarthritis of left knee M17.12   Plan:   The patient has end-stage degenerative changes of the left knee. It was explained to the  patient that the condition is progressive in nature. Having failed conservative treatment, the patient has elected to proceed with a total joint arthroplasty. The patient will undergo a total joint arthroplasty with Dr. Marry Guan. The risks of surgery, including blood clot and infection, were discussed with the patient. Measures to reduce these risks, including the use of anticoagulation, perioperative antibiotics, and early ambulation were discussed. The importance of postoperative physical therapy was discussed with the patient. The patient elects to proceed with surgery. The patient is instructed to stop all blood thinners prior to surgery. The patient is instructed to call the hospital the day before surgery to learn of the proper arrival time.   Contact our office with any questions or concerns. Follow up as indicated, or sooner should any new problems arise, if conditions worsen, or if they are otherwise concerned.   Gwenlyn Fudge, PA [facility] [place] [place], [place] [address] Phone: [phone]  This note was generated in part with voice recognition software and I apologize for any typographical errors that were not detected and corrected.   Electronically signed by Gwenlyn Fudge, PA at 05/22/2019 2:25 PM EDT

## 2019-05-23 ENCOUNTER — Encounter: Payer: Self-pay | Admitting: Orthopedic Surgery

## 2019-05-23 ENCOUNTER — Inpatient Hospital Stay: Payer: 59

## 2019-05-23 ENCOUNTER — Inpatient Hospital Stay: Payer: 59 | Admitting: Anesthesiology

## 2019-05-23 ENCOUNTER — Inpatient Hospital Stay
Admission: RE | Admit: 2019-05-23 | Discharge: 2019-05-25 | DRG: 470 | Disposition: A | Discharge status: Home Health | Payer: 59 | Attending: Orthopedic Surgery | Admitting: Orthopedic Surgery

## 2019-05-23 ENCOUNTER — Other Ambulatory Visit: Payer: Self-pay

## 2019-05-23 ENCOUNTER — Encounter
Admission: RE | Disposition: A | Discharge status: Home Health | Payer: Self-pay | Source: Home / Self Care | Attending: Orthopedic Surgery

## 2019-05-23 DIAGNOSIS — I252 Old myocardial infarction: Secondary | ICD-10-CM | POA: Diagnosis not present

## 2019-05-23 DIAGNOSIS — Z96659 Presence of unspecified artificial knee joint: Secondary | ICD-10-CM

## 2019-05-23 DIAGNOSIS — Z6839 Body mass index (BMI) 39.0-39.9, adult: Secondary | ICD-10-CM

## 2019-05-23 DIAGNOSIS — M1712 Unilateral primary osteoarthritis, left knee: Secondary | ICD-10-CM | POA: Diagnosis not present

## 2019-05-23 DIAGNOSIS — Z9119 Patient's noncompliance with other medical treatment and regimen: Secondary | ICD-10-CM | POA: Diagnosis not present

## 2019-05-23 DIAGNOSIS — M25762 Osteophyte, left knee: Secondary | ICD-10-CM | POA: Diagnosis present

## 2019-05-23 DIAGNOSIS — Z20822 Contact with and (suspected) exposure to covid-19: Secondary | ICD-10-CM | POA: Diagnosis not present

## 2019-05-23 DIAGNOSIS — Z471 Aftercare following joint replacement surgery: Secondary | ICD-10-CM | POA: Diagnosis not present

## 2019-05-23 DIAGNOSIS — I10 Essential (primary) hypertension: Secondary | ICD-10-CM | POA: Diagnosis not present

## 2019-05-23 DIAGNOSIS — N529 Male erectile dysfunction, unspecified: Secondary | ICD-10-CM | POA: Diagnosis present

## 2019-05-23 DIAGNOSIS — I2510 Atherosclerotic heart disease of native coronary artery without angina pectoris: Secondary | ICD-10-CM | POA: Diagnosis present

## 2019-05-23 DIAGNOSIS — F329 Major depressive disorder, single episode, unspecified: Secondary | ICD-10-CM | POA: Diagnosis present

## 2019-05-23 DIAGNOSIS — Z955 Presence of coronary angioplasty implant and graft: Secondary | ICD-10-CM

## 2019-05-23 DIAGNOSIS — E119 Type 2 diabetes mellitus without complications: Secondary | ICD-10-CM | POA: Diagnosis present

## 2019-05-23 DIAGNOSIS — E785 Hyperlipidemia, unspecified: Secondary | ICD-10-CM | POA: Diagnosis not present

## 2019-05-23 DIAGNOSIS — G4733 Obstructive sleep apnea (adult) (pediatric): Secondary | ICD-10-CM | POA: Diagnosis present

## 2019-05-23 DIAGNOSIS — R69 Illness, unspecified: Secondary | ICD-10-CM | POA: Diagnosis not present

## 2019-05-23 DIAGNOSIS — N320 Bladder-neck obstruction: Secondary | ICD-10-CM | POA: Diagnosis present

## 2019-05-23 DIAGNOSIS — Z8249 Family history of ischemic heart disease and other diseases of the circulatory system: Secondary | ICD-10-CM | POA: Diagnosis not present

## 2019-05-23 DIAGNOSIS — Z96652 Presence of left artificial knee joint: Secondary | ICD-10-CM | POA: Diagnosis not present

## 2019-05-23 DIAGNOSIS — Z8546 Personal history of malignant neoplasm of prostate: Secondary | ICD-10-CM | POA: Diagnosis not present

## 2019-05-23 DIAGNOSIS — E669 Obesity, unspecified: Secondary | ICD-10-CM | POA: Diagnosis present

## 2019-05-23 HISTORY — PX: DG KNEE RIGHT COMPLETE (ARMC HX): HXRAD1559

## 2019-05-23 HISTORY — PX: KNEE ARTHROPLASTY: SHX992

## 2019-05-23 LAB — GLUCOSE, CAPILLARY
Glucose-Capillary: 162 mg/dL — ABNORMAL HIGH (ref 70–99)
Glucose-Capillary: 169 mg/dL — ABNORMAL HIGH (ref 70–99)
Glucose-Capillary: 166 mg/dL — ABNORMAL HIGH (ref 70–99)
Glucose-Capillary: 236 mg/dL — ABNORMAL HIGH (ref 70–99)

## 2019-05-23 LAB — ABO/RH: ABO/RH(D): B POS

## 2019-05-23 SURGERY — ARTHROPLASTY, KNEE, TOTAL, USING IMAGELESS COMPUTER-ASSISTED NAVIGATION
Anesthesia: Spinal | Site: Knee | Laterality: Left

## 2019-05-23 MED ORDER — DIPHENHYDRAMINE HCL 12.5 MG/5ML PO ELIX: 12.5000 mg | ORAL_SOLUTION | ORAL | Status: DC | PRN

## 2019-05-23 MED ORDER — TRANEXAMIC ACID-NACL 1000-0.7 MG/100ML-% IV SOLN: 1000.0000 mg | Freq: Once | INTRAVENOUS | Status: AC

## 2019-05-23 MED ORDER — DEXAMETHASONE SODIUM PHOSPHATE 10 MG/ML IJ SOLN: 8.0000 mg | Freq: Once | INTRAMUSCULAR | Status: AC

## 2019-05-23 MED ORDER — BISACODYL 10 MG RE SUPP
10.0000 mg | Freq: Every day | RECTAL | Status: DC | PRN
  Administered 2019-05-25: 10 mg via RECTAL
  Filled 2019-05-23: qty 1

## 2019-05-23 MED ORDER — OXYBUTYNIN CHLORIDE ER 5 MG PO TB24
10.0000 mg | ORAL_TABLET | Freq: Every day | ORAL | Status: DC
  Administered 2019-05-24 – 2019-05-25 (×2): 10 mg via ORAL
  Filled 2019-05-23 (×2): qty 2

## 2019-05-23 MED ORDER — GABAPENTIN 300 MG PO CAPS
ORAL_CAPSULE | ORAL | Status: AC
  Administered 2019-05-23: 300 mg via ORAL
  Filled 2019-05-23: qty 1

## 2019-05-23 MED ORDER — CELECOXIB 200 MG PO CAPS: 400.0000 mg | ORAL_CAPSULE | Freq: Once | ORAL | Status: AC

## 2019-05-23 MED ORDER — OXYCODONE HCL 5 MG PO TABS
10.0000 mg | ORAL_TABLET | ORAL | Status: DC | PRN
  Administered 2019-05-24 – 2019-05-25 (×2): 10 mg via ORAL
  Filled 2019-05-23 (×2): qty 2

## 2019-05-23 MED ORDER — FAMOTIDINE 20 MG PO TABS
ORAL_TABLET | ORAL | Status: AC
  Administered 2019-05-23: 20 mg via ORAL
  Filled 2019-05-23: qty 1

## 2019-05-23 MED ORDER — GABAPENTIN 300 MG PO CAPS: 300.0000 mg | ORAL_CAPSULE | Freq: Once | ORAL | Status: AC

## 2019-05-23 MED ORDER — FENTANYL CITRATE (PF) 100 MCG/2ML IJ SOLN: 25.0000 ug | INTRAMUSCULAR | Status: DC | PRN

## 2019-05-23 MED ORDER — CELECOXIB 200 MG PO CAPS
200.0000 mg | ORAL_CAPSULE | Freq: Two times a day (BID) | ORAL | Status: DC
  Administered 2019-05-23 – 2019-05-25 (×4): 200 mg via ORAL
  Filled 2019-05-23 (×4): qty 1

## 2019-05-23 MED ORDER — SODIUM CHLORIDE 0.9 % IV SOLN
INTRAVENOUS | Status: DC | PRN
  Administered 2019-05-23: 50 ug/min via INTRAVENOUS

## 2019-05-23 MED ORDER — METOPROLOL TARTRATE 25 MG PO TABS
25.0000 mg | ORAL_TABLET | Freq: Two times a day (BID) | ORAL | Status: DC
  Administered 2019-05-23 – 2019-05-25 (×4): 25 mg via ORAL
  Filled 2019-05-23 (×4): qty 1

## 2019-05-23 MED ORDER — SODIUM CHLORIDE 0.9 % IV SOLN: INTRAVENOUS | Status: DC

## 2019-05-23 MED ORDER — PANTOPRAZOLE SODIUM 40 MG PO TBEC
40.0000 mg | DELAYED_RELEASE_TABLET | Freq: Two times a day (BID) | ORAL | Status: DC
  Administered 2019-05-23 – 2019-05-25 (×4): 40 mg via ORAL
  Filled 2019-05-23 (×5): qty 1

## 2019-05-23 MED ORDER — TRANEXAMIC ACID-NACL 1000-0.7 MG/100ML-% IV SOLN
INTRAVENOUS | Status: AC
  Filled 2019-05-23: qty 100

## 2019-05-23 MED ORDER — BUPIVACAINE HCL (PF) 0.5 % IJ SOLN
INTRAMUSCULAR | Status: DC | PRN
  Administered 2019-05-23: 3.2 mL

## 2019-05-23 MED ORDER — FLEET ENEMA 7-19 GM/118ML RE ENEM: 1.0000 | ENEMA | Freq: Once | RECTAL | Status: DC | PRN

## 2019-05-23 MED ORDER — METFORMIN HCL 500 MG PO TABS
500.0000 mg | ORAL_TABLET | Freq: Two times a day (BID) | ORAL | Status: DC
  Administered 2019-05-23 – 2019-05-25 (×4): 500 mg via ORAL
  Filled 2019-05-23 (×4): qty 1

## 2019-05-23 MED ORDER — CHLORHEXIDINE GLUCONATE 4 % EX LIQD: 60.0000 mL | Freq: Once | CUTANEOUS | Status: DC

## 2019-05-23 MED ORDER — CEFAZOLIN SODIUM-DEXTROSE 2-4 GM/100ML-% IV SOLN
2.0000 g | Freq: Four times a day (QID) | INTRAVENOUS | Status: AC
  Administered 2019-05-24 (×3): 2 g via INTRAVENOUS
  Filled 2019-05-23 (×4): qty 100

## 2019-05-23 MED ORDER — PHENYLEPHRINE HCL (PRESSORS) 10 MG/ML IV SOLN
INTRAVENOUS | Status: DC | PRN
  Administered 2019-05-23: 100 ug via INTRAVENOUS

## 2019-05-23 MED ORDER — MENTHOL 3 MG MT LOZG
1.0000 | LOZENGE | OROMUCOSAL | Status: DC | PRN
  Filled 2019-05-23: qty 9

## 2019-05-23 MED ORDER — MIDAZOLAM HCL 2 MG/2ML IJ SOLN
INTRAMUSCULAR | Status: AC
  Filled 2019-05-23: qty 2

## 2019-05-23 MED ORDER — ENSURE PRE-SURGERY PO LIQD
296.0000 mL | Freq: Once | ORAL | Status: DC
  Filled 2019-05-23: qty 296

## 2019-05-23 MED ORDER — ATORVASTATIN CALCIUM 20 MG PO TABS
80.0000 mg | ORAL_TABLET | Freq: Every day | ORAL | Status: DC
  Administered 2019-05-24 – 2019-05-25 (×2): 80 mg via ORAL
  Filled 2019-05-23 (×2): qty 4

## 2019-05-23 MED ORDER — CEFAZOLIN SODIUM-DEXTROSE 2-4 GM/100ML-% IV SOLN
2.0000 g | INTRAVENOUS | Status: AC
  Administered 2019-05-23: 2 g via INTRAVENOUS

## 2019-05-23 MED ORDER — CEFAZOLIN SODIUM-DEXTROSE 2-4 GM/100ML-% IV SOLN
INTRAVENOUS | Status: AC
  Administered 2019-05-23: 2 g via INTRAVENOUS
  Filled 2019-05-23: qty 100

## 2019-05-23 MED ORDER — INSULIN ASPART 100 UNIT/ML ~~LOC~~ SOLN
0.0000 [IU] | Freq: Three times a day (TID) | SUBCUTANEOUS | Status: DC
  Administered 2019-05-23: 3 [IU] via SUBCUTANEOUS
  Administered 2019-05-24 (×2): 2 [IU] via SUBCUTANEOUS
  Administered 2019-05-24: 3 [IU] via SUBCUTANEOUS
  Administered 2019-05-25: 2 [IU] via SUBCUTANEOUS
  Filled 2019-05-23 (×5): qty 1

## 2019-05-23 MED ORDER — FERROUS SULFATE 325 (65 FE) MG PO TABS
325.0000 mg | ORAL_TABLET | Freq: Two times a day (BID) | ORAL | Status: DC
  Administered 2019-05-24 – 2019-05-25 (×3): 325 mg via ORAL
  Filled 2019-05-23 (×3): qty 1

## 2019-05-23 MED ORDER — ALUM & MAG HYDROXIDE-SIMETH 200-200-20 MG/5ML PO SUSP: 30.0000 mL | ORAL | Status: DC | PRN

## 2019-05-23 MED ORDER — METOCLOPRAMIDE HCL 5 MG/ML IJ SOLN: 5.0000 mg | Freq: Three times a day (TID) | INTRAMUSCULAR | Status: DC | PRN

## 2019-05-23 MED ORDER — PHENOL 1.4 % MT LIQD
1.0000 | OROMUCOSAL | Status: DC | PRN
  Filled 2019-05-23: qty 177

## 2019-05-23 MED ORDER — SODIUM CHLORIDE 0.9 % IV SOLN
INTRAVENOUS | Status: DC | PRN
  Administered 2019-05-23: 60 mL

## 2019-05-23 MED ORDER — ONDANSETRON HCL 4 MG/2ML IJ SOLN: 4.0000 mg | Freq: Four times a day (QID) | INTRAMUSCULAR | Status: DC | PRN

## 2019-05-23 MED ORDER — VITAMIN D 25 MCG (1000 UNIT) PO TABS
5000.0000 [IU] | ORAL_TABLET | Freq: Every day | ORAL | Status: DC
  Administered 2019-05-24 – 2019-05-25 (×2): 5000 [IU] via ORAL
  Filled 2019-05-23 (×2): qty 5

## 2019-05-23 MED ORDER — GABAPENTIN 300 MG PO CAPS
300.0000 mg | ORAL_CAPSULE | Freq: Every day | ORAL | Status: DC
  Administered 2019-05-23 – 2019-05-24 (×2): 300 mg via ORAL
  Filled 2019-05-23 (×2): qty 1

## 2019-05-23 MED ORDER — FAMOTIDINE 20 MG PO TABS: 20.0000 mg | ORAL_TABLET | Freq: Once | ORAL | Status: AC

## 2019-05-23 MED ORDER — OXYCODONE HCL 5 MG PO TABS
5.0000 mg | ORAL_TABLET | ORAL | Status: DC | PRN
  Administered 2019-05-23 – 2019-05-25 (×2): 5 mg via ORAL
  Filled 2019-05-23 (×2): qty 1

## 2019-05-23 MED ORDER — ONDANSETRON HCL 4 MG PO TABS: 4.0000 mg | ORAL_TABLET | Freq: Four times a day (QID) | ORAL | Status: DC | PRN

## 2019-05-23 MED ORDER — PROPOFOL 10 MG/ML IV BOLUS
INTRAVENOUS | Status: AC
  Filled 2019-05-23: qty 20

## 2019-05-23 MED ORDER — PROPOFOL 500 MG/50ML IV EMUL
INTRAVENOUS | Status: AC
  Filled 2019-05-23: qty 50

## 2019-05-23 MED ORDER — ONDANSETRON HCL 4 MG/2ML IJ SOLN: 4.0000 mg | Freq: Once | INTRAMUSCULAR | Status: DC | PRN

## 2019-05-23 MED ORDER — MAGNESIUM HYDROXIDE 400 MG/5ML PO SUSP
30.0000 mL | Freq: Every day | ORAL | Status: DC
  Administered 2019-05-24: 30 mL via ORAL
  Filled 2019-05-23: qty 30

## 2019-05-23 MED ORDER — ACETAMINOPHEN 10 MG/ML IV SOLN
INTRAVENOUS | Status: DC | PRN
  Administered 2019-05-23: 1000 mg via INTRAVENOUS

## 2019-05-23 MED ORDER — ACETAMINOPHEN 10 MG/ML IV SOLN
INTRAVENOUS | Status: AC
  Filled 2019-05-23: qty 100

## 2019-05-23 MED ORDER — DEXAMETHASONE SODIUM PHOSPHATE 10 MG/ML IJ SOLN
INTRAMUSCULAR | Status: AC
  Administered 2019-05-23: 8 mg via INTRAVENOUS
  Filled 2019-05-23: qty 1

## 2019-05-23 MED ORDER — TRANEXAMIC ACID-NACL 1000-0.7 MG/100ML-% IV SOLN
INTRAVENOUS | Status: AC
  Administered 2019-05-23: 1000 mg via INTRAVENOUS
  Filled 2019-05-23: qty 100

## 2019-05-23 MED ORDER — NEOMYCIN-POLYMYXIN B GU 40-200000 IR SOLN
Status: DC | PRN
  Administered 2019-05-23: 16 mL

## 2019-05-23 MED ORDER — SENNOSIDES-DOCUSATE SODIUM 8.6-50 MG PO TABS
1.0000 | ORAL_TABLET | Freq: Two times a day (BID) | ORAL | Status: DC
  Administered 2019-05-23 – 2019-05-25 (×4): 1 via ORAL
  Filled 2019-05-23 (×4): qty 1

## 2019-05-23 MED ORDER — METOCLOPRAMIDE HCL 10 MG PO TABS: 5.0000 mg | ORAL_TABLET | Freq: Three times a day (TID) | ORAL | Status: DC | PRN

## 2019-05-23 MED ORDER — MIDAZOLAM HCL 5 MG/5ML IJ SOLN
INTRAMUSCULAR | Status: DC | PRN
  Administered 2019-05-23: 2 mg via INTRAVENOUS

## 2019-05-23 MED ORDER — METOCLOPRAMIDE HCL 10 MG PO TABS
10.0000 mg | ORAL_TABLET | Freq: Three times a day (TID) | ORAL | Status: DC
  Administered 2019-05-23 – 2019-05-25 (×6): 10 mg via ORAL
  Filled 2019-05-23 (×6): qty 1

## 2019-05-23 MED ORDER — PROPOFOL 500 MG/50ML IV EMUL
INTRAVENOUS | Status: DC | PRN
  Administered 2019-05-23: 100 ug/kg/min via INTRAVENOUS

## 2019-05-23 MED ORDER — ENOXAPARIN SODIUM 30 MG/0.3ML ~~LOC~~ SOLN
30.0000 mg | Freq: Two times a day (BID) | SUBCUTANEOUS | Status: DC
  Administered 2019-05-24 – 2019-05-25 (×3): 30 mg via SUBCUTANEOUS
  Filled 2019-05-23 (×3): qty 0.3

## 2019-05-23 MED ORDER — TRANEXAMIC ACID-NACL 1000-0.7 MG/100ML-% IV SOLN
1000.0000 mg | INTRAVENOUS | Status: AC
  Administered 2019-05-23: 1000 mg via INTRAVENOUS

## 2019-05-23 MED ORDER — HYDROMORPHONE HCL 1 MG/ML IJ SOLN: 0.5000 mg | INTRAMUSCULAR | Status: DC | PRN

## 2019-05-23 MED ORDER — PROPOFOL 10 MG/ML IV BOLUS
INTRAVENOUS | Status: DC | PRN
  Administered 2019-05-23: 50 mg via INTRAVENOUS

## 2019-05-23 MED ORDER — BUPIVACAINE HCL (PF) 0.25 % IJ SOLN
INTRAMUSCULAR | Status: DC | PRN
  Administered 2019-05-23: 60 mL

## 2019-05-23 MED ORDER — ACETAMINOPHEN 10 MG/ML IV SOLN
1000.0000 mg | Freq: Four times a day (QID) | INTRAVENOUS | Status: AC
  Administered 2019-05-23 – 2019-05-24 (×3): 1000 mg via INTRAVENOUS
  Filled 2019-05-23 (×4): qty 100

## 2019-05-23 MED ORDER — CELECOXIB 200 MG PO CAPS
ORAL_CAPSULE | ORAL | Status: AC
  Administered 2019-05-23: 400 mg via ORAL
  Filled 2019-05-23: qty 2

## 2019-05-23 MED ORDER — TRAMADOL HCL 50 MG PO TABS
50.0000 mg | ORAL_TABLET | ORAL | Status: DC | PRN
  Administered 2019-05-24 (×2): 50 mg via ORAL
  Filled 2019-05-23 (×2): qty 1

## 2019-05-23 MED ORDER — SODIUM CHLORIDE 0.9 % IV SOLN
INTRAVENOUS | Status: DC
  Administered 2019-05-23: 50 mL/h via INTRAVENOUS

## 2019-05-23 MED ORDER — ACETAMINOPHEN 325 MG PO TABS: 325.0000 mg | ORAL_TABLET | Freq: Four times a day (QID) | ORAL | Status: DC | PRN

## 2019-05-23 SURGICAL SUPPLY — 78 items
ATTUNE MED DOME PAT 41 KNEE (Knees) ×1 IMPLANT
ATTUNE PS FEM LT SZ 6 CEM KNEE (Femur) ×1 IMPLANT
ATTUNE PSRP INSR SZ6 10 KNEE (Insert) ×1 IMPLANT
BASE TIBIAL ROT PLAT SZ 8 KNEE (Knees) IMPLANT
BATTERY INSTRU NAVIGATION (MISCELLANEOUS) ×8 IMPLANT
BLADE SAW 70X12.5 (BLADE) ×2 IMPLANT
BLADE SAW 90X13X1.19 OSCILLAT (BLADE) ×2 IMPLANT
BLADE SAW 90X25X1.19 OSCILLAT (BLADE) ×2 IMPLANT
BONE CEMENT GENTAMICIN (Cement) ×8 IMPLANT
CANISTER SUCT 3000ML PPV (MISCELLANEOUS) ×2 IMPLANT
CEMENT BONE GENTAMICIN 40 (Cement) IMPLANT
COOLER POLAR GLACIER W/PUMP (MISCELLANEOUS) ×2 IMPLANT
COVER WAND RF STERILE (DRAPES) ×2 IMPLANT
CUFF TOURN SGL QUICK 24 (TOURNIQUET CUFF)
CUFF TOURN SGL QUICK 30 (TOURNIQUET CUFF) ×1
CUFF TRNQT CYL 24X4X16.5-23 (TOURNIQUET CUFF) IMPLANT
CUFF TRNQT CYL 30X4X21-28X (TOURNIQUET CUFF) IMPLANT
DRAPE 3/4 80X56 (DRAPES) ×2 IMPLANT
DRSG DERMACEA 8X12 NADH (GAUZE/BANDAGES/DRESSINGS) ×2 IMPLANT
DRSG OPSITE POSTOP 4X14 (GAUZE/BANDAGES/DRESSINGS) ×2 IMPLANT
DRSG TEGADERM 4X4.75 (GAUZE/BANDAGES/DRESSINGS) ×2 IMPLANT
DURAPREP 26ML APPLICATOR (WOUND CARE) ×4 IMPLANT
ELECT REM PT RETURN 9FT ADLT (ELECTROSURGICAL) ×2
ELECTRODE REM PT RTRN 9FT ADLT (ELECTROSURGICAL) ×1 IMPLANT
EX-PIN ORTHOLOCK NAV 4X150 (PIN) ×4 IMPLANT
GLOVE BIO SURGEON STRL SZ7.5 (GLOVE) ×4 IMPLANT
GLOVE BIOGEL M STRL SZ7.5 (GLOVE) ×6 IMPLANT
GLOVE BIOGEL PI IND STRL 7.5 (GLOVE) ×1 IMPLANT
GLOVE BIOGEL PI INDICATOR 7.5 (GLOVE) ×1
GLOVE INDICATOR 8.0 STRL GRN (GLOVE) ×3 IMPLANT
GOWN STRL REUS W/ TWL LRG LVL3 (GOWN DISPOSABLE) ×2 IMPLANT
GOWN STRL REUS W/ TWL XL LVL3 (GOWN DISPOSABLE) ×1 IMPLANT
GOWN STRL REUS W/TWL LRG LVL3 (GOWN DISPOSABLE) ×2
GOWN STRL REUS W/TWL XL LVL3 (GOWN DISPOSABLE) ×1
HEMOVAC 400CC 10FR (MISCELLANEOUS) ×2 IMPLANT
HOLDER FOLEY CATH W/STRAP (MISCELLANEOUS) ×2 IMPLANT
HOOD PEEL AWAY FLYTE STAYCOOL (MISCELLANEOUS) ×4 IMPLANT
KIT TURNOVER KIT A (KITS) ×2 IMPLANT
KNIFE SCULPS 14X20 (INSTRUMENTS) ×2 IMPLANT
LABEL OR SOLS (LABEL) ×2 IMPLANT
MANIFOLD NEPTUNE II (INSTRUMENTS) ×2 IMPLANT
NDL SAFETY ECLIPSE 18X1.5 (NEEDLE) ×1 IMPLANT
NDL SPNL 20GX3.5 QUINCKE YW (NEEDLE) ×2 IMPLANT
NEEDLE HYPO 18GX1.5 SHARP (NEEDLE) ×1
NEEDLE SPNL 20GX3.5 QUINCKE YW (NEEDLE) ×4 IMPLANT
NS IRRIG 500ML POUR BTL (IV SOLUTION) ×2 IMPLANT
PACK TOTAL KNEE (MISCELLANEOUS) ×2 IMPLANT
PAD ABD DERMACEA PRESS 5X9 (GAUZE/BANDAGES/DRESSINGS) ×1 IMPLANT
PAD CAST CTTN 4X4 STRL (SOFTGOODS) IMPLANT
PAD WRAPON POLAR KNEE (MISCELLANEOUS) ×1 IMPLANT
PADDING CAST COTTON 4X4 STRL (SOFTGOODS) ×1
PENCIL SMOKE EVACUATOR COATED (MISCELLANEOUS) ×2 IMPLANT
PENCIL SMOKE ULTRAEVAC 22 CON (MISCELLANEOUS) ×2 IMPLANT
PIN DRILL QUICK PACK ×2 IMPLANT
PIN FIXATION 1/8DIA X 3INL (PIN) ×6 IMPLANT
PULSAVAC PLUS IRRIG FAN TIP (DISPOSABLE) ×2
SOL .9 NS 3000ML IRR  AL (IV SOLUTION) ×1
SOL .9 NS 3000ML IRR UROMATIC (IV SOLUTION) ×1 IMPLANT
SOL PREP PVP 2OZ (MISCELLANEOUS) ×2
SOLUTION PREP PVP 2OZ (MISCELLANEOUS) ×1 IMPLANT
SPONGE DRAIN TRACH 4X4 STRL 2S (GAUZE/BANDAGES/DRESSINGS) ×2 IMPLANT
STAPLER SKIN PROX 35W (STAPLE) ×2 IMPLANT
STOCKINETTE BIAS CUT 6 980064 (GAUZE/BANDAGES/DRESSINGS) ×1 IMPLANT
STOCKINETTE IMPERV 14X48 (MISCELLANEOUS) ×1 IMPLANT
STRAP TIBIA SHORT (MISCELLANEOUS) ×2 IMPLANT
SUCTION FRAZIER HANDLE 10FR (MISCELLANEOUS) ×1
SUCTION TUBE FRAZIER 10FR DISP (MISCELLANEOUS) ×1 IMPLANT
SUT VIC AB 0 CT1 36 (SUTURE) ×4 IMPLANT
SUT VIC AB 1 CT1 36 (SUTURE) ×4 IMPLANT
SUT VIC AB 2-0 CT2 27 (SUTURE) ×2 IMPLANT
SYR 20ML LL LF (SYRINGE) ×2 IMPLANT
SYR 30ML LL (SYRINGE) ×4 IMPLANT
TIBIAL BASE ROT PLAT SZ 8 KNEE (Knees) ×2 IMPLANT
TIP FAN IRRIG PULSAVAC PLUS (DISPOSABLE) ×1 IMPLANT
TOWEL OR 17X26 4PK STRL BLUE (TOWEL DISPOSABLE) ×2 IMPLANT
TOWER CARTRIDGE SMART MIX (DISPOSABLE) ×2 IMPLANT
TRAY FOLEY MTR SLVR 16FR STAT (SET/KITS/TRAYS/PACK) ×2 IMPLANT
WRAPON POLAR PAD KNEE (MISCELLANEOUS) ×2

## 2019-05-23 NOTE — Progress Notes (Signed)
Bladder scan done 96

## 2019-05-23 NOTE — Anesthesia Procedure Notes (Signed)
Date/Time: 05/23/2019 11:25 AM Performed by: Nelda Marseille, CRNA Pre-anesthesia Checklist: Patient identified, Emergency Drugs available, Suction available, Patient being monitored and Timeout performed Oxygen Delivery Method: Simple face mask

## 2019-05-23 NOTE — H&P (Signed)
The patient has been re-examined, and the chart reviewed, and there have been no interval changes to the documented history and physical.    The risks, benefits, and alternatives have been discussed at length. The patient expressed understanding of the risks benefits and agreed with plans for surgical intervention.  Daniel Bradley, Jr. M.D.    

## 2019-05-23 NOTE — Op Note (Signed)
OPERATIVE NOTE  DATE OF SURGERY:  05/23/2019  PATIENT NAME:  Daniel Bradley.   DOB: [birth year]  MRN: [id number]  PRE-OPERATIVE DIAGNOSIS: Degenerative arthrosis of the left knee, primary  POST-OPERATIVE DIAGNOSIS:  Same  PROCEDURE:  Left total knee arthroplasty using computer-assisted navigation  SURGEON:  Marciano Sequin. M.D.  ASSISTANT: Cassell Smiles, PA-C (present and scrubbed throughout the case, critical for assistance with exposure, retraction, instrumentation, and closure)  ANESTHESIA: spinal  ESTIMATED BLOOD LOSS: 50 mL  FLUIDS REPLACED: 1700 mL of crystalloid  TOURNIQUET TIME: 121 minutes  DRAINS: 2 medium Hemovac drains  SOFT TISSUE RELEASES: Anterior cruciate ligament, posterior cruciate ligament, deep medial collateral ligament, patellofemoral ligament  IMPLANTS UTILIZED: DePuy Attune size 6 posterior stabilized femoral component (cemented), size 8 rotating platform tibial component (cemented), 41 mm medialized dome patella (cemented), and a 10 mm stabilized rotating platform polyethylene insert.  INDICATIONS FOR SURGERY: Daniel Bradley. is a 64 y.o. year old male with a long history of progressive knee pain. X-rays demonstrated severe degenerative changes in tricompartmental fashion. The patient had not seen any significant improvement despite conservative nonsurgical intervention. After discussion of the risks and benefits of surgical intervention, the patient expressed understanding of the risks benefits and agree with plans for total knee arthroplasty.   The risks, benefits, and alternatives were discussed at length including but not limited to the risks of infection, bleeding, nerve injury, stiffness, blood clots, the need for revision surgery, cardiopulmonary complications, among others, and they were willing to proceed.  PROCEDURE IN DETAIL: The patient was brought into the operating room and, after adequate spinal anesthesia was achieved, a tourniquet was  placed on the patient's upper thigh. The patient's knee and leg were cleaned and prepped with alcohol and DuraPrep and draped in the usual sterile fashion. A "timeout" was performed as per usual protocol. The lower extremity was exsanguinated using an Esmarch, and the tourniquet was inflated to 300 mmHg. An anterior longitudinal incision was made followed by a standard mid vastus approach. The deep fibers of the medial collateral ligament were elevated in a subperiosteal fashion off of the medial flare of the tibia so as to maintain a continuous soft tissue sleeve. The patella was subluxed laterally and the patellofemoral ligament was incised. Inspection of the knee demonstrated severe degenerative changes with full-thickness loss of articular cartilage. Osteophytes were debrided using a rongeur. Anterior and posterior cruciate ligaments were excised. Two 4.0 mm Schanz pins were inserted in the femur and into the tibia for attachment of the array of trackers used for computer-assisted navigation. Hip center was identified using a circumduction technique. Distal landmarks were mapped using the computer. The distal femur and proximal tibia were mapped using the computer. The distal femoral cutting guide was positioned using computer-assisted navigation so as to achieve a 5 distal valgus cut. The femur was sized and it was felt that a size 6 femoral component was appropriate. A size 6 femoral cutting guide was positioned and the anterior cut was performed and verified using the computer. This was followed by completion of the posterior and chamfer cuts. Femoral cutting guide for the central box was then positioned in the center box cut was performed.  Attention was then directed to the proximal tibia. Medial and lateral menisci were excised. The extramedullary tibial cutting guide was positioned using computer-assisted navigation so as to achieve a 0 varus-valgus alignment and 3 posterior slope. The cut was  performed and verified using the computer. The proximal tibia was  sized and it was felt that a size 8 tibial tray was appropriate. Tibial and femoral trials were inserted followed by insertion of a 10 mm polyethylene insert. This allowed for excellent mediolateral soft tissue balancing both in flexion and in full extension. Finally, the patella was cut and prepared so as to accommodate a 41 mm medialized dome patella. A patella trial was placed and the knee was placed through a range of motion with excellent patellar tracking appreciated. The femoral trial was removed after debridement of posterior osteophytes. The central post-hole for the tibial component was reamed followed by insertion of a keel punch. Tibial trials were then removed. Cut surfaces of bone were irrigated with copious amounts of normal saline with antibiotic solution using pulsatile lavage and then suctioned dry. Polymethylmethacrylate cement with gentamicin was prepared in the usual fashion using a vacuum mixer. Cement was applied to the cut surface of the proximal tibia as well as along the undersurface of a size 8 rotating platform tibial component. Tibial component was positioned and impacted into place. Excess cement was removed using [employer] fast streamer. Cement was then applied to the cut surfaces of the femur as well as along the posterior flanges of the size 6 femoral component. The femoral component was positioned and impacted into place. Excess cement was removed using [employer] fast streamer. A 10 mm polyethylene trial was inserted and the knee was brought into full extension with steady axial compression applied. Finally, cement was applied to the backside of a 41 mm medialized dome patella and the patellar component was positioned and patellar clamp applied. Excess cement was removed using [employer] fast streamer. After adequate curing of the cement, the tourniquet was deflated after a total tourniquet time of 121 minutes. Hemostasis was achieved using  electrocautery. The knee was irrigated with copious amounts of normal saline with antibiotic solution using pulsatile lavage and then suctioned dry. 20 mL of 1.3% Exparel and 60 mL of 0.25% Marcaine in 40 mL of normal saline was injected along the posterior capsule, medial and lateral gutters, and along the arthrotomy site. A 10 mm stabilized rotating platform polyethylene insert was inserted and the knee was placed through a range of motion with excellent mediolateral soft tissue balancing appreciated and excellent patellar tracking noted. 2 medium drains were placed in the wound bed and brought out through separate stab incisions. The medial parapatellar portion of the incision was reapproximated using interrupted sutures of #1 Vicryl. Subcutaneous tissue was approximated in layers using first #0 Vicryl followed #2-0 Vicryl. The skin was approximated with skin staples. A sterile dressing was applied.  The patient tolerated the procedure well and was transported to the recovery room in stable condition.    Kaileia Flow P. Holley Bouche., M.D.

## 2019-05-23 NOTE — Anesthesia Preprocedure Evaluation (Signed)
Anesthesia Evaluation  Patient identified by MRN, date of birth, ID band Patient awake    Reviewed: Allergy & Precautions, NPO status , Patient's Chart, lab work & pertinent test results  History of Anesthesia Complications Negative for: history of anesthetic complications  Airway Mallampati: II  TM Distance: >3 FB Neck ROM: Full    Dental no notable dental hx.    Pulmonary sleep apnea (noncompliant with CPAP) , neg COPD,    breath sounds clear to auscultation- rhonchi (-) wheezing      Cardiovascular hypertension, Pt. on medications (-) angina+ CAD, + Past MI and + Cardiac Stents (2019)  (-) CABG  Rhythm:Regular Rate:Normal - Systolic murmurs and - Diastolic murmurs    Neuro/Psych neg Seizures PSYCHIATRIC DISORDERS Depression negative neurological ROS     GI/Hepatic negative GI ROS, Neg liver ROS,   Endo/Other  diabetes, Oral Hypoglycemic Agents  Renal/GU negative Renal ROS     Musculoskeletal  (+) Arthritis ,   Abdominal (+) + obese,   Peds  Hematology negative hematology ROS (+)   Anesthesia Other Findings Past Medical History: No date: Arthritis of knee 04/28/2013: Bladder outflow obstruction No date: Coronary artery disease 01/10/2013: Decreased libido No date: Depression No date: DM (diabetes mellitus) ([place]) 01/10/2013: ED (erectile dysfunction) of organic origin 02/21/2013: Elevated prostate specific antigen (PSA) No date: Hemorrhoid No date: History of kidney stones No date: Hyperlipidemia No date: Hypertension 01/10/2013: Incomplete bladder emptying 04/28/2013: Malignant neoplasm of prostate [facility])     Comment:  prostate removed 2019: Myocardial infarction [facility])     Comment:  2 stents placed No date: Obstructive sleep apnea on CPAP     Comment:  does not use cpap, does not snore much since loosing 80               lbs   Reproductive/Obstetrics                              Lab Results  Component Value Date   WBC 5.6 05/17/2019   HGB 12.9 (L) 05/17/2019   HCT 40.0 05/17/2019   MCV 79.7 (L) 05/17/2019   PLT 287 05/17/2019    Anesthesia Physical Anesthesia Plan  ASA: III  Anesthesia Plan: Spinal   Post-op Pain Management:    Induction:   PONV Risk Score and Plan: 1 and Propofol infusion  Airway Management Planned: Natural Airway  Additional Equipment:   Intra-op Plan:   Post-operative Plan:   Informed Consent: I have reviewed the patients History and Physical, chart, labs and discussed the procedure including the risks, benefits and alternatives for the proposed anesthesia with the patient or authorized representative who has indicated his/her understanding and acceptance.     Dental advisory given  Plan Discussed with: CRNA and Anesthesiologist  Anesthesia Plan Comments:         Anesthesia Quick Evaluation

## 2019-05-23 NOTE — Anesthesia Procedure Notes (Signed)
Spinal  Patient location during procedure: OR Start time: 05/23/2019 11:10 AM End time: 05/23/2019 11:15 AM Staffing Performed: resident/CRNA  Resident/CRNA: Nelda Marseille, CRNA Preanesthetic Checklist Completed: patient identified, IV checked, site marked, risks and benefits discussed, surgical consent, monitors and equipment checked, pre-op evaluation and timeout performed Spinal Block Patient position: sitting Prep: Betadine Patient monitoring: heart rate, continuous pulse ox, blood pressure and cardiac monitor Approach: midline Location: L4-5 Injection technique: single-shot Needle Needle type: Whitacre and Introducer  Needle gauge: 24 G Needle length: 9 cm Additional Notes Negative paresthesia. Negative blood return. Positive free-flowing CSF. Expiration date of kit checked and confirmed. Patient tolerated procedure well, without complications.

## 2019-05-23 NOTE — Transfer of Care (Signed)
Immediate Anesthesia Transfer of Care Note  Patient: Daniel Bradley.  Procedure(s) Performed: COMPUTER ASSISTED TOTAL KNEE ARTHROPLASTY (Left Knee)  Patient Location: PACU  Anesthesia Type:Spinal  Level of Consciousness: sedated  Airway & Oxygen Therapy: Patient Spontanous Breathing and Patient connected to face mask oxygen  Post-op Assessment: Report given to RN and Post -op Vital signs reviewed and stable  Post vital signs: Reviewed and stable  Last Vitals:  Vitals Value Taken Time  BP 96/70 05/23/19 1511  Temp 35.9 C 05/23/19 1510  Pulse 77 05/23/19 1516  Resp 10 05/23/19 1516  SpO2 99 % 05/23/19 1516  Vitals shown include unvalidated device data.  Last Pain:  Vitals:   05/23/19 1027  TempSrc: Oral  PainSc: 0-No pain      Patients Stated Pain Goal: 1 ([id number])  Complications: No apparent anesthesia complications

## 2019-05-24 LAB — GLUCOSE, CAPILLARY
Glucose-Capillary: 148 mg/dL — ABNORMAL HIGH (ref 70–99)
Glucose-Capillary: 174 mg/dL — ABNORMAL HIGH (ref 70–99)
Glucose-Capillary: 136 mg/dL — ABNORMAL HIGH (ref 70–99)
Glucose-Capillary: 141 mg/dL — ABNORMAL HIGH (ref 70–99)

## 2019-05-24 MED ORDER — CHLORHEXIDINE GLUCONATE CLOTH 2 % EX PADS
6.0000 | MEDICATED_PAD | Freq: Every day | CUTANEOUS | Status: DC
  Administered 2019-05-24: 6 via TOPICAL

## 2019-05-24 NOTE — Evaluation (Signed)
Occupational Therapy Evaluation Patient Details Name: Daniel Bradley. MRN: [id number] DOB: January 03, 1956 Today's Date: 05/24/2019    History of Present Illness Daniel Bradley is a 57yoM who comes [facility] on 4/12 for elective Left TKA. Pt retired 2 years ago from [employer]. No prior joint replacement history. Fully independent community dwelling adult PTA, no prior DME use.   Clinical Impression   Pt seen for OT evaluation this date, POD#1 from above surgery. Pt was independent in all ADL prior to surgery and using a knee brace on L leg for mobility due to pain with functional mobility. Pt is eager to return to PLOF with less pain and improved safety and independence. Pt currently requires mod indep to supervision for LB dressing and bathing while in seated position due to pain and limited AROM of L knee. Pt instructed in polar care mgt, falls prevention strategies, home/routines modifications, DME/AE for LB bathing and dressing tasks, and compression stocking mgt. Handout provided to support recall and carryover. Pt verbalized understanding and denied additional skilled OT needs. OT in agreement. Pt reports girlfriend able to provide needed level of assist for polar care mgt and compression stocking mgt. Do not currently anticipate any OT needs following this hospitalization. Will sign off. Please re-consult if additional needs arise.     Follow Up Recommendations  No OT follow up    Equipment Recommendations  3 in 1 bedside commode    Recommendations for Other Services       Precautions / Restrictions Precautions Precautions: Fall;Knee Precaution Booklet Issued: Yes (comment) Restrictions Weight Bearing Restrictions: Yes LLE Weight Bearing: Weight bearing as tolerated      Mobility Bed Mobility Overal bed mobility: Modified Independent             General bed mobility comments: deferred, ambulating with PT upon arrival and in recliner at end of  session  Transfers Overall transfer level: Needs assistance Equipment used: Rolling walker (2 wheeled) Transfers: Sit to/from Stand Sit to Stand: Supervision         General transfer comment: PRN VC for RW mgt    Balance Overall balance assessment: Modified Independent                                         ADL either performed or assessed with clinical judgement   ADL Overall ADL's : Modified independent                                       General ADL Comments: Pt able to perform LB ADL tasks with supervision to Mod Indep. Will require some assist for compression stocking mgt and polar care mgt - pt reports girlfriend can provide needed level of assist     Vision Baseline Vision/History: Wears glasses Wears Glasses: Reading only Patient Visual Report: No change from baseline Vision Assessment?: No apparent visual deficits     Perception     Praxis      Pertinent Vitals/Pain Pain Assessment: 0-10 Pain Score: 8  Pain Location: Left operative knee Pain Descriptors / Indicators: Aching Pain Intervention(s): Limited activity within patient's tolerance;Monitored during session;Repositioned;Ice applied     Hand Dominance Right   Extremity/Trunk Assessment Upper Extremity Assessment Upper Extremity Assessment: Overall WFL for tasks assessed   Lower Extremity Assessment Lower  Extremity Assessment: LLE deficits/detail LLE Deficits / Details: expected post-op strength/ROM deficits   Cervical / Trunk Assessment Cervical / Trunk Assessment: Normal   Communication Communication Communication: No difficulties   Cognition Arousal/Alertness: Awake/alert Behavior During Therapy: WFL for tasks assessed/performed Overall Cognitive Status: Within Functional Limits for tasks assessed                                 General Comments: min VC for RW safety/mgt   General Comments       Exercises Total Joint  Exercises Ankle Circles/Pumps: AROM;Both;15 reps;Supine Quad Sets: AROM;15 reps;Left;Supine Short Arc Quad: AROM;Left;15 reps;Supine Heel Slides: AAROM;Left;15 reps;Supine Hip ABduction/ADduction: AROM;Left;15 reps;Supine Straight Leg Raises: AROM;5 reps;Left;Supine Goniometric ROM: Left knee P/ROM: 4-96 degrees (polar care still under dressings) Other Exercises Other Exercises: Pt instructed in polar care mgt, falls prevention strategies, RW mgt, AE/DME for ADL, and compression stocking mgt - handout provided to support recall and carryover - pt verbalized understanding   Shoulder Instructions      Home Living Family/patient expects to be discharged to:: Private residence Living Arrangements: Spouse/significant other(girlfriend) Available Help at Discharge: Family;Available 24 hours/day;Available PRN/intermittently(gf available 24/7 for a few days and can take time off work if needed) Type of Home: House Home Access: Level entry     Home Layout: Two level;Bed/bath upstairs;1/2 bath on main level Alternate Level Stairs-Number of Steps: recliner on first floor   Bathroom Shower/Tub: Walk-in shower         Home Equipment: None;Shower seat - built in          Prior Functioning/Environment Level of Independence: Independent        Comments: Now retired; worked in Theatre manager for [employer], no falls, indep with ADL, driving, med [employer]        OT Problem List: Pain;Decreased range of motion;Decreased strength      OT Treatment/Interventions:      OT Goals(Current goals can be found in the care plan section) Acute Rehab OT Goals Patient Stated Goal: Return to home independently OT Goal Formulation: All assessment and education complete, DC therapy  OT Frequency:     Barriers to D/C:            Co-evaluation              AM-PAC OT "6 Clicks" Daily Activity     Outcome Measure Help from another person eating meals?: None Help from another person  taking care of personal grooming?: None Help from another person toileting, which includes using toliet, bedpan, or urinal?: None Help from another person bathing (including washing, rinsing, drying)?: A Little Help from another person to put on and taking off regular upper body clothing?: None Help from another person to put on and taking off regular lower body clothing?: A Little 6 Click Score: 22   End of Session Equipment Utilized During Treatment: Rolling walker Nurse Communication: Patient requests pain meds  Activity Tolerance: Patient tolerated treatment well Patient left: in chair;with call bell/phone within reach;with chair alarm set;with SCD's reapplied;Other (comment)(polar care in place)  OT Visit Diagnosis: Other abnormalities of gait and mobility (R26.89);Pain Pain - Right/Left: Left Pain - part of body: Knee                Time: [id number] OT Time Calculation (min): 21 min Charges:  OT General Charges $OT Visit: 1 Visit OT Evaluation $OT Eval Low Complexity: 1 Low $OT Eval  Moderate Complexity: 1 Mod OT Treatments $Self Care/Home Management : 8-22 mins  Jeni Salles, MPH, MS, OTR/L ascom [phone] 05/24/19, 9:55 AM

## 2019-05-24 NOTE — Progress Notes (Signed)
Physical Therapy Treatment Patient Details Name: Daniel Bradley. MRN: [id number] DOB: October 22, 1955 Today's Date: 05/24/2019    History of Present Illness Daniel Bradley is a 19yoM who comes to [facility] on 4/12 for elective Left TKA. Pt retired 2 years ago from [employer]. No prior joint replacement history. Fully independent community dwelling adult PTA, no prior DME use.    PT Comments    Pt still in recliner upon entry, lunch completed. Pain 2/10. Pt moving well ad lib, taking breaks from static TKE and moving limbs ad lib in chair. Reviewed chair based HEP Bradley patient. Supervision for transfers and AMB Bradley RW, intermittent cues for line/lead safety with RW. Pt's ease of quads activation, large ROM, and facility of LAQ movement are all really quite perplexing. Pt has met all PT goals at this time, may review some stairs training tomorrow, but not mandatory for DC to home. Pt will have most difficulty with adhering to details of HEP.     Follow Up Recommendations  Follow surgeon's recommendation for DC plan and follow-up therapies;Supervision - Intermittent     Equipment Recommendations  3in1 (PT);Rolling walker with 5" wheels    Recommendations for Other Services       Precautions / Restrictions Precautions Precautions: Fall;Knee Precaution Booklet Issued: Yes (comment) Restrictions Weight Bearing Restrictions: Yes LLE Weight Bearing: Weight bearing as tolerated    Mobility  Bed Mobility               General bed mobility comments: in recliner at entry, and at EOS  Transfers Overall transfer level: Needs assistance Equipment used: Rolling walker (2 wheeled) Transfers: Sit to/from Stand Sit to Stand: Supervision         General transfer comment: VC for RW use  Ambulation/Gait Ambulation/Gait assistance: Supervision Gait Distance (Feet): 200 Feet Assistive device: Rolling walker (2 wheeled) Gait Pattern/deviations: Step-through pattern Gait velocity:  0.32ms; 0.461m in AM   General Gait Details: still some buckling, no assist needed- 2-point gait.   Stairs             Wheelchair Mobility    Modified Rankin (Stroke Patients Only)       Balance Overall balance assessment: Modified Independent                                          Cognition Arousal/Alertness: Awake/alert Behavior During Therapy: WFL for tasks assessed/performed Overall Cognitive Status: Within Functional Limits for tasks assessed                                 General Comments: min VC for RW safety/mgt      Exercises Total Joint Exercises Ankle Circles/Pumps: AROM;Both;15 reps;Supine Heel Slides: AAROM;Left;Supine;5 reps;Limitations Heel Slides Limitations: educated on self assist Bradley sheet Long Arc Quad: AROM;Left;10 reps Knee Flexion: AROM;Left;10 reps Other Exercises Other Exercises: Pt instructed in polar care mgt, falls prevention strategies, RW mgt, AE/DME for ADL, and compression stocking mgt - handout provided to support recall and carryover - pt verbalized understanding    General Comments        Pertinent Vitals/Pain Pain Assessment: 0-10 Pain Score: 2  Pain Location: Left operative knee Pain Descriptors / Indicators: Aching Pain Intervention(s): Limited activity within patient's tolerance;Monitored during session;Premedicated before session    Home Living Family/patient expects to be  discharged to:: Private residence Living Arrangements: Spouse/significant other(girlfriend) Available Help at Discharge: Family;Available 24 hours/day;Available PRN/intermittently(gf available 24/7 for a few days and can take time off work if needed) Type of Home: House Home Access: Level entry   Home Layout: Two level;Bed/bath upstairs;1/2 bath on main level Home Equipment: None;Shower seat - built in      Prior Function Level of Independence: Independent      Comments: Now retired; worked in Theatre manager  for [employer], 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, indep with ADL, driving, med [employer]   PT Goals (current goals can now be found in the care plan section) Acute Rehab PT Goals Patient Stated Goal: Return to home independently PT Goal Formulation: With patient Time For Goal Achievement: 06/07/19 Potential to Achieve Goals: Good Progress towards PT goals: Progressing toward goals    Frequency    BID      PT Plan Current plan remains appropriate    Co-evaluation              AM-PAC PT "6 Clicks" Mobility   Outcome Measure  Help needed turning from your back to your side while in a flat bed without using bedrails?: None Help needed moving from lying on your back to sitting on the side of a flat bed without using bedrails?: None Help needed moving to and from a bed to a chair (including a wheelchair)?: A Little Help needed standing up from a chair using your arms (e.g., wheelchair or bedside chair)?: A Little Help needed to walk in hospital room?: A Little Help needed climbing 3-5 steps with a railing? : A Little 6 Click Score: 20    End of Session Equipment Utilized During Treatment: Gait belt Activity Tolerance: Patient tolerated treatment well;No increased pain Patient left: in chair;with call bell/phone within reach;with nursing/sitter in room Nurse Communication: Mobility status PT Visit Diagnosis: Difficulty in walking, not elsewhere classified (R26.2);Other abnormalities of gait and mobility (R26.89)     Time: 4353-9122 PT Time Calculation (min) (ACUTE ONLY): 23 min  Charges:  $Gait Training: 8-22 mins $Therapeutic Exercise: 8-22 mins                     1:40 PM, 05/24/19 Daniel Bradley, PT, DPT Physical Therapist - [facility]  [phone] ([place])    Daniel Bradley 05/24/2019, 1:37 PM

## 2019-05-24 NOTE — Evaluation (Signed)
Physical Therapy Evaluation Patient Details Name: Daniel Bradley. MRN: [id number] DOB: 07-Mar-1955 Today's Date: 05/24/2019   History of Present Illness  Daniel Bradley is a 15yoM who comes to [facility] on 4/12 for elective Left TKA. Pt retired 2 years ago from [employer]. No prior joint replacement history. Fully independent community dwelling adult PTA, no prior DME use.  Clinical Impression  Pt admitted with above diagnosis. Pt currently with functional limitations due to the deficits listed below (see "PT Problem List"). Upon entry, pt in bed, awake and agreeable to participate. The pt is alert and oriented x4, pleasant, conversational, and generally a good historian. HEP education commenced, minimal to zero physical assist needed, mostly tactile cues. Modified indepednent bed mobility, supervision level transfers with RW and moderate effort. AMB at supervision level for safety as pt needs cues a few times for safety awareness. AMB limited at 145ft by author, but anticipate easily moving twice this distance later in day. ROM looking above average for this time frame, 4-96 degrees.  Functional mobility assessment demonstrates increased effort/time requirements, poor tolerance, and need for physical assistance, whereas the patient performed these at a higher level of independence PTA. Pt will benefit from skilled PT intervention to increase independence and safety with basic mobility in preparation for discharge to the venue listed below.       Follow Up Recommendations Follow surgeon's recommendation for DC plan and follow-up therapies;Supervision - Intermittent    Equipment Recommendations  3in1 (PT);Rolling walker with 5" wheels    Recommendations for Other Services       Precautions / Restrictions Precautions Precautions: Fall;Knee Restrictions LLE Weight Bearing: Weight bearing as tolerated      Mobility  Bed Mobility Overal bed mobility: Modified Independent                 Transfers Overall transfer level: Needs assistance Equipment used: Rolling walker (2 wheeled) Transfers: Sit to/from Stand Sit to Stand: Supervision            Ambulation/Gait Ambulation/Gait assistance: Supervision Gait Distance (Feet): 120 Feet Assistive device: Rolling walker (2 wheeled) Gait Pattern/deviations: Step-through pattern Gait velocity: 0.74m/s   General Gait Details: some inittial buckling of Left knee; cues secveral times to not lift RW and to attend to woudn drain for safety. Pt moving quite well, easily could have gone farther, distance limited in prudence as this is the first time up walking.  Stairs            Wheelchair Mobility    Modified Rankin (Stroke Patients Only)       Balance Overall balance assessment: Modified Independent                                           Pertinent Vitals/Pain Pain Assessment: 0-10 Pain Score: 7  Pain Location: Left operative knee Pain Intervention(s): Limited activity within patient's tolerance;Monitored during session    [place] expects to be discharged to:: Private residence Living Arrangements: Spouse/significant other(girlfriend) Available Help at Discharge: Family Type of Home: House Home Access: Level entry     Home Layout: Two level;Bed/bath upstairs;1/2 bath on main level Home Equipment: None;Shower seat - built in      Prior Function Level of Independence: Independent         Comments: Now retired; worked in Theatre manager for [employer].  Hand Dominance   Dominant Hand: Right    Extremity/Trunk Assessment                Communication   Communication: No difficulties  Cognition Arousal/Alertness: Awake/alert Behavior During Therapy: WFL for tasks assessed/performed Overall Cognitive Status: Within Functional Limits for tasks assessed                                        General Comments       Exercises Total Joint Exercises Ankle Circles/Pumps: AROM;Both;15 reps;Supine Quad Sets: AROM;15 reps;Left;Supine Short Arc Quad: AROM;Left;15 reps;Supine Heel Slides: AAROM;Left;15 reps;Supine Hip ABduction/ADduction: AROM;Left;15 reps;Supine Straight Leg Raises: AROM;5 reps;Left;Supine Goniometric ROM: Left knee P/ROM: 4-96 degrees (polar care still under dressings)   Assessment/Plan    PT Assessment Patient needs continued PT services  PT Problem List Decreased range of motion;Decreased activity tolerance;Decreased balance;Decreased mobility       PT Treatment Interventions DME instruction;Balance training;Gait training;Stair training;Functional mobility training;Therapeutic activities;Therapeutic exercise;Patient/family education    PT Goals (Current goals can be found in the Care Plan section)  Acute Rehab PT Goals Patient Stated Goal: Return to home independently PT Goal Formulation: With patient Time For Goal Achievement: 06/07/19 Potential to Achieve Goals: Good    Frequency BID   Barriers to discharge        Co-evaluation               AM-PAC PT "6 Clicks" Mobility  Outcome Measure Help needed turning from your back to your side while in a flat bed without using bedrails?: None Help needed moving from lying on your back to sitting on the side of a flat bed without using bedrails?: None Help needed moving to and from a bed to a chair (including a wheelchair)?: A Little Help needed standing up from a chair using your arms (e.g., wheelchair or bedside chair)?: A Little Help needed to walk in hospital room?: A Little Help needed climbing 3-5 steps with a railing? : A Little 6 Click Score: 20    End of Session Equipment Utilized During Treatment: Gait belt Activity Tolerance: Patient tolerated treatment well;No increased pain Patient left: in chair;with call bell/phone within reach;with nursing/sitter in room Nurse Communication: Mobility status PT  Visit Diagnosis: Difficulty in walking, not elsewhere classified (R26.2);Other abnormalities of gait and mobility (R26.89)    Time: [id number] PT Time Calculation (min) (ACUTE ONLY): 41 min   Charges:   PT Evaluation $PT Eval Low Complexity: 1 Low PT Treatments $Gait Training: 8-22 mins $Therapeutic Exercise: 8-22 mins        9:30 AM, 05/24/19 Etta Grandchild, PT, DPT Physical Therapist - [facility]  [phone] ([place])    [place] C 05/24/2019, 9:27 AM

## 2019-05-24 NOTE — Anesthesia Postprocedure Evaluation (Signed)
Anesthesia Post Note  Patient: Daniel Bradley.  Procedure(s) Performed: COMPUTER ASSISTED TOTAL KNEE ARTHROPLASTY (Left Knee)  Patient location during evaluation: Nursing Unit Anesthesia Type: Spinal Level of consciousness: oriented and awake and alert Pain management: pain level controlled Vital Signs Assessment: post-procedure vital signs reviewed and stable Respiratory status: spontaneous breathing and respiratory function stable Cardiovascular status: blood pressure returned to baseline and stable Postop Assessment: no headache, no backache, no apparent nausea or vomiting and patient able to bend at knees Anesthetic complications: no     Last Vitals:  Vitals:   05/24/19 0423 05/24/19 0754  BP: 124/66 (!) 147/87  Pulse: 79 73  Resp: 17 16  Temp: 36.9 C (!) 36.3 C  SpO2: 97% 96%    Last Pain:  Vitals:   05/24/19 0754  TempSrc: Oral  PainSc:                  [name]

## 2019-05-24 NOTE — TOC Initial Note (Signed)
Transition of Care (TOC) - Initial/Assessment Note    Patient Details  Name: Daniel Bradley. MRN: [id number] Date of Birth: [birth year]  [facility]) CM/SW Contact:    Elease Hashimoto, LCSW Phone Number: 05/24/2019, 9:54 AM  Clinical Narrative:  Met with pt who is here for a TKR. He lives with his girlfriend-April and they are both independent and drive. Pt is retired as of 2019 and goes to the gym to keep himself active. He is hopeful he will do well and be able to go home tomorrow. He has no equipment he used prior to admission. Made aware [facility] to follow at DC. Will await equipment needs and follow along.                 Expected Discharge Plan: [place] Barriers to Discharge: Continued Medical Work up   Patient Goals and CMS Choice Patient states their goals for this hospitalization and ongoing recovery are:: I am hopeful to do well and go home tomorrow   Choice offered to / list presented to : Patient  Expected Discharge Plan and Services Expected Discharge Plan: [place] In-house Referral: Clinical Social Work   Post Acute Care Choice: Home Health, Durable Medical Equipment Living arrangements for the past 2 months: Single Family Home                 DME Arranged: Walker rolling DME Agency: [employer] Date DME Agency Contacted: 05/24/19 Time DME Agency Contacted: [phone] Representative spoke with at DME Agency: [place]: PT [place]: [facility] ([facility]) Date [place]: 05/24/19 Time [place]: [phone] Representative spoke with at [place]: Helene Kelp  Prior Living Arrangements/Services Living arrangements for the past 2 months: [place] with:: Significant Other Patient language and need for interpreter reviewed:: No Do you feel safe going back to the place where you live?: Yes      Need for Family Participation in Patient Care: Yes (Comment) Care giver  support system in place?: Yes (comment)   Criminal Activity/Legal Involvement Pertinent to Current Situation/Hospitalization: No - Comment as needed  Activities of Daily Living Home Assistive Devices/Equipment: Dentures (specify type), CPAP, Blood pressure cuff, CBG Meter, Eyeglasses ADL Screening (condition at time of admission) Patient's cognitive ability adequate to safely complete daily activities?: No Is the patient deaf or have difficulty hearing?: No Does the patient have difficulty seeing, even when wearing glasses/contacts?: No Does the patient have difficulty concentrating, remembering, or making decisions?: No Patient able to express need for assistance with ADLs?: Yes Does the patient have difficulty dressing or bathing?: No Independently performs ADLs?: Yes (appropriate for developmental age) Does the patient have difficulty walking or climbing stairs?: Yes Weakness of Legs: Left Weakness of Arms/Hands: None  Permission Sought/Granted Permission sought to share information with : Family Supports Permission granted to share information with : Yes, Verbal Permission Granted  Share Information with NAME: April  Permission granted to share info w AGENCY: [facility]  Permission granted to share info w Relationship: Grilfriend     Emotional Assessment Appearance:: Appears stated age Attitude/Demeanor/Rapport: Engaged, Charismatic Affect (typically observed): Adaptable, Accepting Orientation: : Oriented to Self, Oriented to Place, Oriented to  Time, Oriented to Situation   Psych Involvement: No (comment)  Admission diagnosis:  Total knee replacement status [Z96.659] Patient Active Problem List   Diagnosis Date Noted  . Total knee replacement status 05/23/2019  . Primary osteoarthritis  of left knee 03/20/2019  . Diabetes mellitus ([place]) 09/09/2018  . Essential hypertension 09/09/2018  . 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 Other hyperlipidemia 09/09/2018  . Impacted cerumen of left ear 09/09/2018  . Class 2 severe obesity due to excess calories with serious comorbidity and body mass index (BMI) of 39.0 to 39.9 in adult ([place]) 09/09/2018  . Non-STEMI (non-ST elevated myocardial infarction) ([place]) 08/19/2017  . 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 Malignant neoplasm of prostate ([place]) 04/28/2013  . Elevated prostate specific antigen (PSA) 02/21/2013  . Decreased libido 01/10/2013  . 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 Incomplete bladder emptying 01/10/2013   PCP:  Patient, No Pcp Per Pharmacy:   [id number] [address], [address] [place] [address] Phone: [phone] Fax: [phone]     Social Determinants of Health (SDOH) Interventions    Readmission Risk Interventions No flowsheet data found.

## 2019-05-24 NOTE — Progress Notes (Signed)
  Subjective: 1 Day Post-Op Procedure(s) (LRB): COMPUTER ASSISTED TOTAL KNEE ARTHROPLASTY (Left) Patient reports pain as 7 on 0-10 scale.   Patient is well, and has had no acute complaints or problems Plan is to go Home after hospital stay. Negative for chest pain and shortness of breath Fever: no Gastrointestinal: negative for nausea and vomiting.   Patient has not had a bowel movement.  Patient states he asked to go to the bathroom last night and was directed to use a bedpan.   Objective: Vital signs in last 24 hours: Temp:  [96.7 F (35.9 C)-98.6 F (37 C)] 97.4 F (36.3 C) (04/13 0754) Pulse Rate:  [72-92] 73 (04/13 0754) Resp:  [7-18] 16 (04/13 0754) BP: (96-147)/(66-87) 147/87 (04/13 0754) SpO2:  [94 %-100 %] 96 % (04/13 0754) Weight:  [135.2 kg] 135.2 kg (04/12 1027)  Intake/Output from previous day:  Intake/Output Summary (Last 24 hours) at 05/24/2019 0805 Last data filed at 05/24/2019 0608 Gross per 24 hour  Intake 1800 ml  Output 2150 ml  Net -350 ml    Intake/Output this shift: No intake/output data recorded.  Labs: No results for input(s): HGB in the last 72 hours. No results for input(s): WBC, RBC, HCT, PLT in the last 72 hours. No results for input(s): NA, K, CL, CO2, BUN, CREATININE, GLUCOSE, CALCIUM in the last 72 hours. No results for input(s): LABPT, INR in the last 72 hours.   EXAM General - Patient is Alert, Appropriate and Oriented Extremity - Neurovascular intact Dorsiflexion/Plantar flexion intact Compartment soft Dressing/Incision -Postoperative dressing remains in place., Polar Care in place and working. , Hemovac in place.  Motor Function - intact, moving foot and toes well on exam. Able to perform SLR. Cardiovascular- Regular rate and rhythm, no murmurs/rubs/gallops Respiratory- Lungs clear to auscultation bilaterally Gastrointestinal- soft, nontender and active bowel sounds   Assessment/Plan: 1 Day Post-Op Procedure(s)  (LRB): COMPUTER ASSISTED TOTAL KNEE ARTHROPLASTY (Left) Active Problems:   Total knee replacement status  Estimated body mass index is 39.32 kg/m as calculated from the following:   Height as of this encounter: 6\' 1"  (1.854 m).   Weight as of this encounter: 135.2 kg. Advance diet Up with therapy Plan for discharge tomorrow    DVT Prophylaxis - Lovenox, Ted hose and foot pumps Weight-Bearing as tolerated to left leg  Cassell Smiles, PA-C [facility] 05/24/2019, 8:05 AM

## 2019-05-25 LAB — GLUCOSE, CAPILLARY
Glucose-Capillary: 125 mg/dL — ABNORMAL HIGH (ref 70–99)
Glucose-Capillary: 117 mg/dL — ABNORMAL HIGH (ref 70–99)

## 2019-05-25 MED ORDER — TRAMADOL HCL 50 MG PO TABS: 50.0000 mg | ORAL_TABLET | ORAL | 0 refills | Status: DC | PRN

## 2019-05-25 MED ORDER — OXYCODONE HCL 5 MG PO TABS: 5.0000 mg | ORAL_TABLET | ORAL | 0 refills | Status: DC | PRN

## 2019-05-25 MED ORDER — ENOXAPARIN SODIUM 40 MG/0.4ML ~~LOC~~ SOLN: 40.0000 mg | SUBCUTANEOUS | 0 refills | Status: DC

## 2019-05-25 MED ORDER — CELECOXIB 200 MG PO CAPS: 200.0000 mg | ORAL_CAPSULE | Freq: Two times a day (BID) | ORAL | 0 refills | Status: DC

## 2019-05-25 NOTE — TOC Transition Note (Signed)
Transition of Care [facility]) - CM/SW Discharge Note   Patient Details  Name: Daniel Bradley. MRN: [id number] Date of Birth: 1955/10/09  Transition of Care [facility]) CM/SW Contact:  Elease Hashimoto, LCSW Phone Number: 05/25/2019, 8:00 AM   Clinical Narrative:   Pt feels good and ready for discharge today. Equipment received and April-girlfriend to transport home via car. Needs met no further follow due to discharge today. [facility] aware of discharge and will contact pt to set up appointments.    Final next level of care: Home w Home Health Services Barriers to Discharge: Barriers Resolved   Patient Goals and CMS Choice Patient states their goals for this hospitalization and ongoing recovery are:: I am hopeful to do well and go home tomorrow   Choice offered to / list presented to : Patient  Discharge Placement                Patient to be transferred to facility by: April via car Name of family member notified: April Patient and family notified of of transfer: 05/25/19  Discharge Plan and Services In-house Referral: Clinical Social Work   Post Acute Care Choice: Home Health, Durable Medical Equipment          DME Arranged: [place] Rile rolling DME Agency: [employer] Date DME Agency Contacted: 05/24/19 Time DME Agency Contacted: [phone] Representative spoke with at DME Agency: [place]: PT [place]: [facility] Date [place]: 05/24/19 Time [place]: [phone] Representative spoke with at [place]: [place] ([place]) Interventions     Readmission Risk Interventions No flowsheet data found.

## 2019-05-25 NOTE — Plan of Care (Signed)
  Problem: Education: Goal: Knowledge of the prescribed therapeutic regimen will improve Outcome: Progressing   Problem: Activity: Goal: Ability to avoid complications of mobility impairment will improve Outcome: Progressing   Problem: Clinical Measurements: Goal: Postoperative complications will be avoided or minimized Outcome: Progressing Note: No complications noted this shift

## 2019-05-25 NOTE — Progress Notes (Signed)
Physical Therapy Treatment Patient Details Name: Daniel Bradley. MRN: [id number] DOB: May 12, 1955 Today's Date: 05/25/2019    History of Present Illness Takashi Devine Klingel is a 2yoM who comes to [facility] on 4/12 for elective Left TKA. Pt retired 2 years ago from [employer]. No prior joint replacement history. Fully independent community dwelling adult PTA, no prior DME use.    PT Comments    Pt up in chair upon arrival, agreeable to PT session. Pain advanced from previous day, now 6/10, but does not appear to detract from quality or tolerance of session objectively. Reviewed supine HEP in full, extensive multimodal cues needed, but pt does not appear to learn these quicker with reference out handout. Pt educated on knee precautions, heel not elevated at entry. Pt able to progress AMB 15f this date (2873f, gait speed progressed 3% from yesterday. Transferred performed with modified independence. Pt has no knee buckling this date. Pt has met all PT goals at this time.    Follow Up Recommendations  Follow surgeon's recommendation for DC plan and follow-up therapies;Supervision - Intermittent     Equipment Recommendations  3in1 (PT);Rolling walker with 5" wheels    Recommendations for Other Services       Precautions / Restrictions Precautions Precautions: Fall;Knee Precaution Booklet Issued: Yes (comment) Restrictions Weight Bearing Restrictions: Yes LLE Weight Bearing: Weight bearing as tolerated    Mobility  Bed Mobility               General bed mobility comments: in recliner at entry, and at EOS  Transfers Overall transfer level: Needs assistance Equipment used: Rolling walker (2 wheeled) Transfers: Sit to/from Stand Sit to Stand: Modified independent (Device/Increase time)            Ambulation/Gait Ambulation/Gait assistance: Supervision Gait Distance (Feet): 280 Feet Assistive device: Rolling walker (2 wheeled) Gait Pattern/deviations:  Step-through pattern Gait velocity: 0.5335mPOD2 (on 4/14 POD1- 0.21m22mn PM; 0.64m/51m AM)   General Gait Details: 2-point step-through gait, no buckling this date   Stairs Stairs: (not necessary, deferred)           Wheelchair Mobility    Modified Rankin (Stroke Patients Only)       Balance Overall balance assessment: Modified Independent                                          Cognition Arousal/Alertness: Awake/alert Behavior During Therapy: WFL for tasks assessed/performed Overall Cognitive Status: Within Functional Limits for tasks assessed                                        Exercises Total Joint Exercises Ankle Circles/Pumps: AROM;Both;15 reps;Supine Quad Sets: AROM;Left;Supine;10 reps;Limitations Quad Sets Limitations: some lag in quads, difficulty with activation. Short Arc Quad: AROM;Left;15 reps;Supine Heel Slides: AAROM;Left;Supine;Limitations;15 reps Heel Slides Limitations: educated on self assist c sheet Hip ABduction/ADduction: AROM;Left;15 reps;Supine Straight Leg Raises: AROM;Left;Supine;10 reps Goniometric ROM: Left knee P/ROM: 16-107 degrees    General Comments        Pertinent Vitals/Pain Pain Assessment: 0-10 Pain Score: 6  Pain Location: Left operative knee Pain Descriptors / Indicators: Aching Pain Intervention(s): Limited activity within patient's tolerance;Monitored during session;Premedicated before session    Home Living  Prior Function            PT Goals (current goals can now be found in the care plan section) Acute Rehab PT Goals Patient Stated Goal: Return to home independently PT Goal Formulation: With patient Time For Goal Achievement: 06/07/19 Potential to Achieve Goals: Good Progress towards PT goals: Progressing toward goals    Frequency    BID      PT Plan Current plan remains appropriate    Co-evaluation               AM-PAC PT "6 Clicks" Mobility   Outcome Measure  Help needed turning from your back to your side while in a flat bed without using bedrails?: None Help needed moving from lying on your back to sitting on the side of a flat bed without using bedrails?: None Help needed moving to and from a bed to a chair (including a wheelchair)?: A Little Help needed standing up from a chair using your arms (e.g., wheelchair or bedside chair)?: A Little Help needed to walk in hospital room?: A Little Help needed climbing 3-5 steps with a railing? : A Little 6 Click Score: 20    End of Session Equipment Utilized During Treatment: Gait belt Activity Tolerance: Patient tolerated treatment well;No increased pain Patient left: in chair;with call bell/phone within reach;with nursing/sitter in room Nurse Communication: Mobility status PT Visit Diagnosis: Difficulty in walking, not elsewhere classified (R26.2);Other abnormalities of gait and mobility (R26.89)     Time: 5625-6389 PT Time Calculation (min) (ACUTE ONLY): 23 min  Charges:  $Gait Training: 8-22 mins $Therapeutic Exercise: 8-22 mins                     12:23 PM, 05/25/19 Etta Grandchild, PT, DPT Physical Therapist - [facility]  [phone] ([place])    Yakelin Grenier C 05/25/2019, 12:19 PM

## 2019-05-25 NOTE — Progress Notes (Signed)
Pt ready for discharge home today per MD. Pt had BM and met PT goals. Patient assessment unchanged from this morning. Reviewed discharge instructions and prescriptions with pt and his significant other; all questions answered and pt verbalized understanding. Extra honeycomb dressings provided. PIV removed, VSS. Pt assisted to car via NT.   Daniel Bradley

## 2019-05-25 NOTE — Progress Notes (Signed)
  Subjective: 2 Days Post-Op Procedure(s) (LRB): COMPUTER ASSISTED TOTAL KNEE ARTHROPLASTY (Left) Patient reports pain as 6 on 0-10 scale.   Patient is well, and has had no acute complaints or problems Plan is to go Home after hospital stay. Negative for chest pain and shortness of breath Fever: no Gastrointestinal: negative for nausea and vomiting.   Patient has not had a bowel movement.  Objective: Vital signs in last 24 hours: Temp:  [97.4 F (36.3 C)-98.5 F (36.9 C)] 98.1 F (36.7 C) (04/14 0133) Pulse Rate:  [59-73] 63 (04/14 0133) Resp:  [16-20] 17 (04/14 0133) BP: (103-147)/(62-102) 120/73 (04/14 0133) SpO2:  [96 %-98 %] 98 % (04/14 0133)  Intake/Output from previous day:  Intake/Output Summary (Last 24 hours) at 05/25/2019 0657 Last data filed at 05/25/2019 0640 Gross per 24 hour  Intake 2308.59 ml  Output 1210 ml  Net 1098.59 ml    Intake/Output this shift: Total I/O In: -  Out: 810 [Urine:600; Drains:210]  Labs: No results for input(s): HGB in the last 72 hours. No results for input(s): WBC, RBC, HCT, PLT in the last 72 hours. No results for input(s): NA, K, CL, CO2, BUN, CREATININE, GLUCOSE, CALCIUM in the last 72 hours. No results for input(s): LABPT, INR in the last 72 hours.   EXAM General - Patient is Alert, Appropriate and Oriented Extremity - Neurovascular intact Dorsiflexion/Plantar flexion intact Compartment soft Dressing/Incision -Postoperative dressing remains in place., Polar Care in place and working. , Hemovac in place. , no sanguinous drainage noted  Motor Function - intact, moving foot and toes well on exam.  Cardiovascular- Regular rate and rhythm, no murmurs/rubs/gallops Respiratory- Lungs clear to auscultation bilaterally Gastrointestinal- soft, nontender and active bowel sounds   Assessment/Plan: 2 Days Post-Op Procedure(s) (LRB): COMPUTER ASSISTED TOTAL KNEE ARTHROPLASTY (Left) Active Problems:   Total knee replacement  status  Estimated body mass index is 39.32 kg/m as calculated from the following:   Height as of this encounter: 6\' 1"  (1.854 m).   Weight as of this encounter: 135.2 kg. Advance diet Up with therapy Discharge home with home health  Hemovac removed, post-op dressing removed   DVT Prophylaxis - Lovenox, Ted hose and foot pumps Weight-Bearing as tolerated to left leg  Cassell Smiles, PA-C [facility] Orthopaedic Surgery 05/25/2019, 6:57 AM

## 2019-05-25 NOTE — Discharge Summary (Signed)
Physician Discharge Summary  Patient ID: Daniel Bradley. MRN: [id number] DOB/AGE: [birth year] 64 y.o.  Admit date: 05/23/2019 Discharge date: 05/25/2019  Admission Diagnoses:  Total knee replacement status [Z96.659]  Surgeries:Procedure(s): Daniel Smiles, PA-C (present and scrubbed throughout the case, critical for assistance with exposure, retraction, instrumentation, and closure)  ANESTHESIA: spinal  ESTIMATED BLOOD LOSS: 50 mL  FLUIDS REPLACED: 1700 mL of crystalloid  TOURNIQUET TIME: 121 minutes  DRAINS: 2 medium Hemovac drains  SOFT TISSUE RELEASES: Anterior cruciate ligament, posterior cruciate ligament, deep medial collateral ligament, patellofemoral ligament  IMPLANTS UTILIZED: DePuy Attune size 6 posterior stabilized femoral component (cemented), size 8 rotating platform tibial component (cemented), 41 mm medialized dome patella (cemented), and a 10 mm stabilized rotating platform polyethylene insert.  Discharge Diagnoses: Patient Active Problem List   Diagnosis Date Noted  . Total knee replacement status 05/23/2019  . Primary osteoarthritis of left knee 03/20/2019  . Diabetes mellitus ([place]) 09/09/2018  . Essential hypertension 09/09/2018  . Coronary artery disease involving coronary bypass graft of native heart without angina pectoris 09/09/2018  . Other hyperlipidemia 09/09/2018  . Impacted cerumen of left ear 09/09/2018  . Class 2 severe obesity due to excess calories with serious comorbidity and body mass index (BMI) of 39.0 to 39.9 in adult ([place]) 09/09/2018  . Non-STEMI (non-ST elevated myocardial infarction) ([place]) 08/19/2017  . 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 Malignant neoplasm of prostate ([place]) 04/28/2013  . Elevated prostate specific antigen (PSA) 02/21/2013  . Decreased libido 01/10/2013  . 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 Incomplete bladder emptying 01/10/2013    Past Medical History:  Diagnosis Date  .  Arthritis of knee   . 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 Coronary artery disease   . Decreased libido 01/10/2013  . Depression   . DM (diabetes mellitus) ([place])   . 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 Elevated prostate specific antigen (PSA) 02/21/2013  . Hemorrhoid   . History of kidney stones   . Hyperlipidemia   . Hypertension   . Incomplete bladder emptying 01/10/2013  . Malignant neoplasm of prostate ([place]) 04/28/2013   prostate removed  . Myocardial infarction ([place]) 2019   2 stents placed  . Obstructive sleep apnea on CPAP    does not use cpap, does not snore much since loosing 80 lbs     Transfusion:    Consultants (if any):   Discharged Condition: Improved  Hospital Course: Daniel Elks. is an 64 y.o. male who was admitted 05/23/2019 with a diagnosis of left knee osteoarthritis and went to the operating room on 05/23/2019 and underwent left total knee arthroplasty. The patient received perioperative antibiotics for prophylaxis (see below). The patient tolerated the procedure well and was transported to PACU in stable condition. After meeting PACU criteria, the patient was subsequently transferred to the Orthopaedics/Rehabilitation unit.   The patient received DVT prophylaxis in the form of early mobilization, Lovenox, Foot Pumps and TED hose. A sacral pad had been placed and heels were elevated off of the bed with rolled towels in order to protect skin integrity. Foley catheter was discontinued on postoperative day #0. Wound drains were discontinued on postoperative day #1. The surgical incision was healing well without signs of infection.  Physical therapy was initiated postoperatively for transfers, gait training, and strengthening. Occupational therapy was initiated for activities of daily living and evaluation for assisted devices. Rehabilitation goals were reviewed in detail with the patient. The patient made steady progress with  physical therapy  and physical therapy recommended discharge to Home.   The patient achieved his preliminary goals of this hospitalization and was felt to be medically and orthopaedically appropriate for discharge.  He was given perioperative antibiotics:  Anti-infectives (From admission, onward)   Start     Dose/Rate Route Frequency Ordered Stop   05/23/19 1900  ceFAZolin (ANCEF) IVPB 2g/100 mL premix     2 g 200 mL/hr over 30 Minutes Intravenous Every 6 hours 05/23/19 1701 05/24/19 1253   05/23/19 0932  ceFAZolin (ANCEF) 2-4 GM/100ML-% IVPB    Note to Pharmacy: Cleatis Polka   : cabinet override      05/23/19 0932 05/23/19 2039   05/23/19 0930  ceFAZolin (ANCEF) IVPB 2g/100 mL premix     2 g 200 mL/hr over 30 Minutes Intravenous On call to O.R. 05/23/19 [id number] 05/23/19 1130    .  Recent vital signs:  Vitals:   05/24/19 2237 05/25/19 0133  BP: 139/75 120/73  Pulse:  63  Resp:  17  Temp:  98.1 F (36.7 C)  SpO2:  98%    Recent laboratory studies:  No results for input(s): WBC, HGB, HCT, PLT, K, CL, CO2, BUN, CREATININE, GLUCOSE, CALCIUM, LABPT, INR in the last 72 hours.  Diagnostic Studies: DG Knee Left Port  Result Date: 05/23/2019 CLINICAL DATA:  Left knee arthroplasty, postoperative evaluation EXAM: PORTABLE LEFT KNEE - 1-2 VIEW COMPARISON:  None. FINDINGS: Frontal and cross-table lateral views of the left knee demonstrate 3 component left knee arthroplasty in the expected position without signs of acute complication. Postsurgical changes are seen in the soft tissues. IMPRESSION: 1. Unremarkable left knee arthroplasty. Electronically Signed   By: Randa Ngo M.D.   On: 05/23/2019 15:50    Discharge Medications:   Allergies as of 05/25/2019   Not on File     Medication List    STOP taking these medications   aspirin 81 MG EC tablet   BC HEADACHE POWDER PO     TAKE these medications   atorvastatin 80 MG tablet Commonly known as: LIPITOR Take 80 mg by mouth daily.   celecoxib  200 MG capsule Commonly known as: CELEBREX Take 1 capsule (200 mg total) by mouth 2 (two) times daily.   enoxaparin 40 MG/0.4ML injection Commonly known as: LOVENOX Inject 0.4 mLs (40 mg total) into the skin daily for 14 days.   metFORMIN 500 MG tablet Commonly known as: GLUCOPHAGE Take 1 tablet (500 mg total) by mouth 2 (two) times daily with a meal.   metoprolol tartrate 25 MG tablet Commonly known as: LOPRESSOR Take 1 tablet (25 mg total) by mouth 2 (two) times daily.   oxybutynin 10 MG 24 hr tablet Commonly known as: Ditropan XL Take 1 tablet (10 mg total) by mouth daily.   oxyCODONE 5 MG immediate release tablet Commonly known as: Oxy IR/ROXICODONE Take 1 tablet (5 mg total) by mouth every 4 (four) hours as needed for moderate pain (pain score 4-6).   traMADol 50 MG tablet Commonly known as: ULTRAM Take 1 tablet (50 mg total) by mouth every 4 (four) hours as needed for moderate pain.   Vitamin D 125 MCG (5000 UT) Caps Take 5,000 Units by mouth daily.            Durable Medical Equipment  (From admission, onward)         Start     Ordered   05/23/19 1700  DME Walker rolling  Once    Question:  Patient needs a walker to treat with the following condition  Answer:  Total knee replacement status   05/23/19 1701   05/23/19 1700  DME Bedside commode  Once    Question:  Patient needs a bedside commode to treat with the following condition  Answer:  Total knee replacement status   05/23/19 1701          Disposition: home with home health therapy     Follow-up Information    [facility] On 06/07/2019.   Specialty: Orthopedic Surgery Why: at 9:15am Contact information: [place] [address] [phone]        Dereck Leep, MD On 07/05/2019.   Specialty: Orthopedic Surgery Why: at 2:45pm Contact information: [place]  [place] [address] [place], PA-C 05/25/2019, 7:02 AM

## 2019-05-26 ENCOUNTER — Other Ambulatory Visit: Payer: 59

## 2019-05-26 DIAGNOSIS — I119 Hypertensive heart disease without heart failure: Secondary | ICD-10-CM | POA: Diagnosis not present

## 2019-05-26 DIAGNOSIS — I2510 Atherosclerotic heart disease of native coronary artery without angina pectoris: Secondary | ICD-10-CM | POA: Diagnosis not present

## 2019-05-26 DIAGNOSIS — Z471 Aftercare following joint replacement surgery: Secondary | ICD-10-CM | POA: Diagnosis not present

## 2019-05-26 DIAGNOSIS — E785 Hyperlipidemia, unspecified: Secondary | ICD-10-CM | POA: Diagnosis not present

## 2019-05-26 DIAGNOSIS — I252 Old myocardial infarction: Secondary | ICD-10-CM | POA: Diagnosis not present

## 2019-05-26 DIAGNOSIS — G4733 Obstructive sleep apnea (adult) (pediatric): Secondary | ICD-10-CM | POA: Diagnosis not present

## 2019-05-26 DIAGNOSIS — Z6836 Body mass index (BMI) 36.0-36.9, adult: Secondary | ICD-10-CM | POA: Diagnosis not present

## 2019-05-26 DIAGNOSIS — E119 Type 2 diabetes mellitus without complications: Secondary | ICD-10-CM | POA: Diagnosis not present

## 2019-05-26 DIAGNOSIS — R69 Illness, unspecified: Secondary | ICD-10-CM | POA: Diagnosis not present

## 2019-05-26 NOTE — Progress Notes (Signed)
Tumor Board Documentation  Daniel Bradley. was presented by Dr Erlene Quan at our Tumor Board on 05/26/2019, which included representatives from medical oncology, radiation oncology, navigation, pathology, radiology, surgical, surgical oncology, internal medicine, genetics, palliative care, research.  Daniel Bradley currently presents as an external consult, for discussion with history of the following treatments: active survellience.  Additionally, we reviewed previous medical and familial history, history of present illness, and recent lab results along with all available histopathologic and imaging studies. The tumor board considered available treatment options and made the following recommendations: Active surveillance(3 month follow up scan) Refer to Genetics  The following procedures/referrals were also placed: No orders of the defined types were placed in this encounter.   Clinical Trial Status: not discussed   Staging used: Not Applicable  National site-specific guidelines   were discussed with respect to the case.  Tumor board is a meeting of clinicians from various specialty areas who evaluate and discuss patients for whom a multidisciplinary approach is being considered. Final determinations in the plan of care are those of the provider(s). The responsibility for follow up of recommendations given during tumor board is that of the provider.   Today's extended care, comprehensive team conference, Daniel Bradley was not present for the discussion and was not examined.   Multidisciplinary Tumor Board is a multidisciplinary case peer review process.  Decisions discussed in the Multidisciplinary Tumor Board reflect the opinions of the specialists present at the conference without having examined the patient.  Ultimately, treatment and diagnostic decisions rest with the primary provider(s) and the patient.

## 2019-05-30 DIAGNOSIS — R69 Illness, unspecified: Secondary | ICD-10-CM | POA: Diagnosis not present

## 2019-05-30 DIAGNOSIS — I119 Hypertensive heart disease without heart failure: Secondary | ICD-10-CM | POA: Diagnosis not present

## 2019-05-30 DIAGNOSIS — I2510 Atherosclerotic heart disease of native coronary artery without angina pectoris: Secondary | ICD-10-CM | POA: Diagnosis not present

## 2019-05-30 DIAGNOSIS — G4733 Obstructive sleep apnea (adult) (pediatric): Secondary | ICD-10-CM | POA: Diagnosis not present

## 2019-05-30 DIAGNOSIS — I252 Old myocardial infarction: Secondary | ICD-10-CM | POA: Diagnosis not present

## 2019-05-30 DIAGNOSIS — Z6836 Body mass index (BMI) 36.0-36.9, adult: Secondary | ICD-10-CM | POA: Diagnosis not present

## 2019-05-30 DIAGNOSIS — Z471 Aftercare following joint replacement surgery: Secondary | ICD-10-CM | POA: Diagnosis not present

## 2019-05-30 DIAGNOSIS — E785 Hyperlipidemia, unspecified: Secondary | ICD-10-CM | POA: Diagnosis not present

## 2019-05-30 DIAGNOSIS — E119 Type 2 diabetes mellitus without complications: Secondary | ICD-10-CM | POA: Diagnosis not present

## 2019-06-01 ENCOUNTER — Other Ambulatory Visit: Payer: Self-pay

## 2019-06-01 ENCOUNTER — Telehealth (INDEPENDENT_AMBULATORY_CARE_PROVIDER_SITE_OTHER): Payer: 59 | Admitting: Urology

## 2019-06-01 DIAGNOSIS — G4733 Obstructive sleep apnea (adult) (pediatric): Secondary | ICD-10-CM | POA: Diagnosis not present

## 2019-06-01 DIAGNOSIS — Z6836 Body mass index (BMI) 36.0-36.9, adult: Secondary | ICD-10-CM | POA: Diagnosis not present

## 2019-06-01 DIAGNOSIS — E119 Type 2 diabetes mellitus without complications: Secondary | ICD-10-CM | POA: Diagnosis not present

## 2019-06-01 DIAGNOSIS — I252 Old myocardial infarction: Secondary | ICD-10-CM | POA: Diagnosis not present

## 2019-06-01 DIAGNOSIS — I119 Hypertensive heart disease without heart failure: Secondary | ICD-10-CM | POA: Diagnosis not present

## 2019-06-01 DIAGNOSIS — I2510 Atherosclerotic heart disease of native coronary artery without angina pectoris: Secondary | ICD-10-CM | POA: Diagnosis not present

## 2019-06-01 DIAGNOSIS — N3289 Other specified disorders of bladder: Secondary | ICD-10-CM

## 2019-06-01 DIAGNOSIS — Z471 Aftercare following joint replacement surgery: Secondary | ICD-10-CM | POA: Diagnosis not present

## 2019-06-01 DIAGNOSIS — E785 Hyperlipidemia, unspecified: Secondary | ICD-10-CM | POA: Diagnosis not present

## 2019-06-01 DIAGNOSIS — R69 Illness, unspecified: Secondary | ICD-10-CM | POA: Diagnosis not present

## 2019-06-01 NOTE — Progress Notes (Signed)
Virtual Visit via Telephone Note  I connected with Daniel Bradley. on 06/01/19 at  4:15 PM EDT by telephone and verified that I am speaking with the correct person using two identifiers.  Location: Patient: home Provider: office   I discussed the limitations, risks, security and privacy concerns of performing an evaluation and management service by telephone and the availability of in person appointments. I also discussed with the patient that there may be a patient responsible charge related to this service. The patient expressed understanding and agreed to proceed.   History of Present Illness: Daniel Bradley. is a 64 y.o. M with PMHx of gleason 3+4 prostate cancer s/p radical prostatectomy in 2015 presents today after ED f/u for the evaluation and management of kidney stones and bladder mass.  ED visit 04/03/19 c/o of abdominal pain, emesis, and kidney stones.    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 Bilateral nephrolithiasis and left-sided obstructive/inflammatory changes related to passage of a 4 mm stone in bladder were also noted.   He had a previous robotic prostate cancer surgery in [place]. No access to records yet.   Cysto on 05/18/19 completely unremarkable.   Presented case at Tumor board on 05/26/19 where it was recommended to pursue active surveillance w/ 3 month f/u scan.   Observations/Objective: Pt is engaged and asking good questions.   Assessment and Plan:  1. Bladder mass After discussing the patient in multidisciplinary tumor board as well as review with the radiologist, having additional history that the patient underwent prostatectomy with bladder takedown, the radiologist was more convinced that this was in fact a benign finding rather than a malignant tumor after reviewing the imaging again  Strongly recommend follow-up  CT scan in 3 months to reassess.  Patient is agreeable this plan.   Follow Up Instructions: F/u CT 3 months   I discussed the assessment and treatment plan with the patient. The patient was provided an opportunity to ask questions and all were answered. The patient agreed with the plan and demonstrated an understanding of the instructions.   The patient was advised to call back or seek an in-person evaluation if the symptoms worsen or if the condition fails to improve as anticipated.  I provided 12 minutes of non-face-to-face time during this encounter.  Jamas Lav, am acting as a scribe for Dr. Hollice Espy,  I have reviewed the above documentation for accuracy and completeness, and I agree with the above.   Hollice Espy, MD

## 2019-06-03 DIAGNOSIS — I2510 Atherosclerotic heart disease of native coronary artery without angina pectoris: Secondary | ICD-10-CM | POA: Diagnosis not present

## 2019-06-03 DIAGNOSIS — Z6836 Body mass index (BMI) 36.0-36.9, adult: Secondary | ICD-10-CM | POA: Diagnosis not present

## 2019-06-03 DIAGNOSIS — E119 Type 2 diabetes mellitus without complications: Secondary | ICD-10-CM | POA: Diagnosis not present

## 2019-06-03 DIAGNOSIS — E785 Hyperlipidemia, unspecified: Secondary | ICD-10-CM | POA: Diagnosis not present

## 2019-06-03 DIAGNOSIS — I119 Hypertensive heart disease without heart failure: Secondary | ICD-10-CM | POA: Diagnosis not present

## 2019-06-03 DIAGNOSIS — Z471 Aftercare following joint replacement surgery: Secondary | ICD-10-CM | POA: Diagnosis not present

## 2019-06-03 DIAGNOSIS — G4733 Obstructive sleep apnea (adult) (pediatric): Secondary | ICD-10-CM | POA: Diagnosis not present

## 2019-06-03 DIAGNOSIS — I252 Old myocardial infarction: Secondary | ICD-10-CM | POA: Diagnosis not present

## 2019-06-03 DIAGNOSIS — R69 Illness, unspecified: Secondary | ICD-10-CM | POA: Diagnosis not present

## 2019-06-06 DIAGNOSIS — E785 Hyperlipidemia, unspecified: Secondary | ICD-10-CM | POA: Diagnosis not present

## 2019-06-06 DIAGNOSIS — Z6836 Body mass index (BMI) 36.0-36.9, adult: Secondary | ICD-10-CM | POA: Diagnosis not present

## 2019-06-06 DIAGNOSIS — I252 Old myocardial infarction: Secondary | ICD-10-CM | POA: Diagnosis not present

## 2019-06-06 DIAGNOSIS — R69 Illness, unspecified: Secondary | ICD-10-CM | POA: Diagnosis not present

## 2019-06-06 DIAGNOSIS — Z471 Aftercare following joint replacement surgery: Secondary | ICD-10-CM | POA: Diagnosis not present

## 2019-06-06 DIAGNOSIS — I2510 Atherosclerotic heart disease of native coronary artery without angina pectoris: Secondary | ICD-10-CM | POA: Diagnosis not present

## 2019-06-06 DIAGNOSIS — E119 Type 2 diabetes mellitus without complications: Secondary | ICD-10-CM | POA: Diagnosis not present

## 2019-06-06 DIAGNOSIS — G4733 Obstructive sleep apnea (adult) (pediatric): Secondary | ICD-10-CM | POA: Diagnosis not present

## 2019-06-06 DIAGNOSIS — I119 Hypertensive heart disease without heart failure: Secondary | ICD-10-CM | POA: Diagnosis not present

## 2019-06-07 DIAGNOSIS — Z96652 Presence of left artificial knee joint: Secondary | ICD-10-CM | POA: Diagnosis not present

## 2019-06-10 DIAGNOSIS — M25562 Pain in left knee: Secondary | ICD-10-CM | POA: Diagnosis not present

## 2019-06-10 DIAGNOSIS — Z96652 Presence of left artificial knee joint: Secondary | ICD-10-CM | POA: Diagnosis not present

## 2019-06-17 DIAGNOSIS — Z96652 Presence of left artificial knee joint: Secondary | ICD-10-CM | POA: Diagnosis not present

## 2019-06-21 DIAGNOSIS — Z96652 Presence of left artificial knee joint: Secondary | ICD-10-CM | POA: Diagnosis not present

## 2019-06-23 DIAGNOSIS — I214 Non-ST elevation (NSTEMI) myocardial infarction: Secondary | ICD-10-CM | POA: Diagnosis not present

## 2019-06-23 DIAGNOSIS — I2510 Atherosclerotic heart disease of native coronary artery without angina pectoris: Secondary | ICD-10-CM | POA: Diagnosis not present

## 2019-06-23 DIAGNOSIS — I10 Essential (primary) hypertension: Secondary | ICD-10-CM | POA: Diagnosis not present

## 2019-06-23 DIAGNOSIS — E119 Type 2 diabetes mellitus without complications: Secondary | ICD-10-CM | POA: Diagnosis not present

## 2019-06-24 DIAGNOSIS — Z96652 Presence of left artificial knee joint: Secondary | ICD-10-CM | POA: Diagnosis not present

## 2019-06-28 ENCOUNTER — Other Ambulatory Visit: Payer: Self-pay

## 2019-06-28 ENCOUNTER — Ambulatory Visit: Payer: Self-pay

## 2019-06-28 DIAGNOSIS — I25810 Atherosclerosis of coronary artery bypass graft(s) without angina pectoris: Secondary | ICD-10-CM

## 2019-06-28 NOTE — Progress Notes (Signed)
Need to send lab results to Dr. Ubaldo Glassing at [facility]  AMD

## 2019-06-29 LAB — CMP12+LP+TP+TSH+6AC+CBC/D/PLT
ALT: 18 IU/L (ref 0–44)
AST: 19 IU/L (ref 0–40)
Albumin/Globulin Ratio: 1.5 (ref 1.2–2.2)
Albumin: 4 g/dL (ref 3.8–4.8)
Alkaline Phosphatase: 130 IU/L — ABNORMAL HIGH (ref 48–121)
BUN/Creatinine Ratio: 16 (ref 10–24)
BUN: 14 mg/dL (ref 8–27)
Basophils Absolute: 0.1 10*3/uL (ref 0.0–0.2)
Basos: 2 %
Bilirubin Total: 0.7 mg/dL (ref 0.0–1.2)
Calcium: 9.4 mg/dL (ref 8.6–10.2)
Chloride: 106 mmol/L (ref 96–106)
Chol/HDL Ratio: 2.8 ratio (ref 0.0–5.0)
Cholesterol, Total: 149 mg/dL (ref 100–199)
Creatinine, Ser: 0.86 mg/dL (ref 0.76–1.27)
EOS (ABSOLUTE): 0.3 10*3/uL (ref 0.0–0.4)
Eos: 6 %
Estimated CHD Risk: <0.5 times avg. (ref 0.0–1.0)
Free Thyroxine Index: 1.4 (ref 1.2–4.9)
GFR calc Af Amer: 107 mL/min/{1.73_m2} (ref >59)
GFR calc non Af Amer: 92 mL/min/{1.73_m2} (ref >59)
GGT: 33 IU/L (ref 0–65)
Globulin, Total: 2.6 g/dL (ref 1.5–4.5)
Glucose: 140 mg/dL — ABNORMAL HIGH (ref 65–99)
HDL: 54 mg/dL (ref >39)
Hematocrit: 35.6 % — ABNORMAL LOW (ref 37.5–51.0)
Hemoglobin: 11.8 g/dL — ABNORMAL LOW (ref 13.0–17.7)
Immature Grans (Abs): 0 10*3/uL (ref 0.0–0.1)
Immature Granulocytes: 0 %
Iron: 61 ug/dL (ref 38–169)
LDH: 203 IU/L (ref 121–224)
LDL Chol Calc (NIH): 83 mg/dL (ref 0–99)
Lymphocytes Absolute: 1.4 10*3/uL (ref 0.7–3.1)
Lymphs: 29 %
MCH: 25.9 pg — ABNORMAL LOW (ref 26.6–33.0)
MCHC: 33.1 g/dL (ref 31.5–35.7)
MCV: 78 fL — ABNORMAL LOW (ref 79–97)
Monocytes Absolute: 0.5 10*3/uL (ref 0.1–0.9)
Monocytes: 10 %
Neutrophils Absolute: 2.6 10*3/uL (ref 1.4–7.0)
Neutrophils: 53 %
Phosphorus: 3.4 mg/dL (ref 2.8–4.1)
Platelets: 322 10*3/uL (ref 150–450)
Potassium: 4.6 mmol/L (ref 3.5–5.2)
RBC: 4.56 x10E6/uL (ref 4.14–5.80)
RDW: 14.4 % (ref 11.6–15.4)
Sodium: 141 mmol/L (ref 134–144)
T3 Uptake Ratio: 21 % — ABNORMAL LOW (ref 24–39)
T4, Total: 6.5 ug/dL (ref 4.5–12.0)
TSH: 1.31 u[IU]/mL (ref 0.450–4.500)
Total Protein: 6.6 g/dL (ref 6.0–8.5)
Triglycerides: 55 mg/dL (ref 0–149)
Uric Acid: 4.4 mg/dL (ref 3.8–8.4)
VLDL Cholesterol Cal: 12 mg/dL (ref 5–40)
WBC: 4.9 10*3/uL (ref 3.4–10.8)

## 2019-06-30 DIAGNOSIS — Z96652 Presence of left artificial knee joint: Secondary | ICD-10-CM | POA: Diagnosis not present

## 2019-07-01 ENCOUNTER — Telehealth: Payer: Self-pay

## 2019-07-01 NOTE — Telephone Encounter (Signed)
Noted in Epic patient had appts with urology 05/17/2019 office 05/18/2019 cystoscopy normal but started on oxybutin and told to improve glucose control and 06/01/2019 tumor board recommended follow up CT scan in 3 months.

## 2019-07-01 NOTE — Telephone Encounter (Signed)
Lab results collected 06/28/19 faxed to [facility], PA-C/Dr. Ubaldo Glassing at [facility]. [phone]  AMD

## 2019-07-01 NOTE — Telephone Encounter (Signed)
[facility] recommended tighter follow up/cancer surveillance next CT scan in 3 months from last in ER.

## 2019-07-05 DIAGNOSIS — Z96652 Presence of left artificial knee joint: Secondary | ICD-10-CM | POA: Diagnosis not present

## 2019-07-07 DIAGNOSIS — Z96652 Presence of left artificial knee joint: Secondary | ICD-10-CM | POA: Diagnosis not present

## 2019-07-08 NOTE — Progress Notes (Signed)
Physical completed 03/07/19 with Dr. Dannial Monarch.  Review lab results collected on 06/28/19.  AMD  07/12/19: pt states he's been feeling dizzy for about 3 months and hasn't been getting no sleep for 3 months as well . CL,RMA

## 2019-07-12 ENCOUNTER — Ambulatory Visit: Payer: 59 | Admitting: Emergency Medicine

## 2019-07-12 ENCOUNTER — Other Ambulatory Visit: Payer: Self-pay

## 2019-07-12 ENCOUNTER — Encounter: Payer: Self-pay | Admitting: Emergency Medicine

## 2019-07-12 VITALS — BP 148/84 | HR 81 | Temp 96.0°F | Resp 16 | Ht 73.0 in | Wt 272.0 lb

## 2019-07-12 DIAGNOSIS — R42 Dizziness and giddiness: Secondary | ICD-10-CM

## 2019-07-12 DIAGNOSIS — G479 Sleep disorder, unspecified: Secondary | ICD-10-CM

## 2019-07-12 DIAGNOSIS — H9313 Tinnitus, bilateral: Secondary | ICD-10-CM

## 2019-07-12 MED ORDER — MECLIZINE HCL 25 MG PO TABS: 25.0000 mg | ORAL_TABLET | Freq: Three times a day (TID) | ORAL | 0 refills | Status: DC | PRN

## 2019-07-12 NOTE — Progress Notes (Signed)
I have reviewed the triage vital signs and the nursing notes.   HISTORY  Chief Complaint Follow-up HPI Daniel Bradley. is a 64 y.o. male is same day for history of dizziness for the last 3 months.  Patient states it is not constant.  He reports that when he gets up quickly it can last for few seconds.  He states he is also experienced it when lying in bed.  He denies any nausea, vomiting or visual changes.  He states he is experienced infrequent frontal headaches mostly related to his sinus problems and  completely resolved with BC powders.  He is also experiencing some high-pitched noise in his ears that he is not aware has been there before.  He also complains of lack of sleep for the past 3 months which is unrelated to his dizziness.     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   Elevated prostate specific antigen (PSA) 02/21/2013   Hemorrhoid    History of kidney stones    Hyperlipidemia    Hypertension    Incomplete bladder emptying 01/10/2013   Malignant neoplasm of prostate ([place]) 04/28/2013   prostate removed   Myocardial infarction [facility]) 2019   2 stents placed   Obstructive sleep apnea on CPAP    does not use cpap, does not snore much since loosing 80 lbs    Patient Active Problem List   Diagnosis Date Noted   Total knee replacement status 05/23/2019   Primary osteoarthritis of left knee 03/20/2019   Diabetes mellitus ([place]) 09/09/2018   Essential hypertension 09/09/2018   Coronary artery disease involving coronary bypass graft of native heart without angina pectoris 09/09/2018   Other hyperlipidemia 09/09/2018   Impacted cerumen of left ear 09/09/2018   Class 2 severe obesity due to excess calories with serious comorbidity and  body mass index (BMI) of 39.0 to 39.9 in adult [facility]) 09/09/2018   Non-STEMI (non-ST elevated myocardial infarction) ([place]) 08/19/2017   Bladder outflow obstruction 04/28/2013   Malignant neoplasm of prostate ([place]) 04/28/2013   Elevated prostate specific antigen (PSA) 02/21/2013   Decreased libido 01/10/2013   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/2013   Incomplete bladder emptying 01/10/2013    Past Surgical History:  Procedure Laterality Date   COLONOSCOPY     CORONARY STENT INTERVENTION N/A 08/20/2017   Procedure: CORONARY STENT INTERVENTION;  Surgeon: Yolonda Kida, MD;  Location: [place] [facility];  Service: Cardiovascular;  Laterality: N/A;   dental implants     DG KNEE RIGHT COMPLETE ([place] HX) Right 05/23/2019   KNEE ARTHROPLASTY Left 05/23/2019   Procedure: COMPUTER ASSISTED TOTAL KNEE ARTHROPLASTY;  Surgeon: Dereck Leep, MD;  Location: [facility] ORS;  Service: Orthopedics;  Laterality: Left;   LEFT HEART CATH AND CORONARY ANGIOGRAPHY N/A 08/20/2017   Procedure: LEFT HEART CATH AND CORONARY ANGIOGRAPHY;  Surgeon: Teodoro Spray, MD;  Location: [place] [facility];  Service: Cardiovascular;  Laterality: N/A;   PROSTATE SURGERY  07/14/2013   Prostatectomy    Prior to Admission medications   Medication Sig Start Date End Date Taking? Authorizing Provider  Cholecalciferol (VITAMIN D) 125 MCG (5000 UT) CAPS Take 5,000 Units by mouth daily.   Yes [provider]  metFORMIN (GLUCOPHAGE) 500  MG tablet Take 1 tablet (500 mg total) by mouth 2 (two) times daily with a meal. 10/04/18  Yes Betancourt, Tina A, NP  metoprolol tartrate (LOPRESSOR) 25 MG tablet Take 1 tablet (25 mg total) by mouth 2 (two) times daily. 08/21/17  Yes Gladstone Lighter, MD  atorvastatin (LIPITOR) 80 MG tablet Take 80 mg by mouth daily.    [provider]  celecoxib (CELEBREX) 200 MG capsule Take 1 capsule (200 mg total) by mouth 2 (two) times daily. Patient not  taking: Reported on 07/12/2019 05/25/19   Fausto Skillern, PA-C  meclizine (ANTIVERT) 25 MG tablet Take 1 tablet (25 mg total) by mouth 3 (three) times daily as needed for dizziness. 07/12/19   Johnn Hai, PA-C  oxybutynin (DITROPAN XL) 10 MG 24 hr tablet Take 1 tablet (10 mg total) by mouth daily. Patient not taking: Reported on 07/12/2019 05/18/19 05/17/20  Hollice Espy, MD  oxyCODONE (OXY IR/ROXICODONE) 5 MG immediate release tablet Take 1 tablet (5 mg total) by mouth every 4 (four) hours as needed for moderate pain (pain score 4-6). Patient not taking: Reported on 07/12/2019 05/25/19   Tamala Julian B, PA-C  traMADol (ULTRAM) 50 MG tablet Take 1 tablet (50 mg total) by mouth every 4 (four) hours as needed for moderate pain. Patient not taking: Reported on 07/12/2019 05/25/19   Fausto Skillern, PA-C    Allergies Patient has no allergy information on record.  Family History  Problem Relation Age of Onset   Pancreatic cancer Brother    Alcoholism Mother    Alcoholism Father    Prostate cancer Neg Hx    Kidney disease Neg Hx     Social History Social History   Tobacco Use   Smoking status: Never Smoker   Smokeless tobacco: Never Used  Substance Use Topics   Alcohol use: No    Alcohol/week: 0.0 standard drinks    Comment: rare   Drug use: No    Review of Systems Constitutional: No fever/chills Eyes: Mild blurred visual changes. ENT: No sore throat.  Experiencing high-pitched noise bilateral ears. Cardiovascular: Denies chest pain. Respiratory: Denies shortness of breath. Gastrointestinal: No abdominal pain.  No nausea, no vomiting. Genitourinary: Negative for dysuria. Musculoskeletal: Negative for back pain. Skin: Negative for rash. Neurological: Positive for infrequent frontal headaches, no focal weakness or numbness. ___________________________________________   PHYSICAL EXAM:  Constitutional: Alert and oriented. Well appearing and in no acute  distress. Eyes: Conjunctivae are normal. PERRL.  Senile arcus bilaterally.  EOMI  without nystagmus.. Head: Atraumatic. Nose: No congestion/rhinnorhea. Mouth/Throat: Mucous membranes are moist.  Oropharynx non-erythematous. Neck: No stridor.  Hematological/Lymphatic/Immunilogical: No cervical lymphadenopathy. Cardiovascular: Normal rate, regular rhythm. Grossly normal heart sounds.  Good peripheral circulation. Respiratory: Normal respiratory effort.  No retractions. Lungs CTAB. Gastrointestinal: Soft and nontender. No distention.  Musculoskeletal: No lower extremity tenderness nor edema.  Good muscle strength bilaterally.  Patient is able to ambulate without any assistance and had no difficulty getting from a sitting to standing position in the exam room. Neurologic:  Normal speech and language. No gross focal neurologic deficits are appreciated. No gait instability. Skin:  Skin is warm, dry and intact. No rash noted. Psychiatric: Mood and affect are normal. Speech and behavior are normal.  ___________________________________________   FINAL CLINICAL IMPRESSION(S)  Dizziness Tinnitus bilateral ears Sleep disturbance   Patient is willing to try Antivert as he has not taken anything to see if it helps with his dizziness.  If this medication is not working  he is to call the clinic to let them know so that they may have him scheduled for a CT scan and or refer to [facility].  We also discussed once his dizziness has improved that a mild sleeping aid would be more appropriate.  He understands that with the dizziness that a sleeping pill may increase his chances for falling and getting injured.   ED Discharge Orders         Ordered    meclizine (ANTIVERT) 25 MG tablet  3 times daily PRN     07/12/19 1518           Note:  This document was prepared using Dragon voice recognition software and may include unintentional dictation errors.

## 2019-07-24 DIAGNOSIS — Z471 Aftercare following joint replacement surgery: Secondary | ICD-10-CM | POA: Diagnosis not present

## 2019-08-31 ENCOUNTER — Ambulatory Visit
Admission: RE | Admit: 2019-08-31 | Discharge: 2019-08-31 | Disposition: A | Discharge status: Home / Self Care | Payer: 59 | Source: Ambulatory Visit | Attending: Urology | Admitting: Urology

## 2019-08-31 ENCOUNTER — Other Ambulatory Visit: Payer: Self-pay

## 2019-08-31 DIAGNOSIS — Z9079 Acquired absence of other genital organ(s): Secondary | ICD-10-CM | POA: Diagnosis not present

## 2019-08-31 DIAGNOSIS — N3289 Other specified disorders of bladder: Secondary | ICD-10-CM | POA: Diagnosis not present

## 2019-08-31 DIAGNOSIS — M47816 Spondylosis without myelopathy or radiculopathy, lumbar region: Secondary | ICD-10-CM | POA: Diagnosis not present

## 2019-08-31 DIAGNOSIS — K5730 Diverticulosis of large intestine without perforation or abscess without bleeding: Secondary | ICD-10-CM | POA: Diagnosis not present

## 2019-08-31 LAB — POCT I-STAT CREATININE: Creatinine, Ser: 0.9 mg/dL (ref 0.61–1.24)

## 2019-08-31 MED ORDER — IOHEXOL 300 MG/ML  SOLN
100.0000 mL | Freq: Once | INTRAMUSCULAR | Status: AC | PRN
  Administered 2019-08-31: 100 mL via INTRAVENOUS

## 2019-09-05 NOTE — Progress Notes (Signed)
09/06/2019 11:25 AM   Daniel Bradley. 08/09/1955 [id number]  Referring provider: No referring provider defined for this encounter. Chief Complaint  Patient presents with  . Nephrolithiasis    HPI: Daniel Bradley. is a 64 y.o. M with PMHx of gleason 3+4 prostate cancer s/p radical prostatectomy in 2015 presents today for a 3 month follow up and review of pelvis CT.   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 Bilateral nephrolithiasis and left-sided obstructive/inflammatory changes related to passage of a 4 mm stone in bladder were also noted.  He had a previous robotic prostate cancer surgery in [place]. No access to records yet.   Cysto on 05/18/19 completely unremarkable.   Presented case at Tumor board on 05/26/19 where it was recommended to pursue active surveillance w/ 3 month f/u scan.  Felt to be post surgical rather than neoplastic.  Pelvis CT on 08/31/2019 showed persistent soft tissue density 1.6 cm lesion in the midline anterior bladder wall with coarse peripheral calcification, unchanged since 04/03/2019 CT. Differential includes inflammatory or neoplastic etiologies, although the stability and presence of coarse calcification may indicate a benign inflammatory lesion. No pelvic lymphadenopathy. Mild sigmoid diverticulosis.  He has dizziness and lightheadedness at night time.   He reports erectile dysfunction. He has tried Viagra and Cialis in the past  PMH: Past Medical History:  Diagnosis Date  . Arthritis of knee   . 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 Coronary artery disease   . Decreased libido 01/10/2013  . Depression   . DM (diabetes mellitus) ([place])   . 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 Elevated prostate specific antigen  (PSA) 02/21/2013  . Hemorrhoid   . History of kidney stones   . Hyperlipidemia   . Hypertension   . Incomplete bladder emptying 01/10/2013  . Malignant neoplasm of prostate ([place]) 04/28/2013   prostate removed  . Myocardial infarction ([place]) 2019   2 stents placed  . Obstructive sleep apnea on CPAP    does not use cpap, does not snore much since loosing 80 lbs    Surgical History: Past Surgical History:  Procedure Laterality Date  . COLONOSCOPY    . CORONARY STENT INTERVENTION N/A 08/20/2017   Procedure: CORONARY STENT INTERVENTION;  Surgeon: Yolonda Kida, MD;  Location: [place] [facility];  Service: Cardiovascular;  Laterality: N/A;  . dental implants    . DG KNEE RIGHT COMPLETE ([place] HX) Right 05/23/2019  . KNEE ARTHROPLASTY Left 05/23/2019   Procedure: COMPUTER ASSISTED TOTAL KNEE ARTHROPLASTY;  Surgeon: Dereck Leep, MD;  Location: [facility] ORS;  Service: Orthopedics;  Laterality: Left;  . LEFT HEART CATH AND CORONARY ANGIOGRAPHY N/A 08/20/2017   Procedure: LEFT HEART CATH AND CORONARY ANGIOGRAPHY;  Surgeon: Teodoro Spray, MD;  Location: [place] [facility];  Service: Cardiovascular;  Laterality: N/A;  . PROSTATE SURGERY  07/14/2013   Prostatectomy    Home Medications:  Allergies as of 09/06/2019   No Known Allergies     Medication List       Accurate as of September 06, 2019 11:25 AM. If you have any questions, ask your nurse or doctor.        STOP taking these medications   celecoxib 200 MG capsule Commonly known as: CELEBREX Stopped by: Caryl Pina  Erlene Quan, MD   meclizine 25 MG tablet Commonly known as: ANTIVERT Stopped by: Hollice Espy, MD   oxyCODONE 5 MG immediate release tablet Commonly known as: Oxy IR/ROXICODONE Stopped by: Hollice Espy, MD   traMADol 50 MG tablet Commonly known as: ULTRAM Stopped by: Hollice Espy, MD   Vitamin D 125 MCG (5000 UT) Caps Stopped by: Hollice Espy, MD     TAKE these medications   atorvastatin 80 MG  tablet Commonly known as: LIPITOR Take 80 mg by mouth daily.   metFORMIN 500 MG tablet Commonly known as: GLUCOPHAGE Take 1 tablet (500 mg total) by mouth 2 (two) times daily with a meal.   metoprolol tartrate 25 MG tablet Commonly known as: LOPRESSOR Take 1 tablet (25 mg total) by mouth 2 (two) times daily.   oxybutynin 10 MG 24 hr tablet Commonly known as: Ditropan XL Take 1 tablet (10 mg total) by mouth daily.       Allergies: No Known Allergies  Family History: Family History  Problem Relation Age of Onset  . Pancreatic cancer Brother   . Alcoholism Mother   . Alcoholism Father   . Prostate cancer Neg Hx   . Kidney disease Neg Hx     Social History:  reports that he has never smoked. He has never used smokeless tobacco. He reports that he does not drink alcohol and does not use drugs.   Physical Exam: BP (!) 157/85   Pulse 87   Ht 6\' 1"  (1.854 m)   Wt (!) 275 lb (124.7 kg)   BMI 36.28 kg/m   Constitutional:  Alert and oriented, No acute distress. HEENT: [place] AT, moist mucus membranes.  Trachea midline, no masses. Cardiovascular: No clubbing, cyanosis, or edema. Respiratory: Normal respiratory effort, no increased work of breathing. Skin: No rashes, bruises or suspicious lesions. Neurologic: Grossly intact, no focal deficits, moving all 4 extremities. Psychiatric: Normal mood and affect.  Laboratory Data:  Lab Results  Component Value Date   CREATININE 0.90 08/31/2019    Lab Results  Component Value Date   HGBA1C 7.5 (H) 05/17/2019    Pertinent Imaging: CLINICAL DATA:  Follow-up anterior bladder mass.  EXAM: CT PELVIS WITH CONTRAST  TECHNIQUE: Multidetector CT imaging of the pelvis was performed using the standard protocol following the bolus administration of intravenous contrast.  CONTRAST:  170mL OMNIPAQUE IOHEXOL 300 MG/ML  SOLN  COMPARISON:  04/03/2019 CT abdomen/pelvis.  FINDINGS: Urinary Tract: No bladder stones. Redemonstrated  is a soft tissue density 1.6 x 1.1 x 1.7 cm lesion in the midline anterior bladder wall with coarse peripheral calcification (series 2/image 32), previously 1.6 x 1.2 x 1.7 cm on 04/03/2019 CT using similar measurement technique, unchanged, which persists and is unchanged in appearance and location on the prone sequence. Otherwise normal bladder.  Bowel: Normal caliber pelvic small and large bowel loops with no bowel wall thickening. Normal appendix. Oral contrast transits to the distal colon. Mild sigmoid diverticulosis.  Vascular/Lymphatic: No pathologically enlarged pelvic lymph nodes. No acute vascular abnormality.  Reproductive: Prostatectomy. No mass or fluid collection in the prostatectomy bed.  Other: No pelvic ascites or fluid collections. No pneumoperitoneum.  Musculoskeletal: Degenerative changes in the visualized lower lumbar spine. No aggressive appearing focal osseous lesions.  IMPRESSION: 1. Persistent soft tissue density 1.6 cm lesion in the midline anterior bladder wall with coarse peripheral calcification, unchanged since 04/03/2019 CT. Differential includes inflammatory or neoplastic etiologies, although the stability and presence of coarse calcification may indicate a benign inflammatory lesion.  Suggest correlation with cystoscopy as clinically warranted. 2. No pelvic lymphadenopathy. 3. Mild sigmoid diverticulosis.   Electronically Signed   By: Ilona Sorrel M.D.   On: 08/31/2019 13:58   I have personally reviewed the images and agree with radiologist interpretation.    Assessment & Plan:    1. Bladder mass Tumor board felt like mass could be related to a post surgical finding.   Pelvis CT on 08/31/2019 is stable, strongly favors benign process as previous suspected Follow up with a pelvis CT in 1 year, if it remains stable will not need any further imaging.  Patient agreed. PSA has remained undetectable, will recheck in 1 year.  2.  ED Tried Viagra and Cialis in the past and would like to restart Viagra. Educated patient to not take nitroglycerin with Viagra. (he has never used this medication) He was advised to seek immediate medical attention if he experiences chest pain and report Viagra use Patient agreed.  3. Possible vertigo Dizzy and lightheaded at night with position change Advised to f/u with PCP   Return in 1 year with PSA and Pelvis CT.    [facility] [address], [place], [place] [address] [phone]  I, Selena Batten, am acting as a scribe for Dr. Hollice Espy.  I have reviewed the above documentation for accuracy and completeness, and I agree with the above.   Hollice Espy, MD

## 2019-09-06 ENCOUNTER — Other Ambulatory Visit: Payer: Self-pay

## 2019-09-06 ENCOUNTER — Ambulatory Visit (INDEPENDENT_AMBULATORY_CARE_PROVIDER_SITE_OTHER): Payer: 59 | Admitting: Urology

## 2019-09-06 VITALS — BP 157/85 | HR 87 | Ht 73.0 in | Wt 275.0 lb

## 2019-09-06 DIAGNOSIS — N5231 Erectile dysfunction following radical prostatectomy: Secondary | ICD-10-CM | POA: Diagnosis not present

## 2019-09-06 DIAGNOSIS — Z8546 Personal history of malignant neoplasm of prostate: Secondary | ICD-10-CM | POA: Diagnosis not present

## 2019-09-06 DIAGNOSIS — N3289 Other specified disorders of bladder: Secondary | ICD-10-CM

## 2019-09-06 MED ORDER — SILDENAFIL CITRATE 20 MG PO TABS: ORAL_TABLET | ORAL | 6 refills | Status: DC

## 2019-09-07 ENCOUNTER — Ambulatory Visit: Payer: 59 | Admitting: Emergency Medicine

## 2019-09-07 ENCOUNTER — Encounter: Payer: Self-pay | Admitting: Emergency Medicine

## 2019-09-07 VITALS — BP 140/92 | HR 77 | Temp 98.3°F | Resp 16 | Ht 73.0 in | Wt 275.0 lb

## 2019-09-07 DIAGNOSIS — R42 Dizziness and giddiness: Secondary | ICD-10-CM

## 2019-09-07 MED ORDER — MECLIZINE HCL 25 MG PO TABS: 25.0000 mg | ORAL_TABLET | Freq: Three times a day (TID) | ORAL | 3 refills | Status: DC | PRN

## 2019-09-07 NOTE — Progress Notes (Signed)
Pt has been told by his urologist he possibly has vertigo. Pt has been having dizzy spells for about 6 months. Dizziness occurs when laying down, turn from side to side and sometimes when standing up. When pt sits down he needs to stay for a while. Pt has taken some meds for the dizziness but doesn't have no more. Meclizine 25 mg 1 tab po bid as needed for dizziness. CL,RMA

## 2019-09-07 NOTE — Progress Notes (Signed)
I have reviewed the triage vital signs and the nursing notes.   HISTORY  Chief Complaint Dizziness   HPI Daniel Bradley. is a 64 y.o. male is here for continued ringing in his ears.  He states that it happens finally positional but if he rolls over in bed at night staying supine the entire time he gets dizziness.  He states that the meclizine that has been taking in the past helps a great deal.  He takes this on a as needed basis.      Past Medical History:  Diagnosis Date  . Arthritis of knee   . 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 Coronary artery disease   . Decreased libido 01/10/2013  . Depression   . DM (diabetes mellitus) ([place])   . 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 Elevated prostate specific antigen (PSA) 02/21/2013  . Hemorrhoid   . History of kidney stones   . Hyperlipidemia   . Hypertension   . Incomplete bladder emptying 01/10/2013  . Malignant neoplasm of prostate ([place]) 04/28/2013   prostate removed  . Myocardial infarction ([place]) 2019   2 stents placed  . Obstructive sleep apnea on CPAP    does not use cpap, does not snore much since loosing 80 lbs    Patient Active Problem List   Diagnosis Date Noted  . Total knee replacement status 05/23/2019  . Primary osteoarthritis of left knee 03/20/2019  . Diabetes mellitus ([place]) 09/09/2018  . Essential hypertension 09/09/2018  . Coronary artery disease involving coronary bypass graft of native heart without angina pectoris 09/09/2018  . Other hyperlipidemia 09/09/2018  . Impacted cerumen of left ear 09/09/2018  . Class 2 severe obesity due to excess calories with serious comorbidity and body mass index (BMI) of 39.0 to 39.9 in adult ([place]) 09/09/2018  . Non-STEMI (non-ST elevated myocardial infarction) ([place]) 08/19/2017  . 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 Malignant neoplasm of prostate ([place]) 04/28/2013  . Elevated prostate specific antigen (PSA) 02/21/2013  . Decreased  libido 01/10/2013  . 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 Incomplete bladder emptying 01/10/2013    Past Surgical History:  Procedure Laterality Date  . COLONOSCOPY    . CORONARY STENT INTERVENTION N/A 08/20/2017   Procedure: CORONARY STENT INTERVENTION;  Surgeon: Yolonda Kida, MD;  Location: [place] [facility];  Service: Cardiovascular;  Laterality: N/A;  . dental implants    . DG KNEE RIGHT COMPLETE ([place] HX) Right 05/23/2019  . KNEE ARTHROPLASTY Left 05/23/2019   Procedure: COMPUTER ASSISTED TOTAL KNEE ARTHROPLASTY;  Surgeon: Dereck Leep, MD;  Location: [facility] ORS;  Service: Orthopedics;  Laterality: Left;  . LEFT HEART CATH AND CORONARY ANGIOGRAPHY N/A 08/20/2017   Procedure: LEFT HEART CATH AND CORONARY ANGIOGRAPHY;  Surgeon: Teodoro Spray, MD;  Location: [place] [facility];  Service: Cardiovascular;  Laterality: N/A;  . PROSTATE SURGERY  07/14/2013   Prostatectomy    Prior to Admission medications   Medication Sig Start Date End Date Taking? Authorizing Provider  atorvastatin (LIPITOR) 80 MG tablet Take 80 mg by mouth daily.   Yes [provider]  metFORMIN (GLUCOPHAGE) 500 MG tablet Take 1 tablet (500 mg total) by mouth 2 (two) times daily with a meal. 10/04/18  Yes Betancourt, Aura Fey, NP  metoprolol tartrate (LOPRESSOR) 25 MG tablet Take 1 tablet (25 mg total) by mouth 2 (two) times daily. 08/21/17  Yes Gladstone Lighter, MD  sildenafil (REVATIO) 20 MG tablet  20 mg 3-5 times daily as needed 09/06/19  Yes Hollice Espy, MD  meclizine (ANTIVERT) 25 MG tablet Take 1 tablet (25 mg total) by mouth 3 (three) times daily as needed for dizziness. 09/07/19   Johnn Hai, PA-C  oxybutynin (DITROPAN XL) 10 MG 24 hr tablet Take 1 tablet (10 mg total) by mouth daily. 05/18/19 05/17/20  Hollice Espy, MD    Allergies Patient has no known allergies.  Family History  Problem Relation Age of Onset  . Pancreatic cancer Brother   . Alcoholism  Mother   . Alcoholism Father   . Prostate cancer Neg Hx   . Kidney disease Neg Hx     Social History Social History   Tobacco Use  . Smoking status: Never Smoker  . Smokeless tobacco: Never Used  Vaping Use  . Vaping Use: Never used  Substance Use Topics  . Alcohol use: No    Alcohol/week: 0.0 standard drinks    Comment: rare  . Drug use: No    Review of Systems Constitutional: No fever/chills Eyes: No visual changes. ENT: No sore throat.  Negative for ear pain. Cardiovascular: Denies chest pain. Respiratory: Denies shortness of breath. Musculoskeletal: Negative for back pain. Skin: Negative for rash. Neurological: Negative for headaches ____________________________________________   PHYSICAL EXAM: Constitutional: Alert and oriented. Well appearing and in no acute distress. Eyes: Conjunctivae are normal. PERRL. EOMI. no nystagmus seen Head: Atraumatic. Nose: No congestion/rhinnorhea.  EACs are clear bilaterally.  TMs are dull but no erythema or injection is noted. Neck: No stridor.  Cardiovascular: Normal rate, regular rhythm. Grossly normal heart sounds.  Good peripheral circulation. Respiratory: Normal respiratory effort.  No retractions. Lungs CTAB. Musculoskeletal: Moves upper and lower extremities without any difficulty.  Normal gait was noted. Neurologic:  Normal speech and language. No gross focal neurologic deficits are appreciated. No gait instability. Skin:  Skin is warm, dry and intact. No rash noted. Psychiatric: Mood and affect are normal. Speech and behavior are normal.  __________________________________________    FINAL CLINICAL IMPRESSION(S)  Vertigo. Antivert 25mg  1 tid prn #30    ED Discharge Orders         Ordered    meclizine (ANTIVERT) 25 MG tablet  3 times daily PRN     Discontinue  Reprint     09/07/19 1032           Note:  This document was prepared using Dragon voice recognition software and may include unintentional dictation  errors.

## 2019-12-28 DIAGNOSIS — I2510 Atherosclerotic heart disease of native coronary artery without angina pectoris: Secondary | ICD-10-CM | POA: Diagnosis not present

## 2019-12-28 DIAGNOSIS — I10 Essential (primary) hypertension: Secondary | ICD-10-CM | POA: Diagnosis not present

## 2019-12-28 DIAGNOSIS — I214 Non-ST elevation (NSTEMI) myocardial infarction: Secondary | ICD-10-CM | POA: Diagnosis not present

## 2019-12-28 DIAGNOSIS — E119 Type 2 diabetes mellitus without complications: Secondary | ICD-10-CM | POA: Diagnosis not present

## 2019-12-30 ENCOUNTER — Other Ambulatory Visit: Payer: Self-pay

## 2019-12-30 DIAGNOSIS — Z0000 Encounter for general adult medical examination without abnormal findings: Secondary | ICD-10-CM

## 2019-12-30 DIAGNOSIS — Z23 Encounter for immunization: Secondary | ICD-10-CM

## 2019-12-30 LAB — POCT URINALYSIS DIPSTICK
Bilirubin, UA: NEGATIVE
Blood, UA: NEGATIVE
Glucose, UA: NEGATIVE
Ketones, UA: NEGATIVE
Leukocytes, UA: NEGATIVE
Nitrite, UA: NEGATIVE
Protein, UA: NEGATIVE
Spec Grav, UA: >=1.03 — AB (ref 1.010–1.025)
Urobilinogen, UA: 0.2 E.U./dL
pH, UA: 5 (ref 5.0–8.0)

## 2019-12-30 NOTE — Progress Notes (Signed)
Pt follow up with Ron Smith,PA-C. 01/10/20

## 2019-12-31 LAB — CMP12+LP+TP+TSH+6AC+PSA+CBC…
ALT: 21 IU/L (ref 0–44)
AST: 22 IU/L (ref 0–40)
Albumin/Globulin Ratio: 1.8 (ref 1.2–2.2)
Albumin: 4.6 g/dL (ref 3.8–4.8)
Alkaline Phosphatase: 114 IU/L (ref 44–121)
BUN/Creatinine Ratio: 15 (ref 10–24)
BUN: 16 mg/dL (ref 8–27)
Basophils Absolute: 0.1 10*3/uL (ref 0.0–0.2)
Basos: 1 %
Bilirubin Total: 0.6 mg/dL (ref 0.0–1.2)
Calcium: 9.6 mg/dL (ref 8.6–10.2)
Chloride: 103 mmol/L (ref 96–106)
Chol/HDL Ratio: 4.3 ratio (ref 0.0–5.0)
Cholesterol, Total: 219 mg/dL — ABNORMAL HIGH (ref 100–199)
Creatinine, Ser: 1.04 mg/dL (ref 0.76–1.27)
EOS (ABSOLUTE): 0.3 10*3/uL (ref 0.0–0.4)
Eos: 5 %
Estimated CHD Risk: 0.8 times avg. (ref 0.0–1.0)
Free Thyroxine Index: 1.3 (ref 1.2–4.9)
GFR calc Af Amer: 87 mL/min/{1.73_m2} (ref >59)
GFR calc non Af Amer: 76 mL/min/{1.73_m2} (ref >59)
GGT: 55 IU/L (ref 0–65)
Globulin, Total: 2.5 g/dL (ref 1.5–4.5)
Glucose: 195 mg/dL — ABNORMAL HIGH (ref 65–99)
HDL: 51 mg/dL (ref >39)
Hematocrit: 44.3 % (ref 37.5–51.0)
Hemoglobin: 14.2 g/dL (ref 13.0–17.7)
Immature Grans (Abs): 0 10*3/uL (ref 0.0–0.1)
Immature Granulocytes: 0 %
Iron: 71 ug/dL (ref 38–169)
LDH: 207 IU/L (ref 121–224)
LDL Chol Calc (NIH): 148 mg/dL — ABNORMAL HIGH (ref 0–99)
Lymphocytes Absolute: 1.7 10*3/uL (ref 0.7–3.1)
Lymphs: 31 %
MCH: 25.4 pg — ABNORMAL LOW (ref 26.6–33.0)
MCHC: 32.1 g/dL (ref 31.5–35.7)
MCV: 79 fL (ref 79–97)
Monocytes Absolute: 0.5 10*3/uL (ref 0.1–0.9)
Monocytes: 9 %
Neutrophils Absolute: 3 10*3/uL (ref 1.4–7.0)
Neutrophils: 54 %
Phosphorus: 2.9 mg/dL (ref 2.8–4.1)
Platelets: 314 10*3/uL (ref 150–450)
Potassium: 4.4 mmol/L (ref 3.5–5.2)
Prostate Specific Ag, Serum: <0.1 ng/mL (ref 0.0–4.0)
RBC: 5.58 x10E6/uL (ref 4.14–5.80)
RDW: 15 % (ref 11.6–15.4)
Sodium: 139 mmol/L (ref 134–144)
T3 Uptake Ratio: 21 % — ABNORMAL LOW (ref 24–39)
T4, Total: 6.2 ug/dL (ref 4.5–12.0)
TSH: 2.4 u[IU]/mL (ref 0.450–4.500)
Total Protein: 7.1 g/dL (ref 6.0–8.5)
Triglycerides: 109 mg/dL (ref 0–149)
Uric Acid: 6.1 mg/dL (ref 3.8–8.4)
VLDL Cholesterol Cal: 20 mg/dL (ref 5–40)
WBC: 5.6 10*3/uL (ref 3.4–10.8)

## 2019-12-31 LAB — HGB A1C W/O EAG: Hgb A1c MFr Bld: 8.8 % — ABNORMAL HIGH (ref 4.8–5.6)

## 2020-01-09 ENCOUNTER — Other Ambulatory Visit: Payer: Self-pay

## 2020-01-09 ENCOUNTER — Ambulatory Visit: Payer: Self-pay | Admitting: Nurse Practitioner

## 2020-01-09 ENCOUNTER — Encounter: Payer: Self-pay | Admitting: Nurse Practitioner

## 2020-01-09 VITALS — BP 144/88 | HR 91 | Temp 98.2°F | Resp 16 | Ht 73.0 in | Wt 283.0 lb

## 2020-01-09 DIAGNOSIS — E119 Type 2 diabetes mellitus without complications: Secondary | ICD-10-CM

## 2020-01-09 MED ORDER — METFORMIN HCL 850 MG PO TABS: 850.0000 mg | ORAL_TABLET | Freq: Two times a day (BID) | ORAL | 0 refills | Status: DC

## 2020-01-09 NOTE — Progress Notes (Signed)
Pt presents today to complete physical with Kristian Covey.

## 2020-01-09 NOTE — Progress Notes (Signed)
Subjective:    Patient ID: Daniel Blase., male    DOB: [birth year], 64 y.o.   MRN: [id number]  HPI   64 year old male presenting today for annual physical. He is retired from [employer].   States over the past year he has gained weight.   Continues to exercise- has gym membership walks on treadmill and does weight lifting.   Notes diet has worsened if anything, eats fast food regularly, does drink sweet tea and soda.   A1C has increased from 7.5 to 8.8 fasting glucose is 195. Has not been checking BG at home, old machine broke- he purchased a new machine yesterday but has not set it up yet.   Cholesterol has also increased within the past year total increased to 219 (from 149) LDL increased to 148 (from 83). Patient is unsure of dosage he is taking of Lipitor- was noted to have dose increase in 06/2019.   Patient has had a prostatectomy-2015 surgery performed in [place] no records in chart  States he had Colonoscopy this year- no record in current chart.  Does not visit eye doctor, wears reading glasses he purchases OTC at pharmacy  Does visit [facility] annually- has lower implants   Denies a history of smoking.    Sees Urology for mass in bladder most recent visit was 08/2019 with CT pelvis at the time. Recommendation is for follow up scan in one year.   Right knee arthroplasty 05/2019 follow up with Ortho is scheduled for tomorrow 01/10/20  S/P Coronary stent placement 2019 s/p MI he follows up with [facility] Cardiology was seen 12/28/19 note reviewed   Denies alcohol use   Today's Vitals   01/09/20 1001  BP: (!) 144/88  Pulse: 91  Resp: 16  Temp: 98.2 F (36.8 C)  SpO2: 96%  Weight: 283 lb (128.4 kg)  Height: '6\' 1"'  (1.854 m)   Body mass index is 37.34 kg/m.   Past Medical History:  Diagnosis Date  . Arthritis of knee   . 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 Coronary artery disease   . Decreased libido 01/10/2013  . Depression   . DM  (diabetes mellitus) ([place])   . 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 Elevated prostate specific antigen (PSA) 02/21/2013  . Hemorrhoid   . History of kidney stones   . Hyperlipidemia   . Hypertension   . Incomplete bladder emptying 01/10/2013  . Malignant neoplasm of prostate ([place]) 04/28/2013   prostate removed  . Myocardial infarction ([place]) 2019   2 stents placed  . Obstructive sleep apnea on CPAP    does not use cpap, does not snore much since loosing 80 lbs   Current Outpatient Medications  Medication Instructions  . atorvastatin (LIPITOR) 80 mg, Oral, Daily  . Lancets (ONETOUCH DELICA PLUS [id number]) MISC Topical  . metFORMIN (GLUCOPHAGE) 850 mg, Oral, 2 times daily with meals  . metoprolol tartrate (LOPRESSOR) 25 mg, Oral, 2 times daily  . ONETOUCH VERIO test strip SMARTSIG:Via Meter  . oxybutynin (DITROPAN XL) 10 mg, Oral, Daily  . sildenafil (REVATIO) 20 MG tablet 20 mg 3-5 times daily as needed      Objective:   Physical Exam Constitutional:      Appearance: He is obese.  HENT:     Head: Normocephalic.     Right Ear: Tympanic membrane, ear canal and external ear normal.     Left Ear: Tympanic membrane, ear canal and external ear normal.  Nose: Nose normal.     Mouth/Throat:     Mouth: Mucous membranes are moist.     Pharynx: Oropharynx is clear.  Eyes:     Extraocular Movements: Extraocular movements intact.     Conjunctiva/sclera: Conjunctivae normal.     Pupils: Pupils are equal, round, and reactive to light.  Cardiovascular:     Rate and Rhythm: Normal rate and regular rhythm.     Heart sounds: Normal heart sounds.  Musculoskeletal:     Cervical back: Normal range of motion.  Neurological:     Mental Status: He is alert.    Recent Results (from the past 2160 hour(s))  CMP12+LP+TP+TSH+6AC+PSA+CBC.     Status: Abnormal   Collection Time: 12/30/19  8:50 AM  Result Value Ref Range   Glucose 195 (H) 65 - 99 mg/dL   Uric Acid 6.1 3.8 -  8.4 mg/dL    Comment:            Therapeutic target for gout patients: <6.0   BUN 16 8 - 27 mg/dL   Creatinine, Ser 1.04 0.76 - 1.27 mg/dL   GFR calc non Af Amer 76 >59 mL/min/1.73   GFR calc Af Amer 87 >59 mL/min/1.73    Comment: **In accordance with recommendations from the NKF-ASN Task force,**   [facility] is in the process of updating its eGFR calculation to the   2021 CKD-EPI creatinine equation that estimates kidney function   without a race variable.    BUN/Creatinine Ratio 15 10 - 24   Sodium 139 134 - 144 mmol/L   Potassium 4.4 3.5 - 5.2 mmol/L   Chloride 103 96 - 106 mmol/L   Calcium 9.6 8.6 - 10.2 mg/dL   Phosphorus 2.9 2.8 - 4.1 mg/dL   Total Protein 7.1 6.0 - 8.5 g/dL   Albumin 4.6 3.8 - 4.8 g/dL   Globulin, Total 2.5 1.5 - 4.5 g/dL   Albumin/Globulin Ratio 1.8 1.2 - 2.2   Bilirubin Total 0.6 0.0 - 1.2 mg/dL   Alkaline Phosphatase 114 44 - 121 IU/L    Comment:               **Please note reference interval change**   LDH 207 121 - 224 IU/L   AST 22 0 - 40 IU/L   ALT 21 0 - 44 IU/L   GGT 55 0 - 65 IU/L   Iron 71 38 - 169 ug/dL   Cholesterol, Total 219 (H) 100 - 199 mg/dL   Triglycerides 109 0 - 149 mg/dL   HDL 51 >39 mg/dL   VLDL Cholesterol Cal 20 5 - 40 mg/dL   LDL Chol Calc (NIH) 148 (H) 0 - 99 mg/dL   Chol/HDL Ratio 4.3 0.0 - 5.0 ratio    Comment:                                   T. Chol/HDL Ratio                                             Men  Women                               1/2 Avg.Risk  3.4    3.3  Avg.Risk  5.0    4.4                                2X Avg.Risk  9.6    7.1                                3X Avg.Risk 23.4   11.0    Estimated CHD Risk 0.8 0.0 - 1.0 times avg.    Comment: The CHD Risk is based on the T. Chol/HDL ratio. Other factors affect CHD Risk such as hypertension, smoking, diabetes, severe obesity, and family history of premature CHD.    TSH 2.400 0.450 - 4.500 uIU/mL   T4, Total 6.2 4.5 - 12.0  ug/dL   T3 Uptake Ratio 21 (L) 24 - 39 %   Free Thyroxine Index 1.3 1.2 - 4.9   Prostate Specific Ag, Serum <0.1 0.0 - 4.0 ng/mL    Comment: Roche ECLIA methodology. According to the American Urological Association, Serum PSA should decrease and remain at undetectable levels after radical prostatectomy. The AUA defines biochemical recurrence as an initial PSA value 0.2 ng/mL or greater followed by a subsequent confirmatory PSA value 0.2 ng/mL or greater. Values obtained with different assay methods or kits cannot be used interchangeably. Results cannot be interpreted as absolute evidence of the presence or absence of malignant disease.    WBC 5.6 3.4 - 10.8 x10E3/uL   RBC 5.58 4.14 - 5.80 x10E6/uL   Hemoglobin 14.2 13.0 - 17.7 g/dL   Hematocrit 44.3 37.5 - 51.0 %   MCV 79 79 - 97 fL   MCH 25.4 (L) 26.6 - 33.0 pg   MCHC 32.1 31 - 35 g/dL   RDW 15.0 11.6 - 15.4 %   Platelets 314 150 - 450 x10E3/uL   Neutrophils 54 Not Estab. %   Lymphs 31 Not Estab. %   Monocytes 9 Not Estab. %   Eos 5 Not Estab. %   Basos 1 Not Estab. %   Neutrophils Absolute 3.0 1.40 - 7.00 x10E3/uL   Lymphocytes Absolute 1.7 0 - 3 x10E3/uL   Monocytes Absolute 0.5 0 - 0 x10E3/uL   EOS (ABSOLUTE) 0.3 0.0 - 0.4 x10E3/uL   Basophils Absolute 0.1 0 - 0 x10E3/uL   Immature Granulocytes 0 Not Estab. %   Immature Grans (Abs) 0.0 0.0 - 0.1 x10E3/uL  Hgb A1c w/o eAG     Status: Abnormal   Collection Time: 12/30/19  8:50 AM  Result Value Ref Range   Hgb A1c MFr Bld 8.8 (H) 4.8 - 5.6 %    Comment:          Prediabetes: 5.7 - 6.4          Diabetes: >6.4          Glycemic control for adults with diabetes: <7.0   POCT urinalysis dipstick     Status: Abnormal   Collection Time: 12/30/19  9:59 AM  Result Value Ref Range   Color, UA yellow    Clarity, UA clear    Glucose, UA Negative Negative   Bilirubin, UA negative    Ketones, UA negative    Spec Grav, UA >=1.030 (A) 1.010 - 1.025   Blood, UA negative    pH, UA  5.0 5.0 - 8.0   Protein, UA Negative Negative   Urobilinogen, UA 0.2 0.2 or 1.0 E.U./dL  Nitrite, UA negative    Leukocytes, UA Negative Negative   Appearance medium    Odor           Assessment & Plan:  A1C has increased as has weight, discussed dietary recommendations at length with patient, carbohydrate decrease, stopping soda/sweet tea intake. Will increase Metformin to 850 BID and recheck A1C in three months.  Advised on importance of annual eye exam with DM, patient will schedule appointment    Lipids are increasing, asked patient to verify he is taking correct dosage of Lipitor (86m) if this is correct- will recheck in three months with next A1C and continue with plan advised by Cardiology to start Zetia at that time- see recommendation below  Cardiology recommendation from 12/28/19:  "Continue atorvastatin 80 mg daily with an LDL goal less than 70; discussed adding Zetia for optimal control, however, patient defers and wishes to work on dietary changes first"   - At next visit verify history of colonoscopy could not find records in chart.  Meds ordered this encounter  Medications  . metFORMIN (GLUCOPHAGE) 850 MG tablet    Sig: Take 1 tablet (850 mg total) by mouth 2 (two) times daily with a meal.    Dispense:  90 tablet    Refill:  0

## 2020-01-10 DIAGNOSIS — Z96652 Presence of left artificial knee joint: Secondary | ICD-10-CM | POA: Diagnosis not present

## 2020-01-23 ENCOUNTER — Other Ambulatory Visit: Payer: Self-pay

## 2020-01-23 ENCOUNTER — Encounter: Payer: Self-pay | Admitting: Nurse Practitioner

## 2020-01-23 ENCOUNTER — Ambulatory Visit: Payer: Self-pay | Admitting: Nurse Practitioner

## 2020-01-23 VITALS — BP 141/80 | HR 77 | Temp 97.7°F | Resp 14 | Ht 73.0 in | Wt 286.0 lb

## 2020-01-23 DIAGNOSIS — E119 Type 2 diabetes mellitus without complications: Secondary | ICD-10-CM

## 2020-01-23 MED ORDER — ONETOUCH DELICA PLUS LANCET33G MISC: 1.0000 | Freq: Two times a day (BID) | 6 refills | Status: AC

## 2020-01-23 MED ORDER — ONETOUCH VERIO VI STRP: 1.0000 | ORAL_STRIP | Freq: Two times a day (BID) | 6 refills | Status: AC

## 2020-01-23 NOTE — Patient Instructions (Signed)

## 2020-01-23 NOTE — Progress Notes (Signed)
Follow-up from 01/09/20 visit - brought in list is glucose readings.

## 2020-01-23 NOTE — Progress Notes (Signed)
   Subjective:    Patient ID: Daniel Blase., male    DOB: [birth year], 64 y.o.   MRN: [id number]  HPI 64 year old male returning to clinic today after increasing Metformin to 850mg  2 weeks ago.   He has been keeping a log at home of BP and blood glucose with new monitor.   BP ranges from 123/84-150/96 Glucose ranges from 123-200  Denies any episodes of hypoglycemia.   He is making lifestyle changes, looked up recipes on youtube, has changes his grocery shopping style and has not eaten fast food since appointment 2 weeks ago.   He is very motivated to loose weight. Continues daily exercise program .  Today's Vitals   01/23/20 1115  BP: (!) 141/80  Pulse: 77  Resp: 14  Temp: 97.7 F (36.5 C)  TempSrc: Oral  SpO2: 96%  Weight: 286 lb (129.7 kg)  Height: 6\' 1"  (1.854 m)   Body mass index is 37.73 kg/m.    Review of Systems     Objective:   Physical Exam Neurological:     General: No focal deficit present.     Mental Status: He is alert and oriented to person, place, and time.  Psychiatric:        Mood and Affect: Mood normal.        Behavior: Behavior normal.        Thought Content: Thought content normal.        Judgment: Judgment normal.           Assessment & Plan:  Spent time with patient checking blood glucose with home monitor.  Discussing dietary changes.  Sent information on foods with cholesterol to avoid  He will continue to monitor blood glucose twice daily and keep log Continue to monitor BP at home and keep log   RTC in 3 months for A1C and BP check in   Return to clinic earlier if blood glucose is reading <100 or > 200 as discussed Or if BP is >140 >90 consistently   Discussed potential referral to lifestyle center for continuing counseling on lifestyle changed for HTN/hyperlipidemia and DM. He would like to try a 3 month attempt on his own first and discuss this at next appointment after reviewing labs at that time.   Patient is very  motivated and understands plan well.

## 2020-02-02 IMAGING — CR DG CHEST 2V
2 series · 2 of 2 positions shown · non-contrast
Comparison: None.

CLINICAL DATA: Chest tightness

EXAM:
CHEST - 2 VIEW

[chest pa]
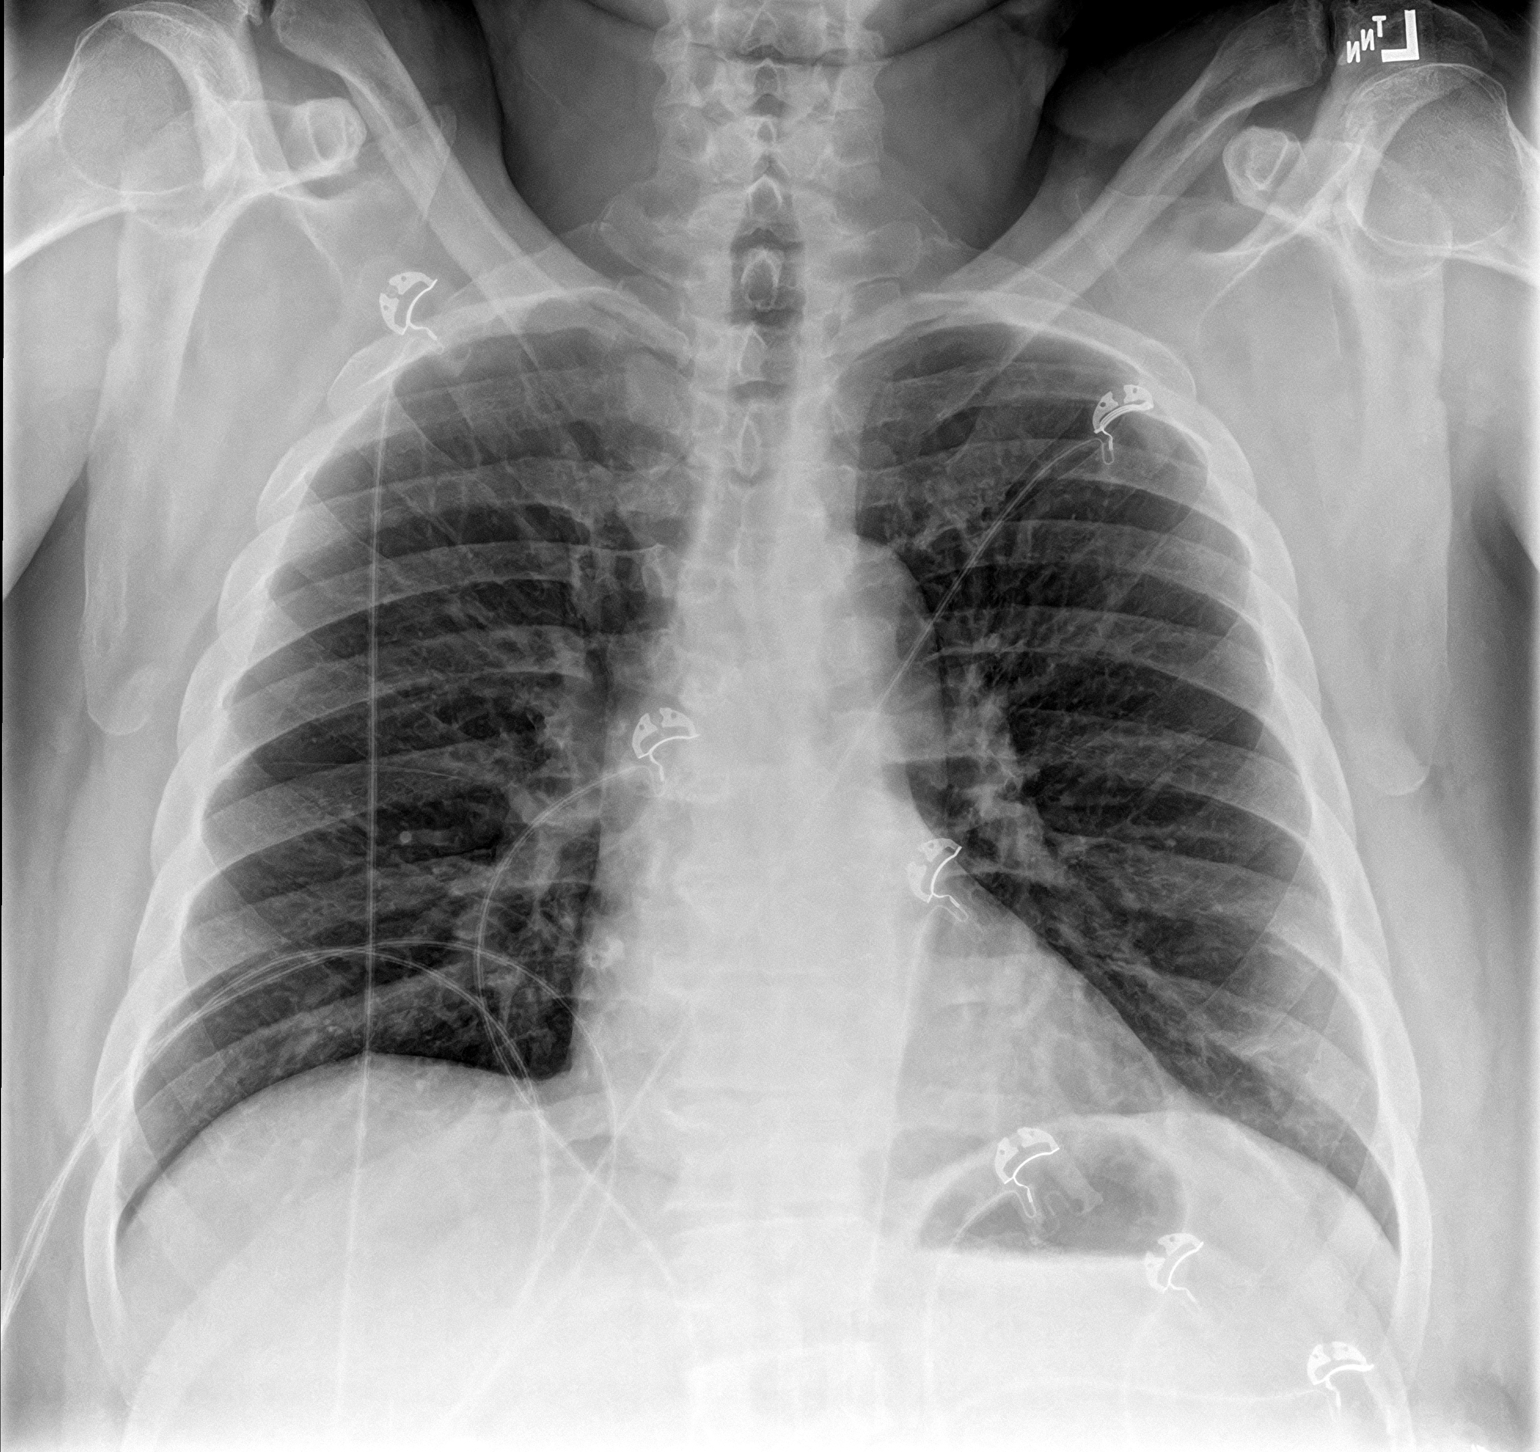

[chest lat]
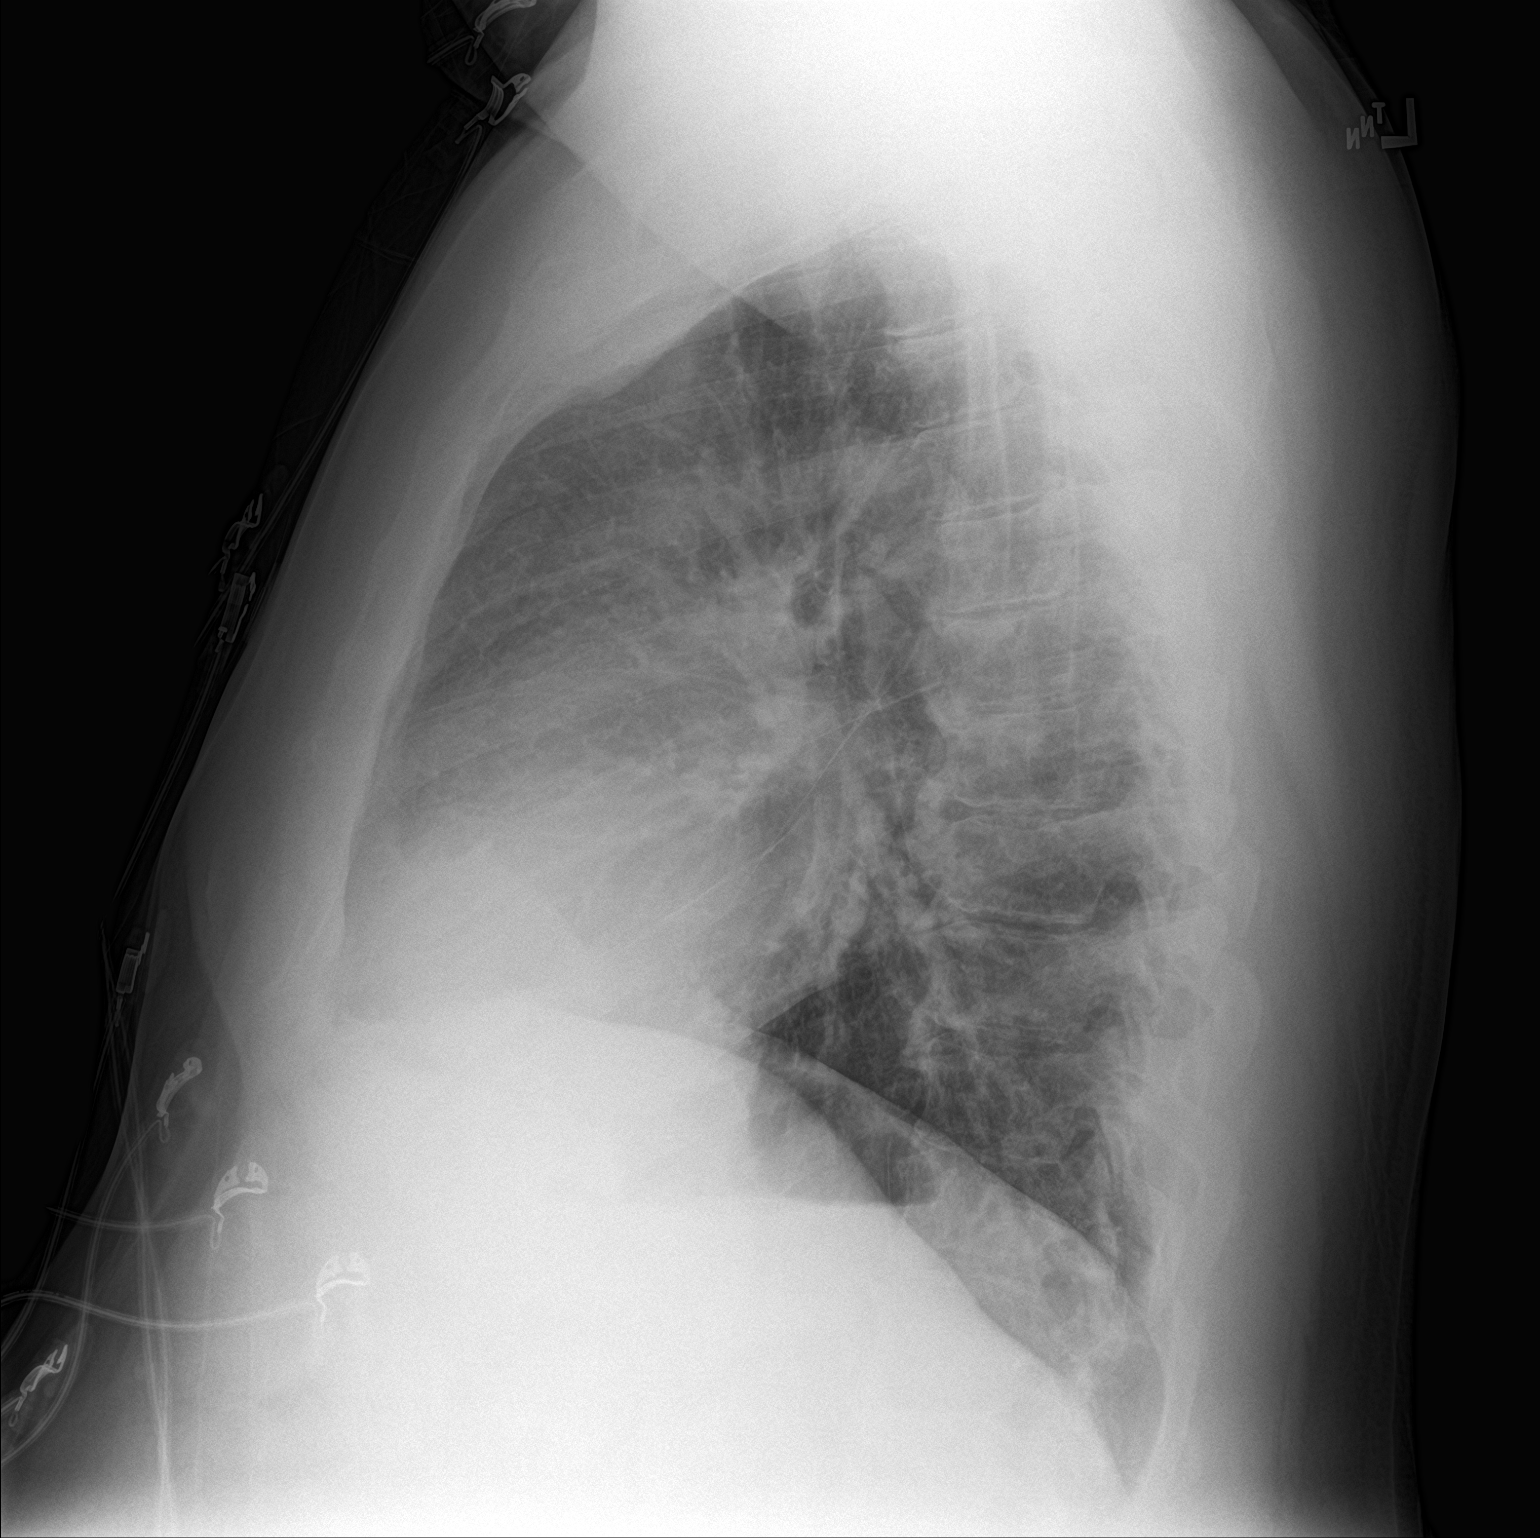

[2 of 2 positions shown; findings below may reference images not displayed]

FINDINGS: Lungs are clear. Heart size and pulmonary vascularity are normal. No
adenopathy. No pneumothorax. There is degenerative change in the
thoracic spine.
IMPRESSION: No edema or consolidation.

## 2020-02-29 ENCOUNTER — Other Ambulatory Visit: Payer: Self-pay | Admitting: Nurse Practitioner

## 2020-02-29 DIAGNOSIS — E119 Type 2 diabetes mellitus without complications: Secondary | ICD-10-CM

## 2020-03-02 ENCOUNTER — Other Ambulatory Visit: Payer: Self-pay

## 2020-03-02 ENCOUNTER — Other Ambulatory Visit: Payer: Self-pay | Admitting: Registered Nurse

## 2020-03-02 DIAGNOSIS — E119 Type 2 diabetes mellitus without complications: Secondary | ICD-10-CM

## 2020-03-06 ENCOUNTER — Other Ambulatory Visit: Payer: Self-pay

## 2020-03-06 DIAGNOSIS — E119 Type 2 diabetes mellitus without complications: Secondary | ICD-10-CM

## 2020-03-07 MED ORDER — METFORMIN HCL 850 MG PO TABS: 850.0000 mg | ORAL_TABLET | Freq: Two times a day (BID) | ORAL | 2 refills | Status: DC

## 2020-03-07 NOTE — Telephone Encounter (Signed)
Continue to follow HbA1c,  Note: check pancreatic reserve

## 2020-03-26 NOTE — Progress Notes (Deleted)
Pt presents today for 3 month lab f/u per Kristian Covey

## 2020-03-28 ENCOUNTER — Other Ambulatory Visit: Payer: 59

## 2020-03-28 DIAGNOSIS — E7800 Pure hypercholesterolemia, unspecified: Secondary | ICD-10-CM

## 2020-03-28 DIAGNOSIS — E119 Type 2 diabetes mellitus without complications: Secondary | ICD-10-CM

## 2020-03-29 NOTE — Progress Notes (Signed)
Scheduled to review lab results 04/04/20 with Dr. Cecil Cobbs.  AMD

## 2020-03-30 ENCOUNTER — Other Ambulatory Visit: Payer: Self-pay

## 2020-03-30 DIAGNOSIS — E119 Type 2 diabetes mellitus without complications: Secondary | ICD-10-CM

## 2020-03-30 DIAGNOSIS — Z0000 Encounter for general adult medical examination without abnormal findings: Secondary | ICD-10-CM

## 2020-03-30 DIAGNOSIS — E7849 Other hyperlipidemia: Secondary | ICD-10-CM

## 2020-03-31 LAB — CMP12+LP+TP+TSH+6AC+PSA+CBC…
ALT: 21 IU/L (ref 0–44)
AST: 17 IU/L (ref 0–40)
Albumin/Globulin Ratio: 1.7 (ref 1.2–2.2)
Albumin: 4.3 g/dL (ref 3.8–4.8)
Alkaline Phosphatase: 117 IU/L (ref 44–121)
BUN/Creatinine Ratio: 15 (ref 10–24)
BUN: 14 mg/dL (ref 8–27)
Basophils Absolute: 0.1 10*3/uL (ref 0.0–0.2)
Basos: 1 %
Bilirubin Total: 0.5 mg/dL (ref 0.0–1.2)
Calcium: 9.7 mg/dL (ref 8.6–10.2)
Chloride: 104 mmol/L (ref 96–106)
Chol/HDL Ratio: 3.1 ratio (ref 0.0–5.0)
Cholesterol, Total: 128 mg/dL (ref 100–199)
Creatinine, Ser: 0.93 mg/dL (ref 0.76–1.27)
EOS (ABSOLUTE): 0.3 10*3/uL (ref 0.0–0.4)
Eos: 4 %
Estimated CHD Risk: <0.5 times avg. (ref 0.0–1.0)
Free Thyroxine Index: 1.5 (ref 1.2–4.9)
GFR calc Af Amer: 100 mL/min/{1.73_m2} (ref >59)
GFR calc non Af Amer: 86 mL/min/{1.73_m2} (ref >59)
GGT: 37 IU/L (ref 0–65)
Globulin, Total: 2.6 g/dL (ref 1.5–4.5)
Glucose: 149 mg/dL — ABNORMAL HIGH (ref 65–99)
HDL: 41 mg/dL (ref >39)
Hematocrit: 42.5 % (ref 37.5–51.0)
Hemoglobin: 14 g/dL (ref 13.0–17.7)
Immature Grans (Abs): 0 10*3/uL (ref 0.0–0.1)
Immature Granulocytes: 1 %
Iron: 68 ug/dL (ref 38–169)
LDH: 180 IU/L (ref 121–224)
LDL Chol Calc (NIH): 74 mg/dL (ref 0–99)
Lymphocytes Absolute: 1.8 10*3/uL (ref 0.7–3.1)
Lymphs: 28 %
MCH: 26 pg — ABNORMAL LOW (ref 26.6–33.0)
MCHC: 32.9 g/dL (ref 31.5–35.7)
MCV: 79 fL (ref 79–97)
Monocytes Absolute: 0.5 10*3/uL (ref 0.1–0.9)
Monocytes: 8 %
Neutrophils Absolute: 3.7 10*3/uL (ref 1.4–7.0)
Neutrophils: 58 %
Phosphorus: 2.8 mg/dL (ref 2.8–4.1)
Platelets: 306 10*3/uL (ref 150–450)
Potassium: 4.4 mmol/L (ref 3.5–5.2)
Prostate Specific Ag, Serum: <0.1 ng/mL (ref 0.0–4.0)
RBC: 5.39 x10E6/uL (ref 4.14–5.80)
RDW: 14.6 % (ref 11.6–15.4)
Sodium: 139 mmol/L (ref 134–144)
T3 Uptake Ratio: 24 % (ref 24–39)
T4, Total: 6.4 ug/dL (ref 4.5–12.0)
TSH: 2.43 u[IU]/mL (ref 0.450–4.500)
Total Protein: 6.9 g/dL (ref 6.0–8.5)
Triglycerides: 60 mg/dL (ref 0–149)
Uric Acid: 5.2 mg/dL (ref 3.8–8.4)
VLDL Cholesterol Cal: 13 mg/dL (ref 5–40)
WBC: 6.4 10*3/uL (ref 3.4–10.8)

## 2020-03-31 LAB — MICROALBUMIN / CREATININE URINE RATIO
Creatinine, Urine: 222.1 mg/dL
Microalb/Creat Ratio: 5 mg/g creat (ref 0–29)
Microalbumin, Urine: 10.5 ug/mL

## 2020-03-31 LAB — HGB A1C W/O EAG: Hgb A1c MFr Bld: 8.1 % — ABNORMAL HIGH (ref 4.8–5.6)

## 2020-04-04 ENCOUNTER — Ambulatory Visit: Payer: 59

## 2020-04-05 ENCOUNTER — Ambulatory Visit: Payer: Self-pay | Admitting: Physician Assistant

## 2020-04-05 ENCOUNTER — Encounter: Payer: Self-pay | Admitting: Physician Assistant

## 2020-04-05 ENCOUNTER — Other Ambulatory Visit: Payer: Self-pay

## 2020-04-05 VITALS — BP 144/83 | HR 89 | Temp 97.7°F | Resp 14 | Ht 73.0 in | Wt 286.0 lb

## 2020-04-05 DIAGNOSIS — Z0000 Encounter for general adult medical examination without abnormal findings: Secondary | ICD-10-CM

## 2020-04-05 LAB — POCT URINALYSIS DIPSTICK
Bilirubin, UA: NEGATIVE
Blood, UA: NEGATIVE
Glucose, UA: NEGATIVE
Ketones, UA: NEGATIVE
Leukocytes, UA: NEGATIVE
Nitrite, UA: NEGATIVE
Protein, UA: NEGATIVE
Spec Grav, UA: >=1.03 — AB
Urobilinogen, UA: 0.2 U/dL
pH, UA: 5.5

## 2020-04-05 NOTE — Progress Notes (Signed)
° °  Subjective: Annual physical exam    Patient ID: Daniel Brisbin., male    DOB: 1955-02-19, 65 y.o.   MRN: [id number]  HPI Patient presents annual physical exam.  Patient started lifestyle changes secondary to glucose not being controlled on his last visit December 2021.  In the past 3 months patient has increased his Metformin from 500 twice daily to 850 mg twice daily.  Patient has become aggressive with lifestyle modifications.  Patient denies signs or symptoms of peripheral neuropathy.  Review of Systems    Diabetes, hyperlipidemia, and hypertension. Objective:   Physical Exam  No acute distress.  BP is 144/83, pulse 89, respiration 14, temperature 97.7.  Hemoglobin A1c has decreased from 8.8-8.1 since November 2021.  Cholesterol has decreased from 219-128, triglycerides has decreased from 109-60 HDL 41,VLDL has decreased from 20-13, and LDL has decreased from 148-74. BMI is 37.73. HEENT is unremarkable.  Neck is supple for adenopathy or bruits.  Lungs are clear to auscultation.  Heart regular rate and rhythm.  Abdominal distention secondary to body habitus, normoactive bowel sounds, soft and nontender to palpation.  No gross deformity to the upper or lower extremities.  Patient has full and equal range of motion of the upper and lower extremities.  No obvious cervical or lumbar spine deformity.  Patient has full and equal range of motion of the cervical lumbar spine.  Cranial nerves II through XII grossly intact.      Assessment & Plan: Well exam.  Discussed lab results with patient.  Continue previous medications and lifestyle modifications.  Follow-up in 3 months.

## 2020-07-03 ENCOUNTER — Ambulatory Visit: Payer: 59

## 2020-07-19 DIAGNOSIS — I214 Non-ST elevation (NSTEMI) myocardial infarction: Secondary | ICD-10-CM | POA: Diagnosis not present

## 2020-07-19 DIAGNOSIS — E119 Type 2 diabetes mellitus without complications: Secondary | ICD-10-CM | POA: Diagnosis not present

## 2020-07-19 DIAGNOSIS — I2510 Atherosclerotic heart disease of native coronary artery without angina pectoris: Secondary | ICD-10-CM | POA: Diagnosis not present

## 2020-07-19 DIAGNOSIS — E669 Obesity, unspecified: Secondary | ICD-10-CM | POA: Diagnosis not present

## 2020-07-19 DIAGNOSIS — I10 Essential (primary) hypertension: Secondary | ICD-10-CM | POA: Diagnosis not present

## 2020-07-25 DIAGNOSIS — Z20822 Contact with and (suspected) exposure to covid-19: Secondary | ICD-10-CM | POA: Diagnosis not present

## 2020-09-09 NOTE — Progress Notes (Shared)
09/11/2020 2:34 PM   Daniel Leyland Jr. 23-Sep-1955 MS:[id number]  Referring provider: No referring provider defined for this encounter. No chief complaint on file.   HPI: Daniel Barrueta. is a 65 y.o. male with PMHx of gleason 3+4 prostate cancer s/p prostatectomy in 2019. He presents today for 1 year follow-up and review of pelvis CT.   05/26/19 presented a case at tumor board, it was recommended to pursue active surveillance w/3 month f/u scan.   Last seen in office on 09/06/19. 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 Bilateral nephrolithiasis and left-sided obstructive/inflammatory changes related to passage of a 4 mm stone in bladder were also noted.   Patient had a previous robotic prostate cancer surgery in [place].   Pelvis CT on 08/31/2019 showed persistent soft tissue density 1.6 cm lesion in the midline anterior bladder wall with coarse peripheral calcification, unchanged since 04/03/2019. Differential includes inflammatory or neoplastic etiologies, although the stability and presence of coarse calcification may indicate a benign inflammatory lesion. No pelvic lymphadenopathy. Mild sigmoid diverticulosis.     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   Elevated prostate specific antigen (PSA) 02/21/2013   Hemorrhoid    History of kidney stones    Hyperlipidemia    Hypertension    Incomplete bladder emptying 01/10/2013   Malignant neoplasm of prostate ([place]) 04/28/2013   prostate removed   Myocardial infarction ([place]) 2019   2 stents placed   Obstructive sleep apnea on CPAP    does not use cpap, does not snore much since loosing 80 lbs     Surgical History: Past Surgical History:  Procedure Laterality Date   COLONOSCOPY     CORONARY STENT INTERVENTION N/A 08/20/2017   Procedure: CORONARY STENT INTERVENTION;  Surgeon: Yolonda Kida, MD;  Location: [facility];  Service: Cardiovascular;  Laterality: N/A;   dental implants     DG KNEE RIGHT COMPLETE ([place] HX) Right 05/23/2019   KNEE ARTHROPLASTY Left 05/23/2019   Procedure: COMPUTER ASSISTED TOTAL KNEE ARTHROPLASTY;  Surgeon: Dereck Leep, MD;  Location: [facility];  Service: Orthopedics;  Laterality: Left;   LEFT HEART CATH AND CORONARY ANGIOGRAPHY N/A 08/20/2017   Procedure: LEFT HEART CATH AND CORONARY ANGIOGRAPHY;  Surgeon: Teodoro Spray, MD;  Location: [facility];  Service: Cardiovascular;  Laterality: N/A;   PROSTATE SURGERY  07/14/2013   Prostatectomy    Home Medications:  Allergies as of 09/11/2020   No Known Allergies      Medication List        Accurate as of September 09, 2020  2:34 PM. If you have any questions, ask your nurse or doctor.          aspirin EC 81 MG tablet 81 mg daily. Swallow whole.   atorvastatin 80 MG tablet Commonly known as: LIPITOR Take 80 mg by mouth daily.   Cholecalciferol 125 MCG (5000 UT) capsule Take by mouth.   diphenhydramine-acetaminophen 25-500 MG Tabs tablet Commonly known as: TYLENOL PM Take 2 tablets by mouth at bedtime as needed.   metFORMIN 850 MG tablet Commonly known as: Glucophage Take 1 tablet (850  mg total) by mouth 2 (two) times daily with a meal.   metoprolol tartrate 25 MG tablet Commonly known as: LOPRESSOR Take 1 tablet (25 mg total) by mouth 2 (two) times daily.   senna 8.6 MG tablet Commonly known as: SENOKOT Take 2 tablets by mouth daily.        Allergies: No Known Allergies  Family History: Family History  Problem Relation Age of Onset   Pancreatic cancer Brother    Alcoholism Mother    Alcoholism Father    Prostate cancer Neg Hx    Kidney disease Neg  Hx     Social History:  reports that he has never smoked. He has never used smokeless tobacco. He reports that he does not drink alcohol and does not use drugs.   Physical Exam: There were no vitals taken for this visit.  Constitutional:  Alert and oriented, No acute distress. HEENT: [place] AT, moist mucus membranes.  Trachea midline, no masses. Cardiovascular: No clubbing, cyanosis, or edema. Respiratory: Normal respiratory effort, no increased work of breathing. GI: Abdomen is soft, nontender, nondistended, no abdominal masses GU: No CVA tenderness Lymph: No cervical or inguinal lymphadenopathy. Skin: No rashes, bruises or suspicious lesions. Neurologic: Grossly intact, no focal deficits, moving all 4 extremities. Psychiatric: Normal mood and affect.  Laboratory Data:  Lab Results  Component Value Date   CREATININE 0.93 03/30/2020    No results found for: PSA  No results found for: TESTOSTERONE  Lab Results  Component Value Date   HGBA1C 8.1 (H) 03/30/2020      Pertinent Imaging:  ***   I have personally reviewed the images and agree with radiologist interpretation.   Assessment & Plan:   Bladder ***  2. ED  No follow-ups on file.  I,Kailey Littlejohn,acting as a Education administrator for Hollice Espy, MD.,have documented all relevant documentation on the behalf of Hollice Espy, MD,as directed by  Hollice Espy, MD while in the presence of Hollice Espy, [place] [address], [place], [place] [address] [phone]

## 2020-09-10 ENCOUNTER — Other Ambulatory Visit: Payer: Self-pay

## 2020-09-10 DIAGNOSIS — N3289 Other specified disorders of bladder: Secondary | ICD-10-CM

## 2020-09-10 DIAGNOSIS — Z8546 Personal history of malignant neoplasm of prostate: Secondary | ICD-10-CM

## 2020-09-11 ENCOUNTER — Ambulatory Visit: Payer: Self-pay | Admitting: Urology

## 2020-10-02 ENCOUNTER — Other Ambulatory Visit: Payer: Self-pay

## 2020-10-02 ENCOUNTER — Ambulatory Visit
Admission: RE | Admit: 2020-10-02 | Discharge: 2020-10-02 | Disposition: A | Discharge status: Home / Self Care | Payer: Medicare HMO | Source: Ambulatory Visit | Attending: Urology | Admitting: Urology

## 2020-10-02 DIAGNOSIS — Z8546 Personal history of malignant neoplasm of prostate: Secondary | ICD-10-CM | POA: Diagnosis not present

## 2020-10-02 DIAGNOSIS — N3289 Other specified disorders of bladder: Secondary | ICD-10-CM | POA: Insufficient documentation

## 2020-10-02 LAB — POCT I-STAT CREATININE: Creatinine, Ser: 0.9 mg/dL (ref 0.61–1.24)

## 2020-10-02 MED ORDER — IOHEXOL 350 MG/ML SOLN
100.0000 mL | Freq: Once | INTRAVENOUS | Status: AC | PRN
  Administered 2020-10-02: 100 mL via INTRAVENOUS

## 2020-10-04 ENCOUNTER — Telehealth: Payer: Self-pay

## 2020-10-04 NOTE — Telephone Encounter (Signed)
-----   Message from Hollice Espy, MD sent at 10/03/2020  4:27 PM EDT ----- Please schedule f/u with me, had CT scan done but does not have apt.  Can be virtual video if desired.  Hollice Espy, MD

## 2020-10-04 NOTE — Telephone Encounter (Signed)
Pt verbalized understanding. Would rather in person visit. Pt scheduled for 10/17/2020

## 2020-10-16 NOTE — Progress Notes (Incomplete)
10/16/20 10:53 AM   Daniel Bradley. Jul 09, 1955 OR:[id number]  Referring provider:  No referring provider defined for this encounter. No chief complaint on file.    HPI: Daniel Bradley. is a 65 y.o.male with a personal history of prostate cancer ,  who presents today for CT results.   He is s/p radical prostatectomy in 2015. His pathology is Gleason 3+4.   ED visit 04/03/19 c/o of abdominal pain, emesis, and kidney stones.  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 Bilateral nephrolithiasis and left-sided obstructive/inflammatory changes related to passage of a 4 mm stone in bladder were also noted.    He had a previous robotic prostate cancer surgery in [place]. No access to records yet.   Cystoscopy on 05/18/2019 was completely unremarkable.  Presented case at Tumor board on 05/26/2019 where it was recommended to pursue active surveillance w/ 3 month f/u scan. Felt to be post surgical rather than neoplastic.   Pelvis CT on 08/31/2019 showed persistent soft tissue density 1.6 cm lesion in the midline anterior bladder wall with coarse peripheral calcification, unchanged since 04/03/2019 CT. Differential includes inflammatory or neoplastic etiologies, although the stability and presence of coarse calcification may indicate a benign inflammatory lesion. No pelvic lymphadenopathy. Mild sigmoid diverticulosis.  Pelvis CT with contrast on 10/02/2020 revealed is a rounded, peripherally calcified lesion of the anterior bladder wall, measuring 1.6 cm (series 5, image 93, series 2, image 32). No other abnormality visualized.    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   Elevated prostate specific antigen (PSA) 02/21/2013   Hemorrhoid    History of kidney stones    Hyperlipidemia    Hypertension    Incomplete bladder emptying 01/10/2013   Malignant neoplasm of prostate ([place]) 04/28/2013   prostate removed   Myocardial infarction ([place]) 2019   2 stents placed   Obstructive sleep apnea on CPAP    does not use cpap, does not snore much since loosing 80 lbs    Surgical History: Past Surgical History:  Procedure Laterality Date   COLONOSCOPY     CORONARY STENT INTERVENTION N/A 08/20/2017   Procedure: CORONARY STENT INTERVENTION;  Surgeon: Yolonda Kida, MD;  Location: [facility];  Service: Cardiovascular;  Laterality: N/A;   dental implants     DG KNEE RIGHT COMPLETE ([place] HX) Right 05/23/2019   KNEE ARTHROPLASTY Left 05/23/2019   Procedure: COMPUTER ASSISTED TOTAL KNEE ARTHROPLASTY;  Surgeon: Dereck Leep, MD;  Location: [facility];  Service: Orthopedics;  Laterality: Left;   LEFT HEART CATH AND CORONARY ANGIOGRAPHY N/A 08/20/2017   Procedure: LEFT HEART CATH AND CORONARY ANGIOGRAPHY;  Surgeon: Teodoro Spray, MD;  Location: [facility];  Service: Cardiovascular;  Laterality: N/A;   PROSTATE SURGERY  07/14/2013   Prostatectomy    Home Medications:  Allergies as of 10/17/2020   No Known Allergies      Medication List        Accurate as of October 16, 2020 10:53 AM. If you have any questions, ask your nurse or doctor.  aspirin EC 81 MG tablet 81 mg daily. Swallow whole.   atorvastatin 80 MG tablet Commonly known as: LIPITOR Take 80 mg by mouth daily.   Cholecalciferol 125 MCG (5000 UT) capsule Take by mouth.   diphenhydramine-acetaminophen 25-500 MG Tabs tablet Commonly known as: TYLENOL PM Take 2 tablets by mouth at bedtime as needed.   metFORMIN 850 MG tablet Commonly known as: Glucophage Take 1 tablet (850 mg total) by mouth 2 (two) times daily with a  meal.   metoprolol tartrate 25 MG tablet Commonly known as: LOPRESSOR Take 1 tablet (25 mg total) by mouth 2 (two) times daily.   senna 8.6 MG tablet Commonly known as: SENOKOT Take 2 tablets by mouth daily.        Allergies: No Known Allergies  Family History: Family History  Problem Relation Age of Onset   Pancreatic cancer Brother    Alcoholism Mother    Alcoholism Father    Prostate cancer Neg Hx    Kidney disease Neg Hx     Social History:  reports that he has never smoked. He has never used smokeless tobacco. He reports that he does not drink alcohol and does not use drugs.   Physical Exam: There were no vitals taken for this visit.  Constitutional:  Alert and oriented, No acute distress. HEENT: [place] AT, moist mucus membranes.  Trachea midline, no masses. Cardiovascular: No clubbing, cyanosis, or edema. Respiratory: Normal respiratory effort, no increased work of breathing. GI: Abdomen is soft, nontender, nondistended, no abdominal masses GU: No CVA tenderness Lymph: No cervical or inguinal lymphadenopathy. Skin: No rashes, bruises or suspicious lesions. Neurologic: Grossly intact, no focal deficits, moving all 4 extremities. Psychiatric: Normal mood and affect.  Laboratory Data:  Lab Results  Component Value Date   CREATININE 0.90 10/02/2020    No results found for: PSA  No results found for: TESTOSTERONE  Lab Results  Component Value Date   HGBA1C 8.1 (H) 03/30/2020    Urinalysis   Pertinent Imaging: CLINICAL DATA:  Follow-up bladder mass   EXAM: CT PELVIS WITH CONTRAST   TECHNIQUE: Multidetector CT imaging of the pelvis was performed using the standard protocol following the bolus administration of intravenous contrast.   CONTRAST:  138m OMNIPAQUE IOHEXOL 350 MG/ML SOLN, additional oral enteric contrast   COMPARISON:  08/31/2019, 04/03/2019   FINDINGS: Urinary Tract: There is a rounded, peripherally calcified lesion of the  anterior bladder wall, measuring 1.6 cm (series 5, image 93, series 2, image 32). No other abnormality visualized.   Bowel:  Unremarkable visualized pelvic bowel loops.   Vascular/Lymphatic: No pathologically enlarged lymph nodes. Aortic atherosclerosis.   Reproductive:  Status post prostatectomy.   Other:  None.   Musculoskeletal: No suspicious bone lesions identified.   IMPRESSION: 1. There is a rounded, peripherally calcified lesion of the anterior bladder wall, measuring 1.6 cm. This is unchanged compared to prior examinations and almost certainly benign given stability. 2. Status post prostatectomy. No evidence of lymphadenopathy or metastatic disease in the pelvis.   Aortic Atherosclerosis (ICD10-I70.0).     Electronically Signed   By: AEddie CandleM.D.   On: 10/03/2020 15:23   Assessment & Plan:   Bladder mass  ED   No follow-ups on file.  I,Kailey Littlejohn,acting as a sEducation administratorfor AHollice Espy MD.,have documented all relevant documentation on the behalf of AHollice Espy MD,as directed by  AHollice Espy MD while in the presence of AHollice Espy [place]  [place], [place] [address] [phone]

## 2020-10-17 ENCOUNTER — Ambulatory Visit: Payer: Medicare HMO | Admitting: Urology

## 2020-10-18 ENCOUNTER — Encounter: Payer: Self-pay | Admitting: Urology

## 2020-11-02 DIAGNOSIS — E119 Type 2 diabetes mellitus without complications: Secondary | ICD-10-CM | POA: Diagnosis not present

## 2020-11-02 DIAGNOSIS — Z0100 Encounter for examination of eyes and vision without abnormal findings: Secondary | ICD-10-CM | POA: Diagnosis not present

## 2020-11-02 LAB — HM DIABETES EYE EXAM

## 2020-11-13 NOTE — Progress Notes (Signed)
11/14/20 3:29 PM   Daniel Leyland Jr. [birth year] [id number]  Referring provider:  No referring provider defined for this encounter. Chief Complaint  Patient presents with   Prostate Cancer     HPI: Daniel Sobol. is a 65 y.o.male with a personal history of prostate cancer, bladder mass, and erectile dysfunction who presents today for CT results.   He had gleason 3+4 prostate cancer and is s/p radical prostatectomy in 2015.   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 Bilateral nephrolithiasis and left-sided obstructive/inflammatory changes related to passage of a 4 mm stone in bladder were also noted.  He had an unremarkable cystoscopy on 05/18/2019.   He was presented to Tumor board on 05/26/2019 with recommendation to pursue active surveillance.   08/31/2019 CT of pelvis revealed persistent soft tissue density 1.6 cm lesion in the midline anterior bladder wall with coarse peripheral calcification, unchanged since 04/03/2019 CT.   10/02/2020 CT of pelvis revealed a rounded, peripherally calcified lesion of the anterior bladder wall, measuring 1.6 cm. This is unchanged compared to prior examinations and almost certainly benign given stability.There were also no evidence of lymphadenopathy or metastatic disease in the pelvis.   He is doing well today with no new urinary symptoms. He reports he has not had a PCP recently which is why he has not his PSA checked.   He reports today that he has not been sexual intimate in 8 years due to his erectile dysfunction. He reports he was offered the injections but he was not comfortable with the needle. He is interested in medicine that will go through the head of his penis. He was interest in an implant but he reports he was told due to his diabetes that he could not get this implant.   PMH: Past Medical History:   Diagnosis Date   Arthritis of knee    Bladder outflow obstruction 04/28/2013   Coronary artery disease    Decreased libido 01/10/2013   Depression    DM (diabetes mellitus) ([place])    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/2013   Elevated prostate specific antigen (PSA) 02/21/2013   Hemorrhoid    History of kidney stones    Hyperlipidemia    Hypertension    Incomplete bladder emptying 01/10/2013   Malignant neoplasm of prostate ([place]) 04/28/2013   prostate removed   Myocardial infarction ([place]) 2019   2 stents placed   Obstructive sleep apnea on CPAP    does not use cpap, does not snore much since loosing 80 lbs    Surgical History: Past Surgical History:  Procedure Laterality Date   COLONOSCOPY     CORONARY STENT INTERVENTION N/A 08/20/2017   Procedure: CORONARY STENT INTERVENTION;  Surgeon: Yolonda Kida, MD;  Location: [facility];  Service: Cardiovascular;  Laterality: N/A;   dental implants     DG KNEE RIGHT COMPLETE ([place] HX) Right 05/23/2019   KNEE ARTHROPLASTY Left 05/23/2019   Procedure: COMPUTER ASSISTED TOTAL KNEE ARTHROPLASTY;  Surgeon: Dereck Leep, MD;  Location: [facility];  Service: Orthopedics;  Laterality: Left;   LEFT HEART CATH AND CORONARY ANGIOGRAPHY N/A 08/20/2017   Procedure: LEFT HEART CATH AND CORONARY ANGIOGRAPHY;  Surgeon: Teodoro Spray, MD;  Location: [facility];  Service: Cardiovascular;  Laterality: N/A;   PROSTATE SURGERY  07/14/2013   Prostatectomy    Home  Medications:  Allergies as of 11/14/2020   No Known Allergies      Medication List        Accurate as of November 14, 2020  3:29 PM. If you have any questions, ask your nurse or doctor.          alprostadil 250 MCG pellet Commonly known as: Muse 1 each (250 mcg total) by Transurethral route as needed for erectile dysfunction. use no more than 3 times per week Started by: Hollice Espy, MD   aspirin EC 81 MG tablet 81 mg daily. Swallow whole.    atorvastatin 80 MG tablet Commonly known as: LIPITOR Take 80 mg by mouth daily.   Cholecalciferol 125 MCG (5000 UT) capsule Take by mouth.   diphenhydramine-acetaminophen 25-500 MG Tabs tablet Commonly known as: TYLENOL PM Take 2 tablets by mouth at bedtime as needed.   metFORMIN 850 MG tablet Commonly known as: Glucophage Take 1 tablet (850 mg total) by mouth 2 (two) times daily with a meal.   metoprolol tartrate 25 MG tablet Commonly known as: LOPRESSOR Take 1 tablet (25 mg total) by mouth 2 (two) times daily.   senna 8.6 MG tablet Commonly known as: SENOKOT Take 2 tablets by mouth daily.        Allergies: No Known Allergies  Family History: Family History  Problem Relation Age of Onset   Pancreatic cancer Brother    Alcoholism Mother    Alcoholism Father    Prostate cancer Neg Hx    Kidney disease Neg Hx     Social History:  reports that he has never smoked. He has never used smokeless tobacco. He reports that he does not drink alcohol and does not use drugs.   Physical Exam: BP (!) 143/86   Pulse 76   Ht 6\' 1"  (1.854 m)   Wt 275 lb (124.7 kg)   BMI 36.28 kg/m   Constitutional:  Alert and oriented, No acute distress. HEENT: [place] AT, moist mucus membranes.  Trachea midline, no masses. Cardiovascular: No clubbing, cyanosis, or edema. Respiratory: Normal respiratory effort, no increased work of breathing. Skin: No rashes, bruises or suspicious lesions. Neurologic: Grossly intact, no focal deficits, moving all 4 extremities. Psychiatric: Normal mood and affect.  Laboratory Data:  Lab Results  Component Value Date   CREATININE 0.90 10/02/2020    Lab Results  Component Value Date   HGBA1C 8.1 (H) 03/30/2020   Pertinent Imaging: CT PELVIS WITH CONTRAST   TECHNIQUE: Multidetector CT imaging of the pelvis was performed using the standard protocol following the bolus administration of intravenous contrast.   CONTRAST:  172mL OMNIPAQUE IOHEXOL 350  MG/ML SOLN, additional oral enteric contrast   COMPARISON:  08/31/2019, 04/03/2019   FINDINGS: Urinary Tract: There is a rounded, peripherally calcified lesion of the anterior bladder wall, measuring 1.6 cm (series 5, image 93, series 2, image 32). No other abnormality visualized.   Bowel:  Unremarkable visualized pelvic bowel loops.   Vascular/Lymphatic: No pathologically enlarged lymph nodes. Aortic atherosclerosis.   Reproductive:  Status post prostatectomy.   Other:  None.   Musculoskeletal: No suspicious bone lesions identified.   IMPRESSION: 1. There is a rounded, peripherally calcified lesion of the anterior bladder wall, measuring 1.6 cm. This is unchanged compared to prior examinations and almost certainly benign given stability. 2. Status post prostatectomy. No evidence of lymphadenopathy or metastatic disease in the pelvis.   Aortic Atherosclerosis (ICD10-I70.0).     Electronically Signed   By: Dorna Bloom.D.  On: 10/03/2020 15:23  The above CT scan was personally reviewed.  Appears overall stable and likely postoperative changes from his radical prostatectomy.  Assessment & Plan:    Bladder mass  - Pelvis CT on 10/02/2020 is stable, strongly favors benign process as previously suspected  -At this point in time, given overall stability times multiple years, will defer further imaging -Patient was advised if he has any changes, including abdominal pain, drainage from his umbilicus, urinary symptoms change, unexplained weight loss or any other symptoms, he should return sooner for reevaluation  2.  History of prostate cancer - PSA; pending  - PSA; future( in a year with me), he has not had a PCP and is working on finding one that will be able to monitor his PSA routinely   3. ED following radical prostatectomy -We discussed the pathophysiology of erectile dysfunction today along with possible contributing factors. Discussed possible treatment options  including PDE 5 inhibitors, vacuum erectile device, intracavernosal injection, MUSE, and placement of the inflatable or malleable penile prosthesis for refractory cases.  - He was offered various options which he denied due to fear of needles, he has failed oral medications.  - Would like to try MUSE; prescription sent today; discussed how to administer this medication as well as my current knowledge there is been on backorder for this -Offered referral to discuss penile prosthesis placement, declined at this time but will let us know if he changes his mind  Return in 1 year with PSA.    I,Kailey Littlejohn,acting as a Education administrator for Hollice Espy, MD.,have documented all relevant documentation on the behalf of Hollice Espy, MD,as directed by  Hollice Espy, MD while in the presence of Hollice Espy, [place] [address] [place], [place] [address] [phone]

## 2020-11-14 ENCOUNTER — Other Ambulatory Visit: Payer: Self-pay

## 2020-11-14 ENCOUNTER — Ambulatory Visit: Payer: Medicare HMO | Admitting: Urology

## 2020-11-14 VITALS — BP 143/86 | HR 76 | Ht 73.0 in | Wt 275.0 lb

## 2020-11-14 DIAGNOSIS — Z8546 Personal history of malignant neoplasm of prostate: Secondary | ICD-10-CM

## 2020-11-14 MED ORDER — ALPROSTADIL (VASODILATOR) 250 MCG UR PLLT
250.0000 ug | PELLET | URETHRAL | 12 refills | Status: DC | PRN
Start: 2020-11-14 — End: 2020-12-17

## 2020-11-15 LAB — PSA: Prostate Specific Ag, Serum: <0.1 ng/mL (ref 0.0–4.0)

## 2020-11-16 ENCOUNTER — Telehealth: Payer: Self-pay | Admitting: *Deleted

## 2020-11-16 NOTE — Telephone Encounter (Signed)
Left patient a VM-no other alternate for MUSE-will notify once re-supplied

## 2020-11-16 NOTE — Telephone Encounter (Signed)
Received message from pharmacy-MUSE on backorder. Will notify the patient. Is there an alternate or wait for manufacturer to resupply?

## 2020-12-17 ENCOUNTER — Other Ambulatory Visit: Payer: Self-pay

## 2020-12-17 ENCOUNTER — Ambulatory Visit (INDEPENDENT_AMBULATORY_CARE_PROVIDER_SITE_OTHER): Payer: Medicare HMO | Admitting: Internal Medicine

## 2020-12-17 ENCOUNTER — Encounter: Payer: Self-pay | Admitting: Internal Medicine

## 2020-12-17 VITALS — BP 141/69 | HR 90 | Temp 98.2°F | Resp 18 | Ht 73.0 in | Wt 286.2 lb

## 2020-12-17 DIAGNOSIS — R310 Gross hematuria: Secondary | ICD-10-CM

## 2020-12-17 DIAGNOSIS — I25810 Atherosclerosis of coronary artery bypass graft(s) without angina pectoris: Secondary | ICD-10-CM | POA: Diagnosis not present

## 2020-12-17 DIAGNOSIS — Z23 Encounter for immunization: Secondary | ICD-10-CM

## 2020-12-17 DIAGNOSIS — Z6836 Body mass index (BMI) 36.0-36.9, adult: Secondary | ICD-10-CM | POA: Insufficient documentation

## 2020-12-17 DIAGNOSIS — Z0001 Encounter for general adult medical examination with abnormal findings: Secondary | ICD-10-CM

## 2020-12-17 DIAGNOSIS — N529 Male erectile dysfunction, unspecified: Secondary | ICD-10-CM | POA: Diagnosis not present

## 2020-12-17 DIAGNOSIS — F32A Depression, unspecified: Secondary | ICD-10-CM | POA: Insufficient documentation

## 2020-12-17 DIAGNOSIS — M159 Polyosteoarthritis, unspecified: Secondary | ICD-10-CM

## 2020-12-17 DIAGNOSIS — E6609 Other obesity due to excess calories: Secondary | ICD-10-CM | POA: Insufficient documentation

## 2020-12-17 DIAGNOSIS — I214 Non-ST elevation (NSTEMI) myocardial infarction: Secondary | ICD-10-CM

## 2020-12-17 DIAGNOSIS — I10 Essential (primary) hypertension: Secondary | ICD-10-CM

## 2020-12-17 DIAGNOSIS — G4733 Obstructive sleep apnea (adult) (pediatric): Secondary | ICD-10-CM

## 2020-12-17 DIAGNOSIS — E7849 Other hyperlipidemia: Secondary | ICD-10-CM | POA: Diagnosis not present

## 2020-12-17 DIAGNOSIS — Z6838 Body mass index (BMI) 38.0-38.9, adult: Secondary | ICD-10-CM | POA: Insufficient documentation

## 2020-12-17 DIAGNOSIS — I700 Atherosclerosis of aorta: Secondary | ICD-10-CM | POA: Diagnosis not present

## 2020-12-17 DIAGNOSIS — F325 Major depressive disorder, single episode, in full remission: Secondary | ICD-10-CM | POA: Diagnosis not present

## 2020-12-17 DIAGNOSIS — E1169 Type 2 diabetes mellitus with other specified complication: Secondary | ICD-10-CM

## 2020-12-17 DIAGNOSIS — E66812 Obesity, class 2: Secondary | ICD-10-CM | POA: Insufficient documentation

## 2020-12-17 DIAGNOSIS — Z8546 Personal history of malignant neoplasm of prostate: Secondary | ICD-10-CM

## 2020-12-17 DIAGNOSIS — Z6837 Body mass index (BMI) 37.0-37.9, adult: Secondary | ICD-10-CM

## 2020-12-17 NOTE — Assessment & Plan Note (Signed)
A1C and urine microalbumin today Encouraged him to consume a low carb diet and exercise for weight loss Will request copy of eye exam Encouraged routine foot exam Flu UTD per his report Prevnar today Encouraged him to get his covid booster

## 2020-12-17 NOTE — Assessment & Plan Note (Signed)
Encouraged diet and exercise for weight loss ?

## 2020-12-17 NOTE — Assessment & Plan Note (Signed)
Encouraged weight loss as this can help reduce sleep apnea symptoms Not wearing CPAP 

## 2020-12-17 NOTE — Assessment & Plan Note (Signed)
CMET and lipid profile today Encouraged him to consume a low fat diet Continue Atorvastatin, Metoprolol and ASA

## 2020-12-17 NOTE — Assessment & Plan Note (Signed)
CMET and lipid profile today °Encouraged him to consume a low fat diet °Continue Atorvastatin °

## 2020-12-17 NOTE — Progress Notes (Signed)
HPI  Patient presents to clinic today to establish care and for management of the conditions listed below.  OA: Mainly in his right knee, wrist and back. He has had a left TKR. He takes Tylenol as needed with good relief of symptoms.  He does not follow-up with orthopedics.  HLD with CAD status post MI with Stents: His last LDL was 83, triglycerides 68, 2019.  He denies myalgias on Atorvastatin.  He is taking Metoprolol and Aspirin as well.  He does not consume a low-fat diet.  Depression: Currently not an issue.  He is not taking any medications for this.  He denies anxiety, SI/HI.  DM2: His last A1c was 8.1%, 03/2020.  He is taking Metformin as prescribed.  He sugars range 100-112.  His last eye exam was 10/2020, [facility].  He does not check his feet routinely.  Flu 12/2019.  Pneumovax never.  Prevnar never.  COVID never.  History of Prostate Cancer with subsequent ED: Status post prostatectomy.  He is taking Aprostadil as prescribed.  He follows with urology.  HTN: His BP today is 141/69.  He is taking Metoprolol as prescribed.  ECG from 03/2020 reviewed.  OSA: He averages 2 hours of sleep per night without the use of his CPAP.  Sleep study from 09/2017 reviewed.  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   Elevated prostate specific antigen (PSA) 02/21/2013   Hemorrhoid    History of kidney stones    Hyperlipidemia    Hypertension    Incomplete bladder emptying 01/10/2013   Malignant neoplasm of prostate ([place]) 04/28/2013   prostate removed   Myocardial infarction [facility]) 2019   2 stents placed   Obstructive sleep apnea on CPAP    does not use cpap, does not snore much since loosing 80 lbs    Current Outpatient Medications  Medication Sig Dispense Refill    alprostadil (MUSE) 250 MCG pellet 1 each (250 mcg total) by Transurethral route as needed for erectile dysfunction. use no more than 3 times per week 6 each 12   aspirin EC 81 MG tablet 81 mg daily. Swallow whole.     atorvastatin (LIPITOR) 80 MG tablet Take 80 mg by mouth daily.     Cholecalciferol 125 MCG (5000 UT) capsule Take by mouth.     diphenhydramine-acetaminophen (TYLENOL PM) 25-500 MG TABS tablet Take 2 tablets by mouth at bedtime as needed.     metFORMIN (GLUCOPHAGE) 850 MG tablet Take 1 tablet (850 mg total) by mouth 2 (two) times daily with a meal. 180 tablet 2   metoprolol tartrate (LOPRESSOR) 25 MG tablet Take 1 tablet (25 mg total) by mouth 2 (two) times daily. 60 tablet 2   senna (SENOKOT) 8.6 MG tablet Take 2 tablets by mouth daily.     No current facility-administered medications for this visit.    No Known Allergies  Family History  Problem Relation Age of Onset   Pancreatic cancer Brother    Alcoholism Mother    Alcoholism Father    Prostate cancer Neg Hx    Kidney disease Neg Hx     Social History   Socioeconomic History   Marital status: Single    Spouse name: Not on file   Number  of children: Not on file   Years of education: Not on file   Highest education level: Not on file  Occupational History   Occupation: [employer]    Employer: [employer]    Comment: retired  Tobacco Use   Smoking status: Never   Smokeless tobacco: Never  Vaping Use   Vaping Use: Never used  Substance and Sexual Activity   Alcohol use: No    Alcohol/week: 0.0 standard drinks    Comment: rare   Drug use: No   Sexual activity: Not Currently  Other Topics Concern   Not on file  Social History Narrative   Lives with girlfriend, April   Social Determinants of Health   Financial Resource Strain: Not on file  Food Insecurity: Not on file  Transportation Needs: Not on file  Physical Activity: Not on file  Stress: Not on file  Social Connections: Not on  file  Intimate Partner Violence: Not on file    ROS:  Constitutional: Denies fever, malaise, fatigue, headache or abrupt weight changes.  HEENT: Denies eye pain, eye redness, ear pain, ringing in the ears, wax buildup, runny nose, nasal congestion, bloody nose, or sore throat. Respiratory: Denies difficulty breathing, shortness of breath, cough or sputum production.   Cardiovascular: Denies chest pain, chest tightness, palpitations or swelling in the hands or feet.  Gastrointestinal: Denies abdominal pain, bloating, constipation, diarrhea or blood in the stool.  GU: Patient reports erectile dysfunction.  Denies frequency, urgency, pain with urination, blood in urine, odor or discharge. Musculoskeletal: Patient reports joint pain.  Denies decrease in range of motion, difficulty with gait, muscle pain or joint swelling.  Skin: Denies redness, rashes, lesions or ulcercations.  Neurological: Denies dizziness, difficulty with memory, difficulty with speech or problems with balance and coordination.  Psych: Denies anxiety, depression, SI/HI.  No other specific complaints in a complete review of systems (except as listed in HPI above).  PE:  BP (!) 141/69 (BP Location: Right Arm, Patient Position: Sitting, Cuff Size: Large)   Pulse 90   Temp 98.2 F (36.8 C) (Temporal)   Resp 18   Ht 6\' 1"  (1.854 m)   Wt 286 lb 3.2 oz (129.8 kg)   SpO2 98%   BMI 37.76 kg/m   Wt Readings from Last 3 Encounters:  11/14/20 275 lb (124.7 kg)  04/05/20 286 lb (129.7 kg)  01/23/20 286 lb (129.7 kg)    General: Appears his stated age, obese, in NAD. HEENT: Head: normal shape and size; Eyes: EOMs intact;  Neck: Neck supple, trachea midline. No masses, lumps or thyromegaly present.  Cardiovascular: Normal rate and rhythm. S1,S2 noted.  No murmur, rubs or gallops noted. No JVD or BLE edema. No carotid bruits noted. Pulmonary/Chest: Normal effort and positive vesicular breath sounds. No respiratory distress.  No wheezes, rales or ronchi noted.  Abdomen: Soft and nontender. Normal bowel sounds, no bruits noted. No distention or masses noted. Liver, spleen and kidneys non palpable. Musculoskeletal:  No difficulty with gait.  Neurological: Alert and oriented.  Psychiatric: Mood and affect normal. Behavior is normal. Judgment and thought content normal.   BMET    Component Value Date/Time   NA 139 03/30/2020 1015   NA 139 04/21/2012 0641   K 4.4 03/30/2020 1015   K 3.8 04/21/2012 0641   CL 104 03/30/2020 1015   CL 107 04/21/2012 0641   CO2 23 05/17/2019 0817   CO2 27 04/21/2012 0641   GLUCOSE 149 (H) 03/30/2020 1015  GLUCOSE 160 (H) 05/17/2019 0817   GLUCOSE 212 (H) 04/21/2012 0641   BUN 14 03/30/2020 1015   BUN 22 (H) 04/21/2012 0641   CREATININE 0.90 10/02/2020 1406   CREATININE 1.51 (H) 04/21/2012 0641   CALCIUM 9.7 03/30/2020 1015   CALCIUM 8.7 04/21/2012 0641   GFRNONAA 86 03/30/2020 1015   GFRNONAA 51 (L) 04/21/2012 0641   GFRAA 100 03/30/2020 1015   GFRAA 59 (L) 04/21/2012 0641    Lipid Panel     Component Value Date/Time   CHOL 128 03/30/2020 1015   TRIG 60 03/30/2020 1015   HDL 41 03/30/2020 1015   CHOLHDL 3.1 03/30/2020 1015   CHOLHDL 2.8 08/20/2017 0359   VLDL 14 08/20/2017 0359   LDLCALC 74 03/30/2020 1015    CBC    Component Value Date/Time   WBC 6.4 03/30/2020 1015   WBC 5.6 05/17/2019 0817   RBC 5.39 03/30/2020 1015   RBC 5.02 05/17/2019 0817   HGB 14.0 03/30/2020 1015   HCT 42.5 03/30/2020 1015   PLT 306 03/30/2020 1015   MCV 79 03/30/2020 1015   MCV 80 04/21/2012 0641   MCH 26.0 (L) 03/30/2020 1015   MCH 25.7 (L) 05/17/2019 0817   MCHC 32.9 03/30/2020 1015   MCHC 32.3 05/17/2019 0817   RDW 14.6 03/30/2020 1015   RDW 14.9 (H) 04/21/2012 0641   LYMPHSABS 1.8 03/30/2020 1015   MONOABS 0.5 04/03/2019 0757   EOSABS 0.3 03/30/2020 1015   BASOSABS 0.1 03/30/2020 1015    Hgb A1C Lab Results  Component Value Date   HGBA1C 8.1 (H) 03/30/2020      Assessment and Plan:   Webb Silversmith, NP This visit occurred during the SARS-CoV-2 public health emergency.  Safety protocols were in place, including screening questions prior to the visit, additional usage of staff PPE, and extensive cleaning of exam room while observing appropriate contact time as indicated for disinfecting solutions.

## 2020-12-17 NOTE — Assessment & Plan Note (Signed)
Controlled on Metoprolol Reinforced DASH diet and exercise for weight loss CMET today

## 2020-12-17 NOTE — Assessment & Plan Note (Signed)
Continue Alprostadil per urology

## 2020-12-17 NOTE — Assessment & Plan Note (Signed)
In remission s/p prostatectomy He will continue to follow with urology

## 2020-12-17 NOTE — Assessment & Plan Note (Signed)
Currently not an issue Suppoprt offered

## 2020-12-17 NOTE — Addendum Note (Signed)
Addended by: Wilson Singer on: 12/17/2020 11:11 AM   Modules accepted: Orders

## 2020-12-17 NOTE — Addendum Note (Signed)
Addended by: [name] on: 12/17/2020 12:01 PM   Modules accepted: Orders

## 2020-12-17 NOTE — Assessment & Plan Note (Signed)
Encouraged regular physical activity Encouraged weight loss as this can help reduce joint pain Continue Tylenol OTC as needed

## 2020-12-17 NOTE — Patient Instructions (Signed)

## 2020-12-18 ENCOUNTER — Telehealth: Payer: Self-pay | Admitting: Internal Medicine

## 2020-12-18 LAB — LIPID PANEL
Cholesterol: 178 mg/dL (ref <200)
HDL: 52 mg/dL (ref >=40)
LDL Cholesterol (Calc): 106 mg/dL (calc) — ABNORMAL HIGH
Non-HDL Cholesterol (Calc): 126 mg/dL (calc) (ref <130)
Total CHOL/HDL Ratio: 3.4 (calc) (ref <5.0)
Triglycerides: 103 mg/dL (ref <150)

## 2020-12-18 LAB — URINALYSIS, COMPLETE
Bilirubin Urine: NEGATIVE
Glucose, UA: NEGATIVE
Hgb urine dipstick: NEGATIVE
Hyaline Cast: NONE SEEN /LPF
Ketones, ur: NEGATIVE
Nitrite: POSITIVE — AB
RBC / HPF: NONE SEEN /HPF (ref 0–2)
Specific Gravity, Urine: 1.03 (ref 1.001–1.035)
WBC, UA: NONE SEEN /HPF (ref 0–5)
pH: 6 (ref 5.0–8.0)

## 2020-12-18 LAB — CBC
HCT: 43.9 % (ref 38.5–50.0)
Hemoglobin: 13.8 g/dL (ref 13.2–17.1)
MCH: 25.6 pg — ABNORMAL LOW (ref 27.0–33.0)
MCHC: 31.4 g/dL — ABNORMAL LOW (ref 32.0–36.0)
MCV: 81.4 fL (ref 80.0–100.0)
MPV: 9.9 fL (ref 7.5–12.5)
Platelets: 305 10*3/uL (ref 140–400)
RBC: 5.39 10*6/uL (ref 4.20–5.80)
RDW: 14.7 % (ref 11.0–15.0)
WBC: 5.5 10*3/uL (ref 3.8–10.8)

## 2020-12-18 LAB — COMPLETE METABOLIC PANEL WITH GFR
AG Ratio: 1.4 (calc) (ref 1.0–2.5)
ALT: 24 U/L (ref 9–46)
AST: 19 U/L (ref 10–35)
Albumin: 4.3 g/dL (ref 3.6–5.1)
Alkaline phosphatase (APISO): 103 U/L (ref 35–144)
BUN: 17 mg/dL (ref 7–25)
CO2: 25 mmol/L (ref 20–32)
Calcium: 9.7 mg/dL (ref 8.6–10.3)
Chloride: 105 mmol/L (ref 98–110)
Creat: 0.92 mg/dL (ref 0.70–1.35)
Globulin: 3 g/dL (calc) (ref 1.9–3.7)
Glucose, Bld: 180 mg/dL — ABNORMAL HIGH (ref 65–139)
Potassium: 4.3 mmol/L (ref 3.5–5.3)
Sodium: 139 mmol/L (ref 135–146)
Total Bilirubin: 0.8 mg/dL (ref 0.2–1.2)
Total Protein: 7.3 g/dL (ref 6.1–8.1)
eGFR: 92 mL/min/{1.73_m2} (ref >=60)

## 2020-12-18 LAB — HEMOGLOBIN A1C
Hgb A1c MFr Bld: 8.2 % of total Hgb — ABNORMAL HIGH (ref <5.7)
Mean Plasma Glucose: 189 mg/dL
eAG (mmol/L): 10.4 mmol/L

## 2020-12-18 LAB — MICROALBUMIN / CREATININE URINE RATIO
Creatinine, Urine: 265 mg/dL (ref 20–320)
Microalb Creat Ratio: 7 mcg/mg creat (ref <30)
Microalb, Ur: 1.8 mg/dL

## 2020-12-18 LAB — URINE CULTURE
MICRO NUMBER:: 12603037
SPECIMEN QUALITY:: ADEQUATE

## 2020-12-18 NOTE — Telephone Encounter (Signed)
Patient returning call for lab results. Please call back

## 2020-12-18 NOTE — Telephone Encounter (Signed)
Documented in result notes. 

## 2020-12-19 MED ORDER — GLIPIZIDE 5 MG PO TABS: 5.0000 mg | ORAL_TABLET | Freq: Two times a day (BID) | ORAL | 2 refills | Status: DC

## 2020-12-19 NOTE — Addendum Note (Signed)
Addended by: [name] on: 12/19/2020 07:49 AM   Modules accepted: Orders

## 2021-01-10 ENCOUNTER — Emergency Department
Admission: EM | Admit: 2021-01-10 | Discharge: 2021-01-10 | Disposition: A | Discharge status: Home / Self Care | Payer: Medicare HMO | Attending: Student in an Organized Health Care Education/Training Program | Admitting: Student in an Organized Health Care Education/Training Program

## 2021-01-10 ENCOUNTER — Encounter: Payer: Self-pay | Admitting: Emergency Medicine

## 2021-01-10 ENCOUNTER — Emergency Department: Payer: Medicare HMO

## 2021-01-10 ENCOUNTER — Other Ambulatory Visit: Payer: Self-pay

## 2021-01-10 DIAGNOSIS — M47812 Spondylosis without myelopathy or radiculopathy, cervical region: Secondary | ICD-10-CM | POA: Diagnosis not present

## 2021-01-10 DIAGNOSIS — M2578 Osteophyte, vertebrae: Secondary | ICD-10-CM | POA: Diagnosis not present

## 2021-01-10 DIAGNOSIS — I2510 Atherosclerotic heart disease of native coronary artery without angina pectoris: Secondary | ICD-10-CM | POA: Diagnosis not present

## 2021-01-10 DIAGNOSIS — Z7984 Long term (current) use of oral hypoglycemic drugs: Secondary | ICD-10-CM | POA: Insufficient documentation

## 2021-01-10 DIAGNOSIS — S199XXA Unspecified injury of neck, initial encounter: Secondary | ICD-10-CM | POA: Diagnosis present

## 2021-01-10 DIAGNOSIS — S161XXA Strain of muscle, fascia and tendon at neck level, initial encounter: Secondary | ICD-10-CM | POA: Insufficient documentation

## 2021-01-10 DIAGNOSIS — E1169 Type 2 diabetes mellitus with other specified complication: Secondary | ICD-10-CM | POA: Diagnosis not present

## 2021-01-10 DIAGNOSIS — Z79899 Other long term (current) drug therapy: Secondary | ICD-10-CM | POA: Diagnosis not present

## 2021-01-10 DIAGNOSIS — Y92410 Unspecified street and highway as the place of occurrence of the external cause: Secondary | ICD-10-CM | POA: Insufficient documentation

## 2021-01-10 DIAGNOSIS — M545 Low back pain, unspecified: Secondary | ICD-10-CM | POA: Diagnosis not present

## 2021-01-10 DIAGNOSIS — M5459 Other low back pain: Secondary | ICD-10-CM | POA: Diagnosis not present

## 2021-01-10 DIAGNOSIS — Z8546 Personal history of malignant neoplasm of prostate: Secondary | ICD-10-CM | POA: Insufficient documentation

## 2021-01-10 DIAGNOSIS — I10 Essential (primary) hypertension: Secondary | ICD-10-CM | POA: Diagnosis not present

## 2021-01-10 DIAGNOSIS — Z7982 Long term (current) use of aspirin: Secondary | ICD-10-CM | POA: Diagnosis not present

## 2021-01-10 DIAGNOSIS — E785 Hyperlipidemia, unspecified: Secondary | ICD-10-CM | POA: Diagnosis not present
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
## 2021-03-19 ENCOUNTER — Encounter: Payer: Self-pay | Admitting: Internal Medicine

## 2021-03-19 ENCOUNTER — Ambulatory Visit (INDEPENDENT_AMBULATORY_CARE_PROVIDER_SITE_OTHER): Payer: Medicare HMO | Admitting: Internal Medicine

## 2021-03-19 ENCOUNTER — Other Ambulatory Visit: Payer: Self-pay

## 2021-03-19 VITALS — BP 140/68 | HR 73 | Temp 97.5°F | Wt 294.0 lb

## 2021-03-19 DIAGNOSIS — I10 Essential (primary) hypertension: Secondary | ICD-10-CM | POA: Diagnosis not present

## 2021-03-19 DIAGNOSIS — I700 Atherosclerosis of aorta: Secondary | ICD-10-CM

## 2021-03-19 DIAGNOSIS — E7849 Other hyperlipidemia: Secondary | ICD-10-CM

## 2021-03-19 DIAGNOSIS — I214 Non-ST elevation (NSTEMI) myocardial infarction: Secondary | ICD-10-CM | POA: Diagnosis not present

## 2021-03-19 DIAGNOSIS — E1169 Type 2 diabetes mellitus with other specified complication: Secondary | ICD-10-CM

## 2021-03-19 DIAGNOSIS — G8929 Other chronic pain: Secondary | ICD-10-CM

## 2021-03-19 DIAGNOSIS — G4733 Obstructive sleep apnea (adult) (pediatric): Secondary | ICD-10-CM

## 2021-03-19 DIAGNOSIS — M545 Low back pain, unspecified: Secondary | ICD-10-CM

## 2021-03-19 DIAGNOSIS — Z6838 Body mass index (BMI) 38.0-38.9, adult: Secondary | ICD-10-CM

## 2021-03-19 DIAGNOSIS — I25810 Atherosclerosis of coronary artery bypass graft(s) without angina pectoris: Secondary | ICD-10-CM | POA: Diagnosis not present

## 2021-03-19 LAB — COMPLETE METABOLIC PANEL WITH GFR
AG Ratio: 1.3 (calc) (ref 1.0–2.5)
ALT: 26 U/L (ref 9–46)
AST: 17 U/L (ref 10–35)
Albumin: 4.4 g/dL (ref 3.6–5.1)
Alkaline phosphatase (APISO): 131 U/L (ref 35–144)
BUN: 21 mg/dL (ref 7–25)
CO2: 26 mmol/L (ref 20–32)
Calcium: 10.1 mg/dL (ref 8.6–10.3)
Chloride: 101 mmol/L (ref 98–110)
Creat: 0.93 mg/dL (ref 0.70–1.35)
Globulin: 3.4 g/dL (calc) (ref 1.9–3.7)
Glucose, Bld: 235 mg/dL — ABNORMAL HIGH (ref 65–99)
Potassium: 4.8 mmol/L (ref 3.5–5.3)
Sodium: 137 mmol/L (ref 135–146)
Total Bilirubin: 0.8 mg/dL (ref 0.2–1.2)
Total Protein: 7.8 g/dL (ref 6.1–8.1)
eGFR: 91 mL/min/{1.73_m2} (ref >=60)

## 2021-03-19 LAB — LIPID PANEL
Cholesterol: 191 mg/dL (ref <200)
HDL: 53 mg/dL (ref >=40)
LDL Cholesterol (Calc): 118 mg/dL (calc) — ABNORMAL HIGH
Non-HDL Cholesterol (Calc): 138 mg/dL (calc) — ABNORMAL HIGH (ref <130)
Total CHOL/HDL Ratio: 3.6 (calc) (ref <5.0)
Triglycerides: 98 mg/dL (ref <150)

## 2021-03-19 LAB — POCT GLYCOSYLATED HEMOGLOBIN (HGB A1C): Hemoglobin A1C: 9.8 % — AB (ref 4.0–5.6)

## 2021-03-19 MED ORDER — GLIPIZIDE 10 MG PO TABS: 10.0000 mg | ORAL_TABLET | Freq: Two times a day (BID) | ORAL | 0 refills | Status: DC

## 2021-03-19 MED ORDER — METHOCARBAMOL 500 MG PO TABS: 500.0000 mg | ORAL_TABLET | Freq: Every day | ORAL | 1 refills | Status: DC | PRN
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
## 2021-03-19 NOTE — Patient Instructions (Signed)

## 2021-03-19 NOTE — Assessment & Plan Note (Signed)
Controlled on Metoprolol Reinforced DASH diet and exercise for weight loss C-Met today

## 2021-03-19 NOTE — Assessment & Plan Note (Signed)
Encourage diet and exercise for weight loss He is not interested in injectable medication for weight loss at this time

## 2021-03-19 NOTE — Assessment & Plan Note (Signed)
C-Met and lipid profile today Encouraged him to consume a low-fat diet Continue Atorvastatin, Metoprolol and Aspirin

## 2021-03-19 NOTE — Assessment & Plan Note (Signed)
Encourage stretching Encourage weight loss and core strengthening as this can help reduce back pain Rx for Methocarbamol 500 mg daily as needed-sedation caution given

## 2021-03-19 NOTE — Assessment & Plan Note (Signed)
Encourage weight loss as this can help reduce sleep apnea symptoms He is unable to tolerate CPAP

## 2021-03-19 NOTE — Assessment & Plan Note (Signed)
POCT A1c 9.8% Urine microalbumin done 12/2020 Encouraged him to consume a low-carb diet and exercise for weight loss Continue Metformin We will increase Glipizide to 10 mg 2 times daily He is not interested in injectable medication for weight loss at this time Encourage yearly eye exams Encourage routine foot exams Flu, Prevnar UTD We will get Pneumovax 12/2021 Encouraged him to get his COVID booster

## 2021-03-19 NOTE — Assessment & Plan Note (Signed)
C-Met and lipid profile today ?Encouraged him to consume a low-fat diet ?Continue Atorvastatin ?

## 2021-03-21 ENCOUNTER — Other Ambulatory Visit: Payer: Self-pay

## 2021-03-21 MED ORDER — ROSUVASTATIN CALCIUM 10 MG PO TABS: 10.0000 mg | ORAL_TABLET | Freq: Every day | ORAL | 0 refills | Status: DC

## 2021-03-21 NOTE — Telephone Encounter (Signed)
-----   Message from [name], NP sent at 03/20/2021  5:32 PM EST ----- Glucose is elevated because A1c is elevated.  Liver and kidney function is normal.  Cholesterol remains slightly elevated despite atorvastatin.  Would he be agreeable to switch to rosuvastatin 10 mg daily?

## 2021-03-21 NOTE — Telephone Encounter (Signed)
Pt advised.  Please send rosuvastatin 10mg  to [facility] in [place].   Thanks,   -Mickel Baas

## 2021-03-25 ENCOUNTER — Other Ambulatory Visit: Payer: Self-pay | Admitting: Internal Medicine

## 2021-03-26 NOTE — Telephone Encounter (Signed)
Refusing med due to dose inconsistent with current med list, please assess.  Requested Prescriptions  Pending Prescriptions Disp Refills   glipiZIDE (GLUCOTROL) 5 MG tablet [Pharmacy Med Name: GLIPIZIDE 5MG  TABLETS] 60 tablet 2    Sig: TAKE 1 TABLET(5 MG) BY MOUTH TWICE DAILY BEFORE A MEAL     Endocrinology:  Diabetes - Sulfonylureas Failed - 03/25/2021 10:21 AM      Failed - HBA1C is between 0 and 7.9 and within 180 days    Hemoglobin A1C  Date Value Ref Range Status  03/19/2021 9.8 (A) 4.0 - 5.6 % Final   Hgb A1c MFr Bld  Date Value Ref Range Status  12/17/2020 8.2 (H) <5.7 % of total Hgb Final    Comment:    For someone without known diabetes, a hemoglobin A1c value of 6.5% or greater indicates that they may have  diabetes and this should be confirmed with a follow-up  test. . For someone with known diabetes, a value <7% indicates  that their diabetes is well controlled and a value  greater than or equal to 7% indicates suboptimal  control. A1c targets should be individualized based on  duration of diabetes, age, comorbid conditions, and  other considerations. . Currently, no consensus exists regarding use of hemoglobin A1c for diagnosis of diabetes for children. .          Passed - Cr in normal range and within 360 days    Creat  Date Value Ref Range Status  03/19/2021 0.93 0.70 - 1.35 mg/dL Final   Creatinine, Urine  Date Value Ref Range Status  12/17/2020 265 20 - 320 mg/dL Final         Passed - Valid encounter within last 6 months    Recent Outpatient Visits          1 week ago Type 2 diabetes mellitus with other specified complication, without long-term current use of insulin [facility])   [place], NP   3 months ago Encounter for general adult medical examination with abnormal findings   [facility], Coralie Keens, NP      Future Appointments            In 7 months Hollice Espy, MD [place]

## 2021-03-26 NOTE — Telephone Encounter (Signed)
Refusing med due to dose inconsistent with current med list, please assess.  Requested Prescriptions  Pending Prescriptions Disp Refills   glipiZIDE (GLUCOTROL) 5 MG tablet [Pharmacy Med Name: GLIPIZIDE 5MG  TABLETS] 60 tablet 2    Sig: TAKE 1 TABLET(5 MG) BY MOUTH TWICE DAILY BEFORE A MEAL     Endocrinology:  Diabetes - Sulfonylureas Failed - 03/25/2021 10:21 AM      Failed - HBA1C is between 0 and 7.9 and within 180 days    Hemoglobin A1C  Date Value Ref Range Status  03/19/2021 9.8 (A) 4.0 - 5.6 % Final   Hgb A1c MFr Bld  Date Value Ref Range Status  12/17/2020 8.2 (H) <5.7 % of total Hgb Final    Comment:    For someone without known diabetes, a hemoglobin A1c value of 6.5% or greater indicates that they may have  diabetes and this should be confirmed with a follow-up  test. . For someone with known diabetes, a value <7% indicates  that their diabetes is well controlled and a value  greater than or equal to 7% indicates suboptimal  control. A1c targets should be individualized based on  duration of diabetes, age, comorbid conditions, and  other considerations. . Currently, no consensus exists regarding use of hemoglobin A1c for diagnosis of diabetes for children. .           Passed - Cr in normal range and within 360 days    Creat  Date Value Ref Range Status  03/19/2021 0.93 0.70 - 1.35 mg/dL Final   Creatinine, Urine  Date Value Ref Range Status  12/17/2020 265 20 - 320 mg/dL Final          Passed - Valid encounter within last 6 months    Recent Outpatient Visits           1 week ago Type 2 diabetes mellitus with other specified complication, without long-term current use of insulin [facility])   [place], NP   3 months ago Encounter for general adult medical examination with abnormal findings   [facility], Coralie Keens, NP       Future Appointments             In 7 months Hollice Espy, MD [place]

## 2021-06-12 ENCOUNTER — Other Ambulatory Visit: Payer: Self-pay | Admitting: Internal Medicine

## 2021-06-12 NOTE — Telephone Encounter (Signed)
Requested Prescriptions  ?Pending Prescriptions Disp Refills  ?? glipiZIDE (GLUCOTROL) 10 MG tablet [Pharmacy Med Name: GLIPIZIDE '10MG'$  TABLETS] 180 tablet 0  ?  Sig: TAKE 1 TABLET(10 MG) BY MOUTH TWICE DAILY BEFORE A MEAL  ?  ? Endocrinology:  Diabetes - Sulfonylureas Failed - 06/12/2021 10:55 AM  ?  ?  Failed - HBA1C is between 0 and 7.9 and within 180 days  ?  Hemoglobin A1C  ?Date Value Ref Range Status  ?03/19/2021 9.8 (A) 4.0 - 5.6 % Final  ? ?Hgb A1c MFr Bld  ?Date Value Ref Range Status  ?12/17/2020 8.2 (H) <5.7 % of total Hgb Final  ?  Comment:  ?  For someone without known diabetes, a hemoglobin A1c ?value of 6.5% or greater indicates that they may have  ?diabetes and this should be confirmed with a follow-up  ?test. ?. ?For someone with known diabetes, a value <7% indicates  ?that their diabetes is well controlled and a value  ?greater than or equal to 7% indicates suboptimal  ?control. A1c targets should be individualized based on  ?duration of diabetes, age, comorbid conditions, and  ?other considerations. ?. ?Currently, no consensus exists regarding use of ?hemoglobin A1c for diagnosis of diabetes for children. ?. ?  ?   ?  ?  Passed - Cr in normal range and within 360 days  ?  Creat  ?Date Value Ref Range Status  ?03/19/2021 0.93 0.70 - 1.35 mg/dL Final  ? ?Creatinine, Urine  ?Date Value Ref Range Status  ?12/17/2020 265 20 - 320 mg/dL Final  ?   ?  ?  Passed - Valid encounter within last 6 months  ?  Recent Outpatient Visits   ?      ? 2 months ago Type 2 diabetes mellitus with other specified complication, without long-term current use of insulin ([place])  ? [facility] [place], Coralie Keens, NP  ? 5 months ago Encounter for general adult medical examination with abnormal findings  ? [facility] [place], Coralie Keens, NP  ?  ?  ?Future Appointments   ?        ? In 5 months Hollice Espy, MD [place]  ?  ? ?  ?  ?  ? ?

## 2021-06-19 ENCOUNTER — Other Ambulatory Visit: Payer: Self-pay | Admitting: Internal Medicine

## 2021-06-19 NOTE — Telephone Encounter (Signed)
Requested Prescriptions  ?Pending Prescriptions Disp Refills  ?? rosuvastatin (CRESTOR) 10 MG tablet [Pharmacy Med Name: ROSUVASTATIN '10MG'$  TABLETS] 90 tablet 0  ?  Sig: TAKE 1 TABLET(10 MG) BY MOUTH DAILY  ?  ? Cardiovascular:  Antilipid - Statins 2 Failed - 06/19/2021 11:54 AM  ?  ?  Failed - Lipid Panel in normal range within the last 12 months  ?  Cholesterol, Total  ?Date Value Ref Range Status  ?03/30/2020 128 100 - 199 mg/dL Final  ? ?Cholesterol  ?Date Value Ref Range Status  ?03/19/2021 191 <200 mg/dL Final  ? ?LDL Cholesterol (Calc)  ?Date Value Ref Range Status  ?03/19/2021 118 (H) mg/dL (calc) Final  ?  Comment:  ?  Reference range: <100 ?Marland Kitchen ?Desirable range <100 mg/dL for primary prevention;   ?<70 mg/dL for patients with CHD or diabetic patients  ?with > or = 2 CHD risk factors. ?. ?LDL-C is now calculated using the Martin-Hopkins  ?calculation, which is a validated novel method providing  ?better accuracy than the Friedewald equation in the  ?estimation of LDL-C.  ?Cresenciano Genre et al. Annamaria Helling. 8938;101(75): 2061-2068  ?(http://education.[employer].com/faq/FAQ164) ?  ? ?HDL  ?Date Value Ref Range Status  ?03/19/2021 53 > OR = 40 mg/dL Final  ?03/30/2020 41 >39 mg/dL Final  ? ?Triglycerides  ?Date Value Ref Range Status  ?03/19/2021 98 <150 mg/dL Final  ? ?  ?  ?  Passed - Cr in normal range and within 360 days  ?  Creat  ?Date Value Ref Range Status  ?03/19/2021 0.93 0.70 - 1.35 mg/dL Final  ? ?Creatinine, Urine  ?Date Value Ref Range Status  ?12/17/2020 265 20 - 320 mg/dL Final  ?   ?  ?  Passed - Patient is not pregnant  ?  ?  Passed - Valid encounter within last 12 months  ?  Recent Outpatient Visits   ?      ? 3 months ago Type 2 diabetes mellitus with other specified complication, without long-term current use of insulin ([place])  ? [facility] [place], Coralie Keens, NP  ? 6 months ago Encounter for general adult medical examination with abnormal findings  ? [facility] [place],  Coralie Keens, NP  ?  ?  ?Future Appointments   ?        ? Tomorrow [name], NP [facility] [place]  ? In 5 months Hollice Espy, MD [place]  ?  ? ?  ?  ?  ? ? ?

## 2021-06-20 ENCOUNTER — Encounter: Payer: Self-pay | Admitting: Internal Medicine

## 2021-06-20 ENCOUNTER — Ambulatory Visit (INDEPENDENT_AMBULATORY_CARE_PROVIDER_SITE_OTHER): Payer: Medicare HMO | Admitting: Internal Medicine

## 2021-06-20 VITALS — BP 136/77 | HR 81 | Temp 97.7°F | Wt 294.0 lb

## 2021-06-20 DIAGNOSIS — M545 Low back pain, unspecified: Secondary | ICD-10-CM | POA: Diagnosis not present

## 2021-06-20 DIAGNOSIS — I25810 Atherosclerosis of coronary artery bypass graft(s) without angina pectoris: Secondary | ICD-10-CM | POA: Diagnosis not present

## 2021-06-20 DIAGNOSIS — Z1211 Encounter for screening for malignant neoplasm of colon: Secondary | ICD-10-CM

## 2021-06-20 DIAGNOSIS — E66812 Obesity, class 2: Secondary | ICD-10-CM

## 2021-06-20 DIAGNOSIS — I10 Essential (primary) hypertension: Secondary | ICD-10-CM

## 2021-06-20 DIAGNOSIS — G4733 Obstructive sleep apnea (adult) (pediatric): Secondary | ICD-10-CM | POA: Diagnosis not present

## 2021-06-20 DIAGNOSIS — E7849 Other hyperlipidemia: Secondary | ICD-10-CM

## 2021-06-20 DIAGNOSIS — I700 Atherosclerosis of aorta: Secondary | ICD-10-CM | POA: Diagnosis not present

## 2021-06-20 DIAGNOSIS — E1169 Type 2 diabetes mellitus with other specified complication: Secondary | ICD-10-CM | POA: Diagnosis not present

## 2021-06-20 DIAGNOSIS — I214 Non-ST elevation (NSTEMI) myocardial infarction: Secondary | ICD-10-CM | POA: Diagnosis not present

## 2021-06-20 DIAGNOSIS — G8929 Other chronic pain: Secondary | ICD-10-CM

## 2021-06-20 DIAGNOSIS — Z6838 Body mass index (BMI) 38.0-38.9, adult: Secondary | ICD-10-CM

## 2021-06-20 LAB — POCT GLYCOSYLATED HEMOGLOBIN (HGB A1C): Hemoglobin A1C: 10.4 % — AB (ref 4.0–5.6)

## 2021-06-20 MED ORDER — RYBELSUS 3 MG PO TABS: 3.0000 mg | ORAL_TABLET | Freq: Every day | ORAL | 0 refills | Status: DC

## 2021-06-20 NOTE — Assessment & Plan Note (Signed)
C-Met and lipid profile today ?Continue Rosuvastatin, Metoprolol and Aspirin ?

## 2021-06-20 NOTE — Assessment & Plan Note (Signed)
Controlled on Metoprolol ?Reinforced DASH diet and exercise weight loss ?C-Met today ?

## 2021-06-20 NOTE — Assessment & Plan Note (Signed)
Encourage weight loss as this can help reduce sleep apnea symptoms He is noncompliant with CPAP 

## 2021-06-20 NOTE — Patient Instructions (Signed)

## 2021-06-20 NOTE — Assessment & Plan Note (Signed)
C-Met and lipid profile today Encouraged him to consume a low-fat diet 

## 2021-06-20 NOTE — Assessment & Plan Note (Signed)
Encourage weight loss as this can produce back pain ?Continue Methocarbamol as needed ?

## 2021-06-20 NOTE — Progress Notes (Signed)
? ?Subjective:  ? ? Patient ID: Daniel Bradley., male    DOB: 04/23/1955, 66 y.o.   MRN: [id number] ? ?HPI ? ?Patient presents to clinic today for 81-monthfollow-up of chronic conditions. ? ?HTN: His BP today is 136/77.  He is taking Metoprolol as prescribed.  ECG from 03/2020 reviewed. ? ?HLD with CAD, Aortic Atherosclerosis status post MI: His last LDL was 118, triglycerides 98, 03/2021.  He denies myalgias on Rosuvastatin.  He is taking Metoprolol and Aspirin as well.  He tries to consume a low-fat diet. ? ?DM2: His last A1c was 9.8%, 03/2021.  He is taking Metformin and Glipizide as prescribed.  He does not check his sugars.  He checks his feet routinely.  His last eye exam was 10/2020.  Flu 12/2020.  Pneumovax never.  Prevnar 12/2020.  [place]. ? ?OSA: He averages 2 to 3 hours of sleep per night without the use of his CPAP.  Sleep study from 09/2017 reviewed. ? ?Chronic Low Back Pain: Managed with Methocarbamol as needed.  Lumbar x-ray from 01/2021 reviewed. ? ?Review of Systems ? ?   ?Past Medical History:  ?Diagnosis Date  ? Arthritis of knee   ? 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 Coronary artery disease   ? Decreased libido 01/10/2013  ? Depression   ? DM (diabetes mellitus) ([place]   ? 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 Elevated prostate specific antigen (PSA) 02/21/2013  ? Hemorrhoid   ? History of kidney stones   ? Hyperlipidemia   ? Hypertension   ? Incomplete bladder emptying 01/10/2013  ? [place]ignant [place] 04/28/2013  ? prostate removed  ? Myocardial infarction ([facility] 2019  ? 2 stents placed  ? Obstructive sleep apnea on CPAP   ? does not use cpap, does not snore much since loosing 80 lbs  ? [place]   ? ? ?Current Outpatient Medications  ?Medication Sig Dispense Refill  ? aspirin EC 81 MG tablet 81 mg daily. Swallow whole.    ? Cholecalciferol 125 MCG (5000 UT) capsule Take by mouth.    ? diphenhydramine-acetaminophen (TYLENOL PM) 25-500  MG TABS tablet Take 2 tablets by mouth at bedtime as needed.    ? glipiZIDE (GLUCOTROL) 10 MG tablet TAKE 1 TABLET(10 MG) BY MOUTH TWICE DAILY BEFORE A MEAL 180 tablet 0  ? metFORMIN (GLUCOPHAGE) 850 MG tablet Take 1 tablet (850 mg total) by mouth 2 (two) times daily with a meal. 180 tablet 2  ? methocarbamol (ROBAXIN) 500 MG tablet Take 1 tablet (500 mg total) by mouth daily as needed for muscle spasms. 30 tablet 1  ? metoprolol tartrate (LOPRESSOR) 25 MG tablet Take 1 tablet (25 mg total) by mouth 2 (two) times daily. 60 tablet 2  ? rosuvastatin (CRESTOR) 10 MG tablet TAKE 1 TABLET(10 MG) BY MOUTH DAILY 90 tablet 0  ? senna (SENOKOT) 8.6 MG tablet Take 2 tablets by mouth daily.    ? ?No current facility-administered medications for this visit.  ? ? ?No Known Allergies ? ?Family History  ?Problem Relation Age of Onset  ? Pancreatic cancer Brother   ? Alcoholism Mother   ? Alcoholism Father   ? Prostate cancer Neg Hx   ? Kidney disease Neg Hx   ? ? ?Social History  ? ?Socioeconomic History  ? Marital status: Single  ?  Spouse name: Not on file  ? Number of children: Not on file  ? Years of education: Not  on file  ? Highest education level: Not on file  ?Occupational History  ? Occupation: [employer]  ?  Employer: [employer]  ?  Comment: retired  ?Tobacco Use  ? Smoking status: Never  ? Smokeless tobacco: Never  ?Vaping Use  ? Vaping Use: Never used  ?Substance and Sexual Activity  ? Alcohol use: No  ?  Alcohol/week: 0.0 standard drinks  ?  Comment: rare  ? Drug use: No  ? Sexual activity: Not Currently  ?Other Topics Concern  ? Not on file  ?Social History Narrative  ? Lives with girlfriend, April  ? ?Social Determinants of Health  ? ?Financial Resource Strain: Not on file  ?Food Insecurity: Not on file  ?Transportation Needs: Not on file  ?Physical Activity: Not on file  ?Stress: Not on file  ?Social Connections: Not on file  ?Intimate Partner Violence: Not on file  ? ? ? ?Constitutional: Denies fever,  malaise, fatigue, headache or abrupt weight changes.  ?HEENT: Denies eye pain, eye redness, ear pain, ringing in the ears, wax buildup, runny nose, nasal congestion, bloody nose, or sore throat. ?Respiratory: Denies difficulty breathing, shortness of breath, cough or sputum production.   ?Cardiovascular: Denies chest pain, chest tightness, palpitations or swelling in the hands or feet.  ?Gastrointestinal: Denies abdominal pain, bloating, constipation, diarrhea or blood in the stool.  ?GU: Denies urgency, frequency, pain with urination, burning sensation, blood in urine, odor or discharge. ?Musculoskeletal: Patient reports chronic low back pain.  Denies decrease in range of motion, difficulty with gait, or joint swelling.  ?Skin: Denies redness, rashes, lesions or ulcercations.  ?Neurological: Denies dizziness, difficulty with memory, difficulty with speech or problems with balance and coordination.  ?Psych: Denies anxiety, depression, SI/HI. ? ?No other specific complaints in a complete review of systems (except as listed in HPI above). ? ?Objective:  ? Physical Exam ?BP 136/77 (BP Location: Left Arm, Patient Position: Sitting, Cuff Size: Large)   Pulse 81   Temp 97.7 ?F (36.5 ?C) (Temporal)   Wt 294 lb (133.4 kg)   SpO2 99%   BMI 38.79 kg/m?  ? ?Wt Readings from Last 3 Encounters:  ?03/19/21 294 lb (133.4 kg)  ?01/10/21 286 lb 2.5 oz (129.8 kg)  ?12/17/20 286 lb 3.2 oz (129.8 kg)  ? ? ?General: Appears his stated age, obese, in NAD. ?Skin: Warm, dry and intact. No ulcerations noted. ?HEENT: Head: normal shape and size; Eyes: sclera white, no icterus, conjunctiva pink, PERRLA and EOMs intact;  ?Cardiovascular: Normal rate and rhythm. S1,S2 noted.  No murmur, rubs or gallops noted. No JVD or BLE edema. No carotid bruits noted. ?Pulmonary/Chest: Normal effort and positive vesicular breath sounds. No respiratory distress. No wheezes, rales or ronchi noted.  ?Musculoskeletal: No difficulty with gait.   ?Neurological: Alert and oriented.  Coordination normal.  ? ? ?BMET ?   ?Component Value Date/Time  ? NA 137 03/19/2021 1116  ? NA 139 03/30/2020 1015  ? NA 139 04/21/2012 0641  ? K 4.8 03/19/2021 1116  ? K 3.8 04/21/2012 0641  ? CL 101 03/19/2021 1116  ? CL 107 04/21/2012 0641  ? CO2 26 03/19/2021 1116  ? CO2 27 04/21/2012 0641  ? GLUCOSE 235 (H) 03/19/2021 1116  ? GLUCOSE 212 (H) 04/21/2012 0641  ? BUN 21 03/19/2021 1116  ? BUN 14 03/30/2020 1015  ? BUN 22 (H) 04/21/2012 0641  ? CREATININE 0.93 03/19/2021 1116  ? CALCIUM 10.1 03/19/2021 1116  ? CALCIUM 8.7 04/21/2012 0641  ?  GFRNONAA 86 03/30/2020 1015  ? GFRNONAA 51 (L) 04/21/2012 0641  ? GFRAA 100 03/30/2020 1015  ? GFRAA 59 (L) 04/21/2012 0641  ? ? ?Lipid Panel  ?   ?Component Value Date/Time  ? CHOL 191 03/19/2021 1116  ? CHOL 128 03/30/2020 1015  ? TRIG 98 03/19/2021 1116  ? HDL 53 03/19/2021 1116  ? HDL 41 03/30/2020 1015  ? CHOLHDL 3.6 03/19/2021 1116  ? VLDL 14 08/20/2017 0359  ? LDLCALC 118 (H) 03/19/2021 1116  ? ? ?CBC ?   ?Component Value Date/Time  ? WBC 5.5 12/17/2020 1111  ? RBC 5.39 12/17/2020 1111  ? HGB 13.8 12/17/2020 1111  ? HGB 14.0 03/30/2020 1015  ? HCT 43.9 12/17/2020 1111  ? HCT 42.5 03/30/2020 1015  ? PLT 305 12/17/2020 1111  ? PLT 306 03/30/2020 1015  ? MCV 81.4 12/17/2020 1111  ? MCV 79 03/30/2020 1015  ? MCV 80 04/21/2012 0641  ? MCH 25.6 (L) 12/17/2020 1111  ? MCHC 31.4 (L) 12/17/2020 1111  ? RDW 14.7 12/17/2020 1111  ? RDW 14.6 03/30/2020 1015  ? RDW 14.9 (H) 04/21/2012 0641  ? LYMPHSABS 1.8 03/30/2020 1015  ? MONOABS 0.5 04/03/2019 0757  ? EOSABS 0.3 03/30/2020 1015  ? BASOSABS 0.1 03/30/2020 1015  ? ? ?Hgb A1C ?Lab Results  ?Component Value Date  ? HGBA1C 9.8 (A) 03/19/2021  ? ? ? ? ? ? ? ?   ?Assessment & Plan:  ? ? ? ?Webb Silversmith, NP ? ?

## 2021-06-20 NOTE — Assessment & Plan Note (Signed)
Encourage diet and exercise weight loss 

## 2021-06-20 NOTE — Assessment & Plan Note (Signed)
POCT A1c 10.2% ?We will check urine microalbumin 12/2021 ?Encouraged him to consume a low-carb diet and exercise weight loss ?Continue Metformin and Glipizide ?We will add Rybelsus 3 mg daily ?Encourage routine eye exam ?Encourage routine foot exam ?Flu and Prevnar UTD ?Encouraged him to get a COVID booster ?He will follow-up Pneumovax 12/2021 ?

## 2021-06-21 ENCOUNTER — Other Ambulatory Visit: Payer: Self-pay

## 2021-06-21 ENCOUNTER — Telehealth: Payer: Self-pay

## 2021-06-21 DIAGNOSIS — Z1211 Encounter for screening for malignant neoplasm of colon: Secondary | ICD-10-CM

## 2021-06-21 LAB — COMPREHENSIVE METABOLIC PANEL
AG Ratio: 1.4 (calc) (ref 1.0–2.5)
ALT: 26 U/L (ref 9–46)
AST: 19 U/L (ref 10–35)
Albumin: 4.2 g/dL (ref 3.6–5.1)
Alkaline phosphatase (APISO): 115 U/L (ref 35–144)
BUN: 17 mg/dL (ref 7–25)
CO2: 26 mmol/L (ref 20–32)
Calcium: 10.2 mg/dL (ref 8.6–10.3)
Chloride: 102 mmol/L (ref 98–110)
Creat: 0.95 mg/dL (ref 0.70–1.35)
Globulin: 2.9 g/dL (calc) (ref 1.9–3.7)
Glucose, Bld: 274 mg/dL — ABNORMAL HIGH (ref 65–139)
Potassium: 4.4 mmol/L (ref 3.5–5.3)
Sodium: 138 mmol/L (ref 135–146)
Total Bilirubin: 0.5 mg/dL (ref 0.2–1.2)
Total Protein: 7.1 g/dL (ref 6.1–8.1)

## 2021-06-21 LAB — LIPID PANEL
Cholesterol: 198 mg/dL (ref <200)
HDL: 51 mg/dL (ref >=40)
LDL Cholesterol (Calc): 118 mg/dL (calc) — ABNORMAL HIGH
Non-HDL Cholesterol (Calc): 147 mg/dL (calc) — ABNORMAL HIGH (ref <130)
Total CHOL/HDL Ratio: 3.9 (calc) (ref <5.0)
Triglycerides: 177 mg/dL — ABNORMAL HIGH (ref <150)

## 2021-06-21 MED ORDER — ROSUVASTATIN CALCIUM 20 MG PO TABS: 20.0000 mg | ORAL_TABLET | Freq: Every day | ORAL | 0 refills | Status: DC

## 2021-06-21 MED ORDER — PEG 3350-KCL-NA BICARB-NACL 420 G PO SOLR: 4000.0000 mL | Freq: Once | ORAL | 0 refills | Status: AC

## 2021-06-21 NOTE — Telephone Encounter (Signed)
Gastroenterology Pre-Procedure Review ? ?Request Date: 07/10/21 ?Requesting Physician: Dr. Vicente Males ? ?PATIENT REVIEW QUESTIONS: The patient responded to the following health history questions as indicated:   ? ?1. Are you having any GI issues? no ?2. Do you have a personal history of Polyps? no ?3. Do you have a family history of Colon Cancer or Polyps? no ?4. Diabetes Mellitus? yes (type 2) ?5. Joint replacements in the past 12 months? Knee surgery 2-3 years ago ?6. Major health problems in the past 3 months?no ?7. Any artificial heart valves, MVP, or defibrillator? Heart attack 2019 ?   ?MEDICATIONS & ALLERGIES:    ?Patient reports the following regarding taking any anticoagulation/antiplatelet therapy:   ?Plavix, Coumadin, Eliquis, Xarelto, Lovenox, Pradaxa, Brilinta, or Effient? no ?Aspirin? yes (81 mg daily) ? ?Patient confirms/reports the following medications:  ?Current Outpatient Medications  ?Medication Sig Dispense Refill  ? aspirin EC 81 MG tablet 81 mg daily. Swallow whole.    ? Cholecalciferol 125 MCG (5000 UT) capsule Take by mouth.    ? diphenhydramine-acetaminophen (TYLENOL PM) 25-500 MG TABS tablet Take 2 tablets by mouth at bedtime as needed.    ? glipiZIDE (GLUCOTROL) 10 MG tablet TAKE 1 TABLET(10 MG) BY MOUTH TWICE DAILY BEFORE A MEAL 180 tablet 0  ? metFORMIN (GLUCOPHAGE) 850 MG tablet Take 1 tablet (850 mg total) by mouth 2 (two) times daily with a meal. 180 tablet 2  ? methocarbamol (ROBAXIN) 500 MG tablet Take 1 tablet (500 mg total) by mouth daily as needed for muscle spasms. 30 tablet 1  ? metoprolol tartrate (LOPRESSOR) 25 MG tablet Take 1 tablet (25 mg total) by mouth 2 (two) times daily. 60 tablet 2  ? rosuvastatin (CRESTOR) 10 MG tablet TAKE 1 TABLET(10 MG) BY MOUTH DAILY 90 tablet 0  ? Semaglutide (RYBELSUS) 3 MG TABS Take 3 mg by mouth daily. 90 tablet 0  ? senna (SENOKOT) 8.6 MG tablet Take 2 tablets by mouth daily.    ? ?No current facility-administered medications for this visit.   ? ? ?Patient confirms/reports the following allergies:  ?No Known Allergies ? ?No orders of the defined types were placed in this encounter. ? ? ?AUTHORIZATION INFORMATION ?Primary Insurance: ?1D#: ?Group #: ? ?Secondary Insurance: ?1D#: ?Group #: ? ?SCHEDULE INFORMATION: ?Date: 07/10/21 ?Time: ?Location: [facility] ?

## 2021-06-21 NOTE — Progress Notes (Signed)
Cardiac clearance sent to Dr. Ubaldo Glassing for patient's scheduled colonoscopy on 07/10/21. ?Pt has history of MI and was last seen by Dr. Ubaldo Glassing 07/19/2020. ? ?Thanks, ?Sharyn Lull, CMA ?

## 2021-06-21 NOTE — Telephone Encounter (Signed)
-----   Message from [name], NP sent at 06/21/2021 11:09 AM EDT ----- ?Liver and kidney function is normal.  Cholesterol is worse.  We switched him from atorvastatin to rosuvastatin at his last visit.  Please verify that he is taking this daily as prescribed.  If so, we need to increase to 20 mg daily.  Please send in #90, 0 refills.  He really needs to work on a low-carb, low saturated fat diet and exercise for weight loss.  He should follow-up with me in 3 months. ?

## 2021-06-21 NOTE — Telephone Encounter (Signed)
Pt advised.  He is taking rosuvastatin regularly.  He agreed to increase to '20mg'$  daily.  RX sent to [facility] in [place].  Follow up apt scheduled for 10/01/2021 at 8:20am ? ? ?Thanks,  ? ?-Mickel Baas  ?

## 2021-06-23 ENCOUNTER — Emergency Department: Payer: Medicare HMO

## 2021-06-23 ENCOUNTER — Other Ambulatory Visit: Payer: Self-pay

## 2021-06-23 ENCOUNTER — Emergency Department
Admission: EM | Admit: 2021-06-23 | Discharge: 2021-06-24 | Disposition: A | Discharge status: Home / Self Care | Payer: Medicare HMO | Attending: Emergency Medicine | Admitting: Emergency Medicine

## 2021-06-23 DIAGNOSIS — N202 Calculus of kidney with calculus of ureter: Secondary | ICD-10-CM | POA: Diagnosis not present

## 2021-06-23 DIAGNOSIS — E1165 Type 2 diabetes mellitus with hyperglycemia: Secondary | ICD-10-CM | POA: Diagnosis not present

## 2021-06-23 DIAGNOSIS — N201 Calculus of ureter: Secondary | ICD-10-CM

## 2021-06-23 DIAGNOSIS — N200 Calculus of kidney: Secondary | ICD-10-CM

## 2021-06-23 DIAGNOSIS — R109 Unspecified abdominal pain: Secondary | ICD-10-CM | POA: Diagnosis not present

## 2021-06-23 DIAGNOSIS — M47816 Spondylosis without myelopathy or radiculopathy, lumbar region: Secondary | ICD-10-CM | POA: Diagnosis not present

## 2021-06-23 DIAGNOSIS — N132 Hydronephrosis with renal and ureteral calculous obstruction: Secondary | ICD-10-CM | POA: Diagnosis not present

## 2021-06-23 DIAGNOSIS — I10 Essential (primary) hypertension: Secondary | ICD-10-CM | POA: Insufficient documentation

## 2021-06-23 DIAGNOSIS — K449 Diaphragmatic hernia without obstruction or gangrene: Secondary | ICD-10-CM | POA: Diagnosis not present

## 2021-06-23 DIAGNOSIS — N3289 Other specified disorders of bladder: Secondary | ICD-10-CM | POA: Diagnosis not present

## 2021-06-23 LAB — CBC
HCT: 44.2 % (ref 39.0–52.0)
Hemoglobin: 13.7 g/dL (ref 13.0–17.0)
MCH: 24.8 pg — ABNORMAL LOW (ref 26.0–34.0)
MCHC: 31 g/dL (ref 30.0–36.0)
MCV: 80.1 fL (ref 80.0–100.0)
Platelets: 289 10*3/uL (ref 150–400)
RBC: 5.52 MIL/uL (ref 4.22–5.81)
RDW: 14.4 % (ref 11.5–15.5)
WBC: 9.6 10*3/uL (ref 4.0–10.5)
nRBC: 0 % (ref 0.0–0.2)

## 2021-06-23 LAB — URINALYSIS, ROUTINE W REFLEX MICROSCOPIC
Bacteria, UA: NONE SEEN
Bilirubin Urine: NEGATIVE
Glucose, UA: >=500 mg/dL — AB
Ketones, ur: 80 mg/dL — AB
Leukocytes,Ua: NEGATIVE
Nitrite: NEGATIVE
Protein, ur: NEGATIVE mg/dL
RBC / HPF: >50 RBC/hpf — ABNORMAL HIGH (ref 0–5)
Specific Gravity, Urine: 1.03 (ref 1.005–1.030)
Squamous Epithelial / HPF: NONE SEEN (ref 0–5)
pH: 5 (ref 5.0–8.0)

## 2021-06-23 LAB — BASIC METABOLIC PANEL
Anion gap: 11 (ref 5–15)
BUN: 19 mg/dL (ref 8–23)
CO2: 21 mmol/L — ABNORMAL LOW (ref 22–32)
Calcium: 9.3 mg/dL (ref 8.9–10.3)
Chloride: 106 mmol/L (ref 98–111)
Creatinine, Ser: 1.24 mg/dL (ref 0.61–1.24)
GFR, Estimated: >60 mL/min (ref >60)
Glucose, Bld: 313 mg/dL — ABNORMAL HIGH (ref 70–99)
Potassium: 4.1 mmol/L (ref 3.5–5.1)
Sodium: 138 mmol/L (ref 135–145)

## 2021-06-23 MED ORDER — KETOROLAC TROMETHAMINE 30 MG/ML IJ SOLN
15.0000 mg | Freq: Once | INTRAMUSCULAR | Status: AC
  Administered 2021-06-23: 15 mg via INTRAVENOUS
  Filled 2021-06-23: qty 1

## 2021-06-23 MED ORDER — LACTATED RINGERS IV BOLUS
1000.0000 mL | Freq: Once | INTRAVENOUS | Status: AC
  Administered 2021-06-24: 1000 mL via INTRAVENOUS

## 2021-06-23 MED ORDER — MORPHINE SULFATE (PF) 4 MG/ML IV SOLN
4.0000 mg | Freq: Once | INTRAVENOUS | Status: AC
  Administered 2021-06-23: 4 mg via INTRAVENOUS
  Filled 2021-06-23: qty 1

## 2021-06-23 MED ORDER — ONDANSETRON HCL 4 MG/2ML IJ SOLN
4.0000 mg | INTRAMUSCULAR | Status: AC
  Administered 2021-06-23: 4 mg via INTRAVENOUS
  Filled 2021-06-23: qty 2

## 2021-06-23 NOTE — ED Provider Notes (Signed)
? ?[facility] ?Provider Note ? ? ? Event Date/Time  ? First MD Initiated Contact with Patient 06/23/21 2257   ?  (approximate) ? ? ?History  ? ?Flank Pain ? ? ?HPI ? ?Daniel Bradley. is a 66 y.o. male whose history includes non-insulin-dependent diabetes, hypertension, and 2 prior episodes of kidney stones that did not require urological intervention.  He presents tonight by private vehicle for evaluation of a cute onset left side pain that is somewhere between his flank and his left side of his abdomen.  It is sharp and pressure-like and feels similar to prior episodes of kidney stones.  He has also had nausea and several episodes of vomiting.  The pain is constant but waxes and wanes in severity.  No gross blood when he urinates.  No recent fever.  The onset of symptoms was acute and he was not feeling ill before hand.  No recent trauma. ?  ? ? ?Physical Exam  ? ?Triage Vital Signs: ?ED Triage Vitals [06/23/21 2128]  ?Enc Vitals Group  ?   BP (!) 149/67  ?   Pulse Rate 88  ?   Resp 17  ?   Temp 98.9 ?F (37.2 ?C)  ?   Temp Source Oral  ?   SpO2 94 %  ?   Weight 131.5 kg (290 lb)  ?   Height 1.854 m ('6\' 1"'$ )  ?   Head Circumference   ?   Peak Flow   ?   Pain Score 10  ?   Pain Loc   ?   Pain Edu?   ?   Excl. in [place]?   ? ? ?Most recent vital signs: ?Vitals:  ? 06/24/21 0030 06/24/21 0100  ?BP: (!) 144/83 138/81  ?Pulse: (!) 102 (!) 107  ?Resp: 19 17  ?Temp:    ?SpO2: 95% 95%  ? ? ? ?General: Awake, no distress though he does appear somewhat uncomfortable. ?CV:  Good peripheral perfusion.  ?Resp:  Normal effort.  ?Abd:  Mild tenderness to palpation throughout the left side of the abdomen, no rebound or guarding.  Mild reproducible tenderness to percussion of the left flank. ? ? ?ED Results / Procedures / Treatments  ? ?Labs ?(all labs ordered are listed, but only abnormal results are displayed) ?Labs Reviewed  ?URINALYSIS, ROUTINE W REFLEX MICROSCOPIC - Abnormal; Notable for the following  components:  ?    Result Value  ? Color, Urine YELLOW (*)   ? APPearance CLEAR (*)   ? Glucose, UA >=500 (*)   ? Hgb urine dipstick LARGE (*)   ? Ketones, ur 80 (*)   ? RBC / HPF >50 (*)   ? All other components within normal limits  ?BASIC METABOLIC PANEL - Abnormal; Notable for the following components:  ? CO2 21 (*)   ? Glucose, Bld 313 (*)   ? All other components within normal limits  ?CBC - Abnormal; Notable for the following components:  ? MCH 24.8 (*)   ? All other components within normal limits  ?URINE CULTURE  ? ? ? ? ?RADIOLOGY ?I personally viewed and interpreted the patient's two-view abdomen x-rays and I see no evidence of acute abnormality. ? ?I also personally viewed and interpreted the patient's CT scan which is notable for ureteral stone and renal calculus. ? ? ? ?PROCEDURES: ? ?Critical Care performed: No ? ?Procedures ? ? ?MEDICATIONS ORDERED IN ED: ?Medications  ?morphine (PF) 4 MG/ML injection 4 mg (has no  administration in time range)  ?oxyCODONE-acetaminophen (PERCOCET/ROXICET) 5-325 MG per tablet 2 tablet (has no administration in time range)  ?morphine (PF) 4 MG/ML injection 4 mg (4 mg Intravenous Given 06/23/21 2341)  ?ketorolac (TORADOL) 30 MG/ML injection 15 mg (15 mg Intravenous Given 06/23/21 2341)  ?ondansetron [facility] injection 4 mg (4 mg Intravenous Given 06/23/21 2341)  ?lactated ringers bolus 1,000 mL (1,000 mLs Intravenous New Bag/Given 06/24/21 0007)  ? ? ? ?IMPRESSION / MDM / ASSESSMENT AND PLAN / ED COURSE  ?I reviewed the triage vital signs and the nursing notes. ?             ?               ? ?Differential diagnosis includes, but is not limited to, renal/ureteral colic, UTI/pyelonephritis, infected stone, musculoskeletal strain, diverticulitis. ? ?Vital signs are stable other than some mild hypertension.  Patient has a history of kidney stones and appears uncomfortable and his symptoms are consistent with new ureteral stone.  Given multiple episodes in the past that resolved  on their own, I will see if I can avoid getting a CT renal stone protocol study and instead try to identify the ureteral stone with a 2 view abdominal x-ray series.  Patient is agreeable to this plan.  I am treating his symptoms with morphine 4 mg IV, Toradol 15 mg IV, and Zofran 4 mg IV. ? ?Labs ordered include basic metabolic panel, CBC, urinalysis, and urine culture. ? ?I reviewed the results and they are notable for hyperglycemia at 313 which is likely chronic from his diabetes and not indicative of an acute issue.  CBC is within normal limits.  Urinalysis shows hemoglobinuria and ketones and RBCs, no WBCs nor bacteria.  Ordering LR 1 L IV bolus given the ketones and apparent volume depletion. ? ?If the patient's symptoms are the result of an acute infected stone, this could represent a life-threatening condition, but I think it is unlikely based on his current urinalysis and lack of leukocytosis and lack of fever.  Medical complexity is higher due to his chronic diabetes and hypertension, but if we can control his pain adequately and identify a reasonably sized stone for normal passage, he may be able to be discharged for outpatient follow-up.  He agrees with the plan. ? ? ?Clinical Course as of 06/24/21 0131  ?Mon Jun 24, 2021  ?[place] 2 Views ?I personally viewed and interpreted the patient's abdominal x-rays.  I could not visualize any ureteral stones.  Neither could the radiologist.  Given the pain and need for a specific diagnosis and nondiagnostic x-rays, I ordered a CT renal stone study to verify the presumed diagnosis of ureteral stone. [CF]  ?0127 CT Renal Stone Study ?I personally viewed and interpreted the patient's CT scan and I can see the renal calculi as well as the ureteral stone.  The radiologist commented on a left-sided stone just above the UVJ.  ? ?I reassessed the patient and he said he is still having some pain but is much improved from before.  The current plan is for 4 mg of IV  morphine (second round) and 2 Percocet which will last longer, and then discharged for outpatient follow-up with urology.  He and his girlfriend understand and agree with this plan.  There is no evidence that he needs emergent urological intervention or admission at this time and there is no evidence of acute infection.  I gave my usual customary kidney stone discussion including  return precautions. [CF]  ?  ?Clinical Course User Index ?[CF] Hinda Kehr, MD  ? ? ? ?FINAL CLINICAL IMPRESSION(S) / ED DIAGNOSES  ? ?Final diagnoses:  ?Kidney stone  ?Left ureteral stone  ? ? ? ?Rx / DC Orders  ? ?ED Discharge Orders   ? ?      Ordered  ?  oxyCODONE-acetaminophen (PERCOCET) 5-325 MG tablet  Every 8 hours PRN       ? 06/24/21 0129  ?  ondansetron (ZOFRAN-ODT) 4 MG disintegrating tablet       ? 06/24/21 0129  ?  tamsulosin (FLOMAX) 0.4 MG CAPS capsule       ? 06/24/21 0129  ?  docusate sodium (COLACE) 100 MG capsule       ? 06/24/21 0129  ? ?  ?  ? ?  ? ? ? ?Note:  This document was prepared using Dragon voice recognition software and may include unintentional dictation errors. ?  ?Hinda Kehr, MD ?06/24/21 0131 ? ?

## 2021-06-23 NOTE — ED Notes (Signed)
Pt given a warm blanket 

## 2021-06-23 NOTE — ED Triage Notes (Signed)
Pt has hx of kidney stones and has pain in left flank. Pt states it feels like previous stones. Pt began having nausea, vomiting and diarrhea over the last 2 hours.  ?

## 2021-06-24 ENCOUNTER — Emergency Department: Payer: Medicare HMO

## 2021-06-24 DIAGNOSIS — N132 Hydronephrosis with renal and ureteral calculous obstruction: Secondary | ICD-10-CM | POA: Diagnosis not present

## 2021-06-24 DIAGNOSIS — N3289 Other specified disorders of bladder: Secondary | ICD-10-CM | POA: Diagnosis not present

## 2021-06-24 DIAGNOSIS — R109 Unspecified abdominal pain: Secondary | ICD-10-CM | POA: Diagnosis not present

## 2021-06-24 DIAGNOSIS — M47816 Spondylosis without myelopathy or radiculopathy, lumbar region: Secondary | ICD-10-CM | POA: Diagnosis not present

## 2021-06-24 DIAGNOSIS — K449 Diaphragmatic hernia without obstruction or gangrene: Secondary | ICD-10-CM | POA: Diagnosis not present

## 2021-06-24 MED ORDER — OXYCODONE-ACETAMINOPHEN 5-325 MG PO TABS
2.0000 | ORAL_TABLET | Freq: Once | ORAL | Status: AC
  Administered 2021-06-24: 2 via ORAL
  Filled 2021-06-24: qty 2

## 2021-06-24 MED ORDER — TAMSULOSIN HCL 0.4 MG PO CAPS: ORAL_CAPSULE | ORAL | 0 refills | Status: DC

## 2021-06-24 MED ORDER — ONDANSETRON 4 MG PO TBDP
ORAL_TABLET | ORAL | 0 refills | Status: DC
Start: 2021-06-24 — End: 2021-08-20

## 2021-06-24 MED ORDER — OXYCODONE-ACETAMINOPHEN 5-325 MG PO TABS: 2.0000 | ORAL_TABLET | Freq: Three times a day (TID) | ORAL | 0 refills | Status: DC | PRN

## 2021-06-24 MED ORDER — DOCUSATE SODIUM 100 MG PO CAPS: ORAL_CAPSULE | ORAL | 0 refills | Status: DC

## 2021-06-24 MED ORDER — MORPHINE SULFATE (PF) 4 MG/ML IV SOLN
4.0000 mg | Freq: Once | INTRAVENOUS | Status: AC
  Administered 2021-06-24: 4 mg via INTRAVENOUS
  Filled 2021-06-24: qty 1

## 2021-06-24 NOTE — ED Notes (Signed)
Patient return from x-ray 

## 2021-06-24 NOTE — Discharge Instructions (Addendum)
You have been seen in the Emergency Department (ED) today for pain caused by kidney stones.  As we have discussed, please drink plenty of fluids.  Please make a follow up appointment with the physician(s) listed elsewhere in this documentation.  You may take pain medication as needed but ONLY as prescribed.  Please also take your prescribed Flomax daily.  If you are going to follow up with Urology for possible lithotripsy, please do not take any ibuprofen, naproxen, aspirin, Toradol, or other NSAID after Tuesday morning, as this may exclude you from lithotripsy.  Please see your doctor as soon as possible as stones may take 1-3 weeks to pass and you may require additional care or medications.  Do not drink alcohol, drive or participate in any other potentially dangerous activities while taking opiate pain medication as it may make you sleepy. Do not take this medication with any other sedating medications, either prescription or over-the-counter. If you were prescribed Percocet or Vicodin, do not take these with acetaminophen (Tylenol) as it is already contained within these medications.   Take Percocet as needed for severe pain.  This medication is an opiate (or narcotic) pain medication and can be habit forming.  Use it as little as possible to achieve adequate pain control.  Do not use or use it with extreme caution if you have a history of opiate abuse or dependence.  If you are on a pain contract with your primary care doctor or a pain specialist, be sure to let them know you were prescribed this medication today from the [facility].  This medication is intended for your use only - do not give any to anyone else and keep it in a secure place where nobody else, especially children, have access to it.  It will also cause or worsen constipation, so you may want to consider taking an over-the-counter stool softener while you are taking this medication.  Return to the Emergency  Department (ED) or call your doctor if you have any worsening pain, fever, painful urination, are unable to urinate, or develop other symptoms that concern you.  

## 2021-06-25 LAB — URINE CULTURE

## 2021-07-01 ENCOUNTER — Telehealth: Payer: Self-pay

## 2021-07-01 ENCOUNTER — Telehealth: Payer: Self-pay | Admitting: Gastroenterology

## 2021-07-01 NOTE — Telephone Encounter (Signed)
Copied from [place] [id number]. Topic: General - Other >> Jul 01, 2021  1:36 PM Valere Dross wrote: Reason for CRM: Pt called in about his colonoscopy referral he had, and he stated once he reached out to them and wanted someone to explain what all would be going on, they told him that it wasn't needed, and pt is just wanting to postpone doing the colonoscopy til a later time, but wants to keep it at that office still. FYI

## 2021-07-01 NOTE — Telephone Encounter (Signed)
I called Mr. Dolecki.  He just wanted to let us know he is going to cancel is colonoscopy for now.  He will call back if he needs a new referral.  I advised him to call Dr. Georgeann Oppenheim office to cancel he states he will give them a call next.   Thanks,   -Mickel Baas

## 2021-07-01 NOTE — Telephone Encounter (Signed)
Patient called to reschedule colonoscopy.

## 2021-07-02 NOTE — Telephone Encounter (Signed)
Noted  

## 2021-07-03 ENCOUNTER — Other Ambulatory Visit: Payer: Self-pay

## 2021-07-03 DIAGNOSIS — Z1211 Encounter for screening for malignant neoplasm of colon: Secondary | ICD-10-CM

## 2021-07-03 NOTE — Telephone Encounter (Signed)
Called patient back and he stated that he would like to reschedule his colonoscopy. I asked him by when he would like to reschedule his colonoscopy and he agreed on August 28, 2021 at [facility] with Dr. Vicente Males. I told him that I would send him a copy of his instructions via MyChart and by mail. Patient understood and had no further questions.

## 2021-07-10 ENCOUNTER — Encounter: Admission: RE | Payer: Self-pay | Source: Home / Self Care

## 2021-07-10 ENCOUNTER — Ambulatory Visit: Admission: RE | Admit: 2021-07-10 | Payer: Medicare HMO | Source: Home / Self Care | Admitting: Gastroenterology

## 2021-07-10 SURGERY — COLONOSCOPY WITH PROPOFOL
Anesthesia: General

## 2021-08-20 ENCOUNTER — Ambulatory Visit (INDEPENDENT_AMBULATORY_CARE_PROVIDER_SITE_OTHER): Payer: Medicare HMO | Admitting: Family Medicine

## 2021-08-20 ENCOUNTER — Ambulatory Visit
Admission: RE | Admit: 2021-08-20 | Discharge: 2021-08-20 | Disposition: A | Discharge status: Home / Self Care | Payer: Medicare HMO | Source: Ambulatory Visit | Attending: Family Medicine | Admitting: Family Medicine

## 2021-08-20 ENCOUNTER — Encounter: Payer: Self-pay | Admitting: Family Medicine

## 2021-08-20 VITALS — BP 140/73 | HR 84 | Ht 73.0 in | Wt 289.8 lb

## 2021-08-20 DIAGNOSIS — M47816 Spondylosis without myelopathy or radiculopathy, lumbar region: Secondary | ICD-10-CM | POA: Diagnosis not present

## 2021-08-20 DIAGNOSIS — R1031 Right lower quadrant pain: Secondary | ICD-10-CM | POA: Diagnosis not present

## 2021-08-20 DIAGNOSIS — M1611 Unilateral primary osteoarthritis, right hip: Secondary | ICD-10-CM

## 2021-08-20 DIAGNOSIS — M25751 Osteophyte, right hip: Secondary | ICD-10-CM | POA: Diagnosis not present

## 2021-08-20 MED ORDER — METHOCARBAMOL 500 MG PO TABS: 500.0000 mg | ORAL_TABLET | Freq: Every day | ORAL | 1 refills | Status: DC | PRN

## 2021-08-20 NOTE — Patient Instructions (Addendum)
Thank you for coming to the office today.  Likely Hip joint issue with arthritis wear and tear, can be pinching on the muscle / nerve  Goal is to rehab, and get back to more exercise for Right hip  Take muscle relaxant Methocarbamol Robaxin as needed, caution sedation.  Recommend to start taking Tylenol Extra Strength '500mg'$  tabs - take 1 to 2 tabs per dose (max '1000mg'$ ) every 6-8 hours for pain (take regularly, don't skip a dose for next 7 days), max 24 hour daily dose is 6 tablets or '3000mg'$ . In the future you can repeat the same everyday Tylenol course for 1-2 weeks at a time.   START anti inflammatory topical - OTC Voltaren (generic Diclofenac) topical 2-4 times a day as needed for pain swelling of affected joint for 1-2 weeks or longer.  X-ray Right hip, stay tuned  If needed we can refer to Orthopedic or Physical Therapy.  Please schedule a Follow-up Appointment to: Return in about 4 weeks (around 09/17/2021), or if symptoms worsen or fail to improve, for 4 weeks as needed hip / groin pain if not improved.  If you have any other questions or concerns, please feel free to call the office or send a message through [place]. You may also schedule an earlier appointment if necessary.  Additionally, you may be receiving a survey about your experience at our office within a few days to 1 week by e-mail or mail. We value your feedback.  Nobie Putnam, DO [facility], [facility]  Hip Exercises Ask your health care provider which exercises are safe for you. Do exercises exactly as told by your health care provider and adjust them as directed. It is normal to feel mild stretching, pulling, tightness, or discomfort as you do these exercises. Stop right away if you feel sudden pain or your pain gets worse. Do not begin these exercises until told by your health care provider. Stretching and range-of-motion exercises These exercises warm up your muscles and joints and improve the  movement and flexibility of your hip. These exercises also help to relieve pain, numbness, and tingling. You may be asked to limit your range of motion if you had a hip replacement. Talk to your health care provider about these restrictions. Hamstrings, supine  Lie on your back (supine position). Loop a belt or towel over the ball of your left / right foot. The ball of your foot is on the walking surface, right under your toes. Straighten your left / right knee and slowly pull on the belt or towel to raise your leg until you feel a gentle stretch behind your knee (hamstring). Do not let your knee bend while you do this. Keep your other leg flat on the floor. Hold this position for __________ seconds. Slowly return your leg to the starting position. Repeat __________ times. Complete this exercise __________ times a day. Hip rotation  Lie on your back on a firm surface. With your left / right hand, gently pull your left / right knee toward the shoulder that is on the same side of the body. Stop when your knee is pointing toward the ceiling. Hold your left / right ankle with your other hand. Keeping your knee steady, gently pull your left / right ankle toward your other shoulder until you feel a stretch in your buttocks. Keep your hips and shoulders firmly planted while you do this stretch. Hold this position for __________ seconds. Repeat __________ times. Complete this exercise __________ times a  day. Seated stretch This exercise is sometimes called hamstrings and adductors stretch. Sit on the floor with your legs stretched wide. Keep your knees straight during this exercise. Keeping your head and back in a straight line, bend at your waist to reach for your left foot (position A). You should feel a stretch in your right inner thigh (adductors). Hold this position for __________ seconds. Then slowly return to the upright position. Keeping your head and back in a straight line, bend at your  waist to reach forward (position B). You should feel a stretch behind both of your thighs and knees (hamstrings). Hold this position for __________ seconds. Then slowly return to the upright position. Keeping your head and back in a straight line, bend at your waist to reach for your right foot (position C). You should feel a stretch in your left inner thigh (adductors). Hold this position for __________ seconds. Then slowly return to the upright position. Repeat __________ times. Complete this exercise __________ times a day. Lunge This exercise stretches the muscles of the hip (hip flexors). Place your left / right knee on the floor and bend your other knee so that is directly over your ankle. You should be half-kneeling. Keep good posture with your head over your shoulders. Tighten your buttocks to point your tailbone downward. This will prevent your back from arching too much. You should feel a gentle stretch in the front of your left / right thigh and hip. If you do not feel a stretch, slide your other foot forward slightly and then slowly lunge forward with your chest up until your knee once again lines up over your ankle. Make sure your tailbone continues to point downward. Hold this position for __________ seconds. Slowly return to the starting position. Repeat __________ times. Complete this exercise __________ times a day. Strengthening exercises These exercises build strength and endurance in your hip. Endurance is the ability to use your muscles for a long time, even after they get tired. Bridge This exercise strengthens the muscles of your hip (hip extensors). Lie on your back on a firm surface with your knees bent and your feet flat on the floor. Tighten your buttocks muscles and lift your bottom off the floor until the trunk of your body and your hips are level with your thighs. Do not arch your back. You should feel the muscles working in your buttocks and the back of your  thighs. If you do not feel these muscles, slide your feet 1-2 inches (2.5-5 cm) farther away from your buttocks. Hold this position for __________ seconds. Slowly lower your hips to the starting position. Let your muscles relax completely between repetitions. Repeat __________ times. Complete this exercise __________ times a day. Straight leg raises, side-lying This exercise strengthens the muscles that move the hip joint away from the center of the body (hip abductors). Lie on your side with your left / right leg in the top position. Lie so your head, shoulder, hip, and knee line up. You may bend your bottom knee slightly to help you balance. Roll your hips slightly forward, so your hips are stacked directly over each other and your left / right knee is facing forward. Leading with your heel, lift your top leg 4-6 inches (10-15 cm). You should feel the muscles in your top hip lifting. Do not let your foot drift forward. Do not let your knee roll toward the ceiling. Hold this position for __________ seconds. Slowly return to the starting position. Let  your muscles relax completely between repetitions. Repeat __________ times. Complete this exercise __________ times a day. Straight leg raises, side-lying This exercise strengthens the muscles that move the hip joint toward the center of the body (hip adductors). Lie on your side with your left / right leg in the bottom position. Lie so your head, shoulder, hip, and knee line up. You may place your upper foot in front to help you balance. Roll your hips slightly forward, so your hips are stacked directly over each other and your left / right knee is facing forward. Tense the muscles in your inner thigh and lift your bottom leg 4-6 inches (10-15 cm). Hold this position for __________ seconds. Slowly return to the starting position. Let your muscles relax completely between repetitions. Repeat __________ times. Complete this exercise __________  times a day. Straight leg raises, supine This exercise strengthens the muscles in the front of your thigh (quadriceps). Lie on your back (supine position) with your left / right leg extended and your other knee bent. Tense the muscles in the front of your left / right thigh. You should see your kneecap slide up or see increased dimpling just above your knee. Keep these muscles tight as you raise your leg 4-6 inches (10-15 cm) off the floor. Do not let your knee bend. Hold this position for __________ seconds. Keep these muscles tense as you lower your leg. Relax the muscles slowly and completely between repetitions. Repeat __________ times. Complete this exercise __________ times a day. Hip abductors, standing This exercise strengthens the muscles that move the leg and hip joint away from the center of the body (hip abductors). Tie one end of a rubber exercise band or tubing to a secure surface, such as a chair, table, or pole. Loop the other end of the band or tubing around your left / right ankle. Keeping your ankle with the band or tubing directly opposite the secured end, step away until there is tension in the tubing or band. Hold on to a chair, table, or pole as needed for balance. Lift your left / right leg out to your side. While you do this: Keep your back upright. Keep your shoulders over your hips. Keep your toes pointing forward. Make sure to use your hip muscles to slowly lift your leg. Do not tip your body or forcefully lift your leg. Hold this position for __________ seconds. Slowly return to the starting position. Repeat __________ times. Complete this exercise __________ times a day. Squats This exercise strengthens the muscles in the front of your thigh (quadriceps). Stand in a door frame so your feet and knees are in line with the frame. You may place your hands on the frame for balance. Slowly bend your knees and lower your hips like you are going to sit in a  chair. Keep your lower legs in a straight-up-and-down position. Do not let your hips go lower than your knees. Do not bend your knees lower than told by your health care provider. If your hip pain increases, do not bend as low. Hold this position for ___________ seconds. Slowly push with your legs to return to standing. Do not use your hands to pull yourself to standing. Repeat __________ times. Complete this exercise __________ times a day. This information is not intended to replace advice given to you by your health care provider. Make sure you discuss any questions you have with your health care provider. Document Revised: 06/13/2020 Document Reviewed: 06/13/2020 Elsevier Patient Education  [place].
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
## 2021-08-22 ENCOUNTER — Ambulatory Visit: Payer: Self-pay

## 2021-08-22 NOTE — Telephone Encounter (Signed)
  Chief Complaint: medication advice Disposition: '[]'$ ED /'[]'$ Urgent Care (no appt availability in office) / '[]'$ Appointment(In office/virtual)/ '[]'$  [facility]/ '[x]'$ Home Care/ '[]'$ Refused Recommended Disposition /'[]'$  Mobile Bus/ '[]'$  Follow-up with PCP Additional Notes: pt called, advised that Dr. Raliegh Ip recommended pt getting OTC Voltaren gel to use for pain d/t arthritis. Pt states he already called the pharmacy after he called [place] and he understood. Pt didn't need any further assistance.    Summary: discuss medication   Patient states provider prescribed a cream for arthritis at last ov   Patient states pharmacy does not have an rx, patient does not know the name of the medication and inquiring if it was prescribed   Please assist patient further      Reason for Disposition  Caller has medicine question only, adult not sick, AND triager answers question  Answer Assessment - Initial Assessment Questions 1. NAME of MEDICATION: "What medicine are you calling about?"     Voltaren gel  2. QUESTION: "What is your question?" (e.g., double dose of medicine, side effect)     Couldn't remember name of cream for arthritis  3. PRESCRIBING HCP: "Who prescribed it?" Reason: if prescribed by specialist, call should be referred to that group.     Dr. Raliegh Ip  Protocols used: Medication Question Call-A-AH

## 2021-08-26 ENCOUNTER — Telehealth: Payer: Self-pay

## 2021-08-26 NOTE — Telephone Encounter (Signed)
Patient left a voicemail saying he had some questions about the prep

## 2021-08-26 NOTE — Telephone Encounter (Signed)
Returned phone call.  LVM for him to call back.  Thanks, [place], [place]

## 2021-08-27 ENCOUNTER — Telehealth: Payer: Self-pay

## 2021-08-27 NOTE — Telephone Encounter (Signed)
I left a message for the patient to call back and schedule their Medicare Annual Wellness Visit (AWV) virtually, by telephone, or face-to-face.   Chong January, CMA [phone]  

## 2021-08-29 ENCOUNTER — Ambulatory Visit (INDEPENDENT_AMBULATORY_CARE_PROVIDER_SITE_OTHER): Payer: Medicare HMO

## 2021-08-29 DIAGNOSIS — Z0000 Encounter for general adult medical examination without abnormal findings: Secondary | ICD-10-CM

## 2021-08-29 NOTE — Progress Notes (Signed)
Subjective:   Daniel Bradley. is a 66 y.o. male who presents for Medicare Annual/Subsequent preventive examination.  Review of Systems    Per HPI unless specifically indicated below Objective:       08/20/2021    1:24 PM 06/24/2021    2:30 AM 06/24/2021    1:30 AM  Vitals with BMI  Height '6\' 1"'$     Weight 289 lbs 13 oz    BMI 40.10    Systolic 272  536  Diastolic 73  84  Pulse 84 103 99    There were no vitals filed for this visit. There is no height or weight on file to calculate BMI.     06/23/2021    9:30 PM 01/10/2021   11:12 AM 05/23/2019    5:00 PM 05/23/2019   10:23 AM 05/13/2019    9:56 AM 04/03/2019    7:35 AM 09/03/2017    1:40 PM  Advanced Directives  Does Patient Have a Medical Advance Directive? No No No  No No No  Would patient like information on creating a medical advance directive?    No - Patient declined No - Patient declined No - Patient declined Yes (MAU/Ambulatory/Procedural Areas - Information given)    Current Medications (verified) Outpatient Encounter Medications as of 08/29/2021  Medication Sig   aspirin EC 81 MG tablet 81 mg daily. Swallow whole.   atorvastatin (LIPITOR) 80 MG tablet Take 80 mg by mouth daily.   Cholecalciferol 125 MCG (5000 UT) capsule Take by mouth.   diphenhydramine-acetaminophen (TYLENOL PM) 25-500 MG TABS tablet Take 2 tablets by mouth at bedtime as needed.   docusate sodium (COLACE) 100 MG capsule Take 1 tablet once or twice daily as needed for constipation while taking narcotic pain medicine   glipiZIDE (GLUCOTROL) 10 MG tablet TAKE 1 TABLET(10 MG) BY MOUTH TWICE DAILY BEFORE A MEAL   metoprolol tartrate (LOPRESSOR) 25 MG tablet Take 1 tablet (25 mg total) by mouth 2 (two) times daily.   rosuvastatin (CRESTOR) 20 MG tablet Take 1 tablet (20 mg total) by mouth daily.   Semaglutide (RYBELSUS) 3 MG TABS Take 3 mg by mouth daily.   metFORMIN (GLUCOPHAGE) 850 MG tablet Take 1 tablet (850 mg total) by mouth 2 (two) times daily  with a meal. (Patient not taking: Reported on 08/29/2021)   senna (SENOKOT) 8.6 MG tablet Take 2 tablets by mouth daily. (Patient not taking: Reported on 08/29/2021)   [DISCONTINUED] methocarbamol (ROBAXIN) 500 MG tablet Take 1 tablet (500 mg total) by mouth daily as needed for muscle spasms. (Patient not taking: Reported on 08/29/2021)   [DISCONTINUED] tamsulosin (FLOMAX) 0.4 MG CAPS capsule Take 1 tablet by mouth daily until you pass the kidney stone or no longer have symptoms (Patient not taking: Reported on 08/29/2021)   No facility-administered encounter medications on file as of 08/29/2021.    Allergies (verified) Patient has no known allergies.   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   Elevated prostate specific antigen (PSA) 02/21/2013   Hemorrhoid    History of kidney stones    Hyperlipidemia    Hypertension    Incomplete bladder emptying 01/10/2013   Malignant neoplasm of prostate ([place]) 04/28/2013   prostate removed   Myocardial  infarction ([place]) 2019   2 stents placed   Obstructive sleep apnea on CPAP    does not use cpap, does not snore much since loosing 80 lbs   Prostate cancer ([place])    Past Surgical History:  Procedure Laterality Date   COLONOSCOPY     CORONARY STENT INTERVENTION N/A 08/20/2017   Procedure: CORONARY STENT INTERVENTION;  Surgeon: Yolonda Kida, MD;  Location: [facility];  Service: Cardiovascular;  Laterality: N/A;   dental implants     DG KNEE RIGHT COMPLETE ([place] [facility]) Right 05/23/2019   KNEE ARTHROPLASTY Left 05/23/2019   Procedure: COMPUTER ASSISTED TOTAL KNEE ARTHROPLASTY;  Surgeon: Dereck Leep, MD;  Location: [facility];  Service: Orthopedics;  Laterality: Left;   LEFT HEART CATH AND CORONARY ANGIOGRAPHY  N/A 08/20/2017   Procedure: LEFT HEART CATH AND CORONARY ANGIOGRAPHY;  Surgeon: Teodoro Spray, MD;  Location: [facility];  Service: Cardiovascular;  Laterality: N/A;   PROSTATE SURGERY  07/14/2013   Prostatectomy   Family History  Problem Relation Age of Onset   Pancreatic cancer Brother    Alcoholism Mother    Alcoholism Father    Prostate cancer Neg Hx    Kidney disease Neg Hx    Social History   Socioeconomic History   Marital status: Single    Spouse name: Not on file   Number of children: 2   Years of education: Not on file   Highest education level: Not on file  Occupational History   Occupation: [employer]    Employer: [employer]    Comment: retired  Tobacco Use   Smoking status: Never   Smokeless tobacco: Never  Vaping Use   Vaping Use: Never used  Substance and Sexual Activity   Alcohol use: No    Alcohol/week: 0.0 standard drinks of alcohol    Comment: rare   Drug use: No   Sexual activity: Not Currently  Other Topics Concern   Not on file  Social History Narrative   Lives with girlfriend, April   Social Determinants of Health   Financial Resource Strain: Low Risk  (08/29/2021)   Overall Financial Resource Strain (CARDIA)    Difficulty of Paying Living Expenses: Not hard at all  Food Insecurity: No Food Insecurity (08/29/2021)   Hunger Vital Sign    Worried About [place] in the Last Year: Never true    [place] in the Last Year: Never true  Transportation Needs: No Transportation Needs (08/29/2021)   PRAPARE - Hydrologist (Medical): No    Lack of Transportation (Non-Medical): No  Physical Activity: Sufficiently Active (08/29/2021)   Exercise Vital Sign    Days of Exercise per Week: 4 days    Minutes of Exercise per Session: 40 min  Stress: Stress Concern Present (08/29/2021)   [place]    Feeling of Stress : To  some extent  Social Connections: Moderately Isolated (08/29/2021)   Social Connection and Isolation Panel [NHANES]    Frequency of Communication with Friends and Family: More than three times a week    Frequency of Social Gatherings with Friends and Family: Once a week    Attends Religious Services: 1 to 4 times per year    Active Member of [employer] or Organizations: No    Attends Archivist Meetings: Never    Marital Status: Divorced    Tobacco Counseling  Counseling given: Not Answered   Clinical Intake:  Pre-visit preparation completed: No  Pain : No/denies pain     Nutritional Status: BMI 25 -29 Overweight Nutritional Risks: None Diabetes: Yes CBG done?: No Did pt. bring in CBG monitor from home?: No  How often do you need to have someone help you when you read instructions, pamphlets, or other written materials from your doctor or pharmacy?: 1 - Never  Diabetic?Nutrition Risk Assessment:  Has the patient had any N/V/D within the last 2 months?  No  Does the patient have any non-healing wounds?  No  Has the patient had any unintentional weight loss or weight gain?  No   Diabetes:  Is the patient diabetic?  Yes  If diabetic, was a CBG obtained today?  No  Did the patient bring in their glucometer from home?  No  How often do you monitor your CBG's? Never .   Financial Strains and Diabetes Management:  Are you having any financial strains with the device, your supplies or your medication? No .  Does the patient want to be seen by Chronic Care Management for management of their diabetes?  No  Would the patient like to be referred to a Nutritionist or for Diabetic Management?  No   Diabetic Exams:  Diabetic Eye Exam: Completed 11/02/2020 Diabetic Foot Exam: Completed 12/17/2020    Interpreter Needed?: No  Information entered by :: Donnie Mesa, CMA   Activities of Daily Living    08/29/2021    1:31 PM 03/19/2021   11:19 AM  In your present state of  health, do you have any difficulty performing the following activities:  Hearing? 0 0  Vision? 1 1  Difficulty concentrating or making decisions? 1 0  Walking or climbing stairs? 0 1  Dressing or bathing?  0  Doing errands, shopping? 0 0    Patient Care Team: [name], NP as PCP - General (Internal Medicine)  Indicate any recent Medical Services you may have received from other than [facility] providers in the past year (date may be approximate).  [facility] at [facility]21 for Kidney stones and left urteral stone.     Assessment:   This is a routine wellness examination for Irma.  Hearing/Vision screen Denies hearing issues. Annual Eye Exam, Dr. Ellin Mayhew. The pt currently wear glasses.   Dietary issues and exercise activities discussed: Current Exercise Habits: Structured exercise class, Type of exercise: walking, Time (Minutes): 35, Frequency (Times/Week): 4, Weekly Exercise (Minutes/Week): 140, Intensity: Moderate, Exercise limited by: orthopedic condition(s) Exercise- has gym membership walks on treadmill and does weight lifting.     Pt state he try to monitor his sugar and carb intake due to his diabetes.    Goals Addressed   None    Depression Screen    08/29/2021    1:25 PM 03/19/2021   11:19 AM 03/19/2021   11:18 AM 12/17/2020   10:50 AM 11/02/2017    7:57 AM 09/03/2017   11:53 AM 05/12/2016    3:33 PM  PHQ 2/9 Scores  PHQ - 2 Score '4 4 1 '$ 0 0 2 2  PHQ- 9 Score  '12 5  3 6 6    '$ Fall Risk    08/29/2021    1:30 PM 03/19/2021   11:17 AM 09/03/2017   11:53 AM 07/30/2016    9:07 AM 06/11/2016   10:01 AM  Fall Risk   Falls in the past year? 0 0 No No  No  Number falls in past yr: 0 0     Injury with Fall? 0 0     Risk for fall due to : No Fall Risks No Fall Risks     Follow up Falls evaluation completed Falls evaluation completed       [place]:  Any stairs in or around the home? Yes  If so, are there  any without handrails? Yes  Home free of loose throw rugs in walkways, pet beds, electrical cords, etc? Yes  Adequate lighting in your home to reduce risk of falls? Yes   ASSISTIVE DEVICES UTILIZED TO PREVENT FALLS:  Life alert? No  Use of a cane, walker or w/c? No  Grab bars in the bathroom? No  Shower chair or bench in shower? No  Elevated toilet seat or a handicapped toilet? No   TIMED UP AND GO:  Was the test performed? No .  Unable to perform, virtual appt.   Cognitive Function:        08/29/2021    1:32 PM  6CIT Screen  What Year? 0 points  What month? 0 points  What time? 0 points  Count back from 20 0 points  Months in reverse 0 points  Repeat phrase 10 points  Total Score 10 points    Immunizations Immunization History  Administered Date(s) Administered   Influenza,inj,Quad PF,6+ Mos 11/17/2018, 12/30/2019   Influenza-Unspecified 11/25/2011, 12/07/2014, 12/17/2020   Moderna Sars-Covid-2 Vaccination 02/16/2020   PFIZER(Purple Top)SARS-COV-2 Vaccination 07/21/2019, 08/11/2019   PNEUMOCOCCAL CONJUGATE-20 12/17/2020   Tdap 02/10/2010    TDAP status: Due, Education has been provided regarding the importance of this vaccine. Advised may receive this vaccine at local pharmacy or Health Dept. Aware to provide a copy of the vaccination record if obtained from local pharmacy or Health Dept. Verbalized acceptance and understanding.  Flu Vaccine status: Up to date  Pneumococcal vaccine status: Up to date  Covid-19 vaccine status: Completed vaccines  Qualifies for Shingles Vaccine? Yes   Zostavax completed No   Shingrix Completed?: No.    Education has been provided regarding the importance of this vaccine. Patient has been advised to call insurance company to determine out of pocket expense if they have not yet received this vaccine. Advised may also receive vaccine at local pharmacy or Health Dept. Verbalized acceptance and understanding.  Screening Tests Health  Maintenance  Topic Date Due   Hepatitis C Screening  Never done   COLONOSCOPY (Pts 45-10yr Insurance coverage will need to be confirmed)  Never done   Zoster Vaccines- Shingrix (1 of 2) Never done   TETANUS/TDAP  02/11/2020   COVID-19 Vaccine (4 - Pfizer series) 10/11/2021 (Originally 04/12/2020)   INFLUENZA VACCINE  09/10/2021   OPHTHALMOLOGY EXAM  11/02/2021   FOOT EXAM  12/17/2021   URINE MICROALBUMIN  12/17/2021   HEMOGLOBIN A1C  12/21/2021   Pneumonia Vaccine 66 Years old  Completed   HIV Screening  Completed   HPV VACCINES  Aged Out    Health Maintenance  Health Maintenance Due  Topic Date Due   Hepatitis C Screening  Never done   COLONOSCOPY (Pts 45-422yrInsurance coverage will need to be confirmed)  Never done   Zoster Vaccines- Shingrix (1 of 2) Never done   TETANUS/TDAP  02/11/2020    Colorectal screening: Colonoscopy scheduled with AnJonathon BellowsMD on 09/11/2021  Lung Cancer Screening: (Low Dose CT Chest recommended if Age 742-80ears, 30 pack-year currently smoking OR have  quit w/in 15years.) does not qualify.   Lung Cancer Screening Referral: does not qualify   Additional Screening:  Hepatitis C Screening: does qualify  Vision Screening: Recommended annual ophthalmology exams for early detection of glaucoma and other disorders of the eye. Is the patient up to date with their annual eye exam?  Yes  Who is the provider or what is the name of the office in which the patient attends annual eye exams? Dr. Ellin Mayhew  If pt is not established with a provider, would they like to be referred to a provider to establish care? No .   Dental Screening: Recommended annual dental exams for proper oral hygiene  Community Resource Referral / Chronic Care Management: CRR required this visit?  No   CCM required this visit?  No      Plan:     I have personally reviewed and noted the following in the patient's chart:   Medical and social history Use of alcohol, tobacco or  illicit drugs  Current medications and supplements including opioid prescriptions. Patient is not currently taking opioid prescriptions. Functional ability and status Nutritional status Physical activity Advanced directives List of other physicians Hospitalizations, surgeries, and ER visits in previous 12 months Vitals Screenings to include cognitive, depression, and falls Referrals and appointments  In addition, I have reviewed and discussed with patient certain preventive protocols, quality metrics, and best practice recommendations. A written personalized care plan for preventive services as well as general preventive health recommendations were provided to patient.   Mr. Maners , Thank you for taking time to come for your Medicare Wellness Visit. I appreciate your ongoing commitment to your health goals. Please review the following plan we discussed and let me know if I can assist you in the future.   These are the goals we discussed:  Goals      Exercise 150 min/wk Moderate Activity      Pt state he would like to get his health back to where is was before the heart attack.         This is a list of the screening recommended for you and due dates:  Health Maintenance  Topic Date Due   Hepatitis C Screening: USPSTF Recommendation to screen - Ages 37-79 yo.  Never done   Colon Cancer Screening  Never done   Zoster (Shingles) Vaccine (1 of 2) Never done   Tetanus Vaccine  02/11/2020   COVID-19 Vaccine (4 - Pfizer series) 10/11/2021*   Flu Shot  09/10/2021   Eye exam for diabetics  11/02/2021   Complete foot exam   12/17/2021   Urine Protein Check  12/17/2021   Hemoglobin A1C  12/21/2021   Pneumonia Vaccine  Completed   HIV Screening  Completed   HPV Vaccine  Aged Out  *Topic was postponed. The date shown is not the original due date.      Wilson Singer, [place]   08/29/2021   Nurse Notes: None

## 2021-09-10 MED ORDER — CLENPIQ 10-3.5-12 MG-GM -GM/160ML PO SOLN: ORAL | 0 refills | Status: DC

## 2021-09-10 NOTE — Addendum Note (Signed)
Addended by: Wayna Chalet on: 09/10/2021 10:44 AM   Modules accepted: Orders

## 2021-09-11 ENCOUNTER — Other Ambulatory Visit: Payer: Self-pay | Admitting: Internal Medicine

## 2021-09-11 ENCOUNTER — Encounter: Admission: RE | Payer: Self-pay | Source: Home / Self Care

## 2021-09-11 ENCOUNTER — Ambulatory Visit: Admission: RE | Admit: 2021-09-11 | Payer: Medicare HMO | Source: Home / Self Care | Admitting: Gastroenterology

## 2021-09-11 SURGERY — COLONOSCOPY WITH PROPOFOL
Anesthesia: General

## 2021-09-12 NOTE — Telephone Encounter (Signed)
Requested Prescriptions  Pending Prescriptions Disp Refills  . glipiZIDE (GLUCOTROL) 10 MG tablet [Pharmacy Med Name: GLIPIZIDE '10MG'$  TABLETS] 180 tablet 0    Sig: TAKE 1 TABLET(10 MG) BY MOUTH TWICE DAILY BEFORE A MEAL     Endocrinology:  Diabetes - Sulfonylureas Failed - 09/11/2021 12:54 PM      Failed - HBA1C is between 0 and 7.9 and within 180 days    Hemoglobin A1C  Date Value Ref Range Status  06/20/2021 10.4 (A) 4.0 - 5.6 % Final   Hgb A1c MFr Bld  Date Value Ref Range Status  12/17/2020 8.2 (H) <5.7 % of total Hgb Final    Comment:    For someone without known diabetes, a hemoglobin A1c value of 6.5% or greater indicates that they may have  diabetes and this should be confirmed with a follow-up  test. . For someone with known diabetes, a value <7% indicates  that their diabetes is well controlled and a value  greater than or equal to 7% indicates suboptimal  control. A1c targets should be individualized based on  duration of diabetes, age, comorbid conditions, and  other considerations. . Currently, no consensus exists regarding use of hemoglobin A1c for diagnosis of diabetes for children. .          Passed - Cr in normal range and within 360 days    Creat  Date Value Ref Range Status  06/20/2021 0.95 0.70 - 1.35 mg/dL Final   Creatinine, Ser  Date Value Ref Range Status  06/23/2021 1.24 0.61 - 1.24 mg/dL Final   Creatinine, Urine  Date Value Ref Range Status  12/17/2020 265 20 - 320 mg/dL Final         Passed - Valid encounter within last 6 months    Recent Outpatient Visits          3 weeks ago Right groin pain   [place], DO   2 months ago Screening for colon cancer   [facility] [place], Coralie Keens, NP   5 months ago Type 2 diabetes mellitus with other specified complication, without long-term current use of insulin [facility])   [facility] [place], Coralie Keens, NP   8 months ago Encounter  for general adult medical examination with abnormal findings   [facility], Coralie Keens, NP      Future Appointments            In 2 weeks Garnette Gunner, Coralie Keens, NP [facility], [place]   In 2 months Hollice Espy, [place]

## 2021-09-18 ENCOUNTER — Other Ambulatory Visit: Payer: Self-pay | Admitting: Internal Medicine

## 2021-09-18 NOTE — Telephone Encounter (Signed)
Requested medications are due for refill today.  yes  Requested medications are on the active medications list.  yes  Last refill. 06/20/2021 #90 0 refills  Future visit scheduled.   yes  Notes to clinic.  No protocol assigned for refill. Please review for refill.    Requested Prescriptions  Pending Prescriptions Disp Refills   RYBELSUS 3 MG TABS [Pharmacy Med Name: YJEHUDJS '3MG'$  TABLETS] 90 tablet 0    Sig: TAKE 1 TABLET BY MOUTH DAILY     Off-Protocol Failed - 09/18/2021  2:49 PM      Failed - Medication not assigned to a protocol, review manually.      Passed - Valid encounter within last 12 months    Recent Outpatient Visits           4 weeks ago Right groin pain   [place], DO   3 months ago Screening for colon cancer   [facility] [place], Coralie Keens, NP   6 months ago Type 2 diabetes mellitus with other specified complication, without long-term current use of insulin ([place])   [facility], Coralie Keens, NP   9 months ago Encounter for general adult medical examination with abnormal findings   [facility], Coralie Keens, NP       Future Appointments             In 1 week Daniel Fenton, NP [facility], [place]   In 2 months Hollice Espy, MD [place]            Refused Prescriptions Disp Refills   rosuvastatin (CRESTOR) 10 MG tablet [Pharmacy Med Name: ROSUVASTATIN '10MG'$  TABLETS] 90 tablet 0    Sig: TAKE 1 TABLET(10 MG) BY MOUTH DAILY     Cardiovascular:  Antilipid - Statins 2 Failed - 09/18/2021  2:49 PM      Failed - Lipid Panel in normal range within the last 12 months    Cholesterol, Total  Date Value Ref Range Status  03/30/2020 128 100 - 199 mg/dL Final   Cholesterol  Date Value Ref Range Status  06/20/2021 198 <200 mg/dL Final   LDL Cholesterol (Calc)  Date Value Ref Range Status  06/20/2021 118 (H) mg/dL (calc) Final     Comment:    Reference range: <100 . Desirable range <100 mg/dL for primary prevention;   <70 mg/dL for patients with CHD or diabetic patients  with > or = 2 CHD risk factors. Marland Kitchen LDL-C is now calculated using the Martin-Hopkins  calculation, which is a validated novel method providing  better accuracy than the Friedewald equation in the  estimation of LDL-C.  Cresenciano Genre et al. Annamaria Helling. 9702;637(85): 2061-2068  (http://education.[employer].com/faq/FAQ164)    HDL  Date Value Ref Range Status  06/20/2021 51 > OR = 40 mg/dL Final  03/30/2020 41 >39 mg/dL Final   Triglycerides  Date Value Ref Range Status  06/20/2021 177 (H) <150 mg/dL Final         Passed - Cr in normal range and within 360 days    Creat  Date Value Ref Range Status  06/20/2021 0.95 0.70 - 1.35 mg/dL Final   Creatinine, Ser  Date Value Ref Range Status  06/23/2021 1.24 0.61 - 1.24 mg/dL Final   Creatinine, Urine  Date Value Ref Range Status  12/17/2020 265 20 - 320 mg/dL Final         Passed - Patient  is not pregnant      Passed - Valid encounter within last 12 months    Recent Outpatient Visits           4 weeks ago Right groin pain   [place], DO   3 months ago Screening for colon cancer   [facility] [place], [place], NP   6 months ago Type 2 diabetes mellitus with other specified complication, without long-term current use of insulin [facility])   [facility], Coralie Keens, NP   9 months ago Encounter for general adult medical examination with abnormal findings   [facility], Coralie Keens, NP       Future Appointments             In 1 week Daniel Bradley, Coralie Keens, NP [facility], [place]   In 2 months Hollice Espy, [place]

## 2021-09-18 NOTE — Telephone Encounter (Signed)
Dosage changed to '20mg'$ . Requested Prescriptions  Pending Prescriptions Disp Refills  . rosuvastatin (CRESTOR) 10 MG tablet [Pharmacy Med Name: ROSUVASTATIN '10MG'$  TABLETS] 90 tablet 0    Sig: TAKE 1 TABLET(10 MG) BY MOUTH DAILY     Cardiovascular:  Antilipid - Statins 2 Failed - 09/18/2021  2:49 PM      Failed - Lipid Panel in normal range within the last 12 months    Cholesterol, Total  Date Value Ref Range Status  03/30/2020 128 100 - 199 mg/dL Final   Cholesterol  Date Value Ref Range Status  06/20/2021 198 <200 mg/dL Final   LDL Cholesterol (Calc)  Date Value Ref Range Status  06/20/2021 118 (H) mg/dL (calc) Final    Comment:    Reference range: <100 . Desirable range <100 mg/dL for primary prevention;   <70 mg/dL for patients with CHD or diabetic patients  with > or = 2 CHD risk factors. Marland Kitchen LDL-C is now calculated using the Martin-Hopkins  calculation, which is a validated novel method providing  better accuracy than the Friedewald equation in the  estimation of LDL-C.  Cresenciano Genre et al. Annamaria Helling. 1751;025(85): 2061-2068  (http://education.[facility].com/faq/FAQ164)    HDL  Date Value Ref Range Status  06/20/2021 51 > OR = 40 mg/dL Final  03/30/2020 41 >39 mg/dL Final   Triglycerides  Date Value Ref Range Status  06/20/2021 177 (H) <150 mg/dL Final         Passed - Cr in normal range and within 360 days    Creat  Date Value Ref Range Status  06/20/2021 0.95 0.70 - 1.35 mg/dL Final   Creatinine, Ser  Date Value Ref Range Status  06/23/2021 1.24 0.61 - 1.24 mg/dL Final   Creatinine, Urine  Date Value Ref Range Status  12/17/2020 265 20 - 320 mg/dL Final         Passed - Patient is not pregnant      Passed - Valid encounter within last 12 months    Recent Outpatient Visits          4 weeks ago Right groin pain   [place], DO   3 months ago Screening for colon cancer   [facility] [place],  Coralie Keens, NP   6 months ago Type 2 diabetes mellitus with other specified complication, without long-term current use of insulin [facility])   [facility] [place], Coralie Keens, NP   9 months ago Encounter for general adult medical examination with abnormal findings   [facility], Coralie Keens, NP      Future Appointments            In 1 week Garnette Gunner, Coralie Keens, NP [facility], [place]   In 2 months Hollice Espy, MD [place]           . RYBELSUS 3 MG TABS [Pharmacy Med Name: IDPOEUMP '3MG'$  TABLETS] 90 tablet 0    Sig: TAKE 1 TABLET BY MOUTH DAILY     Off-Protocol Failed - 09/18/2021  2:49 PM      Failed - Medication not assigned to a protocol, review manually.      Passed - Valid encounter within last 12 months    Recent Outpatient Visits          4 weeks ago Right groin pain   [place], DO   3 months ago Screening for colon cancer  [facility] [place], [place], NP   6 months ago Type 2 diabetes mellitus with other specified complication, without long-term current use of insulin [facility])   [facility] [place], Coralie Keens, NP   9 months ago Encounter for general adult medical examination with abnormal findings   [facility], Coralie Keens, NP      Future Appointments            In 1 week Garnette Gunner, Coralie Keens, NP [facility], [place]   In 2 months Hollice Espy, [place]

## 2021-09-23 ENCOUNTER — Other Ambulatory Visit: Payer: Self-pay | Admitting: Internal Medicine

## 2021-09-24 NOTE — Telephone Encounter (Signed)
Requested medications are due for refill today.  unsure  Requested medications are on the active medications list.  yes  Last refill. 06/21/2021 #90 0 refills  Future visit scheduled.   yes  Notes to clinic.  There are 2 statins on med list.     Requested Prescriptions  Pending Prescriptions Disp Refills   rosuvastatin (CRESTOR) 20 MG tablet [Pharmacy Med Name: ROSUVASTATIN '20MG'$  TABLETS] 90 tablet 2    Sig: TAKE 1 TABLET(20 MG) BY MOUTH DAILY     Cardiovascular:  Antilipid - Statins 2 Failed - 09/23/2021 10:54 AM      Failed - Lipid Panel in normal range within the last 12 months    Cholesterol, Total  Date Value Ref Range Status  03/30/2020 128 100 - 199 mg/dL Final   Cholesterol  Date Value Ref Range Status  06/20/2021 198 <200 mg/dL Final   LDL Cholesterol (Calc)  Date Value Ref Range Status  06/20/2021 118 (H) mg/dL (calc) Final    Comment:    Reference range: <100 . Desirable range <100 mg/dL for primary prevention;   <70 mg/dL for patients with CHD or diabetic patients  with > or = 2 CHD risk factors. Marland Kitchen LDL-C is now calculated using the Martin-Hopkins  calculation, which is a validated novel method providing  better accuracy than the Friedewald equation in the  estimation of LDL-C.  Cresenciano Genre et al. Annamaria Helling. 5009;381(82): 2061-2068  (http://education.[facility].com/faq/FAQ164)    HDL  Date Value Ref Range Status  06/20/2021 51 > OR = 40 mg/dL Final  03/30/2020 41 >39 mg/dL Final   Triglycerides  Date Value Ref Range Status  06/20/2021 177 (H) <150 mg/dL Final         Passed - Cr in normal range and within 360 days    Creat  Date Value Ref Range Status  06/20/2021 0.95 0.70 - 1.35 mg/dL Final   Creatinine, Ser  Date Value Ref Range Status  06/23/2021 1.24 0.61 - 1.24 mg/dL Final   Creatinine, Urine  Date Value Ref Range Status  12/17/2020 265 20 - 320 mg/dL Final         Passed - Patient is not pregnant      Passed - Valid encounter  within last 12 months    Recent Outpatient Visits           1 month ago Right groin pain   [place], DO   3 months ago Screening for colon cancer   [facility] [place], Coralie Keens, NP   6 months ago Type 2 diabetes mellitus with other specified complication, without long-term current use of insulin [facility])   [facility] [place], Coralie Keens, NP   9 months ago Encounter for general adult medical examination with abnormal findings   [facility], Coralie Keens, NP       Future Appointments             In 1 week Garnette Gunner, Coralie Keens, NP [facility], [place]   In 1 month Hollice Espy, [place]

## 2021-10-01 ENCOUNTER — Ambulatory Visit (INDEPENDENT_AMBULATORY_CARE_PROVIDER_SITE_OTHER): Payer: Medicare HMO | Admitting: Internal Medicine

## 2021-10-01 ENCOUNTER — Encounter: Payer: Self-pay | Admitting: Internal Medicine

## 2021-10-01 VITALS — BP 136/80 | HR 70 | Temp 97.1°F | Wt 289.0 lb

## 2021-10-01 DIAGNOSIS — I214 Non-ST elevation (NSTEMI) myocardial infarction: Secondary | ICD-10-CM | POA: Diagnosis not present

## 2021-10-01 DIAGNOSIS — M1611 Unilateral primary osteoarthritis, right hip: Secondary | ICD-10-CM

## 2021-10-01 DIAGNOSIS — I10 Essential (primary) hypertension: Secondary | ICD-10-CM | POA: Diagnosis not present

## 2021-10-01 DIAGNOSIS — E1169 Type 2 diabetes mellitus with other specified complication: Secondary | ICD-10-CM

## 2021-10-01 DIAGNOSIS — E7849 Other hyperlipidemia: Secondary | ICD-10-CM | POA: Diagnosis not present

## 2021-10-01 DIAGNOSIS — E119 Type 2 diabetes mellitus without complications: Secondary | ICD-10-CM

## 2021-10-01 DIAGNOSIS — Z6838 Body mass index (BMI) 38.0-38.9, adult: Secondary | ICD-10-CM

## 2021-10-01 DIAGNOSIS — I700 Atherosclerosis of aorta: Secondary | ICD-10-CM | POA: Diagnosis not present

## 2021-10-01 DIAGNOSIS — G4733 Obstructive sleep apnea (adult) (pediatric): Secondary | ICD-10-CM

## 2021-10-01 DIAGNOSIS — I25810 Atherosclerosis of coronary artery bypass graft(s) without angina pectoris: Secondary | ICD-10-CM

## 2021-10-01 LAB — POCT GLYCOSYLATED HEMOGLOBIN (HGB A1C): Hemoglobin A1C: 10.9 % — AB (ref 4.0–5.6)

## 2021-10-01 MED ORDER — GLIPIZIDE 10 MG PO TABS: ORAL_TABLET | ORAL | 0 refills | Status: DC

## 2021-10-01 MED ORDER — METFORMIN HCL 850 MG PO TABS: 850.0000 mg | ORAL_TABLET | Freq: Two times a day (BID) | ORAL | 0 refills | Status: DC

## 2021-10-01 NOTE — Assessment & Plan Note (Signed)
C-Met and lipid profile today Encouraged him to consume a low-fat diet Continue rosuvastatin, metoprolol and aspirin

## 2021-10-01 NOTE — Assessment & Plan Note (Signed)
Encourage weight loss as this can help reduce sleep apnea symptoms Noncompliant with CPAP 

## 2021-10-01 NOTE — Assessment & Plan Note (Signed)
He declines referral for physical therapy at this time Encourage weight loss as this can help reduce joint pain

## 2021-10-01 NOTE — Progress Notes (Signed)
Subjective:    Patient ID: Daniel Bradley., male    DOB: 07/07/1955, 66 y.o.   MRN: [id number]  HPI  Patient presents to clinic today for 79-monthfollow-up of chronic conditions.  HTN: His BP today is 136/80.  He is taking Metoprolol as prescribed.  ECG from 03/2020 reviewed.   HLD with CAD, Aortic Atherosclerosis status post MI: His last LDL was 118, triglycerides 177, 06/2021.  He denies myalgias on Rosuvastatin.  He is taking Metoprolol and Aspirin as well.  He tries to consume a low-fat diet. He follows with cardiology.  DM2: His last A1c was 10.4%, 06/2021.  He is not taking Metformin, Glipizide but is taking Rybelsus as prescribed.  He does not check his sugars.  He checks his feet routinely.  His last [facility] exam was 10/2020-[facility]  Flu 12/2020.  Pneumovax never.  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  Ge takes Unisom as needed. Sleep study from 09/2017 reviewed.  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 Swallow whole.     atorvastatin (LIPITOR) 80 MG tablet Take 80 mg by mouth daily.     Cholecalciferol 125 MCG (5000 UT) capsule Take by mouth.      diphenhydramine-acetaminophen (TYLENOL PM) 25-500 MG TABS tablet Take 2 tablets by mouth at bedtime as needed.     docusate sodium (COLACE) 100 MG capsule Take 1 tablet once or twice daily as needed for constipation while taking narcotic pain medicine 30 capsule 0   glipiZIDE (GLUCOTROL) 10 MG tablet TAKE 1 TABLET(10 MG) BY MOUTH TWICE DAILY BEFORE A MEAL 180 tablet 0   metFORMIN (GLUCOPHAGE) 850 MG tablet Take 1 tablet (850 mg total) by mouth 2 (two) times daily with a meal. (Patient not taking: Reported on 08/29/2021) 180 tablet 2   metoprolol tartrate (LOPRESSOR) 25 MG tablet Take 1 tablet (25 mg total) by mouth 2 (two) times daily. 60 tablet 2   rosuvastatin (CRESTOR) 20 MG tablet TAKE 1 TABLET(20 MG) BY MOUTH DAILY 90 tablet 0   RYBELSUS 3 MG TABS TAKE 1 TABLET BY MOUTH DAILY 90 tablet 0   senna (SENOKOT) 8.6 MG tablet Take 2 tablets by mouth daily. (Patient not taking: Reported on 08/29/2021)     Sod Picosulfate-Mag Ox-Cit Acd (CLENPIQ) 10-3.5-12 MG-GM -GM/160ML SOLN Take 1 bottle at 5 PM followed by five 8 oz cups of water and repeat 5 hours before procedure. 320 mL 0   No current facility-administered medications for this visit.    No Known Allergies  Family  History  Problem Relation Age of Onset   Pancreatic cancer Brother    Alcoholism Mother    Alcoholism Father    Prostate cancer Neg Hx    Kidney disease Neg Hx     Social History   Socioeconomic History   Marital status: Single    Spouse name: Not on file   Number of children: 2   Years of education: Not on file   Highest education level: Not on file  Occupational History   Occupation: [employer]AT[employer]    Employer: [employer]    Comment: retired  Tobacco Use   Smoking status: Never   Smokeless tobacco: Never  Vaping Use   Vaping Use: Never used  Substance and Sexual Activity   Alcohol use: No    Alcohol/week: 0.0 standard drinks of alcohol    Comment: rare   Drug use: No   Sexual activity: Not Currently   Other Topics Concern   Not on file  Social History Narrative   Lives with girlfriend, April   Social Determinants of Health   Financial Resource Strain: Low Risk  (08/29/2021)   Overall Financial Resource Strain (CARDIA)    Difficulty of Paying Living Expenses: Not hard at all  Food Insecurity: No Food Insecurity (08/29/2021)   Hunger Vital Sign    Worried About [place] in the Last Year: Never true    [place] in the Last Year: Never true  Transportation Needs: No Transportation Needs (08/29/2021)   PRAPARE - Hydrologist (Medical): No    Lack of Transportation (Non-Medical): No  Physical Activity: Sufficiently Active (08/29/2021)   Exercise Vital Sign    Days of Exercise per Week: 4 days    Minutes of Exercise per Session: 40 min  Stress: Stress Concern Present (08/29/2021)   [place]    Feeling of Stress : To some extent  Social Connections: Moderately Isolated (08/29/2021)   Social Connection and Isolation Panel [NHANES]    Frequency of Communication with Friends and Family: More than three times a week    Frequency of Social Gatherings with Friends and Family: Once a week    Attends Religious Services: 1 to 4 times per year    Active Member of [employer] or Organizations: No    Attends Archivist Meetings: Never    Marital Status: Divorced  Human resources officer Violence: Not on file     Constitutional: Denies fever, malaise, fatigue, headache or abrupt weight changes.  HEENT: Denies eye pain, eye redness, ear pain, ringing in the ears, wax buildup, runny nose, nasal congestion, bloody nose, or sore throat. Respiratory: Denies difficulty breathing, shortness of breath, cough or sputum production.   Cardiovascular: Denies chest pain, chest tightness, palpitations or swelling in the hands or feet.  Gastrointestinal: Denies abdominal pain, bloating, constipation,  diarrhea or blood in the stool.  GU: Denies urgency, frequency, pain with urination, burning sensation, blood in urine, odor or discharge. Musculoskeletal: Patient reports chronic low back pain, decreased range of motion of right hip.  Denies difficulty with gait, or joint swelling.  Skin: Denies redness, rashes, lesions or ulcercations.  Neurological: Patient reports intermittent numbness in his feet.  Denies dizziness, difficulty with memory, difficulty with speech or problems with balance and coordination.  Psych: Denies anxiety, depression, SI/HI.  No other specific complaints in a complete review of systems (except as listed in HPI above).  Objective:   Physical Exam  BP 136/80 (BP Location: Left Arm, Patient Position: Sitting, Cuff Size: Large)   Pulse 70   Temp (!) 97.1 F (36.2 C) (Temporal)   Wt 289 lb (131.1 kg)   SpO2 98%   BMI 38.13 kg/m   Wt Readings from Last 3 Encounters:  08/20/21 289 lb 12.8 oz (131.5 kg)  06/23/21 290 lb (131.5 kg)  06/20/21 294 lb (133.4 kg)    General: Appears his stated age, IV, in NAD. Skin: Warm, dry and intact. No ulcerations noted. HEENT: Head: normal shape and size; Eyes: sclera white, no icterus, conjunctiva pink, PERRLA and EOMs intact;  Cardiovascular: Normal rate and rhythm. S1,S2 noted.  No murmur, rubs or gallops noted. No JVD or BLE edema. No carotid bruits noted. Pulmonary/Chest: Normal effort and positive vesicular breath sounds. No respiratory distress. No wheezes, rales or ronchi noted.  Musculoskeletal: Decreased internal and external rotation of the right hip.  No pain with palpation of the right hip.  No difficulty with gait.  Neurological: Alert and oriented.  Sensation intact to BLE.    BMET    Component Value Date/Time   NA 138 06/23/2021 2133   NA 139 03/30/2020 1015   NA 139 04/21/2012 0641   K 4.1 06/23/2021 2133   K 3.8 04/21/2012 0641   CL 106 06/23/2021 2133   CL 107 04/21/2012 0641   CO2 21 (L)  06/23/2021 2133   CO2 27 04/21/2012 0641   GLUCOSE 313 (H) 06/23/2021 2133   GLUCOSE 212 (H) 04/21/2012 0641   BUN 19 06/23/2021 2133   BUN 14 03/30/2020 1015   BUN 22 (H) 04/21/2012 0641   CREATININE 1.24 06/23/2021 2133   CREATININE 0.95 06/20/2021 1520   CALCIUM 9.3 06/23/2021 2133   CALCIUM 8.7 04/21/2012 0641   GFRNONAA >60 06/23/2021 2133   GFRNONAA 51 (L) 04/21/2012 0641   GFRAA 100 03/30/2020 1015   GFRAA 59 (L) 04/21/2012 0641    Lipid Panel     Component Value Date/Time   CHOL 198 06/20/2021 1520   CHOL 128 03/30/2020 1015   TRIG 177 (H) 06/20/2021 1520   HDL 51 06/20/2021 1520   HDL 41 03/30/2020 1015   CHOLHDL 3.9 06/20/2021 1520   VLDL 14 08/20/2017 0359   LDLCALC 118 (H) 06/20/2021 1520    CBC    Component Value Date/Time   WBC 9.6 06/23/2021 2133   RBC 5.52 06/23/2021 2133   HGB 13.7 06/23/2021 2133   HGB 14.0 03/30/2020 1015   HCT 44.2 06/23/2021 2133   HCT 42.5 03/30/2020 1015   PLT 289 06/23/2021 2133   PLT 306 03/30/2020 1015   MCV 80.1 06/23/2021 2133   MCV 79 03/30/2020 1015   MCV 80 04/21/2012 0641   MCH 24.8 (L) 06/23/2021 2133   MCHC 31.0 06/23/2021 2133   RDW 14.4 06/23/2021 2133   RDW 14.6 03/30/2020 1015   RDW 14.9 (H) 04/21/2012 0641   LYMPHSABS 1.8 03/30/2020 1015   MONOABS 0.5 04/03/2019 0757   EOSABS 0.3 03/30/2020 1015   BASOSABS 0.1 03/30/2020 1015    Hgb A1C Lab Results  Component Value Date   HGBA1C 10.4 (A) 06/20/2021           Assessment & Plan:    RTC in 3 months for your annual exam Webb Silversmith, NP

## 2021-10-01 NOTE — Assessment & Plan Note (Signed)
Encouraged diet and exercise for weight loss ?

## 2021-10-01 NOTE — Assessment & Plan Note (Signed)
Controlled on metoprolol Reinforced DASH diet and exercise for weight loss C-Met today

## 2021-10-01 NOTE — Patient Instructions (Signed)

## 2021-10-01 NOTE — Assessment & Plan Note (Signed)
POCT A1c 10.9% Urine microalbumin will be checked at his next visit Encouraged him to take metformin and glipizide in addition to his Rybelsus.  Advised him that the Rybelsus did not replace his other medications.  Metformin and glipizide refilled today Reinforced low-carb diet and exercise for weight loss We will request copy of eye exam Encourage routine foot exam Advised him to get a flu shot in the fall Prevnar UTD Encouraged him to get his COVID booster

## 2021-10-07 ENCOUNTER — Other Ambulatory Visit: Payer: Self-pay

## 2021-10-07 DIAGNOSIS — Z1211 Encounter for screening for malignant neoplasm of colon: Secondary | ICD-10-CM

## 2021-10-07 MED ORDER — PEG 3350-KCL-NA BICARB-NACL 420 G PO SOLR: 4000.0000 mL | Freq: Once | ORAL | 0 refills | Status: AC

## 2021-10-09 ENCOUNTER — Encounter
Admission: RE | Disposition: A | Discharge status: Home / Self Care | Payer: Self-pay | Source: Home / Self Care | Attending: Gastroenterology

## 2021-10-09 ENCOUNTER — Ambulatory Visit
Admission: RE | Admit: 2021-10-09 | Discharge: 2021-10-09 | Disposition: A | Discharge status: Home / Self Care | Payer: Medicare HMO | Attending: Gastroenterology | Admitting: Gastroenterology

## 2021-10-09 ENCOUNTER — Ambulatory Visit: Payer: Medicare HMO | Admitting: Anesthesiology

## 2021-10-09 DIAGNOSIS — Z8546 Personal history of malignant neoplasm of prostate: Secondary | ICD-10-CM | POA: Insufficient documentation

## 2021-10-09 DIAGNOSIS — E119 Type 2 diabetes mellitus without complications: Secondary | ICD-10-CM | POA: Diagnosis not present

## 2021-10-09 DIAGNOSIS — Z1211 Encounter for screening for malignant neoplasm of colon: Secondary | ICD-10-CM | POA: Insufficient documentation

## 2021-10-09 DIAGNOSIS — K644 Residual hemorrhoidal skin tags: Secondary | ICD-10-CM | POA: Insufficient documentation

## 2021-10-09 DIAGNOSIS — E669 Obesity, unspecified: Secondary | ICD-10-CM | POA: Insufficient documentation

## 2021-10-09 DIAGNOSIS — Z6838 Body mass index (BMI) 38.0-38.9, adult: Secondary | ICD-10-CM | POA: Diagnosis not present

## 2021-10-09 DIAGNOSIS — Z7984 Long term (current) use of oral hypoglycemic drugs: Secondary | ICD-10-CM | POA: Diagnosis not present

## 2021-10-09 DIAGNOSIS — G4733 Obstructive sleep apnea (adult) (pediatric): Secondary | ICD-10-CM | POA: Insufficient documentation

## 2021-10-09 DIAGNOSIS — I252 Old myocardial infarction: Secondary | ICD-10-CM | POA: Diagnosis not present

## 2021-10-09 DIAGNOSIS — I2510 Atherosclerotic heart disease of native coronary artery without angina pectoris: Secondary | ICD-10-CM | POA: Diagnosis not present

## 2021-10-09 DIAGNOSIS — I10 Essential (primary) hypertension: Secondary | ICD-10-CM | POA: Insufficient documentation

## 2021-10-09 HISTORY — PX: COLONOSCOPY WITH PROPOFOL: SHX5780

## 2021-10-09 LAB — GLUCOSE, CAPILLARY: Glucose-Capillary: 177 mg/dL — ABNORMAL HIGH (ref 70–99)

## 2021-10-09 SURGERY — COLONOSCOPY WITH PROPOFOL
Anesthesia: General

## 2021-10-09 MED ORDER — SODIUM CHLORIDE 0.9 % IV SOLN
INTRAVENOUS | Status: DC
  Administered 2021-10-09: 20 mL/h via INTRAVENOUS

## 2021-10-09 MED ORDER — PROPOFOL 500 MG/50ML IV EMUL
INTRAVENOUS | Status: DC | PRN
  Administered 2021-10-09: 165 ug/kg/min via INTRAVENOUS

## 2021-10-09 MED ORDER — LIDOCAINE HCL (CARDIAC) PF 100 MG/5ML IV SOSY
PREFILLED_SYRINGE | INTRAVENOUS | Status: DC | PRN
  Administered 2021-10-09: 50 mg via INTRAVENOUS

## 2021-10-09 MED ORDER — PROPOFOL 10 MG/ML IV BOLUS
INTRAVENOUS | Status: DC | PRN
  Administered 2021-10-09: 80 mg via INTRAVENOUS

## 2021-10-09 NOTE — Anesthesia Procedure Notes (Signed)
Date/Time: 10/09/2021 10:51 AM  Performed by: Loletha Grayer, CRNAPre-anesthesia Checklist: Patient identified, Emergency Drugs available, Suction available, Patient being monitored and Timeout performed Patient Re-evaluated:Patient Re-evaluated prior to induction Oxygen Delivery Method: Simple face mask

## 2021-10-09 NOTE — Op Note (Signed)
[name] Gastroenterology Patient Name: Daniel Bradley Procedure Date: 10/09/2021 10:48 AM MRN: [id number] Account #: [id number] Date of Birth: 1955-08-15 Admit Type: Outpatient Age: 66 Room: [facility] ENDO ROOM 4 Gender: Male Note Status: Finalized Instrument Name: Colonoscope [id number] Procedure:             Colonoscopy Indications:           Screening for colorectal malignant neoplasm, Last                         colonoscopy: July 2012 Providers:             Lin Landsman MD, MD Referring MD:          [name] (Referring MD) Medicines:             General Anesthesia Complications:         No immediate complications. Estimated blood loss: None. Procedure:             Pre-Anesthesia Assessment:                        - Prior to the procedure, a History and Physical was                         performed, and patient medications and allergies were                         reviewed. The patient is competent. The risks and                         benefits of the procedure and the sedation options and                         risks were discussed with the patient. All questions                         were answered and informed consent was obtained.                         Patient identification and proposed procedure were                         verified by the physician, the nurse, the                         anesthesiologist, the anesthetist and the technician                         in the pre-procedure area in the procedure room in the                         endoscopy suite. Mental Status Examination: alert and                         oriented. Airway Examination: normal oropharyngeal                         airway and neck mobility. Respiratory Examination:  clear to auscultation. CV Examination: normal.                         Prophylactic Antibiotics: The patient does not require                         prophylactic antibiotics.  Prior Anticoagulants: The                         patient has taken no previous anticoagulant or                         antiplatelet agents. ASA Grade Assessment: III - A                         patient with severe systemic disease. After reviewing                         the risks and benefits, the patient was deemed in                         satisfactory condition to undergo the procedure. The                         anesthesia plan was to use general anesthesia.                         Immediately prior to administration of medications,                         the patient was re-assessed for adequacy to receive                         sedatives. The heart rate, respiratory rate, oxygen                         saturations, blood pressure, adequacy of pulmonary                         ventilation, and response to care were monitored                         throughout the procedure. The physical status of the                         patient was re-assessed after the procedure.                        After obtaining informed consent, the colonoscope was                         passed under direct vision. Throughout the procedure,                         the patient's blood pressure, pulse, and oxygen                         saturations were monitored continuously. The  Colonoscope was introduced through the anus and                         advanced to the the cecum, identified by appendiceal                         orifice and ileocecal valve. The colonoscopy was                         performed without difficulty. The patient tolerated                         the procedure well. The quality of the bowel                         preparation was evaluated using the BBPS [facility] Bowel                         Preparation Scale) with scores of: Right Colon = 3,                         Transverse Colon = 3 and Left Colon = 3 (entire mucosa                         seen  well with no residual staining, small fragments                         of stool or opaque liquid). The total BBPS score                         equals 9. Findings:      The perianal and digital rectal examinations were normal. Pertinent       negatives include normal sphincter tone and no palpable rectal lesions.      The entire examined colon appeared normal.      Non-bleeding external hemorrhoids were found during retroflexion. The       hemorrhoids were small. Impression:            - The entire examined colon is normal.                        - Non-bleeding external hemorrhoids.                        - No specimens collected. Recommendation:        - Discharge patient to home (with escort).                        - Resume previous diet today.                        - Continue present medications.                        - Repeat colonoscopy in 10 years for screening                         purposes. Procedure Code(s):     --- Professional ---  U2725, Colorectal cancer screening; colonoscopy on                         individual not meeting criteria for high risk Diagnosis Code(s):     --- Professional ---                        Z12.11, Encounter for screening for malignant neoplasm                         of colon                        K64.4, Residual hemorrhoidal skin tags CPT copyright 2019 American Medical Association. All rights reserved. The codes documented in this report are preliminary and upon coder review may  be revised to meet current compliance requirements. Dr. Ulyess Mort Lin Landsman MD, MD 10/09/2021 11:17:23 AM This report has been signed electronically. Number of Addenda: 0 Note Initiated On: 10/09/2021 10:48 AM Scope Withdrawal Time: 0 hours 10 minutes 27 seconds  Total Procedure Duration: 0 hours 14 minutes 15 seconds  Estimated Blood Loss:  Estimated blood loss: none.      [facility]

## 2021-10-09 NOTE — Transfer of Care (Signed)
Immediate Anesthesia Transfer of Care Note  Patient: Daniel Bradley.  Procedure(s) Performed: COLONOSCOPY WITH PROPOFOL  Patient Location: PACU and Endoscopy Unit  Anesthesia Type:General  Level of Consciousness: awake  Airway & Oxygen Therapy: Patient Spontanous Breathing  Post-op Assessment: Report given to RN and Post -op Vital signs reviewed and stable  Post vital signs: Reviewed and stable  Last Vitals:  Vitals Value Taken Time  BP 122/70 10/09/21 1115  Temp    Pulse 89 10/09/21 1115  Resp    SpO2 95 % 10/09/21 1115    Last Pain:  Vitals:   10/09/21 1115  TempSrc:   PainSc: Asleep         Complications: No notable events documented.

## 2021-10-09 NOTE — Anesthesia Postprocedure Evaluation (Signed)
Anesthesia Post Note  Patient: Daniel Bradley.  Procedure(s) Performed: COLONOSCOPY WITH PROPOFOL  Patient location during evaluation: Endoscopy Anesthesia Type: General Level of consciousness: awake and alert Pain management: pain level controlled Vital Signs Assessment: post-procedure vital signs reviewed and stable Respiratory status: spontaneous breathing, nonlabored ventilation and respiratory function stable Cardiovascular status: blood pressure returned to baseline and stable Postop Assessment: no apparent nausea or vomiting Anesthetic complications: no   No notable events documented.   Last Vitals:  Vitals:   10/09/21 1125 10/09/21 1135  BP: 117/67 (!) 127/97  Pulse: 87 78  Resp: 14 20  Temp:    SpO2: 97% 97%    Last Pain:  Vitals:   10/09/21 1135  TempSrc:   PainSc: 0-No pain                 Iran Ouch

## 2021-10-09 NOTE — H&P (Signed)
[name], MD [address]  [place]  [place], [place] [address]  Main: [phone]  Fax: [phone] Pager: [phone]  Primary Care Physician:  [name], NP Primary Gastroenterologist:  Dr. [name]  Pre-Procedure History & Physical: HPI:  Daniel Bradley. is a 66 y.o. male is here for an colonoscopy.   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   Elevated prostate specific antigen (PSA) 02/21/2013   Hemorrhoid    History of kidney stones    Hyperlipidemia    Hypertension    Incomplete bladder emptying 01/10/2013   Malignant neoplasm of prostate ([place]) 04/28/2013   prostate removed   Myocardial infarction ([place]) 2019   2 stents placed   Obstructive sleep apnea on CPAP    does not use cpap, does not snore much since loosing 80 lbs   Prostate cancer ([place])     Past Surgical History:  Procedure Laterality Date   COLONOSCOPY     CORONARY STENT INTERVENTION N/A 08/20/2017   Procedure: CORONARY STENT INTERVENTION;  Surgeon: Yolonda Kida, MD;  Location: [facility];  Service: Cardiovascular;  Laterality: N/A;   dental implants     DG KNEE RIGHT COMPLETE ([facility]) Right 05/23/2019   KNEE ARTHROPLASTY Left 05/23/2019   Procedure: COMPUTER ASSISTED TOTAL KNEE ARTHROPLASTY;  Surgeon: Dereck Leep, MD;  Location: [facility];  Service: Orthopedics;  Laterality: Left;   LEFT HEART CATH AND CORONARY ANGIOGRAPHY N/A 08/20/2017   Procedure: LEFT HEART CATH AND CORONARY ANGIOGRAPHY;  Surgeon: Teodoro Spray, MD;  Location: [facility];  Service: Cardiovascular;  Laterality: N/A;   PROSTATE SURGERY  07/14/2013   Prostatectomy    Prior to Admission medications   Medication Sig Start Date End Date Taking? Authorizing  Provider  aspirin EC 81 MG tablet 81 mg daily. Swallow whole.    [provider]  Cholecalciferol 125 MCG (5000 UT) capsule Take by mouth.    [provider]  doxylamine, Sleep, (UNISOM) 25 MG tablet Take 25 mg by mouth at bedtime as needed.    [provider]  glipiZIDE (GLUCOTROL) 10 MG tablet TAKE 1 TABLET(10 MG) BY MOUTH TWICE DAILY BEFORE A MEAL 10/01/21   [name], NP  metFORMIN (GLUCOPHAGE) 850 MG tablet Take 1 tablet (850 mg total) by mouth 2 (two) times daily with a meal. 10/01/21   Baity, Coralie Keens, NP  metoprolol tartrate (LOPRESSOR) 25 MG tablet Take 1 tablet (25 mg total) by mouth 2 (two) times daily. 08/21/17   Gladstone Lighter, MD  rosuvastatin (CRESTOR) 20 MG tablet TAKE 1 TABLET(20 MG) BY MOUTH DAILY 09/25/21   [name], NP  RYBELSUS 3 MG TABS TAKE 1 TABLET BY MOUTH DAILY 09/19/21   [name], NP  senna (SENOKOT) 8.6 MG tablet Take 2 tablets by mouth daily.    [provider]    Allergies as of 10/07/2021   (No Known Allergies)    Family History  Problem Relation Age of Onset   Pancreatic cancer Brother    Alcoholism Mother    Alcoholism Father    Prostate cancer Neg Hx    Kidney disease Neg Hx     Social History   Socioeconomic  History   Marital status: Single    Spouse name: Not on file   Number of children: 2   Years of education: Not on file   Highest education level: Not on file  Occupational History   Occupation: [employer]E [employer]ATMENT    Employer: [employer]    Comment: retired  Tobacco Use   Smoking status: Never   Smokeless tobacco: Never  Vaping Use   Vaping Use: Never used  Substance and Sexual Activity   Alcohol use: No    Alcohol/week: 0.0 standard drinks of alcohol    Comment: rare   Drug use: No   Sexual activity: Not Currently  Other Topics Concern   Not on file  Social History Narrative   Lives with girlfriend, April   Social Determinants of Health   Financial Resource Strain:  Low Risk  (08/29/2021)   Overall Financial Resource Strain (CARDIA)    Difficulty of Paying Living Expenses: Not hard at all  Food Insecurity: No Food Insecurity (08/29/2021)   Hunger Vital Sign    Worried About [place] in the Last Year: Never true    [place] in the Last Year: Never true  Transportation Needs: No Transportation Needs (08/29/2021)   PRAPARE - Hydrologist (Medical): No    Lack of Transportation (Non-Medical): No  Physical Activity: Sufficiently Active (08/29/2021)   Exercise Vital Sign    Days of Exercise per Week: 4 days    Minutes of Exercise per Session: 40 min  Stress: Stress Concern Present (08/29/2021)   [place]    Feeling of Stress : To some extent  Social Connections: Moderately Isolated (08/29/2021)   Social Connection and Isolation Panel [NHANES]    Frequency of Communication with Friends and Family: More than three times a week    Frequency of Social Gatherings with Friends and Family: Once a week    Attends Religious Services: 1 to 4 times per year    Active Member of [employer] or Organizations: No    Attends Music therapist: Never    Marital Status: Divorced  Human resources officer Violence: Not on file    Review of Systems: See HPI, otherwise negative ROS  Physical Exam: BP (!) 161/99   Pulse 93   Temp (!) 96.9 F (36.1 C) (Temporal)   Resp 20   Ht '6\' 1"'$  (1.854 m)   Wt 130.6 kg   SpO2 96%   BMI 38.00 kg/m  General:   Alert,  pleasant and cooperative in NAD Head:  Normocephalic and atraumatic. Neck:  Supple; no masses or thyromegaly. Lungs:  Clear throughout to auscultation.    Heart:  Regular rate and rhythm. Abdomen:  Soft, nontender and nondistended. Normal bowel sounds, without guarding, and without rebound.   Neurologic:  Alert and  oriented x4;  grossly normal neurologically.  Impression/Plan: Daniel Blase. is  here for an colonoscopy to be performed for colon cancer screening  Risks, benefits, limitations, and alternatives regarding  colonoscopy have been reviewed with the patient.  Questions have been answered.  All parties agreeable.   Sherri Sear, MD  10/09/2021, 10:41 AM

## 2021-10-09 NOTE — Anesthesia Preprocedure Evaluation (Addendum)
Anesthesia Evaluation  Patient identified by MRN, date of birth, ID band Patient awake    Reviewed: Allergy & Precautions, NPO status , Patient's Chart, lab work & pertinent test results  History of Anesthesia Complications Negative for: history of anesthetic complications  Airway Mallampati: III  TM Distance: >3 FB Neck ROM: Full    Dental  (+) Partial Upper   Pulmonary sleep apnea (noncompliant with CPAP) , neg COPD,    breath sounds clear to auscultation- rhonchi (-) wheezing      Cardiovascular METS: 3 - Mets hypertension, Pt. on medications (-) angina+ CAD, + Past MI and + Cardiac Stents (2019)  (-) CABG  Rhythm:Regular Rate:Normal - Systolic murmurs and - Diastolic murmurs    Neuro/Psych neg Seizures PSYCHIATRIC DISORDERS Depression negative neurological ROS     GI/Hepatic negative GI ROS, Neg liver ROS,   Endo/Other  diabetes, Oral Hypoglycemic Agents  Renal/GU negative Renal ROS     Musculoskeletal  (+) Arthritis ,   Abdominal (+) + obese,   Peds  Hematology negative hematology ROS (+)   Anesthesia Other Findings Past Medical History: No date: Arthritis of knee 04/28/2013: Bladder outflow obstruction No date: Coronary artery disease 01/10/2013: Decreased libido No date: Depression No date: DM (diabetes mellitus) ([place]) 01/10/2013: ED (erectile dysfunction) of organic origin 02/21/2013: Elevated prostate specific antigen (PSA) No date: Hemorrhoid No date: History of kidney stones No date: Hyperlipidemia No date: Hypertension 01/10/2013: Incomplete bladder emptying 04/28/2013: Malignant neoplasm of prostate [facility])     Comment:  prostate removed 2019: Myocardial infarction [facility])     Comment:  2 stents placed No date: Obstructive sleep apnea on CPAP     Comment:  does not use cpap, does not snore much since loosing 80               lbs   Reproductive/Obstetrics                             Lab Results  Component Value Date   WBC 9.6 06/23/2021   HGB 13.7 06/23/2021   HCT 44.2 06/23/2021   MCV 80.1 06/23/2021   PLT 289 06/23/2021    Anesthesia Physical  Anesthesia Plan  ASA: III  Anesthesia Plan: General   Post-op Pain Management: Minimal or no pain anticipated   Induction: Intravenous  PONV Risk Score and Plan: 2 and Propofol infusion  Airway Management Planned: Natural Airway  Additional Equipment:   Intra-op Plan:   Post-operative Plan:   Informed Consent: I have reviewed the patients History and Physical, chart, labs and discussed the procedure including the risks, benefits and alternatives for the proposed anesthesia with the patient or authorized representative who has indicated his/her understanding and acceptance.     Dental advisory given  Plan Discussed with: CRNA and Anesthesiologist  Anesthesia Plan Comments:        Anesthesia Quick Evaluation

## 2021-10-10 ENCOUNTER — Encounter: Payer: Self-pay | Admitting: Gastroenterology

## 2021-10-15 ENCOUNTER — Ambulatory Visit (INDEPENDENT_AMBULATORY_CARE_PROVIDER_SITE_OTHER): Payer: Medicare HMO | Admitting: Internal Medicine

## 2021-10-15 ENCOUNTER — Encounter: Payer: Self-pay | Admitting: Internal Medicine

## 2021-10-15 DIAGNOSIS — I10 Essential (primary) hypertension: Secondary | ICD-10-CM

## 2021-10-15 DIAGNOSIS — I25810 Atherosclerosis of coronary artery bypass graft(s) without angina pectoris: Secondary | ICD-10-CM | POA: Diagnosis not present

## 2021-10-15 DIAGNOSIS — I700 Atherosclerosis of aorta: Secondary | ICD-10-CM | POA: Diagnosis not present

## 2021-10-15 DIAGNOSIS — E1169 Type 2 diabetes mellitus with other specified complication: Secondary | ICD-10-CM | POA: Diagnosis not present

## 2021-10-15 DIAGNOSIS — E7849 Other hyperlipidemia: Secondary | ICD-10-CM

## 2021-10-15 NOTE — Assessment & Plan Note (Signed)
C-Met and lipid profile today

## 2021-10-15 NOTE — Assessment & Plan Note (Signed)
CMET today 

## 2021-10-15 NOTE — Progress Notes (Signed)
Subjective:    Patient ID: Daniel Bradley., male    DOB: 04/01/1955, 66 y.o.   MRN: [id number]  HPI  Patient presents to clinic today wanting to discuss his labs.  His recent A1c was 10.7%, 09/2021. He was taking his Rybelsus but not his Metformin and Glipizide. It appears that his CBC, c-Met and lipid profile were not drawn. He would like to get those labs today.  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 Swallow whole.     Cholecalciferol 125 MCG (5000 UT) capsule Take by mouth.     doxylamine, Sleep, (UNISOM) 25 MG tablet Take 25 mg by mouth at bedtime as needed.     glipiZIDE (GLUCOTROL) 10 MG tablet TAKE 1 TABLET(10 MG) BY MOUTH TWICE DAILY BEFORE A MEAL 180 tablet 0   metFORMIN (GLUCOPHAGE) 850 MG tablet Take 1 tablet (850 mg total) by mouth 2 (two) times daily with a meal. 180 tablet 0   metoprolol tartrate (LOPRESSOR) 25 MG tablet Take 1 tablet (25 mg total) by mouth 2 (two) times daily. 60 tablet 2   rosuvastatin (CRESTOR) 20 MG tablet TAKE 1 TABLET(20 MG) BY MOUTH DAILY 90 tablet 0   RYBELSUS 3 MG TABS TAKE 1 TABLET BY MOUTH DAILY 90 tablet 0   senna (SENOKOT) 8.6 MG tablet Take 2 tablets by mouth daily.     No  current facility-administered medications for this visit.    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    Employer: [employer]    Comment: retired  Tobacco Use   Smoking status: Never   Smokeless tobacco: Never  Vaping Use   Vaping Use: Never used  Substance and Sexual Activity   Alcohol use: No    Alcohol/week: 0.0 standard drinks of alcohol    Comment: rare   Drug use: No   Sexual activity: Not Currently  Other Topics Concern   Not on file  Social History Narrative   Lives with girlfriend, April   Social Determinants of Health   Financial Resource Strain: Low Risk  (08/29/2021)   Overall  Financial Resource Strain (CARDIA)    Difficulty of Paying Living Expenses: Not hard at all  Food Insecurity: No Food Insecurity (08/29/2021)   Hunger Vital Sign    Worried About Running Out of Food in the Last Year: Never true    Ran Out of Food in the Last Year: Never true  Transportation Needs: No Transportation Needs (08/29/2021)   PRAPARE - Hydrologist (Medical): No    Lack of Transportation (Non-Medical): No  Physical Activity: Sufficiently Active (08/29/2021)   Exercise Vital Sign    Days of Exercise per Week: 4 days    Minutes of Exercise per Session: 40 min  Stress: Stress Concern Present (08/29/2021)   [place]    Feeling of Stress : To some extent  Social Connections: Moderately Isolated (08/29/2021)   Social Connection and Isolation Panel [NHANES]    Frequency of Communication  with Friends and Family: More than three times a week    Frequency of Social Gatherings with Friends and Family: Once a week    Attends Religious Services: 1 to 4 times per year    Active Member of [employer] or Organizations: No    Attends Archivist Meetings: Never    Marital Status: Divorced  Human resources officer Violence: Not on file     Constitutional: Denies fever, malaise, fatigue, headache or abrupt weight changes.  HEENT: Denies eye pain, eye redness, ear pain, ringing in the ears, wax buildup, runny nose, nasal congestion, bloody nose, or sore throat. Respiratory: Denies difficulty breathing, shortness of breath, cough or sputum production.   Cardiovascular: Denies chest pain, chest tightness, palpitations or swelling in the hands or feet.  Gastrointestinal: Denies abdominal pain, bloating, constipation, diarrhea or blood in the stool.  GU: Denies urgency, frequency, pain with urination, burning sensation, blood in urine, odor or discharge. Musculoskeletal: Patient reports joint pain.  Denies decrease in range of motion, difficulty with gait, muscle pain or joint swelling.  Skin: Denies redness, rashes, lesions or ulcercations.  Neurological: Denies dizziness, difficulty with memory, difficulty with speech or problems with balance and coordination.  Psych: Denies anxiety, depression, SI/HI.  No other specific complaints in a complete review of systems (except as listed in HPI above).  Objective:   Physical Exam   BP 136/80 (BP Location: Left Arm, Patient Position: Sitting, Cuff Size: Large)   Pulse 73   Temp (!) 97.3 F (36.3 C) (Temporal)   Wt 289 lb (131.1 kg)   SpO2 97%   BMI 38.13 kg/m   Wt Readings from Last 3 Encounters:  10/09/21 288 lb (130.6 kg)  10/01/21 289 lb (131.1 kg)  08/20/21 289 lb 12.8 oz (131.5 kg)    General: Appears his stated age, obese, in NAD. Skin: Warm, dry and intact. HEENT: Head: normal shape and size; Eyes: sclera white, no icterus,  conjunctiva pink, PERRLA and EOMs intact;  Cardiovascular: Normal rate. Pulmonary/Chest: Normal effort and positive vesicular breath sounds. No respiratory distress. No wheezes, rales or ronchi noted.  Musculoskeletal:  No difficulty with gait.  Neurological: Alert and oriented.  BMET    Component Value Date/Time   NA 138 06/23/2021 2133   NA 139 03/30/2020 1015   NA 139 04/21/2012 0641   K 4.1 06/23/2021 2133   K 3.8 04/21/2012 0641   CL 106 06/23/2021 2133   CL 107 04/21/2012 0641   CO2 21 (L) 06/23/2021 2133   CO2 27 04/21/2012 0641  GLUCOSE 313 (H) 06/23/2021 2133   GLUCOSE 212 (H) 04/21/2012 0641   BUN 19 06/23/2021 2133   BUN 14 03/30/2020 1015   BUN 22 (H) 04/21/2012 0641   CREATININE 1.24 06/23/2021 2133   CREATININE 0.95 06/20/2021 1520   CALCIUM 9.3 06/23/2021 2133   CALCIUM 8.7 04/21/2012 0641   GFRNONAA >60 06/23/2021 2133   GFRNONAA 51 (L) 04/21/2012 0641   GFRAA 100 03/30/2020 1015   GFRAA 59 (L) 04/21/2012 0641    Lipid Panel     Component Value Date/Time   CHOL 198 06/20/2021 1520   CHOL 128 03/30/2020 1015   TRIG 177 (H) 06/20/2021 1520   HDL 51 06/20/2021 1520   HDL 41 03/30/2020 1015   CHOLHDL 3.9 06/20/2021 1520   VLDL 14 08/20/2017 0359   LDLCALC 118 (H) 06/20/2021 1520    CBC    Component Value Date/Time   WBC 9.6 06/23/2021 2133   RBC 5.52 06/23/2021 2133   HGB 13.7 06/23/2021 2133   HGB 14.0 03/30/2020 1015   HCT 44.2 06/23/2021 2133   HCT 42.5 03/30/2020 1015   PLT 289 06/23/2021 2133   PLT 306 03/30/2020 1015   MCV 80.1 06/23/2021 2133   MCV 79 03/30/2020 1015   MCV 80 04/21/2012 0641   MCH 24.8 (L) 06/23/2021 2133   MCHC 31.0 06/23/2021 2133   RDW 14.4 06/23/2021 2133   RDW 14.6 03/30/2020 1015   RDW 14.9 (H) 04/21/2012 0641   LYMPHSABS 1.8 03/30/2020 1015   MONOABS 0.5 04/03/2019 0757   EOSABS 0.3 03/30/2020 1015   BASOSABS 0.1 03/30/2020 1015    Hgb A1C Lab Results  Component Value Date   HGBA1C 10.9 (A) 10/01/2021            Assessment & Plan:    RTC in 3 months for follow-up of chronic conditions Webb Silversmith, NP

## 2021-10-16 ENCOUNTER — Telehealth: Payer: Self-pay | Admitting: Internal Medicine

## 2021-10-16 ENCOUNTER — Other Ambulatory Visit: Payer: Self-pay

## 2021-10-16 LAB — COMPLETE METABOLIC PANEL WITH GFR
AG Ratio: 1.7 (calc) (ref 1.0–2.5)
ALT: 31 U/L (ref 9–46)
AST: 21 U/L (ref 10–35)
Albumin: 4.3 g/dL (ref 3.6–5.1)
Alkaline phosphatase (APISO): 101 U/L (ref 35–144)
BUN: 19 mg/dL (ref 7–25)
CO2: 24 mmol/L (ref 20–32)
Calcium: 9.9 mg/dL (ref 8.6–10.3)
Chloride: 104 mmol/L (ref 98–110)
Creat: 1.02 mg/dL (ref 0.70–1.35)
Globulin: 2.6 g/dL (calc) (ref 1.9–3.7)
Glucose, Bld: 83 mg/dL (ref 65–139)
Potassium: 4.6 mmol/L (ref 3.5–5.3)
Sodium: 141 mmol/L (ref 135–146)
Total Bilirubin: 0.4 mg/dL (ref 0.2–1.2)
Total Protein: 6.9 g/dL (ref 6.1–8.1)
eGFR: 82 mL/min/{1.73_m2} (ref >=60)

## 2021-10-16 LAB — CBC
HCT: 43.4 % (ref 38.5–50.0)
Hemoglobin: 13.9 g/dL (ref 13.2–17.1)
MCH: 25.6 pg — ABNORMAL LOW (ref 27.0–33.0)
MCHC: 32 g/dL (ref 32.0–36.0)
MCV: 79.8 fL — ABNORMAL LOW (ref 80.0–100.0)
MPV: 10.4 fL (ref 7.5–12.5)
Platelets: 321 10*3/uL (ref 140–400)
RBC: 5.44 10*6/uL (ref 4.20–5.80)
RDW: 14.8 % (ref 11.0–15.0)
WBC: 6.5 10*3/uL (ref 3.8–10.8)

## 2021-10-16 LAB — LIPID PANEL
Cholesterol: 229 mg/dL — ABNORMAL HIGH (ref <200)
HDL: 49 mg/dL (ref >=40)
LDL Cholesterol (Calc): 152 mg/dL (calc) — ABNORMAL HIGH
Non-HDL Cholesterol (Calc): 180 mg/dL (calc) — ABNORMAL HIGH (ref <130)
Total CHOL/HDL Ratio: 4.7 (calc) (ref <5.0)
Triglycerides: 153 mg/dL — ABNORMAL HIGH (ref <150)

## 2021-10-16 MED ORDER — ROSUVASTATIN CALCIUM 20 MG PO TABS: 20.0000 mg | ORAL_TABLET | Freq: Every day | ORAL | 0 refills | Status: DC

## 2021-10-16 NOTE — Telephone Encounter (Signed)
Pt states [facility] advised him it would be a month or more before they will have the rosuvastatin (CRESTOR) 20 MG tablet In stock. Would like to know if you want to send to another pharmacy or ok to wait?

## 2021-10-17 NOTE — Telephone Encounter (Signed)
He needs to get started on this.  Do they have the 10 mg in stock?  Or I can send the 20 mg to a different pharmacy, he just needs to let me know which one.

## 2021-10-17 NOTE — Telephone Encounter (Signed)
Pt states he has enough rosuvastatin for 90 days.  He would like to try a different medication other than Rybelsus.  He says it is too expensive.  He got if filled for 90 days but will need something cheaper.   FYI... Pt has an appointment with you 09/13 to discuss weight loss options (including surgery)   Thanks,   -Mickel Baas

## 2021-10-18 NOTE — Telephone Encounter (Signed)
We can discuss this at his upcoming appt then

## 2021-10-23 ENCOUNTER — Telehealth: Payer: Medicare HMO | Admitting: Internal Medicine

## 2021-11-06 DIAGNOSIS — H40013 Open angle with borderline findings, low risk, bilateral: Secondary | ICD-10-CM | POA: Diagnosis not present

## 2021-11-06 DIAGNOSIS — H1045 Other chronic allergic conjunctivitis: Secondary | ICD-10-CM | POA: Diagnosis not present

## 2021-11-06 DIAGNOSIS — H2513 Age-related nuclear cataract, bilateral: Secondary | ICD-10-CM | POA: Diagnosis not present

## 2021-11-06 DIAGNOSIS — E119 Type 2 diabetes mellitus without complications: Secondary | ICD-10-CM | POA: Diagnosis not present

## 2021-11-06 LAB — HM DIABETES EYE EXAM

## 2021-11-14 ENCOUNTER — Other Ambulatory Visit: Payer: Self-pay | Admitting: *Deleted

## 2021-11-14 DIAGNOSIS — Z8546 Personal history of malignant neoplasm of prostate: Secondary | ICD-10-CM

## 2021-11-15 ENCOUNTER — Other Ambulatory Visit: Payer: Medicare HMO

## 2021-11-15 DIAGNOSIS — Z8546 Personal history of malignant neoplasm of prostate: Secondary | ICD-10-CM | POA: Diagnosis not present

## 2021-11-16 LAB — PSA: Prostate Specific Ag, Serum: <0.1 ng/mL (ref 0.0–4.0)

## 2021-11-19 ENCOUNTER — Encounter: Payer: Self-pay | Admitting: Urology

## 2021-11-19 ENCOUNTER — Ambulatory Visit: Payer: Medicare HMO | Admitting: Urology

## 2021-11-19 VITALS — BP 127/83 | HR 99 | Ht 73.0 in | Wt 286.0 lb

## 2021-11-19 DIAGNOSIS — Z87442 Personal history of urinary calculi: Secondary | ICD-10-CM | POA: Diagnosis not present

## 2021-11-19 DIAGNOSIS — N5231 Erectile dysfunction following radical prostatectomy: Secondary | ICD-10-CM

## 2021-11-19 DIAGNOSIS — Z8546 Personal history of malignant neoplasm of prostate: Secondary | ICD-10-CM | POA: Diagnosis not present

## 2021-11-19 DIAGNOSIS — N3289 Other specified disorders of bladder: Secondary | ICD-10-CM

## 2021-11-19 DIAGNOSIS — R350 Frequency of micturition: Secondary | ICD-10-CM

## 2021-11-19 DIAGNOSIS — N200 Calculus of kidney: Secondary | ICD-10-CM

## 2021-11-19 NOTE — Progress Notes (Signed)
11/19/2021 11:07 AM   Daniel Leyland Jr. 1955-05-08 [id number]  Referring provider: No referring provider defined for this encounter.  Chief Complaint  Patient presents with   Elevated PSA    HPI: 66 year old male with a personal history of prostate cancer, possible bladder mass, kidney stones and erectile dysfunction who returns today for routine annual follow-up.  Please see previous notes for details.  Notably, since last year, he was seen in the emergency room in 06/2021 with an acute stone event.  He passed a 5 mm left distal ureteral calculus.  His pain was able to be controlled he passed the stone at home.  His not having flank pain since.  On CT scan, he does have bilateral stones, right greater than left up to 9 mm.  He has a known personal history of kidney stones.  His personal history of prostate cancer status post prostatectomy back in 2015.  His PSA remains undetectable.  He does have severe baseline erectile dysfunction.  He tried and failed PDE 5 inhibitors.  He does not like needles and not interested in intracavernosal injections.  We did try to order Muse last year but he was unable to obtain this medication due to a national back order.  He is and continues to be interested in penile prosthesis but knows his hemoglobin A1c is still poorly controlled, greater than 10.  He has baseline urinary frequency but is not particular bothered by this.  He gets up 0-2 times at night.  He knows of his diabetes were better controlled, eating better in this front as well.   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   Coronary artery disease    Decreased libido 01/10/2013   Depression    DM (diabetes mellitus) ([place])    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/2013   Elevated prostate specific antigen (PSA) 02/21/2013   Hemorrhoid    History of kidney stones    Hyperlipidemia    Hypertension     Incomplete bladder emptying 01/10/2013   Malignant neoplasm of prostate ([place]) 04/28/2013   prostate removed   Myocardial infarction ([place]) 2019   2 stents placed   Obstructive sleep apnea on CPAP    does not use cpap, does not snore much since loosing 80 lbs   Prostate cancer [facility])     Surgical History: Past Surgical History:  Procedure Laterality Date   COLONOSCOPY     COLONOSCOPY WITH PROPOFOL N/A 10/09/2021   Procedure: COLONOSCOPY WITH PROPOFOL;  Surgeon: Lin Landsman, MD;  Location: [facility] ENDOSCOPY;  Service: Gastroenterology;  Laterality: N/A;   CORONARY STENT INTERVENTION N/A 08/20/2017   Procedure: CORONARY STENT INTERVENTION;  Surgeon: Yolonda Kida, MD;  Location: [place] CV LAB;  Service: Cardiovascular;  Laterality: N/A;   dental implants     DG KNEE RIGHT COMPLETE ([place] HX) Right 05/23/2019   KNEE ARTHROPLASTY Left 05/23/2019   Procedure: COMPUTER ASSISTED TOTAL KNEE ARTHROPLASTY;  Surgeon: Dereck Leep, MD;  Location: [facility] ORS;  Service: Orthopedics;  Laterality: Left;   LEFT HEART CATH AND CORONARY ANGIOGRAPHY N/A 08/20/2017   Procedure: LEFT HEART CATH AND CORONARY ANGIOGRAPHY;  Surgeon: Teodoro Spray, MD;  Location: [place] [facility];  Service: Cardiovascular;  Laterality: N/A;   PROSTATE SURGERY  07/14/2013   Prostatectomy    Home Medications:  Allergies as of 11/19/2021   No Known Allergies      Medication  List        Accurate as of November 19, 2021 11:07 AM. If you have any questions, ask your nurse or doctor.          aspirin EC 81 MG tablet 81 mg daily. Swallow whole.   Cholecalciferol 125 MCG (5000 UT) capsule Take by mouth.   doxylamine (Sleep) 25 MG tablet Commonly known as: UNISOM Take 25 mg by mouth at bedtime as needed.   glipiZIDE 10 MG tablet Commonly known as: GLUCOTROL TAKE 1 TABLET(10 MG) BY MOUTH TWICE DAILY BEFORE A MEAL   metFORMIN 850 MG tablet Commonly known as: Glucophage Take 1 tablet (850 mg  total) by mouth 2 (two) times daily with a meal.   metoprolol tartrate 25 MG tablet Commonly known as: LOPRESSOR Take 1 tablet (25 mg total) by mouth 2 (two) times daily.   rosuvastatin 20 MG tablet Commonly known as: CRESTOR Take 1 tablet (20 mg total) by mouth daily.   Rybelsus 3 MG Tabs Generic drug: Semaglutide TAKE 1 TABLET BY MOUTH DAILY   senna 8.6 MG tablet Commonly known as: SENOKOT Take 2 tablets by mouth daily.        Allergies: No Known Allergies  Family History: Family History  Problem Relation Age of Onset   Pancreatic cancer Brother    Alcoholism Mother    Alcoholism Father    Prostate cancer Neg Hx    Kidney disease Neg Hx     Social History:  reports that he has never smoked. He has never used smokeless tobacco. He reports that he does not drink alcohol and does not use drugs.   Physical Exam: BP 127/83   Pulse 99   Ht '6\' 1"'$  (1.854 m)   Wt 286 lb (129.7 kg)   BMI 37.73 kg/m   Constitutional:  Alert and oriented, No acute distress. Neurologic: Grossly intact, no focal deficits, moving all 4 extremities. Psychiatric: Normal mood and affect.  Laboratory Data: Lab Results  Component Value Date   WBC 6.5 10/15/2021   HGB 13.9 10/15/2021   HCT 43.4 10/15/2021   MCV 79.8 (L) 10/15/2021   PLT 321 10/15/2021    Lab Results  Component Value Date   CREATININE 1.02 10/15/2021     Lab Results  Component Value Date   HGBA1C 10.9 (A) 10/01/2021     Pertinent Imaging:  CT Renal Stone Study  Narrative CLINICAL DATA:  Flank pain  EXAM: CT ABDOMEN AND PELVIS WITHOUT CONTRAST  TECHNIQUE: Multidetector CT imaging of the abdomen and pelvis was performed following the standard protocol without IV contrast.  RADIATION DOSE REDUCTION: This exam was performed according to the departmental dose-optimization program which includes automated exposure control, adjustment of the mA and/or kV according to patient size and/or use of iterative  reconstruction technique.  COMPARISON:  CT pelvis dated 10/02/2020. CT abdomen/pelvis dated 04/03/2019.  FINDINGS: Lower chest: Lung bases are clear.  Hepatobiliary: Unenhanced liver is unremarkable.  Gallbladder is unremarkable. No intrahepatic or extrahepatic ductal dilatation.  Pancreas: Within normal limits.  Spleen: Within normal limits.  Adrenals/Urinary Tract: Adrenal glands are within normal limits.  Multiple nonobstructing bilateral renal calculi measuring up to 9 mm in the right upper kidney (series 2/image 43). Mild left hydronephrosis with perinephric fluid/stranding. Associated 5 mm distal left ureteral calculus just above the UVJ (coronal image 130).  Bladder is notable for a stable partially calcified lesion anteriorly (series 2/image 88).  Stomach/Bowel: Stomach is notable for a tiny hiatal hernia.  No evidence of  bowel obstruction.  Normal appendix (series 2/image 53).  No colonic wall thickening or inflammatory changes.  Vascular/Lymphatic: No evidence of abdominal aortic aneurysm.  Atherosclerotic calcifications of the abdominal aorta and branch vessels.  No suspicious abdominopelvic lymphadenopathy.  Reproductive: Status post prostatectomy.  Other: No abdominopelvic ascites.  Musculoskeletal: Degenerative changes of the lumbar spine. No focal osseous lesions.  IMPRESSION: 5 mm distal left ureteral calculus just above the UVJ. Mild left hydronephrosis.  Additional bilateral nonobstructing renal calculi measuring up to 9 mm in the right upper kidney.  Status post prostatectomy. No findings suspicious for metastatic disease.  Additional stable ancillary findings as above.   Electronically Signed By: Julian Hy M.D. On: 06/24/2021 00:58  I personally reviewed the above CT scan agree with the radiologic interpretation.  Additionally, the bladder calcification on the anterior wall is unchanged.  Assessment & Plan:    1.  Kidney stones Personal history of kidney stone status post interval passage of a left distal stone in May  We discussed general stone prevention techniques including drinking plenty water with goal of producing 2.5 L urine daily, increased citric acid intake, avoidance of high oxalate containing foods, and decreased salt intake.  Information about dietary recommendations given today.  Plan for KUB in a year, otherwise will manage conservatively, he is agreeable this plan  Return sooner if he develops flank pain - Abdomen 1 view (KUB); Future  2. History of prostate cancer No evidence of disease, will continue to check PSA every year - PSA; Future  3. Bladder mass Calcification of the anterior bladder wall which is stable times multiple years, suspect this is postop changes.  Minimal concern for neoplasm at this point in time given stability.  Interval imaging was reviewed again today which is reassuring.  4. Erectile dysfunction after radical prostatectomy We discussed various options moving forward.  He was not able to get the Muse, not able to tolerate injections.  We discussed vacuum erection device as an alternative.  He may be interested in a penile prosthesis moving forward but understands that he has to have better blood sugar control.  He will work on this moving forward.  5. Urinary frequency Some bother from this but does not desire any additional medications, will continue to monitor.  May improve with diabetes control.   Return in about 1 year (around 11/20/2022) for KUB, PVR.  Hollice Espy, MD  [facility] [address], [place], [place] [address] [phone]

## 2021-11-29 ENCOUNTER — Telehealth: Payer: Self-pay | Admitting: Internal Medicine

## 2021-11-29 DIAGNOSIS — I10 Essential (primary) hypertension: Secondary | ICD-10-CM

## 2021-11-29 DIAGNOSIS — E1169 Type 2 diabetes mellitus with other specified complication: Secondary | ICD-10-CM

## 2021-11-29 DIAGNOSIS — I700 Atherosclerosis of aorta: Secondary | ICD-10-CM

## 2021-11-29 NOTE — Telephone Encounter (Signed)
  Pt is calling to report that the RYBELSUS 3 MG TABS [[id number]]  is $174 for 30days. Pt is calling to ask is there an alternative medication that he can take or any other recommendations. Please advise [phone]- [phone]

## 2021-12-03 MED ORDER — OZEMPIC (0.25 OR 0.5 MG/DOSE) 2 MG/3ML ~~LOC~~ SOPN: 0.5000 mg | PEN_INJECTOR | SUBCUTANEOUS | 1 refills | Status: DC

## 2021-12-03 NOTE — Telephone Encounter (Signed)
Pt advised to try Ozempic.  Please send to [facility] in [place].   Thanks,   -Mickel Baas

## 2021-12-03 NOTE — Addendum Note (Signed)
Addended by: [name] on: 12/03/2021 10:17 AM   Modules accepted: Orders

## 2021-12-03 NOTE — Telephone Encounter (Signed)
Would he be willing to do an injectable?  Or does he prefer a pill?  Ozempic is the same as Rybelsus it is just in an injectable form, which you inject once weekly.  It may be cheaper.

## 2021-12-03 NOTE — Telephone Encounter (Signed)
Rybelsus DC'd.  Ozempic sent to pharmacy

## 2021-12-03 NOTE — Telephone Encounter (Signed)
Patient called in states, the Ozempic replacement for Rybelsus that was sent in today, is too expensive for him also at over 700.00. Please call back to discuss.

## 2021-12-04 NOTE — Telephone Encounter (Signed)
We could try once daily insulin. Also and injectable. I could also refer him to CCM to help with medication affordability if he is interested. Let me know what he would like to do.

## 2021-12-04 NOTE — Addendum Note (Signed)
Addended by: [name] on: 12/04/2021 10:29 AM   Modules accepted: Orders

## 2021-12-06 MED ORDER — INSULIN DEGLUDEC 100 UNIT/ML ~~LOC~~ SOPN: 10.0000 [IU] | PEN_INJECTOR | Freq: Every day | SUBCUTANEOUS | 0 refills | Status: DC

## 2021-12-06 MED ORDER — INSULIN PEN NEEDLE 31G X 5 MM MISC: 0 refills | Status: DC

## 2021-12-06 NOTE — Telephone Encounter (Signed)
Insulin sent to pharmacy, referral to CCM placed

## 2021-12-06 NOTE — Telephone Encounter (Signed)
Pt agreed to try a daily insulin.  He is also interested in CCM referral.    Thanks,   -Mickel Baas

## 2021-12-06 NOTE — Addendum Note (Signed)
Addended by: [name] on: 12/06/2021 11:27 AM   Modules accepted: Orders

## 2021-12-09 ENCOUNTER — Encounter: Payer: Self-pay | Admitting: Internal Medicine

## 2021-12-09 ENCOUNTER — Ambulatory Visit (INDEPENDENT_AMBULATORY_CARE_PROVIDER_SITE_OTHER): Payer: Medicare HMO | Admitting: Internal Medicine

## 2021-12-09 DIAGNOSIS — Z6837 Body mass index (BMI) 37.0-37.9, adult: Secondary | ICD-10-CM

## 2021-12-09 DIAGNOSIS — E1169 Type 2 diabetes mellitus with other specified complication: Secondary | ICD-10-CM | POA: Diagnosis not present

## 2021-12-09 NOTE — Assessment & Plan Note (Signed)
I instructed him how to use his Tresiba injections, we did his first injection today Continue metformin and glipizide Reinforced low-carb diet and exercise weight loss Advised him to monitor fasting sugars and update me with these readings in 2 weeks

## 2021-12-09 NOTE — Progress Notes (Signed)
Subjective:    Patient ID: Tomasa Blase., male    DOB: 12-29-1955, 66 y.o.   MRN: [id number]  HPI  Patient presents to clinic today for insulin injection education.  His last A1c was 10.9%, 09/2021.  He is currently taking Metformin and Glipizide as prescribed.  He was prescribed [name] but needs to know how to inject this.  He is not currently checking his sugars.  Flu 12/2020.  Pneumovax 12/2020.  COVID x3.  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   Coronary artery disease    Decreased libido 01/10/2013   Depression    DM (diabetes mellitus) ([place])    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/2013   Elevated prostate specific antigen (PSA) 02/21/2013   Hemorrhoid    History of kidney stones    Hyperlipidemia    Hypertension    Incomplete bladder emptying 01/10/2013   Malignant neoplasm of prostate ([place]) 04/28/2013   prostate removed   Myocardial infarction [facility]) 2019   2 stents placed   Obstructive sleep apnea on CPAP    does not use cpap, does not snore much since loosing 80 lbs   Prostate cancer ([facility])     Current Outpatient Medications  Medication Sig Dispense Refill   aspirin EC 81 MG tablet 81 mg daily. Swallow whole.     Cholecalciferol 125 MCG (5000 UT) capsule Take by mouth.     doxylamine, Sleep, (UNISOM) 25 MG tablet Take 25 mg by mouth at bedtime as needed.     glipiZIDE (GLUCOTROL) 10 MG tablet TAKE 1 TABLET(10 MG) BY MOUTH TWICE DAILY BEFORE A MEAL 180 tablet 0   insulin degludec (TRESIBA) 100 UNIT/ML FlexTouch Pen Inject 10 Units into the skin at bedtime. 9 mL 0   Insulin Pen Needle 31G X 5 MM MISC BD Pen Needles- brand specific Inject insulin via insulin pen 6 x daily 90 each 0   metFORMIN (GLUCOPHAGE) 850 MG tablet Take 1 tablet (850 mg total) by mouth 2 (two) times daily with a meal. 180 tablet 0   metoprolol tartrate (LOPRESSOR) 25 MG tablet Take 1 tablet (25  mg total) by mouth 2 (two) times daily. 60 tablet 2   rosuvastatin (CRESTOR) 20 MG tablet Take 1 tablet (20 mg total) by mouth daily. 90 tablet 0   senna (SENOKOT) 8.6 MG tablet Take 2 tablets by mouth daily.     No current facility-administered medications for this visit.    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    Employer: [employer]    Comment: retired  Tobacco Use   Smoking status: Never   Smokeless tobacco: Never  Vaping Use   Vaping Use: Never used  Substance and Sexual Activity   Alcohol use: No    Alcohol/week: 0.0 standard drinks of alcohol    Comment: rare   Drug use: No  Sexual activity: Not Currently  Other Topics Concern   Not on file  Social History Narrative   Lives with girlfriend, April   Social Determinants of Health   Financial Resource Strain: Low Risk  (08/29/2021)   Overall Financial Resource Strain (CARDIA)    Difficulty of Paying Living Expenses: Not hard at all  Food Insecurity: No Food Insecurity (08/29/2021)   Hunger Vital Sign    Worried About Running Out of Food in the Last Year: Never true    Ran Out of Food in the Last Year: Never true  Transportation Needs: No Transportation Needs (08/29/2021)   PRAPARE - Hydrologist (Medical): No    Lack of Transportation (Non-Medical): No  Physical Activity: Sufficiently Active (08/29/2021)   Exercise Vital Sign    Days of Exercise per Week: 4 days    Minutes of Exercise per Session: 40 min  Stress: Stress Concern Present (08/29/2021)   [place]    Feeling of Stress : To some extent  Social Connections: Moderately Isolated (08/29/2021)   Social Connection and Isolation Panel [NHANES]    Frequency of Communication with Friends and Family: More than three times a week    Frequency of Social Gatherings with Friends and Family: Once a week    Attends Religious Services: 1 to 4 times per year    Active Member of [employer] or Organizations: No    Attends Archivist Meetings: Never    Marital Status: Divorced  Human resources officer Violence: Not on file     Constitutional: Denies fever, malaise, fatigue, headache or abrupt weight changes.  HEENT: Denies eye pain, eye redness, ear pain, ringing in the ears, wax buildup, runny nose, nasal congestion, bloody nose, or sore throat. Respiratory: Denies difficulty breathing, shortness of breath, cough or sputum production.   Cardiovascular: Denies chest pain, chest tightness, palpitations or swelling in the hands or feet.  Gastrointestinal: Denies abdominal pain, bloating, constipation, diarrhea or blood in the stool.  GU: Denies urgency, frequency, pain with urination, burning sensation, blood in urine, odor or discharge. Skin: Denies redness, rashes, lesions or ulcercations.  Neurological: Denies dizziness, difficulty with memory, difficulty with speech or problems with balance and coordination.   No other specific complaints in a complete review of systems (except as listed in HPI above).  Objective:   Physical Exam  BP 134/76 (BP Location: Left Arm, Patient Position: Sitting, Cuff Size: Large)   Pulse 91   Temp (!) 97.1 F (36.2 C) (Temporal)   Wt 285 lb (129.3 kg)   SpO2 95%   BMI 37.60 kg/m   Wt Readings from Last 3 Encounters:  11/19/21 286 lb (129.7 kg)  10/15/21 289 lb (131.1 kg)  10/09/21 288 lb (130.6 kg)    General: Appears her stated age,obese, in NAD. Skin: Warm, dry and intact. No ulcerations  noted. Cardiovascular: Normal rate and rhythm. S1,S2 noted.  No murmur, rubs or gallops noted.  Pulmonary/Chest: Normal effort and positive vesicular breath sounds. No respiratory distress. No wheezes, rales or ronchi noted.  Neurological: Alert and oriented.   BMET    Component Value Date/Time   NA 141 10/15/2021 1534   NA 139 03/30/2020 1015   NA 139 04/21/2012 0641   K 4.6 10/15/2021 1534   K 3.8 04/21/2012 0641   CL 104 10/15/2021 1534   CL 107 04/21/2012 0641   CO2 24 10/15/2021 1534   CO2 27 04/21/2012 0641  GLUCOSE 83 10/15/2021 1534   GLUCOSE 212 (H) 04/21/2012 0641   BUN 19 10/15/2021 1534   BUN 14 03/30/2020 1015   BUN 22 (H) 04/21/2012 0641   CREATININE 1.02 10/15/2021 1534   CALCIUM 9.9 10/15/2021 1534   CALCIUM 8.7 04/21/2012 0641   GFRNONAA >60 06/23/2021 2133   GFRNONAA 51 (L) 04/21/2012 0641   GFRAA 100 03/30/2020 1015   GFRAA 59 (L) 04/21/2012 0641    Lipid Panel     Component Value Date/Time   CHOL 229 (H) 10/15/2021 1534   CHOL 128 03/30/2020 1015   TRIG 153 (H) 10/15/2021 1534   HDL 49 10/15/2021 1534   HDL 41 03/30/2020 1015   CHOLHDL 4.7 10/15/2021 1534   VLDL 14 08/20/2017 0359   LDLCALC 152 (H) 10/15/2021 1534    CBC    Component Value Date/Time   WBC 6.5 10/15/2021 1534   RBC 5.44 10/15/2021 1534   HGB 13.9 10/15/2021 1534   HGB 14.0 03/30/2020 1015   HCT 43.4 10/15/2021 1534   HCT 42.5 03/30/2020 1015   PLT 321 10/15/2021 1534   PLT 306 03/30/2020 1015   MCV 79.8 (L) 10/15/2021 1534   MCV 79 03/30/2020 1015   MCV 80 04/21/2012 0641   MCH 25.6 (L) 10/15/2021 1534   MCHC 32.0 10/15/2021 1534   RDW 14.8 10/15/2021 1534   RDW 14.6 03/30/2020 1015   RDW 14.9 (H) 04/21/2012 0641   LYMPHSABS 1.8 03/30/2020 1015   MONOABS 0.5 04/03/2019 0757   EOSABS 0.3 03/30/2020 1015   BASOSABS 0.1 03/30/2020 1015    Hgb A1C Lab Results  Component Value Date   HGBA1C 10.9 (A) 10/01/2021           Assessment & Plan:     RTC in 1  month for follow-up of chronic conditions Webb Silversmith, NP

## 2021-12-09 NOTE — Patient Instructions (Signed)

## 2021-12-09 NOTE — Assessment & Plan Note (Signed)
Encourage diet and exercise for weight loss 

## 2021-12-10 ENCOUNTER — Encounter: Payer: Self-pay | Admitting: Internal Medicine

## 2021-12-18 ENCOUNTER — Telehealth: Payer: Self-pay | Admitting: Internal Medicine

## 2021-12-18 NOTE — Telephone Encounter (Signed)
Patient is requesting a new Blood glucose monitor and supplies. Please send to  [id number] - [address], [place] Phone: [phone]  Fax: [phone]

## 2021-12-19 NOTE — Telephone Encounter (Signed)
Ok to send in meter, strips and lancets

## 2021-12-20 MED ORDER — BLOOD GLUCOSE METER KIT: PACK | 0 refills | Status: AC

## 2021-12-20 NOTE — Telephone Encounter (Signed)
RX sent to the pharmacy   Thanks,   -Mickel Baas

## 2021-12-20 NOTE — Addendum Note (Signed)
Addended by: Ashley Royalty E on: 12/20/2021 03:36 PM   Modules accepted: Orders

## 2021-12-21 ENCOUNTER — Other Ambulatory Visit: Payer: Self-pay | Admitting: Internal Medicine

## 2021-12-23 NOTE — Telephone Encounter (Signed)
Requested Prescriptions  Pending Prescriptions Disp Refills   rosuvastatin (CRESTOR) 20 MG tablet [Pharmacy Med Name: ROSUVASTATIN '20MG'$  TABLETS] 90 tablet 3    Sig: TAKE 1 TABLET(20 MG) BY MOUTH DAILY     Cardiovascular:  Antilipid - Statins 2 Failed - 12/21/2021 12:52 PM      Failed - Lipid Panel in normal range within the last 12 months    Cholesterol, Total  Date Value Ref Range Status  03/30/2020 128 100 - 199 mg/dL Final   Cholesterol  Date Value Ref Range Status  10/15/2021 229 (H) <200 mg/dL Final   LDL Cholesterol (Calc)  Date Value Ref Range Status  10/15/2021 152 (H) mg/dL (calc) Final    Comment:    Reference range: <100 . Desirable range <100 mg/dL for primary prevention;   <70 mg/dL for patients with CHD or diabetic patients  with > or = 2 CHD risk factors. Marland Kitchen LDL-C is now calculated using the Martin-Hopkins  calculation, which is a validated novel method providing  better accuracy than the Friedewald equation in the  estimation of LDL-C.  Cresenciano Genre et al. Annamaria Helling. 7619;509(32): 2061-2068  (http://education.[facility].com/faq/FAQ164)    HDL  Date Value Ref Range Status  10/15/2021 49 > OR = 40 mg/dL Final  03/30/2020 41 >39 mg/dL Final   Triglycerides  Date Value Ref Range Status  10/15/2021 153 (H) <150 mg/dL Final         Passed - Cr in normal range and within 360 days    Creat  Date Value Ref Range Status  10/15/2021 1.02 0.70 - 1.35 mg/dL Final   Creatinine, Urine  Date Value Ref Range Status  12/17/2020 265 20 - 320 mg/dL Final         Passed - Patient is not pregnant      Passed - Valid encounter within last 12 months    Recent Outpatient Visits           2 weeks ago Type 2 diabetes mellitus with other specified complication, without long-term current use of insulin ([place])   [facility], Coralie Keens, NP   2 months ago Essential hypertension   [place], [place] W, NP   2 months ago Type 2  diabetes mellitus with other specified complication, without long-term current use of insulin [facility])   [facility] [place], Coralie Keens, NP   4 months ago Right groin pain   [place], DO   6 months ago Screening for colon cancer   [place], Coralie Keens, NP       Future Appointments             In 2 weeks Garnette Gunner, Coralie Keens, NP [facility], [place]   In 11 months Hollice Espy, [place]

## 2021-12-24 ENCOUNTER — Telehealth: Payer: Self-pay

## 2021-12-24 NOTE — Progress Notes (Signed)
  Chronic Care Management   Note  12/24/2021 Name: Daniel Bradley. MRN: [id number] DOB: 09/18/1955  Daniel Bradley. is a 66 y.o. year old male who is a primary care patient of Daniel Fenton, NP. I reached out to Daniel Bradley. by phone today in response to a referral sent by Mr. Athony Coppa Jr.'s PCP.  Daniel Bradley. was not successfully contacted today. A HIPAA compliant voice message was left requesting a return call.   Follow up plan: Additional outreach attempts will be made.  Noreene Larsson, [place], [place] [address] Direct Dial: [phone] [email or web address]

## 2021-12-24 NOTE — Progress Notes (Signed)
  Chronic Care Management   Note  12/24/2021 Name: Aleksandar Duve. MRN: [id number] DOB: September 03, 1955  Tomasa Blase. is a 67 y.o. year old male who is a primary care patient of [name], NP. I reached out to [employer]. by phone today in response to a referral sent by Mr. Darril Patriarca Jr.'s PCP.  Mr. Dakwan Pridgen.  agreedto scheduling an appointment with the CCM RN Case Manager   Follow up plan: Patient agreed to scheduled appointment with RN Case Manager on 01/01/2022 and Pharm D 01/10/2022 (date/time).   Noreene Larsson, [place], [place] [address] Direct Dial: [phone] [email or web address]

## 2021-12-25 ENCOUNTER — Other Ambulatory Visit: Payer: Self-pay | Admitting: Internal Medicine

## 2021-12-25 DIAGNOSIS — E119 Type 2 diabetes mellitus without complications: Secondary | ICD-10-CM

## 2021-12-25 NOTE — Telephone Encounter (Signed)
Requested Prescriptions  Pending Prescriptions Disp Refills   metFORMIN (GLUCOPHAGE) 850 MG tablet [Pharmacy Med Name: METFORMIN 850MG TABLETS] 180 tablet 0    Sig: TAKE 1 TABLET(850 MG) BY MOUTH TWICE DAILY WITH A MEAL     Endocrinology:  Diabetes - Biguanides Failed - 12/25/2021 12:27 PM      Failed - HBA1C is between 0 and 7.9 and within 180 days    Hemoglobin A1C  Date Value Ref Range Status  10/01/2021 10.9 (A) 4.0 - 5.6 % Final   Hgb A1c MFr Bld  Date Value Ref Range Status  12/17/2020 8.2 (H) <5.7 % of total Hgb Final    Comment:    For someone without known diabetes, a hemoglobin A1c value of 6.5% or greater indicates that they may have  diabetes and this should be confirmed with a follow-up  test. . For someone with known diabetes, a value <7% indicates  that their diabetes is well controlled and a value  greater than or equal to 7% indicates suboptimal  control. A1c targets should be individualized based on  duration of diabetes, age, comorbid conditions, and  other considerations. . Currently, no consensus exists regarding use of hemoglobin A1c for diagnosis of diabetes for children. .          Failed - B12 Level in normal range and within 720 days    No results found for: "VITAMINB12"       Failed - CBC within normal limits and completed in the last 12 months    WBC  Date Value Ref Range Status  10/15/2021 6.5 3.8 - 10.8 Thousand/uL Final   RBC  Date Value Ref Range Status  10/15/2021 5.44 4.20 - 5.80 Million/uL Final   Hemoglobin  Date Value Ref Range Status  10/15/2021 13.9 13.2 - 17.1 g/dL Final  03/30/2020 14.0 13.0 - 17.7 g/dL Final   HCT  Date Value Ref Range Status  10/15/2021 43.4 38.5 - 50.0 % Final   Hematocrit  Date Value Ref Range Status  03/30/2020 42.5 37.5 - 51.0 % Final   MCHC  Date Value Ref Range Status  10/15/2021 32.0 32.0 - 36.0 g/dL Final   [facility]  Date Value Ref Range Status  10/15/2021 25.6 (L) 27.0 - 33.0 pg Final    MCV  Date Value Ref Range Status  10/15/2021 79.8 (L) 80.0 - 100.0 fL Final  03/30/2020 79 79 - 97 fL Final  04/21/2012 80 80 - 100 fL Final   No results found for: "PLTCOUNTKUC", "LABPLAT", "POCPLA" RDW  Date Value Ref Range Status  10/15/2021 14.8 11.0 - 15.0 % Final  03/30/2020 14.6 11.6 - 15.4 % Final  04/21/2012 14.9 (H) 11.5 - 14.5 % Final         Passed - Cr in normal range and within 360 days    Creat  Date Value Ref Range Status  10/15/2021 1.02 0.70 - 1.35 mg/dL Final   Creatinine, Urine  Date Value Ref Range Status  12/17/2020 265 20 - 320 mg/dL Final         Passed - eGFR in normal range and within 360 days    EGFR (African American)  Date Value Ref Range Status  04/21/2012 59 (L)  Final   GFR calc Af Amer  Date Value Ref Range Status  03/30/2020 100 >59 mL/min/1.73 Final    Comment:    **In accordance with recommendations from the NKF-ASN Task force,**   [facility] is in the process of updating its  eGFR calculation to the   2021 CKD-EPI creatinine equation that estimates kidney function   without a race variable.    EGFR (Non-African Amer.)  Date Value Ref Range Status  04/21/2012 51 (L)  Final    Comment:    eGFR values <90m/min/1.73 m2 may be an indication of chronic kidney disease (CKD). Calculated eGFR is useful in patients with stable renal function. The eGFR calculation will not be reliable in acutely ill patients when serum creatinine is changing rapidly. It is not useful in  patients on dialysis. The eGFR calculation may not be applicable to patients at the low and high extremes of body sizes, pregnant women, and vegetarians.    GFR, Estimated  Date Value Ref Range Status  06/23/2021 >60 >60 mL/min Final    Comment:    (NOTE) Calculated using the CKD-EPI Creatinine Equation (2021)    eGFR  Date Value Ref Range Status  10/15/2021 82 > OR = 60 mL/min/1.711mFinal         Passed - Valid encounter within last 6 months    Recent  Outpatient Visits           2 weeks ago Type 2 diabetes mellitus with other specified complication, without long-term current use of insulin ([facility]   2 months ago Essential hypertension   [place]   2 months ago Type 2 diabetes mellitus with other specified complication, [facility] long-term [facility] use [facility]   4 months ago Right groin pain   [place]   6 months ago Screening for colon cancer   [place]       Future Appointments             In 1 week BaGarn[facility]Re[facility]oral[facility] Ke[facility] In 10 months [place]ollice [place]

## 2021-12-30 ENCOUNTER — Encounter: Payer: Medicare HMO | Admitting: Internal Medicine

## 2022-01-01 ENCOUNTER — Ambulatory Visit (INDEPENDENT_AMBULATORY_CARE_PROVIDER_SITE_OTHER): Payer: Medicare HMO

## 2022-01-01 ENCOUNTER — Telehealth: Payer: Medicare HMO

## 2022-01-01 DIAGNOSIS — E7849 Other hyperlipidemia: Secondary | ICD-10-CM

## 2022-01-01 DIAGNOSIS — E1169 Type 2 diabetes mellitus with other specified complication: Secondary | ICD-10-CM

## 2022-01-01 NOTE — Patient Instructions (Signed)
Please call the care guide team at [phone] if you need to cancel or reschedule your appointment.   If you are experiencing a Mental Health or [place] or need someone to talk to, please call the Suicide and Crisis Lifeline: 988 call the [place] National Suicide Prevention Lifeline: 9035615469 or TTY: (765)636-6548 TTY [phone]) to talk to a trained counselor call 1-800-273-TALK (toll free, 24 hour hotline)   Following is a copy of your full provider care plan:   Goals Addressed             This Visit's Progress    CCM Expected Outcome:  Monitor, Self-Manage and Reduce Symptoms of Diabetes       Current Barriers:  Knowledge Deficits related to the significance of controlling blood sugars and keeping A1C levels down Care Coordination needs related to medication cost constraints  in a patient with DM Chronic Disease Management support and education needs related to effective management of DM Financial Constraints.   Planned Interventions: Provided education to patient about basic DM disease process; Reviewed medications with patient and discussed importance of medication adherence;        Reviewed prescribed diet with patient heart healthy/Ada diet. Discussed the patient monitoring his dietary intake especially with the holidays. The patient admits sweets are his weakness. Education and support given; Counseled on importance of regular laboratory monitoring as prescribed;        Discussed plans with patient for ongoing care management follow up and provided patient with direct contact information for care management team;      Provided patient with written educational materials related to hypo and hyperglycemia and importance of correct treatment. The patient states the highest his blood sugars has been is 203 and the lowest it has been is 89. The patient denies any sx and sx of hypo or hyperglycemia. Review and education provided. States he does notice his blood  sugar level dropping after he has exercised. Education provided.        Reviewed scheduled/upcoming provider appointments including: 01-07-2022 at 0920 am- reminder given today ;         Advised patient, providing education and rationale, to check cbg twice daily and when you have symptoms of low or high blood sugar and record. The patient is taking his blood sugars. States the range is 121 to 168 and sometimes up to 203. Review of the goal of fasting is <130 and post prandial of <180.      call provider for findings outside established parameters;       Referral made to pharmacy team for assistance with assistance with cost constraints related to [place] and inability to pay for refills. The patient knows the pharm D is scheduled to call the patient on 01-10-2022 at 9 am;       Review of patient status, including review of consultants reports, relevant laboratory and other test results, and medications completed;       Advised patient to discuss changes in DM health and well being with provider;      Screening for signs and symptoms of depression related to chronic disease state;        Assessed social determinant of health barriers;         Symptom Management: Take medications as prescribed   Attend all scheduled provider appointments Call provider office for new concerns or questions  call the Suicide and Crisis Lifeline: 988 call the [place] National Suicide Prevention Lifeline: 435-597-2358 or TTY: [phone] TTY 8257105993)  to talk to a trained counselor call 1-800-273-TALK (toll free, 24 hour hotline) if experiencing a Mental Health or [place]  check feet daily for cuts, sores or redness trim toenails straight across manage portion size wash and dry feet carefully every day wear comfortable, cotton socks wear comfortable, well-fitting shoes  Follow Up Plan: Telephone follow up appointment with care management team member scheduled for: 02-21-2022 at 145 pm        CCM Expected Outcome:  Monitor, Self-Manage and Reduce Symptoms of: HLD       Current Barriers:  Chronic Disease Management support and education needs related to effective management of HLD  Planned Interventions: Provider established cholesterol goals reviewed; Counseled on importance of regular laboratory monitoring as prescribed; Provided HLD educational materials; Reviewed role and benefits of statin for ASCVD risk reduction; Discussed strategies to manage statin-induced myalgias; Reviewed importance of limiting foods high in cholesterol; Reviewed exercise goals and target of 150 minutes per week; Screening for signs and symptoms of depression related to chronic disease state;  Assessed social determinant of health barriers;   Symptom Management: Take medications as prescribed   Attend all scheduled provider appointments Call provider office for new concerns or questions  call the Suicide and Crisis Lifeline: 988 call the [place] National Suicide Prevention Lifeline: 878-126-9788 or TTY: [phone] TTY [phone]) to talk to a trained counselor call 1-800-273-TALK (toll free, 24 hour hotline) if experiencing a Mental Health or [place]  - call for medicine refill 2 or 3 days before it runs out - take all medications exactly as prescribed - call doctor with any symptoms you believe are related to your medicine - call doctor when you experience any new symptoms - go to all doctor appointments as scheduled - adhere to prescribed diet: heart healthy/ADA diet   Follow Up Plan: Telephone follow up appointment with care management team member scheduled for: 02-21-2022 at 145 pm          Patient verbalizes understanding of instructions and care plan provided today and agrees to view in [place]. Active MyChart status and patient understanding of how to access instructions and care plan via MyChart confirmed with patient.     Telephone follow up appointment with  care management team member scheduled for: 02-21-2022 at 145 pm

## 2022-01-01 NOTE — Plan of Care (Signed)
Chronic Care Management Provider Comprehensive Care Plan    01/01/2022 Name: Daniel Bradley. MRN: [id number] DOB: 08-Jan-1956  Referral to Chronic Care Management (CCM) services was placed by Provider:  Webb Silversmith, NP on Date: 12-06-2021.  Chronic Condition 1: DM Provider Assessment and Plan I instructed him how to use his Tresiba injections, we did his first injection today Continue metformin and glipizide Reinforced low-carb diet and exercise weight loss Advised him to monitor fasting sugars and update me with these readings in 2 weeks   Expected Outcome/Goals Addressed This Visit (Provider CCM goals/Provider Assessment and plan   CCM (DIABETES) EXPECTED OUTCOME: MONITOR, SELF-MANAGE AND REDUCE SYMPTOMS OF DIABETES  Symptom Management Condition 1: Take all medications as prescribed Attend all scheduled provider appointments Call provider office for new concerns or questions  call the Suicide and Crisis Lifeline: 988 call the [place] National Suicide Prevention Lifeline: 516-198-1330 or TTY: 509-068-0838 TTY [phone]) to talk to a trained counselor call 1-800-273-TALK (toll free, 24 hour hotline) if experiencing a Mental Health or [place]  check feet daily for cuts, sores or redness trim toenails straight across manage portion size wash and dry feet carefully every day wear comfortable, cotton socks wear comfortable, well-fitting shoes  Chronic Condition 2: HLD Provider Assessment and Plan C-Met and lipid profile today    Expected Outcome/Goals Addressed This Visit (Provider CCM goals/Provider Assessment and plan   Goal: CCM (HLD)  EXPECTED OUTCOME:  MONITOR, SELF- MANAGE AND REDUCE SYMPTOMS OF HLD   Symptom Management Condition 2: Take all medications as prescribed Attend all scheduled provider appointments Call provider office for new concerns or questions  call the Suicide and Crisis Lifeline: 988 call the [place] National Suicide Prevention  Lifeline: (409) 251-6667 or TTY: (939) 090-0083 TTY [phone]) to talk to a trained counselor call 1-800-273-TALK (toll free, 24 hour hotline) if experiencing a Mental Health or [place]  take all medications exactly as prescribed call doctor with any symptoms you believe are related to your medicine call doctor when you experience any new symptoms go to all doctor appointments as scheduled adhere to prescribed diet: heart healthy/ADA  Problem List Patient Active Problem List   Diagnosis Date Noted   Osteoarthritis of right hip 10/01/2021   Chronic low back pain 03/19/2021   History of prostate cancer 12/17/2020   OSA (obstructive sleep apnea) 12/17/2020   Class 2 obesity due to excess calories with body mass index (BMI) of 37.0 to 37.9 in adult 12/17/2020   Aortic atherosclerosis ([place]) 12/17/2020   Diabetes mellitus ([place]) 09/09/2018   Essential hypertension 09/09/2018   Coronary artery disease 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 Swallow whole., Disp: , Rfl:    blood glucose meter kit and supplies, Dispense based on patient and insurance preference. Use to check blood sugar daily as directed. DX Ell.9, Disp: 1 each, Rfl: 0   Cholecalciferol 125 MCG (5000 UT) capsule, Take by mouth., Disp: , Rfl:    doxylamine, Sleep, (UNISOM) 25 MG tablet, Take 25 mg by mouth at bedtime as needed., Disp: , Rfl:    glipiZIDE (GLUCOTROL) 10 MG tablet, TAKE 1 TABLET(10 MG) BY MOUTH TWICE DAILY BEFORE A MEAL, Disp: 180 tablet, Rfl: 0   insulin  degludec (TRESIBA) 100 UNIT/ML FlexTouch Pen, Inject 10 Units into the skin at bedtime. (Patient not taking: Reported on 01/01/2022), Disp: 9  mL, Rfl: 0   Insulin Pen Needle 31G X 5 MM MISC, BD Pen Needles- brand specific Inject insulin via insulin pen 6 x daily, Disp: 90 each, Rfl: 0   metFORMIN (GLUCOPHAGE) 850 MG tablet, TAKE 1 TABLET(850 MG) BY MOUTH TWICE DAILY WITH A MEAL, Disp: 180 tablet, Rfl: 0   metoprolol tartrate (LOPRESSOR) 25 MG tablet, Take 1 tablet (25 mg total) by mouth 2 (two) times daily., Disp: 60 tablet, Rfl: 2   rosuvastatin (CRESTOR) 20 MG tablet, TAKE 1 TABLET(20 MG) BY MOUTH DAILY, Disp: 90 tablet, Rfl: 3   senna (SENOKOT) 8.6 MG tablet, Take 2 tablets by mouth daily., Disp: , Rfl:   Cognitive Assessment Identity Confirmed: : Name; DOB Cognitive Status: Normal   Functional Assessment Hearing Difficulty or Deaf: no Wear Glasses or Blind: yes Vision Management: eye glasses Concentrating, Remembering or Making Decisions Difficulty (CP): no Difficulty Communicating: no Difficulty Eating/Swallowing: no Walking or Climbing Stairs Difficulty: no Dressing/Bathing Difficulty: no Doing Errands Independently Difficulty (such as shopping) (CP): no Change in Functional Status Since Onset of Current Illness/Injury: no   Caregiver Assessment  Primary Source of Support/Comfort: significant other Name of Support/Comfort Primary Source: April- significant other People in Home: significant other Name(s) of People in Home: April McDonald- his significant other Family Caregiver if Needed: none Primary Roles/Responsibilities: retired Concerns About Impact on Relationships: did talk to the patient about making his girlfriend his DRP- education provided   Planned Interventions  Provided education to patient about basic DM disease process; Reviewed medications with patient and discussed importance of medication adherence;        Reviewed prescribed diet with patient heart healthy/Ada diet. Discussed the patient monitoring his dietary intake especially with the holidays. The patient admits sweets are his weakness.  Education and support given; Counseled on importance of regular laboratory monitoring as prescribed;        Discussed plans with patient for ongoing care management follow up and provided patient with direct contact information for care management team;      Provided patient with written educational materials related to hypo and hyperglycemia and importance of correct treatment. The patient states the highest his blood sugars has been is 203 and the lowest it has been is 89. The patient denies any sx and sx of hypo or hyperglycemia. Review and education provided. States he does notice his blood sugar level dropping after he has exercised. Education provided.        Reviewed scheduled/upcoming provider appointments including: 01-07-2022 at 0920 am- reminder given today ;         Advised patient, providing education and rationale, to check cbg twice daily and when you have symptoms of low or high blood sugar and record. The patient is taking his blood sugars. States the range is 121 to 168 and sometimes up to 203. Review of the goal of fasting is <130 and post prandial of <180.      call provider for findings outside established parameters;       Referral made to pharmacy team for assistance with assistance with cost constraints related to [place] and inability to pay for refills. The patient knows the pharm D is scheduled to call the patient on 01-10-2022 at 9 am;       Review of patient status, including review of consultants reports, relevant laboratory and other test results,  and medications completed;       Advised patient to discuss changes in DM health and well being with provider;      Screening for signs and symptoms of depression related to chronic disease state;        Assessed social determinant of health barriers;        Provider established cholesterol goals reviewed; Counseled on importance of regular laboratory monitoring as prescribed; Provided HLD educational materials; Reviewed role  and benefits of statin for ASCVD risk reduction; Discussed strategies to manage statin-induced myalgias; Reviewed importance of limiting foods high in cholesterol; Reviewed exercise goals and target of 150 minutes per week; Screening for signs and symptoms of depression related to chronic disease state;  Assessed social determinant of health barriers;    Interaction and coordination with outside resources, practitioners, and providers See CCM Referral  Care Plan: Available in MyChart

## 2022-01-01 NOTE — Chronic Care Management (AMB) (Signed)
Chronic Care Management   CCM RN Visit Note  01/01/2022 Name: Daniel Bradley. MRN: [id number] DOB: Sep 22, 1955  Subjective: Daniel Bradley. is a 66 y.o. year old male who is a primary care patient of Daniel Fenton, NP. The patient was referred to the Chronic Care Management team for assistance with care management needs subsequent to provider initiation of CCM services and plan of care.    Today's Visit:  Engaged with patient by telephone for initial visit.     SDOH Interventions Today    Flowsheet Row Most Recent Value  SDOH Interventions   Food Insecurity Interventions Intervention Not Indicated  Housing Interventions Intervention Not Indicated  Transportation Interventions Intervention Not Indicated  Utilities Interventions Intervention Not Indicated  Alcohol Usage Interventions Intervention Not Indicated (Score <7)  Financial Strain Interventions Other (Comment)  [needs assistance with paying for Daniel Bradley D consult pending]  Physical Activity Interventions Intervention Not Indicated, Other (Comments)  [goes to the gym, 3 or 4 days a week, does walking, bench presses, weights, and knee presses]  Stress Interventions Intervention Not Indicated  Social Connections Interventions Intervention Not Indicated, Other (Comment)  [lives with his girlfriend Daniel Bradley, the patient has been divorced 3 times, good support system, has 2 sons]         Goals Addressed             This Visit's Progress    CCM Expected Outcome:  Monitor, Self-Manage and Reduce Symptoms of Diabetes       Current Barriers:  Knowledge Deficits related to the significance of controlling blood sugars and keeping A1C levels down Care Coordination needs related to medication cost constraints  in a patient with DM Chronic Disease Management support and education needs related to effective management of DM Financial Constraints.   Planned Interventions: Provided education to patient about basic DM  disease process; Reviewed medications with patient and discussed importance of medication adherence;        Reviewed prescribed diet with patient heart healthy/Ada diet. Discussed the patient monitoring his dietary intake especially with the holidays. The patient admits sweets are his weakness. Education and support given; Counseled on importance of regular laboratory monitoring as prescribed;        Discussed plans with patient for ongoing care management follow up and provided patient with direct contact information for care management team;      Provided patient with written educational materials related to hypo and hyperglycemia and importance of correct treatment. The patient states the highest his blood sugars has been is 203 and the lowest it has been is 89. The patient denies any sx and sx of hypo or hyperglycemia. Review and education provided. States he does notice his blood sugar level dropping after he has exercised. Education provided.        Reviewed scheduled/upcoming provider appointments including: 01-07-2022 at 0920 am- reminder given today ;         Advised patient, providing education and rationale, to check cbg twice daily and when you have symptoms of low or high blood sugar and record. The patient is taking his blood sugars. States the range is 121 to 168 and sometimes up to 203. Review of the goal of fasting is <130 and post prandial of <180.      call provider for findings outside established parameters;       Referral made to pharmacy team for assistance with assistance with cost constraints related to [place] and inability to pay for refills. The  patient knows the pharm D is scheduled to call the patient on 01-10-2022 at 9 am;       Review of patient status, including review of consultants reports, relevant laboratory and other test results, and medications completed;       Advised patient to discuss changes in DM health and well being with provider;      Screening for signs and  symptoms of depression related to chronic disease state;        Assessed social determinant of health barriers;         Symptom Management: Take medications as prescribed   Attend all scheduled provider appointments Call provider office for new concerns or questions  call the Suicide and Crisis Lifeline: 988 call the [place] National Suicide Prevention Lifeline: 931-606-9295 or TTY: 620-140-4510 TTY (518) 503-6153) to talk to a trained counselor call 1-800-273-TALK (toll free, 24 hour hotline) if experiencing a Mental Health or [place]  check feet daily for cuts, sores or redness trim toenails straight across manage portion size wash and dry feet carefully every day wear comfortable, cotton socks wear comfortable, well-fitting shoes  Follow Up Plan: Telephone follow up appointment with care management team member scheduled for: 02-21-2022 at 145 pm       CCM Expected Outcome:  Monitor, Self-Manage and Reduce Symptoms of: HLD       Current Barriers:  Chronic Disease Management support and education needs related to effective management of HLD  Planned Interventions: Provider established cholesterol goals reviewed; Counseled on importance of regular laboratory monitoring as prescribed; Provided HLD educational materials; Reviewed role and benefits of statin for ASCVD risk reduction; Discussed strategies to manage statin-induced myalgias; Reviewed importance of limiting foods high in cholesterol; Reviewed exercise goals and target of 150 minutes per week; Screening for signs and symptoms of depression related to chronic disease state;  Assessed social determinant of health barriers;   Symptom Management: Take medications as prescribed   Attend all scheduled provider appointments Call provider office for new concerns or questions  call the Suicide and Crisis Lifeline: 988 call the [place] National Suicide Prevention Lifeline: (986)458-0186 or TTY: 737-726-0982 TTY  7246696944) to talk to a trained counselor call 1-800-273-TALK (toll free, 24 hour hotline) if experiencing a Mental Health or [place]  - call for medicine refill 2 or 3 days before it runs out - take all medications exactly as prescribed - call doctor with any symptoms you believe are related to your medicine - call doctor when you experience any new symptoms - go to all doctor appointments as scheduled - adhere to prescribed diet: heart healthy/ADA diet   Follow Up Plan: Telephone follow up appointment with care management team member scheduled for: 02-21-2022 at 145 pm          Plan:Telephone follow up appointment with care management team member scheduled for:  02-21-2022 at 145 pm  Noreene Larsson RN, MSN, CCM RN Care Manager  Chronic Care Management Direct Number: [phone]

## 2022-01-07 ENCOUNTER — Ambulatory Visit (INDEPENDENT_AMBULATORY_CARE_PROVIDER_SITE_OTHER): Payer: Medicare HMO | Admitting: Internal Medicine

## 2022-01-07 ENCOUNTER — Encounter: Payer: Self-pay | Admitting: Internal Medicine

## 2022-01-07 VITALS — BP 146/80 | HR 89 | Temp 96.1°F | Ht 73.0 in | Wt 280.0 lb

## 2022-01-07 DIAGNOSIS — Z0001 Encounter for general adult medical examination with abnormal findings: Secondary | ICD-10-CM

## 2022-01-07 DIAGNOSIS — E119 Type 2 diabetes mellitus without complications: Secondary | ICD-10-CM

## 2022-01-07 DIAGNOSIS — I10 Essential (primary) hypertension: Secondary | ICD-10-CM

## 2022-01-07 DIAGNOSIS — Z1159 Encounter for screening for other viral diseases: Secondary | ICD-10-CM | POA: Diagnosis not present

## 2022-01-07 DIAGNOSIS — Z6836 Body mass index (BMI) 36.0-36.9, adult: Secondary | ICD-10-CM | POA: Diagnosis not present

## 2022-01-07 MED ORDER — AMLODIPINE BESYLATE 10 MG PO TABS: 10.0000 mg | ORAL_TABLET | Freq: Every day | ORAL | 0 refills | Status: DC

## 2022-01-07 MED ORDER — GLIPIZIDE 10 MG PO TABS
ORAL_TABLET | ORAL | 0 refills | Status: DC
Start: 2022-01-07 — End: 2022-04-08

## 2022-01-07 MED ORDER — METOPROLOL TARTRATE 25 MG PO TABS
25.0000 mg | ORAL_TABLET | Freq: Two times a day (BID) | ORAL | 0 refills | Status: DC
Start: 2022-01-07 — End: 2022-04-08

## 2022-01-07 NOTE — Progress Notes (Signed)
Subjective:    Patient ID: Daniel Blase., male    DOB: Nov 14, 1955, 66 y.o.   MRN: [id number]  HPI  Patient presents to clinic today for his annual exam.  Of note, his BP today is 164/78.  He is not taking Metoprolol as prescribed.  Flu: 10/2021 Tetanus: 02/2010 COVID: X3 Pneumovax: Never Prevnar 20: 12/2020 Shingrix: Never PSA screening: 11/2021 Colon screening: 09/2021 Vision screening: Annually Dentist: biannually  Diet: He does eat meat. He consumes fruits and veggies. He tries to avoid fried foods. He drinks mostly Dt. 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 Swallow whole.     blood glucose meter kit and supplies Dispense based on patient and insurance preference. Use to check blood sugar daily as directed. DX Ell.9 1 each 0   Cholecalciferol 125 MCG (5000 UT) capsule Take by mouth.     doxylamine, Sleep, (UNISOM) 25 MG tablet Take 25 mg by mouth at bedtime as needed.     glipiZIDE (GLUCOTROL) 10 MG tablet TAKE 1 TABLET(10 MG) BY MOUTH TWICE DAILY BEFORE A MEAL 180 tablet 0   insulin degludec (TRESIBA) 100 UNIT/ML FlexTouch Pen Inject 10 Units into the skin at bedtime.  (Patient not taking: Reported on 01/01/2022) 9 mL 0   Insulin Pen Needle 31G X 5 MM MISC BD Pen Needles- brand specific Inject insulin via insulin pen 6 x daily 90 each 0   metFORMIN (GLUCOPHAGE) 850 MG tablet TAKE 1 TABLET(850 MG) BY MOUTH TWICE DAILY WITH A MEAL 180 tablet 0   metoprolol tartrate (LOPRESSOR) 25 MG tablet Take 1 tablet (25 mg total) by mouth 2 (two) times daily. 60 tablet 2   rosuvastatin (CRESTOR) 20 MG tablet TAKE 1 TABLET(20 MG) BY MOUTH DAILY 90 tablet 3   senna (SENOKOT) 8.6 MG tablet Take 2 tablets by mouth daily.     No current facility-administered medications for this visit.    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    Employer: [employer]    Comment: retired  Tobacco Use  Smoking status: Never   Smokeless tobacco: Never  Vaping Use   Vaping Use: Never used  Substance and Sexual Activity   Alcohol use: No    Alcohol/week: 0.0 standard drinks of alcohol    Comment: rare   Drug use: No   Sexual activity: Not Currently  Other Topics Concern   Not on file  Social History Narrative   Lives with girlfriend, April   Social Determinants of Health   Financial Resource Strain: Low Risk  (01/01/2022)   Overall Financial Resource Strain (CARDIA)    Difficulty of Paying Living Expenses: Not very hard  Food Insecurity: No Food Insecurity (01/01/2022)   Hunger Vital Sign    Worried About Running Out of Food in the Last Year: Never true    Ran Out of Food in the Last Year: Never true  Transportation Needs: No Transportation Needs (01/01/2022)   PRAPARE  - Hydrologist (Medical): No    Lack of Transportation (Non-Medical): No  Physical Activity: Sufficiently Active (01/01/2022)   Exercise Vital Sign    Days of Exercise per Week: 4 days    Minutes of Exercise per Session: 80 min  Stress: No Stress Concern Present (01/01/2022)   [place]    Feeling of Stress : Only a little  Social Connections: Moderately Integrated (01/01/2022)   Social Connection and Isolation Panel [NHANES]    Frequency of Communication with Friends and Family: More than three times a week    Frequency of Social Gatherings with Friends and Family: Once a week    Attends Religious Services: 1 to 4 times per year    Active Member of [employer] or Organizations: No    Attends Archivist Meetings: Never    Marital Status: Living with partner  Intimate Partner Violence: Not At Risk (01/01/2022)   Humiliation, Afraid, Rape, and Kick questionnaire    Fear of Current or Ex-Partner: No    Emotionally Abused: No    Physically Abused: No    Sexually Abused: No     Constitutional: Denies fever, malaise, fatigue, headache or abrupt weight changes.  HEENT: Denies eye pain, eye redness, ear pain, ringing in the ears, wax buildup, runny nose, nasal congestion, bloody nose, or sore throat. Respiratory: Denies difficulty breathing, shortness of breath, cough or sputum production.   Cardiovascular: Denies chest pain, chest tightness, palpitations or swelling in the hands or feet.  Gastrointestinal: Denies abdominal pain, bloating, constipation, diarrhea or blood in the stool.  GU: Patient has a history of erectile dysfunction.  Denies urgency, frequency, pain with urination, burning sensation, blood in urine, odor or discharge. Musculoskeletal: Patient reports joint pain.  Denies decrease in range of motion, difficulty with gait, muscle pain or joint swelling.  Skin: Denies redness,  rashes, lesions or ulcercations.  Neurological: Pt reports numbness in feet. Denies dizziness, difficulty with memory, difficulty with speech or problems with balance and coordination.  Psych: Denies anxiety, depression, SI/HI.  No other specific complaints in a complete review of systems (except as listed in HPI above).  Objective:   Physical Exam  BP (!) 146/80 (BP Location: Right Arm, Patient Position: Sitting, Cuff Size: Large)   Pulse 89   Temp (!) 96.1 F (35.6 C) (Temporal)   Ht _0  (1.854 m)   Wt 280 lb (127 kg)   SpO2 98%   BMI 36.94 kg/m   Wt Readings from Last 3 Encounters:  12/09/21 285  lb (129.3 kg)  11/19/21 286 lb (129.7 kg)  10/15/21 289 lb (131.1 kg)    General: Appears their stated age, obese, in NAD. Skin: Warm, dry and intact. No ulcerations noted. HEENT: Head: normal shape and size; Eyes: sclera white, no icterus, conjunctiva pink, PERRLA and EOMs intact;  Neck:  Neck supple, trachea midline. No masses, lumps or thyromegaly present.  Cardiovascular: Normal rate and rhythm. S1,S2 noted.  No murmur, rubs or gallops noted. No JVD or BLE edema. No carotid bruits noted. Pulmonary/Chest: Normal effort and positive vesicular breath sounds. No respiratory distress. No wheezes, rales or ronchi noted.  Abdomen: Soft and nontender. Normal bowel sounds.  Musculoskeletal: Strength 5/5 BUE/BLE.  No difficulty with gait.  Neurological: Alert and oriented. Cranial nerves II-XII grossly intact. Coordination normal.  Psychiatric: Mood and affect normal. Behavior is normal. Judgment and thought content normal.    BMET    Component Value Date/Time   NA 141 10/15/2021 1534   NA 139 03/30/2020 1015   NA 139 04/21/2012 0641   K 4.6 10/15/2021 1534   K 3.8 04/21/2012 0641   CL 104 10/15/2021 1534   CL 107 04/21/2012 0641   CO2 24 10/15/2021 1534   CO2 27 04/21/2012 0641   GLUCOSE 83 10/15/2021 1534   GLUCOSE 212 (H) 04/21/2012 0641   BUN 19 10/15/2021 1534   BUN 14  03/30/2020 1015   BUN 22 (H) 04/21/2012 0641   CREATININE 1.02 10/15/2021 1534   CALCIUM 9.9 10/15/2021 1534   CALCIUM 8.7 04/21/2012 0641   GFRNONAA >60 06/23/2021 2133   GFRNONAA 51 (L) 04/21/2012 0641   GFRAA 100 03/30/2020 1015   GFRAA 59 (L) 04/21/2012 0641    Lipid Panel     Component Value Date/Time   CHOL 229 (H) 10/15/2021 1534   CHOL 128 03/30/2020 1015   TRIG 153 (H) 10/15/2021 1534   HDL 49 10/15/2021 1534   HDL 41 03/30/2020 1015   CHOLHDL 4.7 10/15/2021 1534   VLDL 14 08/20/2017 0359   LDLCALC 152 (H) 10/15/2021 1534    CBC    Component Value Date/Time   WBC 6.5 10/15/2021 1534   RBC 5.44 10/15/2021 1534   HGB 13.9 10/15/2021 1534   HGB 14.0 03/30/2020 1015   HCT 43.4 10/15/2021 1534   HCT 42.5 03/30/2020 1015   PLT 321 10/15/2021 1534   PLT 306 03/30/2020 1015   MCV 79.8 (L) 10/15/2021 1534   MCV 79 03/30/2020 1015   MCV 80 04/21/2012 0641   MCH 25.6 (L) 10/15/2021 1534   MCHC 32.0 10/15/2021 1534   RDW 14.8 10/15/2021 1534   RDW 14.6 03/30/2020 1015   RDW 14.9 (H) 04/21/2012 0641   LYMPHSABS 1.8 03/30/2020 1015   MONOABS 0.5 04/03/2019 0757   EOSABS 0.3 03/30/2020 1015   BASOSABS 0.1 03/30/2020 1015    Hgb A1C Lab Results  Component Value Date   HGBA1C 10.9 (A) 10/01/2021            Assessment & Plan:   Preventative Health Maintenance:  Flu shot UTD Tetanus UTD Encouraged him to get his COVID booster Prevnar 20 UTD, will not need Pneumovax Discussed Shingrix vaccine, he will check coverage with his insurance company and schedule a visit at the pharmacy if he would like to have this done Colon screening UTD Encouraged him to consume a balanced diet and exercise regimen Advised him to see an eye doctor and dentist annually We will check CBC, c-Met, lipid, A1c, hep C  and urine microalbumin today  RTC in 2 weeks, follow-up HTN 3 months, follow-up chronic conditions Webb Silversmith, NP

## 2022-01-07 NOTE — Assessment & Plan Note (Signed)
Uncontrolled off meds We will restart metoprolol We will start amlodipine 10 mg daily Reinforced DASH diet and exercise for weight loss C-Met today

## 2022-01-07 NOTE — Assessment & Plan Note (Signed)
Encourage diet and exercise for weight loss 

## 2022-01-07 NOTE — Patient Instructions (Signed)
Health Maintenance After Age 65 After age 65, you are at a higher risk for certain long-term diseases and infections as well as injuries from falls. Falls are a major cause of broken bones and head injuries in people who are older than age 65. Getting regular preventive care can help to keep you healthy and well. Preventive care includes getting regular testing and making lifestyle changes as recommended by your health care provider. Talk with your health care provider about: Which screenings and tests you should have. A screening is a test that checks for a disease when you have no symptoms. A diet and exercise plan that is right for you. What should I know about screenings and tests to prevent falls? Screening and testing are the best ways to find a health problem early. Early diagnosis and treatment give you the best chance of managing medical conditions that are common after age 65. Certain conditions and lifestyle choices may make you more likely to have a fall. Your health care provider may recommend: Regular vision checks. Poor vision and conditions such as cataracts can make you more likely to have a fall. If you wear glasses, make sure to get your prescription updated if your vision changes. Medicine review. Work with your health care provider to regularly review all of the medicines you are taking, including over-the-counter medicines. Ask your health care provider about any side effects that may make you more likely to have a fall. Tell your health care provider if any medicines that you take make you feel dizzy or sleepy. Strength and balance checks. Your health care provider may recommend certain tests to check your strength and balance while standing, walking, or changing positions. Foot health exam. Foot pain and numbness, as well as not wearing proper footwear, can make you more likely to have a fall. Screenings, including: Osteoporosis screening. Osteoporosis is a condition that causes  the bones to get weaker and break more easily. Blood pressure screening. Blood pressure changes and medicines to control blood pressure can make you feel dizzy. Depression screening. You may be more likely to have a fall if you have a fear of falling, feel depressed, or feel unable to do activities that you used to do. Alcohol use screening. Using too much alcohol can affect your balance and may make you more likely to have a fall. Follow these instructions at home: Lifestyle Do not drink alcohol if: Your health care provider tells you not to drink. If you drink alcohol: Limit how much you have to: 0-1 drink a day for women. 0-2 drinks a day for men. Know how much alcohol is in your drink. In the [place] one drink equals one 12 oz bottle of beer (355 mL), one 5 oz glass of wine (148 mL), or one 1 oz glass of hard liquor (44 mL). Do not use any products that contain nicotine or tobacco. These products include cigarettes, chewing tobacco, and vaping devices, such as e-cigarettes. If you need help quitting, ask your health care provider. Activity  Follow a regular exercise program to stay fit. This will help you maintain your balance. Ask your health care provider what types of exercise are appropriate for you. If you need a cane or walker, use it as recommended by your health care provider. Wear supportive shoes that have nonskid soles. Safety  Remove any tripping hazards, such as rugs, cords, and clutter. Install safety equipment such as grab bars in bathrooms and safety rails on stairs. Keep rooms and walkways   well-lit. General instructions Talk with your health care provider about your risks for falling. Tell your health care provider if: You fall. Be sure to tell your health care provider about all falls, even ones that seem minor. You feel dizzy, tiredness (fatigue), or off-balance. Take over-the-counter and prescription medicines only as told by your health care provider. These include  supplements. Eat a healthy diet and maintain a healthy weight. A healthy diet includes low-fat dairy products, low-fat (lean) meats, and fiber from whole grains, beans, and lots of fruits and vegetables. Stay current with your vaccines. Schedule regular health, dental, and eye exams. Summary Having a healthy lifestyle and getting preventive care can help to protect your health and wellness after age 65. Screening and testing are the best way to find a health problem early and help you avoid having a fall. Early diagnosis and treatment give you the best chance for managing medical conditions that are more common for people who are older than age 65. Falls are a major cause of broken bones and head injuries in people who are older than age 65. Take precautions to prevent a fall at home. Work with your health care provider to learn what changes you can make to improve your health and wellness and to prevent falls. This information is not intended to replace advice given to you by your health care provider. Make sure you discuss any questions you have with your health care provider. Document Revised: 06/18/2020 Document Reviewed: 06/18/2020 Elsevier Patient Education  2023 [employer]  

## 2022-01-08 LAB — CBC
HCT: 44.2 % (ref 38.5–50.0)
Hemoglobin: 13.9 g/dL (ref 13.2–17.1)
MCH: 25 pg — ABNORMAL LOW (ref 27.0–33.0)
MCHC: 31.4 g/dL — ABNORMAL LOW (ref 32.0–36.0)
MCV: 79.6 fL — ABNORMAL LOW (ref 80.0–100.0)
MPV: 10.2 fL (ref 7.5–12.5)
Platelets: 315 10*3/uL (ref 140–400)
RBC: 5.55 10*6/uL (ref 4.20–5.80)
RDW: 14.5 % (ref 11.0–15.0)
WBC: 6.4 10*3/uL (ref 3.8–10.8)

## 2022-01-08 LAB — MICROALBUMIN / CREATININE URINE RATIO
Creatinine, Urine: 120 mg/dL (ref 20–320)
Microalb Creat Ratio: 8 mcg/mg creat (ref <30)
Microalb, Ur: 0.9 mg/dL

## 2022-01-08 LAB — COMPLETE METABOLIC PANEL WITH GFR
AG Ratio: 1.4 (calc) (ref 1.0–2.5)
ALT: 15 U/L (ref 9–46)
AST: 15 U/L (ref 10–35)
Albumin: 4.4 g/dL (ref 3.6–5.1)
Alkaline phosphatase (APISO): 105 U/L (ref 35–144)
BUN: 19 mg/dL (ref 7–25)
CO2: 27 mmol/L (ref 20–32)
Calcium: 10.1 mg/dL (ref 8.6–10.3)
Chloride: 104 mmol/L (ref 98–110)
Creat: 1 mg/dL (ref 0.70–1.35)
Globulin: 3.1 g/dL (calc) (ref 1.9–3.7)
Glucose, Bld: 152 mg/dL — ABNORMAL HIGH (ref 65–99)
Potassium: 4.5 mmol/L (ref 3.5–5.3)
Sodium: 141 mmol/L (ref 135–146)
Total Bilirubin: 0.4 mg/dL (ref 0.2–1.2)
Total Protein: 7.5 g/dL (ref 6.1–8.1)
eGFR: 83 mL/min/{1.73_m2} (ref >=60)

## 2022-01-08 LAB — LIPID PANEL
Cholesterol: 150 mg/dL (ref <200)
HDL: 49 mg/dL (ref >=40)
LDL Cholesterol (Calc): 83 mg/dL (calc)
Non-HDL Cholesterol (Calc): 101 mg/dL (calc) (ref <130)
Total CHOL/HDL Ratio: 3.1 (calc) (ref <5.0)
Triglycerides: 86 mg/dL (ref <150)

## 2022-01-08 LAB — HEMOGLOBIN A1C
Hgb A1c MFr Bld: 7.7 % of total Hgb — ABNORMAL HIGH (ref <5.7)
Mean Plasma Glucose: 174 mg/dL
eAG (mmol/L): 9.7 mmol/L

## 2022-01-08 LAB — HEPATITIS C ANTIBODY: Hepatitis C Ab: NONREACTIVE

## 2022-01-09 DIAGNOSIS — E7849 Other hyperlipidemia: Secondary | ICD-10-CM

## 2022-01-09 DIAGNOSIS — E1169 Type 2 diabetes mellitus with other specified complication: Secondary | ICD-10-CM

## 2022-01-10 ENCOUNTER — Ambulatory Visit (INDEPENDENT_AMBULATORY_CARE_PROVIDER_SITE_OTHER): Payer: Medicare HMO | Admitting: Pharmacist

## 2022-01-10 DIAGNOSIS — E119 Type 2 diabetes mellitus without complications: Secondary | ICD-10-CM

## 2022-01-10 DIAGNOSIS — I10 Essential (primary) hypertension: Secondary | ICD-10-CM

## 2022-01-10 NOTE — Chronic Care Management (AMB) (Signed)
Chronic Care Management CCM Pharmacy Note  01/10/2022 Name:  Daniel Bradley. MRN:  [id number] DOB:  12/02/1955   Subjective: Daniel Bradley. is an 66 y.o. year old male who is a primary patient of [name], NP.  The CCM team was consulted for assistance with disease management and care coordination needs.    Engaged with patient by telephone for initial visit for pharmacy case management and/or care coordination services.   Objective:  Medications Reviewed Today     Reviewed by Rennis Petty, RPH-CPP (Pharmacist) on 01/10/22 at 1002  Med List Status: <None>   Medication Order Taking? Sig Documenting Provider Last Dose Status Informant  amLODipine (NORVASC) 10 MG tablet [id number] Yes Take 1 tablet (10 mg total) by mouth daily. [name], NP Taking Active   aspirin EC 81 MG tablet [id number] Yes 81 mg daily. Swallow whole. [provider] Taking Active   Aspirin-Salicylamide-Caffeine (BC HEADACHE POWDER PO) [id number]  Take 1 tablet by mouth daily as needed. [provider]  Active   blood glucose meter kit and supplies [id number]  Dispense based on patient and insurance preference. Use to check blood sugar daily as directed. DX Ell.9 [name], NP  Active   Cholecalciferol 125 MCG (5000 UT) capsule [id number] Yes Take 5,000 Units by mouth daily. [provider] Taking Active   doxylamine, Sleep, (UNISOM) 25 MG tablet [id number] Yes Take 25 mg by mouth at bedtime as needed. [provider] Taking Active   glipiZIDE (GLUCOTROL) 10 MG tablet [id number] Yes TAKE 1 TABLET(10 MG) BY MOUTH TWICE DAILY BEFORE A MEAL 11, Coralie Keens, NP Taking Active   metFORMIN (GLUCOPHAGE) 850 MG tablet [id number] Yes TAKE 1 TABLET(850 MG) BY MOUTH TWICE DAILY WITH A MEAL Baity, Coralie Keens, NP Taking Active   metoprolol tartrate (LOPRESSOR) 25 MG tablet [id number] Yes Take 1 tablet (25 mg total) by mouth 2 (two) times daily. [name], NP Taking  Active   rosuvastatin (CRESTOR) 20 MG tablet [id number] Yes TAKE 1 TABLET(20 MG) BY MOUTH DAILY Baity, Coralie Keens, NP Taking Active   senna (SENOKOT) 8.6 MG tablet [id number] No Take 2 tablets by mouth daily.  Patient not taking: Reported on 01/10/2022   [provider] Not Taking Active             Pertinent Labs:  Lab Results  Component Value Date   HGBA1C 7.7 (H) 01/07/2022   Lab Results  Component Value Date   CHOL 150 01/07/2022   HDL 49 01/07/2022   LDLCALC 83 01/07/2022   TRIG 86 01/07/2022   CHOLHDL 3.1 01/07/2022   Lab Results  Component Value Date   CREATININE 1.00 01/07/2022   BUN 19 01/07/2022   NA 141 01/07/2022   K 4.5 01/07/2022   CL 104 01/07/2022   CO2 27 01/07/2022   BP Readings from Last 3 Encounters:  01/07/22 (!) 146/80  12/09/21 134/76  11/19/21 127/83   Pulse Readings from Last 3 Encounters:  01/07/22 89  12/09/21 91  11/19/21 99     SDOH:  (Social Determinants of Health) assessments and interventions performed:  SDOH Interventions    Flowsheet Row Office Visit from 01/07/2022 in [place] [facility] from 01/01/2022 in [facility] Office Visit from 12/09/2021 in [place] from 08/29/2021 in [facility] Office Visit from 03/19/2021 in [facility] Nutrition from 05/12/2016 in [place]  SDOH Interventions        Food Insecurity Interventions -- Intervention Not Indicated -- Intervention Not Indicated -- --  Housing Interventions -- Intervention Not Indicated -- Intervention Not Indicated -- --  Transportation Interventions -- Intervention Not Indicated -- Intervention Not Indicated -- --  Utilities Interventions -- Intervention Not Indicated -- -- -- --  Alcohol Usage Interventions -- Intervention Not Indicated (Score <7) -- -- -- --  Depression Interventions/Treatment  Patient refuses Treatment -- Patient  refuses Treatment PHQ2-9 Score <4 Follow-up Not Indicated Medication Currently on Treatment  [was in counseling and has healthy coping strategies in place]  Financial Strain Interventions -- Other (Comment)  [needs assistance with paying for Tresiba, pharm D consult pending] -- Intervention Not Indicated -- --  Physical Activity Interventions -- Intervention Not Indicated, Other (Comments)  [goes to the gym, 3 or 4 days a week, does walking, bench presses, weights, and knee presses] -- Intervention Not Indicated -- --  Stress Interventions -- Intervention Not Indicated -- Intervention Not Indicated -- --  Social Connections Interventions -- Intervention Not Indicated, Other (Comment)  [lives with his girlfriend April, the patient has been divorced 3 times, good support system, has 2 sons] -- Intervention Not Indicated -- --       CCM Care Plan  Review of patient past medical history, allergies, medications, health status, including review of consultants reports, laboratory and other test data, was performed as part of comprehensive evaluation and provision of chronic care management services.   Care Plan : General Pharmacy (Adult)  Updates made by Rennis Petty, RPH-CPP since 01/10/2022 12:00 AM     Problem: Disease Progression      Long-Range Goal: Disease Progression Prevented or Minimized   Start Date: 01/10/2022  Expected End Date: 04/10/2022  This Visit's Progress: On track  Priority: High  Note:   Current Barriers:  Unable to independently afford treatment regimen Unable to achieve control of A1C Unable to achieve control of blood pressure   Pharmacist Clinical Goal(s):  patient will verbalize ability to afford treatment regimen achieve control of T2DM as evidenced by A1C  through collaboration with PharmD and provider achieve control of HTN as evidenced by blood pressure through collaboration with PharmD and provider.    Interventions: 1:1 collaboration with [name], NP regarding development and update of comprehensive plan of care as evidenced by provider attestation and co-signature Inter-disciplinary care team collaboration (see longitudinal plan of care) Reports takes his medications directly from pill bottles and has a routing to aid with adherence; denies missed doses Denies need to use weekly pillbox at this time Comprehensive medication review performed; medication list updated in electronic medical record Identify patient taking BC powder once-twice daily for back pain. Reports feels like he is taking this out of habit.  Discuss cutting back on use of BC powder Identify patient taking Unisom nightly, but admits not sure if medication helping with sleep Encourage patient to stop using Unisom nightly due to potential side effects, without reported benefit Provide counseling on sleep hygiene  Diabetes: New goal. Uncontrolled based on latest A1C; current treatment: Glipizide 10 mg twice daily before meals Metformin 850 mg twice daily Medications tried in the past: Rybelsus (cost); Ozempic (cost); [name] (cost) Current glucose readings: fasting glucose:  11/15: 112 11/16: 125 11/17: 180 11/18: 165 11/21: 136 11/22: 123 11/24: 206 11/25: 146 11/26: 146 11/27: 142 Denies hypoglycemic symptoms Current meal patterns: breakfast: usually skips; sometimes a egg or [place] sausage; lunch:  Chicken fajita wrap from [place] and sugar free cookie, [employer] cold cut foot long; dinner: baked chicken with mixed veggies (carrots and cauliflower) and fruit (strawberries, blueberries); snacks: cantaloupe, watermelon or apple or sugar free cookies; drinks: Zero-Sugar Mountain Dew (1/2-10 oz bottle/day) and water Current exercise: goes to gym ~80-90 mins walks, chest press, knee press x 3-4 days/week Reviewed long term cardiovascular and renal outcomes of uncontrolled blood sugar Reviewed goal A1c, goal fasting, and goal 2 hour post prandial  glucose Reviewed dietary modifications including: Encourage patient to have regular well-balance meals throughout the day (including breakfast), while controlling carbohydrate portion sizes Discuss lower-carbohydrate snack ideas Encourage patient to review nutrition labels for carbohydrate content of foods Recommend to check glucose, keep a log of the results and have this record to review during future appointments Counsel patient on s/s of low blood sugar and how to treat lows Review rules of 15s - importance of using 15 grams of sugar to treat low, recheck blood sugar in 15 minutes, treat again if remains low or, if back to normal, having meal if mealtime or snack  Review examples of sources of 15 grams of sugar Will mail patient educational handouts as requested Patient interested in restarting Rybelsus, but medication unaffordable through his [facility] Medicare coverage. Based on reported income, patient meets criteria for Rybelsus patient assistance from [employer] collaborate with [facility] CPhT Susy Frizzle and PCP to assist patient with with patient assistance application process  Hypertension:  New goal. Uncontrolled based on latest in office reading; current treatment:  amlodipine 10 mg daily metoprolol 25 mg twice daily Reports has a home wrist blood pressure monitor, but denies monitoring since latest office visit with PCP Denies hypotensive symptoms such as dizziness or lightheadedness Current exercise: goes to gym ~80-90 mins walks, chest press, knee press x 3-4 days/week Encourage patient to obtain an upper arm home blood pressure monitor for greater accuracy of readings.  Counsel patient on using health plan over the counter benefit Reviewed long term cardiovascular and renal outcomes of uncontrolled blood pressure Reviewed appropriate blood pressure monitoring technique and reviewed goal blood pressure.  Recommended to check home blood pressure and heart rate, keep a log of the  results and have this record to review during future appointments   Patient Goals/Self-Care Activities patient will:  - take medications as prescribed as evidenced by patient report and record review - check glucose , document, and provide at future appointments - check blood pressure, document, and provide at future appointments - collaborate with provider on medication access solutions        Plan: Telephone follow up appointment with care management team member scheduled for:  01/24/2022 at 8:30 am  [name], PharmD, [place] [phone]

## 2022-01-10 NOTE — Patient Instructions (Signed)
Visit Information   Thank you for taking time to visit with me today. Please don't hesitate to contact me if I can be of assistance to you before our next scheduled telephone appointment.  Following are the goals we discussed today:   Goals Addressed             This Visit's Progress    Pharmacy Goals       Our goal A1c is less than 7%. This corresponds with fasting sugars less than 130 and 2 hour after meal sugars less than 180. Please keep a log of your results when checking your blood sugar   Our goal bad cholesterol, or LDL, is less than 70 . This is why it is important to continue taking your rosuvastatin.  Check your blood pressure twice weekly, and any time you have concerning symptoms like headache, chest pain, dizziness, shortness of breath, or vision changes.   Our goal is less than 130/80.  To appropriately check your blood pressure, make sure you do the following:  1) Avoid caffeine, exercise, or tobacco products for 30 minutes before checking. Empty your bladder. 2) Sit with your back supported in a flat-backed chair. Rest your arm on something flat (arm of the chair, table, etc). 3) Sit still with your feet flat on the floor, resting, for at least 5 minutes.  4) Check your blood pressure. Take 1-2 readings.  5) Write down these readings and bring with you to any provider appointments.  Bring your home blood pressure machine with you to a provider's office for accuracy comparison at least once a year.   Make sure you take your blood pressure medications before you come to any office visit, even if you were asked to fast for labs.  Please watch the mail for an envelope from [employer] containing the patient assistance program application. Please complete this application and mail back to [place] along with a copy of your Medicare Part D prescription card and a copy of your proof of income document.   Daniel Bradley, PharmD,  [place] [phone]          Our next appointment is by telephone on 01/24/2022 at 8:30 am  Please call the care guide team at [phone] if you need to cancel or reschedule your appointment.    Following is a copy of your full care plan:  Care Plan : General Pharmacy (Adult)  Updates made by Daniel Bradley, RPH-CPP since 01/10/2022 12:00 AM     Problem: Disease Progression      Long-Range Goal: Disease Progression Prevented or Minimized   Start Date: 01/10/2022  Expected End Date: 04/10/2022  This Visit's Progress: On track  Priority: High  Note:   Current Barriers:  Unable to independently afford treatment regimen Unable to achieve control of A1C Unable to achieve control of blood pressure   Pharmacist Clinical Goal(s):  patient will verbalize ability to afford treatment regimen achieve control of T2DM as evidenced by A1C  through collaboration with PharmD and provider achieve control of HTN as evidenced by blood pressure through collaboration with PharmD and provider.    Interventions: 1:1 collaboration with Daniel Fenton, NP regarding development and update of comprehensive plan of care as evidenced by provider attestation and co-signature Inter-disciplinary care team collaboration (see longitudinal plan of care) Reports takes his medications directly from pill bottles and has a routing to aid with adherence; denies missed  doses Denies need to use weekly pillbox at this time Comprehensive medication review performed; medication list updated in electronic medical record Identify patient taking BC powder once-twice daily for back pain. Reports feels like he is taking this out of habit.  Discuss cutting back on use of BC powder Identify patient taking Unisom nightly, but admits not sure if medication helping with sleep Encourage patient to stop using Unisom nightly due to potential side effect,  without reported benefit Provide counseling on sleep hygiene  Diabetes: New goal. Uncontrolled based on latest A1C; current treatment: Glipizide 10 mg twice daily before breakfast and supper Metformin 850 mg twice daily Medications tried in the past: Rybelsus (cost); Ozempic (cost); [name] (cost) Current glucose readings: fasting glucose:  11/15: 112 11/16: 125 11/17: 180 11/18: 165 11/21: 136 11/22: 123 11/24: 206 11/25: 146 11/26: 146 11/27: 142 Denies hypoglycemic symptoms Current meal patterns: breakfast: usually skips; sometimes a egg or [place] sausage; lunch: Chicken fajita wrap from [employer] and sugar free cookie, [employer] cold cut foot long; dinner: baked chicken with mixed veggies (carrots and cauliflower) and fruit (strawberries, blueberries); snacks: cantaloupe, watermelon or apple or sugar free cookies; drinks: Zero-Sugar [employer] (1/2-10 oz bottle/day) and water Current exercise: goes to gym ~80-90 mins walks, chest press, knee press x 3-4 days/week Reviewed long term cardiovascular and renal outcomes of uncontrolled blood sugar Reviewed goal A1c, goal fasting, and goal 2 hour post prandial glucose Reviewed dietary modifications including: Encourage patient to have regular well-balance meals throughout the day (including breakfast), while controlling carbohydrate portion sizes Discuss lower-carbohydrate snack ideas Encourage patient to review nutrition labels for carbohydrate content of foods Recommend to check glucose, keep a log of the results and have this record to review during future appointments Counsel patient on s/s of low blood sugar and how to treat lows Review rules of 15s - importance of using 15 grams of sugar to treat low, recheck blood sugar in 15 minutes, treat again if remains low or, if back to normal, having meal if mealtime or snack  Review examples of sources of 15 grams of sugar Will mail patient educational handouts as requested Patient  interested in restarting Rybelsus, but medication unaffordable through his [facility] Medicare coverage. Based on reported income, patient meets criteria for Rybelsus patient assistance from [employer] collaborate with [facility] CPhT Daniel Bradley and PCP to assist patient with with patient assistance application process  Hypertension:  New goal. Uncontrolled based on latest in office reading; current treatment:  amlodipine 10 mg daily metoprolol 25 mg twice daily Reports has a home wrist blood pressure monitor, but denies monitoring since latest office visit with PCP Denies hypotensive symptoms such as dizziness or lightheadedness Current exercise: goes to gym ~80-90 mins walks, chest press, knee press x 3-4 days/week Encourage patient to obtain an upper arm home blood pressure monitor for greater accuracy of readings.  Counsel patient on using health plan over the counter benefit Reviewed long term cardiovascular and renal outcomes of uncontrolled blood pressure Reviewed appropriate blood pressure monitoring technique and reviewed goal blood pressure.  Recommended to check home blood pressure and heart rate, keep a log of the results and have this record to review during future appointments   Patient Goals/Self-Care Activities patient will:  - take medications as prescribed as evidenced by patient report and record review - check glucose , document, and provide at future appointments - check blood pressure, document, and provide at future appointments - collaborate with provider on medication access  solutions       Consent to CCM Services: Daniel Bradley was given information about Chronic Care Management services including:  CCM service includes personalized support from designated clinical staff supervised by his physician, including individualized plan of care and coordination with other care providers 24/7 contact phone numbers for assistance for urgent and routine care needs. Service  will only be billed when office clinical staff spend 20 minutes or more in a month to coordinate care. Only one practitioner may furnish and bill the service in a calendar month. The patient may stop CCM services at any time (effective at the end of the month) by phone call to the office staff. The patient will be responsible for cost sharing (co-pay) of up to 20% of the service fee (after annual deductible is met).  Patient agreed to services and verbal consent obtained.   Patient verbalizes understanding of instructions and care plan provided today and agrees to view in [place]. Active MyChart status and patient understanding of how to access instructions and care plan via MyChart confirmed with patient.

## 2022-01-13 ENCOUNTER — Telehealth: Payer: Self-pay | Admitting: Pharmacy Technician

## 2022-01-13 DIAGNOSIS — Z596 Low income: Secondary | ICD-10-CM

## 2022-01-13 NOTE — Progress Notes (Signed)
[facility])                                            [place] Team    01/13/2022  Daniel Bradley Sep 29, 1955 [id number]                                      Medication Assistance Referral  Referral From: [facility] Embedded RPh Dorthula Perfect   Medication/Company: Rybelsus / [employer] Patient application portion:  Education officer, museum portion: Faxed  to Webb Silversmith, NP Provider address/fax verified via: [employer]. [name], [place]  [phone]

## 2022-01-24 ENCOUNTER — Ambulatory Visit: Payer: Medicare HMO | Admitting: Pharmacist

## 2022-01-24 DIAGNOSIS — E119 Type 2 diabetes mellitus without complications: Secondary | ICD-10-CM

## 2022-01-24 DIAGNOSIS — I10 Essential (primary) hypertension: Secondary | ICD-10-CM

## 2022-01-24 NOTE — Chronic Care Management (AMB) (Signed)
Chronic Care Management CCM Pharmacy Note  01/24/2022 Name:  Daniel Bradley. MRN:  [id number] DOB:  26-Apr-1955   Subjective: Daniel Bradley. is an 66 y.o. year old male who is a primary patient of [name], NP.  The CCM team was consulted for assistance with disease management and care coordination needs.    Engaged with patient by telephone for follow up visit for pharmacy case management and/or care coordination services.   Objective:  Medications Reviewed Today     Reviewed by Rennis Petty, RPH-CPP (Pharmacist) on 01/10/22 at 1002  Med List Status: <None>   Medication Order Taking? Sig Documenting Provider Last Dose Status Informant  amLODipine (NORVASC) 10 MG tablet [id number] Yes Take 1 tablet (10 mg total) by mouth daily. [name], NP Taking Active   aspirin EC 81 MG tablet [id number] Yes 81 mg daily. Swallow whole. [provider] Taking Active   Aspirin-Salicylamide-Caffeine (BC HEADACHE POWDER PO) [id number]  Take 1 tablet by mouth daily as needed. [provider]  Active   blood glucose meter kit and supplies [id number]  Dispense based on patient and insurance preference. Use to check blood sugar daily as directed. DX Ell.9 [name], NP  Active   Cholecalciferol 125 MCG (5000 UT) capsule [id number] Yes Take 5,000 Units by mouth daily. [provider] Taking Active   doxylamine, Sleep, (UNISOM) 25 MG tablet [id number] Yes Take 25 mg by mouth at bedtime as needed. [provider] Taking Active   glipiZIDE (GLUCOTROL) 10 MG tablet [id number] Yes TAKE 1 TABLET(10 MG) BY MOUTH TWICE DAILY BEFORE A MEAL 37, Coralie Keens, NP Taking Active   metFORMIN (GLUCOPHAGE) 850 MG tablet [id number] Yes TAKE 1 TABLET(850 MG) BY MOUTH TWICE DAILY WITH A MEAL Baity, Coralie Keens, NP Taking Active   metoprolol tartrate (LOPRESSOR) 25 MG tablet [id number] Yes Take 1 tablet (25 mg total) by mouth 2 (two) times daily. [name], NP Taking  Active   rosuvastatin (CRESTOR) 20 MG tablet [id number] Yes TAKE 1 TABLET(20 MG) BY MOUTH DAILY Baity, Coralie Keens, NP Taking Active   senna (SENOKOT) 8.6 MG tablet [id number] No Take 2 tablets by mouth daily.  Patient not taking: Reported on 01/10/2022   [provider] Not Taking Active             Pertinent Labs:  Lab Results  Component Value Date   HGBA1C 7.7 (H) 01/07/2022   Lab Results  Component Value Date   CHOL 150 01/07/2022   HDL 49 01/07/2022   LDLCALC 83 01/07/2022   TRIG 86 01/07/2022   CHOLHDL 3.1 01/07/2022   Lab Results  Component Value Date   CREATININE 1.00 01/07/2022   BUN 19 01/07/2022   NA 141 01/07/2022   K 4.5 01/07/2022   CL 104 01/07/2022   CO2 27 01/07/2022   BP Readings from Last 3 Encounters:  01/07/22 (!) 146/80  12/09/21 134/76  11/19/21 127/83   Pulse Readings from Last 3 Encounters:  01/07/22 89  12/09/21 91  11/19/21 99     SDOH:  (Social Determinants of Health) assessments and interventions performed:  SDOH Interventions    Flowsheet Row Office Visit from 01/07/2022 in [facility] from 01/01/2022 in [facility] Office Visit from 12/09/2021 in [place] from 08/29/2021 in [facility] Office Visit from 03/19/2021 in [facility] from 05/12/2016 in [facility]  SDOH Interventions        Food Insecurity Interventions -- Intervention Not Indicated -- Intervention Not Indicated -- --  Housing Interventions -- Intervention Not Indicated -- Intervention Not Indicated -- --  Transportation Interventions -- Intervention Not Indicated -- Intervention Not Indicated -- --  Utilities Interventions -- Intervention Not Indicated -- -- -- --  Alcohol Usage Interventions -- Intervention Not Indicated (Score <7) -- -- -- --  Depression Interventions/Treatment  Patient refuses Treatment -- Patient  refuses Treatment PHQ2-9 Score <4 Follow-up Not Indicated Medication Currently on Treatment  [was in counseling and has healthy coping strategies in place]  Financial Strain Interventions -- Other (Comment)  [needs assistance with paying for Tresiba, pharm D consult pending] -- Intervention Not Indicated -- --  Physical Activity Interventions -- Intervention Not Indicated, Other (Comments)  [goes to the gym, 3 or 4 days a week, does walking, bench presses, weights, and knee presses] -- Intervention Not Indicated -- --  Stress Interventions -- Intervention Not Indicated -- Intervention Not Indicated -- --  Social Connections Interventions -- Intervention Not Indicated, Other (Comment)  [lives with his girlfriend April, the patient has been divorced 3 times, good support system, has 2 sons] -- Intervention Not Indicated -- --       CCM Care Plan  Review of patient past medical history, allergies, medications, health status, including review of consultants reports, laboratory and other test data, was performed as part of comprehensive evaluation and provision of chronic care management services.   Care Plan : General Pharmacy (Adult)  Updates made by Rennis Petty, RPH-CPP since 01/24/2022 12:00 AM     Problem: Disease Progression      Long-Range Goal: Disease Progression Prevented or Minimized   Start Date: 01/10/2022  Expected End Date: 04/10/2022  Recent Progress: On track  Priority: High  Note:   Current Barriers:  Unable to independently afford treatment regimen Unable to achieve control of A1C Unable to achieve control of blood pressure   Pharmacist Clinical Goal(s):  patient will verbalize ability to afford treatment regimen achieve control of T2DM as evidenced by A1C  through collaboration with PharmD and provider achieve control of HTN as evidenced by blood pressure through collaboration with PharmD and provider.    Interventions: 1:1 collaboration with [name], NP regarding development and update of comprehensive plan of care as evidenced by provider attestation and co-signature Inter-disciplinary care team collaboration (see longitudinal plan of care) Reports takes his medications directly from pill bottles and has a routing to aid with adherence; denies missed doses Denies need to use weekly pillbox at this time Patient reports that he has stopped taking BC powder. Previously reported using once-twice daily out of habit (started using for back pain) Reports doing well since stopped using BC powder Reports has stopped using Unisom nightly, now just as needed to help with sleep Continue discussion regarding sleep hygiene. Patient plans to work on reducing screentime (phone use) in hours prior to bedtime Remind patient to contact office to schedule follow up appointment with PCP   Diabetes: Goal on Track (progressing): YES. Uncontrolled based on latest A1C; current treatment: Glipizide 10 mg twice daily before breakfast and supper Metformin 850 mg twice daily Medications tried in the past: Rybelsus (cost); Ozempic (cost); [name] (cost) Current glucose readings: fasting glucose ranging 103-150; 2-hours after meal: 120-170 Denies hypoglycemic symptoms Current exercise: goes to gym ~80-90 mins walks, chest press, knee press x 3-4 days/week Reviewed long term cardiovascular and  renal outcomes of uncontrolled blood sugar Reviewed goal A1c, goal fasting, and goal 2 hour post prandial glucose Reviewed dietary modifications including: Encourage patient to have regular well-balance meals throughout the day (including breakfast), while controlling carbohydrate portion sizes Reports has improved snacking habits - reducing carbohydrate portion sizes Encourage patient to review nutrition labels for carbohydrate content of foods Recommend to check glucose, keep a log of the results and have this record to review during future appointments Have counseled  patient on s/s of low blood sugar and how to treat lows Patient interested in restarting Rybelsus, but medication unaffordable through his [facility] Medicare coverage. Based on reported income, patient meets criteria for Rybelsus patient assistance from [employer] with [facility] CPhT Susy Frizzle and PCP to assist patient with with patient assistance application process  Hypertension:  Goal on Track (progressing): YES. Uncontrolled based on latest in office reading; current treatment: amlodipine 10 mg daily metoprolol 25 mg twice daily Reports has a home wrist blood pressure monitor Reports recent home blood pressure readings: 12/14: 134/77 12/11: 123/79 12/5: 126/76 12/4: 145/78 Denies hypotensive symptoms such as dizziness or lightheadedness Current exercise: goes to gym ~80-90 mins walks, chest press, knee press x 3-4 days/week Encourage patient to obtain an upper arm home blood pressure monitor for greater accuracy of readings.  Counsel patient on using health plan over the counter benefit Reviewed long term cardiovascular and renal outcomes of uncontrolled blood pressure Reviewed appropriate blood pressure monitoring technique and reviewed goal blood pressure Encourage patient to limit salt/sodium intake Recommended to check home blood pressure and heart rate, keep a log of the results and have this record to review during future appointments   Patient Goals/Self-Care Activities patient will:  - take medications as prescribed as evidenced by patient report and record review - check glucose , document, and provide at future appointments - check blood pressure, document, and provide at future appointments - collaborate with provider on medication access solutions       Plan: Telephone follow up appointment with care management team member scheduled for:  02/19/2022 at 9:45 AM  [name], PharmD, [place] [phone]

## 2022-01-24 NOTE — Patient Instructions (Signed)
Visit Information  Thank you for taking time to visit with me today. Please don't hesitate to contact me if I can be of assistance to you before our next scheduled telephone appointment.  Following are the goals we discussed today:   Goals Addressed             This Visit's Progress    Pharmacy Goals       Our goal A1c is less than 7%. This corresponds with fasting sugars less than 130 and 2 hour after meal sugars less than 180. Please keep a log of your results when checking your blood sugar   Our goal bad cholesterol, or LDL, is less than 70 . This is why it is important to continue taking your rosuvastatin.  Check your blood pressure twice weekly, and any time you have concerning symptoms like headache, chest pain, dizziness, shortness of breath, or vision changes.   Our goal is less than 130/80.  To appropriately check your blood pressure, make sure you do the following:  1) Avoid caffeine, exercise, or tobacco products for 30 minutes before checking. Empty your bladder. 2) Sit with your back supported in a flat-backed chair. Rest your arm on something flat (arm of the chair, table, etc). 3) Sit still with your feet flat on the floor, resting, for at least 5 minutes.  4) Check your blood pressure. Take 1-2 readings.  5) Write down these readings and bring with you to any provider appointments.  Bring your home blood pressure machine with you to a provider's office for accuracy comparison at least once a year.   Make sure you take your blood pressure medications before you come to any office visit, even if you were asked to fast for labs.  Please watch the mail for an envelope from [employer] containing the patient assistance program application. Please complete this application and mail back to [place] along with a copy of your Medicare Part D prescription card and a copy of your proof of income document.    [name], PharmD,  [place] [phone]          Our next appointment is by telephone on 02/19/2022 at 9:45 AM  Please call the care guide team at [phone] if you need to cancel or reschedule your appointment.    Patient verbalizes understanding of instructions and care plan provided today and agrees to view in [facility]. Active MyChart status and patient understanding of how to access instructions and care plan via MyChart confirmed with patient.

## 2022-02-09 DIAGNOSIS — I10 Essential (primary) hypertension: Secondary | ICD-10-CM

## 2022-02-09 DIAGNOSIS — E119 Type 2 diabetes mellitus without complications: Secondary | ICD-10-CM

## 2022-02-14 ENCOUNTER — Other Ambulatory Visit: Payer: Self-pay | Admitting: Internal Medicine

## 2022-02-14 DIAGNOSIS — I700 Atherosclerosis of aorta: Secondary | ICD-10-CM

## 2022-02-14 DIAGNOSIS — I10 Essential (primary) hypertension: Secondary | ICD-10-CM

## 2022-02-14 DIAGNOSIS — E1169 Type 2 diabetes mellitus with other specified complication: Secondary | ICD-10-CM

## 2022-02-14 NOTE — Telephone Encounter (Signed)
Requested Prescriptions  Pending Prescriptions Disp Refills   TRESIBA FLEXTOUCH 100 UNIT/ML FlexTouch Pen [Pharmacy Med Name: TRESIBA FLEXTOUCH PEN ([id number]] 9 mL 0    Sig: ADMINISTER 10 UNITS UNDER THE SKIN AT BEDTIME     Endocrinology:  Diabetes - Insulins Passed - 02/14/2022 11:59 AM      Passed - HBA1C is between 0 and 7.9 and within 180 days    Hgb A1c MFr Bld  Date Value Ref Range Status  01/07/2022 7.7 (H) <5.7 % of total Hgb Final    Comment:    For someone without known diabetes, a hemoglobin A1c value of 6.5% or greater indicates that they may have  diabetes and this should be confirmed with a follow-up  test. . For someone with known diabetes, a value <7% indicates  that their diabetes is well controlled and a value  greater than or equal to 7% indicates suboptimal  control. A1c targets should be individualized based on  duration of diabetes, age, comorbid conditions, and  other considerations. . Currently, no consensus exists regarding use of hemoglobin A1c for diagnosis of diabetes for children. Renella Cunas - Valid encounter within last 6 months    Recent Outpatient Visits           1 month ago Encounter for general adult medical examination with abnormal findings   [facility] [place], Coralie Keens, NP   2 months ago Type 2 diabetes mellitus with other specified complication, without long-term current use of insulin [facility])   [facility], Coralie Keens, NP   4 months ago Essential hypertension   [place], NP   4 months ago Type 2 diabetes mellitus with other specified complication, without long-term current use of insulin [facility])   [facility], NP   5 months ago Right groin pain   [place], DO       Future Appointments             In 9 months Hollice Espy, MD [place]

## 2022-02-19 ENCOUNTER — Ambulatory Visit (INDEPENDENT_AMBULATORY_CARE_PROVIDER_SITE_OTHER): Payer: Medicare HMO | Admitting: Pharmacist

## 2022-02-19 DIAGNOSIS — I10 Essential (primary) hypertension: Secondary | ICD-10-CM

## 2022-02-19 DIAGNOSIS — E7849 Other hyperlipidemia: Secondary | ICD-10-CM

## 2022-02-19 DIAGNOSIS — E119 Type 2 diabetes mellitus without complications: Secondary | ICD-10-CM

## 2022-02-19 NOTE — Patient Instructions (Signed)
Visit Information  Thank you for taking time to visit with me today. Please don't hesitate to contact me if I can be of assistance to you before our next scheduled telephone appointment.  Following are the goals we discussed today:   Goals Addressed             This Visit's Progress    Pharmacy Goals       Our goal A1c is less than 7%. This corresponds with fasting sugars less than 130 and 2 hour after meal sugars less than 180. Please keep a log of your results when checking your blood sugar   Our goal bad cholesterol, or LDL, is less than 70 . This is why it is important to continue taking your rosuvastatin.  Check your blood pressure twice weekly, and any time you have concerning symptoms like headache, chest pain, dizziness, shortness of breath, or vision changes.   Our goal is less than 130/80.  To appropriately check your blood pressure, make sure you do the following:  1) Avoid caffeine, exercise, or tobacco products for 30 minutes before checking. Empty your bladder. 2) Sit with your back supported in a flat-backed chair. Rest your arm on something flat (arm of the chair, table, etc). 3) Sit still with your feet flat on the floor, resting, for at least 5 minutes.  4) Check your blood pressure. Take 1-2 readings.  5) Write down these readings and bring with you to any provider appointments.  Bring your home blood pressure machine with you to a provider's office for accuracy comparison at least once a year.   Make sure you take your blood pressure medications before you come to any office visit, even if you were asked to fast for labs.    [name], PharmD, [place] [phone]          Our next appointment is by telephone on 03/31/2022 at 9:15 AM   Please call the care guide team at [phone] if you need to cancel or reschedule your appointment.    Patient verbalizes understanding of  instructions and care plan provided today and agrees to view in [place]. Active MyChart status and patient understanding of how to access instructions and care plan via MyChart confirmed with patient.

## 2022-02-19 NOTE — Chronic Care Management (AMB) (Signed)
Chronic Care Management CCM Pharmacy Note  02/19/2022 Name:  Daniel Bradley. MRN:  [id number] DOB:  September 14, 1955   Subjective: Daniel Bradley. is an 67 y.o. year old male who is a primary patient of [name], NP.  The CCM team was consulted for assistance with disease management and care coordination needs.    Engaged with patient by telephone for follow up visit for pharmacy case management and/or care coordination services.   Objective:  Medications Reviewed Today     Reviewed by Rennis Petty, RPH-CPP (Pharmacist) on 01/24/22 at 0909  Med List Status: <None>   Medication Order Taking? Sig Documenting Provider Last Dose Status Informant  amLODipine (NORVASC) 10 MG tablet [id number] Yes Take 1 tablet (10 mg total) by mouth daily. [name], NP Taking Active   aspirin EC 81 MG tablet [id number]  81 mg daily. Swallow whole. [provider]  Active   Aspirin-Salicylamide-Caffeine (BC HEADACHE POWDER PO) [id number]  Take 1 tablet by mouth daily as needed. [provider]  Active   blood glucose meter kit and supplies [id number]  Dispense based on patient and insurance preference. Use to check blood sugar daily as directed. DX Ell.9 [name], NP  Active   Cholecalciferol 125 MCG (5000 UT) capsule [id number]  Take 5,000 Units by mouth daily. [provider]  Active   doxylamine, Sleep, (UNISOM) 25 MG tablet [id number]  Take 25 mg by mouth at bedtime as needed. [provider]  Active   glipiZIDE (GLUCOTROL) 10 MG tablet [id number] Yes TAKE 1 TABLET(10 MG) BY MOUTH TWICE DAILY BEFORE A MEAL 40, Coralie Keens, NP Taking Active   metFORMIN (GLUCOPHAGE) 850 MG tablet [id number] Yes TAKE 1 TABLET(850 MG) BY MOUTH TWICE DAILY WITH A MEAL Baity, Coralie Keens, NP Taking Active   metoprolol tartrate (LOPRESSOR) 25 MG tablet [id number] Yes Take 1 tablet (25 mg total) by mouth 2 (two) times daily. [name], NP Taking Active   rosuvastatin  (CRESTOR) 20 MG tablet [id number]  TAKE 1 TABLET(20 MG) BY MOUTH DAILY Baity, Coralie Keens, NP  Active   senna (SENOKOT) 8.6 MG tablet [id number]  Take 2 tablets by mouth daily.  Patient not taking: Reported on 01/10/2022   [provider]  Active             Pertinent Labs:  Lab Results  Component Value Date   HGBA1C 7.7 (H) 01/07/2022   Lab Results  Component Value Date   CHOL 150 01/07/2022   HDL 49 01/07/2022   LDLCALC 83 01/07/2022   TRIG 86 01/07/2022   CHOLHDL 3.1 01/07/2022   Lab Results  Component Value Date   CREATININE 1.00 01/07/2022   BUN 19 01/07/2022   NA 141 01/07/2022   K 4.5 01/07/2022   CL 104 01/07/2022   CO2 27 01/07/2022   BP Readings from Last 3 Encounters:  01/07/22 (!) 146/80  12/09/21 134/76  11/19/21 127/83   Pulse Readings from Last 3 Encounters:  01/07/22 89  12/09/21 91  11/19/21 99     SDOH:  (Social Determinants of Health) assessments and interventions performed:  SDOH Interventions    Flowsheet Row Office Visit from 01/07/2022 in [facility] from 01/01/2022 in [facility] Office Visit from 12/09/2021 in [place] from 08/29/2021 in [facility] Office Visit from 03/19/2021 in [facility] Nutrition from 05/12/2016 in [place]  SDOH Interventions        Food Insecurity Interventions -- Intervention Not Indicated -- Intervention Not Indicated -- --  Housing Interventions -- Intervention Not Indicated -- Intervention Not Indicated -- --  Transportation Interventions -- Intervention Not Indicated -- Intervention Not Indicated -- --  Utilities Interventions -- Intervention Not Indicated -- -- -- --  Alcohol Usage Interventions -- Intervention Not Indicated (Score <7) -- -- -- --  Depression Interventions/Treatment  Patient refuses Treatment -- Patient refuses Treatment PHQ2-9 Score <4 Follow-up  Not Indicated Medication Currently on Treatment  [was in counseling and has healthy coping strategies in place]  Financial Strain Interventions -- Other (Comment)  [needs assistance with paying for Tresiba, pharm D consult pending] -- Intervention Not Indicated -- --  Physical Activity Interventions -- Intervention Not Indicated, Other (Comments)  [goes to the gym, 3 or 4 days a week, does walking, bench presses, weights, and knee presses] -- Intervention Not Indicated -- --  Stress Interventions -- Intervention Not Indicated -- Intervention Not Indicated -- --  Social Connections Interventions -- Intervention Not Indicated, Other (Comment)  [lives with his girlfriend April, the patient has been divorced 3 times, good support system, has 2 sons] -- Intervention Not Indicated -- --       CCM Care Plan  Review of patient past medical history, allergies, medications, health status, including review of consultants reports, laboratory and other test data, was performed as part of comprehensive evaluation and provision of chronic care management services.   Care Plan : General Pharmacy (Adult)  Updates made by Rennis Petty, RPH-CPP since 02/19/2022 12:00 AM     Problem: Disease Progression      Long-Range Goal: Disease Progression Prevented or Minimized   Start Date: 01/10/2022  Expected End Date: 04/10/2022  Recent Progress: On track  Priority: High  Note:   Current Barriers:  Unable to independently afford treatment regimen Unable to achieve control of A1C Unable to achieve control of blood pressure   Pharmacist Clinical Goal(s):  patient will verbalize ability to afford treatment regimen achieve control of T2DM as evidenced by A1C  through collaboration with PharmD and provider achieve control of HTN as evidenced by blood pressure through collaboration with PharmD and provider.    Interventions: 1:1 collaboration with [name], NP regarding development and update of  comprehensive plan of care as evidenced by provider attestation and co-signature Inter-disciplinary care team collaboration (see longitudinal plan of care) Reports takes his medications directly from pill bottles and has a routine to aid with adherence; denies missed doses Denies need to use weekly pillbox at this time Patient has stopped using Unisom nightly, now just as needed to help with sleep Continue discussion regarding sleep hygiene. Patient working on reducing screentime (phone use) in hours prior to bedtime Again remind patient to contact office to schedule follow up appointment with PCP   Diabetes: Goal on Track (progressing): YES. Uncontrolled based on latest A1C; current treatment: Glipizide 10 mg twice daily before breakfast and supper (30 minutes prior to meal) Metformin 850 mg twice daily Medications tried in the past: Rybelsus (cost); Ozempic (cost); [name] (cost) Current glucose readings:  Morning fasting blood sugar  4 - January 169  5 - January 125  6 - January 125  7 - January 178  8 - January 163  9 - January 159  10 - January -  Attributes elevated readings to days when he had a carbohydrate snack/dessert the night before Denies hypoglycemic symptoms Current  dietary patterns: breakfast: skipping recently; lunch: cereal with banana or sausage biscuit with watermelon or cantaloupe; supper: beans, fried pork chop, mixed vegetables; dessert: sweet potato pie or oatmeal cookies, cheesecake; drinks: water and less sugar-free [facility] Current exercise: goes to gym ~80-90 mins walks, chest press, knee press x 3-4 days/week Statin: rosuvastatin 20 mg daily Reviewed long term cardiovascular and renal outcomes of uncontrolled blood sugar Reviewed goal A1c, goal fasting, and goal 2 hour post prandial glucose Reviewed dietary modifications including: Encourage patient to have regular well-balance meals throughout the day (including breakfast), while controlling  carbohydrate portion sizes Discuss strategies for reducing carbohydrate portion sizes of snacks Encourage patient to review nutrition labels for carbohydrate content of foods Recommend to check glucose, keep a log of the results and have this record to review during future appointments Have counseled patient on s/s of low blood sugar and how to treat lows Patient interested in restarting Rybelsus, but medication unaffordable through his [facility] Medicare coverage. Based on reported income, patient meets criteria for Rybelsus patient assistance from [employer] with [facility] CPh[facility] Susy Frizzle and PCP to assist patient with with patient assistance application process Today patient confirms received application from [place]. Patient working on obtaining final document for proof of income and then plans to return application and documents to [facility] CPhT  Hypertension:  Goal on Track (progressing): YES. Uncontrolled based on latest in office reading; current treatment: amlodipine 10 mg daily metoprolol 25 mg twice daily Reports using home wrist blood pressure monitor Reports obtained upper arm BP monitor, but has not yet started using Reports recent home blood pressure readings: 1/1: 125/70; 126/79, HR 92 1/8: 123/77, HR 88 1/9: 137/80, HR 83 Denies hypotensive symptoms such as dizziness or lightheadedness Current exercise: goes to gym ~80-90 mins walks, chest press, knee press x 3-4 days/week Encourage patient to setup and start using upper arm home blood pressure monitor for greater accuracy of readings.  Reviewed long term cardiovascular and renal outcomes of uncontrolled blood pressure Reviewed appropriate blood pressure monitoring technique and reviewed goal blood pressure Encourage patient to limit salt/sodium intake Recommended to check home blood pressure and heart rate, keep a log of the results and have this record to review during future appointments   Patient Goals/Self-Care  Activities patient will:  - take medications as prescribed as evidenced by patient report and record review - check glucose , document, and provide at future appointments - check blood pressure, document, and provide at future appointments - collaborate with provider on medication access solutions       Plan: Telephone follow up appointment with care management team member scheduled for:  03/31/2022 at [place], PharmD, [place] [phone]

## 2022-02-21 ENCOUNTER — Ambulatory Visit: Payer: Self-pay

## 2022-02-21 ENCOUNTER — Telehealth: Payer: Medicare HMO

## 2022-02-21 DIAGNOSIS — E7849 Other hyperlipidemia: Secondary | ICD-10-CM

## 2022-02-21 DIAGNOSIS — E119 Type 2 diabetes mellitus without complications: Secondary | ICD-10-CM

## 2022-02-21 NOTE — Patient Instructions (Signed)
Please call the care guide team at [phone] if you need to cancel or reschedule your appointment.   If you are experiencing a Mental Health or [place] or need someone to talk to, please call the Suicide and Crisis Lifeline: 988 call the [place] National Suicide Prevention Lifeline: 603-443-5328 or TTY: (941)881-5890 TTY 267-112-4158) to talk to a trained counselor call 1-800-273-TALK (toll free, 24 hour hotline)   Following is a copy of the CCM Program Consent:  CCM service includes personalized support from designated clinical staff supervised by the physician, including individualized plan of care and coordination with other care providers 24/7 contact phone numbers for assistance for urgent and routine care needs. Service will only be billed when office clinical staff spend 20 minutes or more in a month to coordinate care. Only one practitioner may furnish and bill the service in a calendar month. The patient may stop CCM services at amy time (effective at the end of the month) by phone call to the office staff. The patient will be responsible for cost sharing (co-pay) or up to 20% of the service fee (after annual deductible is met)  Following is a copy of your full provider care plan:   Goals Addressed             This Visit's Progress    CCM Expected Outcome:  Monitor, Self-Manage and Reduce Symptoms of Diabetes       Current Barriers:  Knowledge Deficits related to the significance of controlling blood sugars and keeping A1C levels down Care Coordination needs related to medication cost constraints  in a patient with DM Chronic Disease Management support and education needs related to effective management of DM Financial Constraints.  Lab Results  Component Value Date   HGBA1C 7.7 (H) 01/07/2022     Planned Interventions: Provided education to patient about basic DM disease process. Review and education provided. ; Reviewed medications with patient and  discussed importance of medication adherence. The patient is compliant with medications. The patient states that he is waiting on one more document from his pension to turn in so he can get the paperwork back for help with Rybelsus. He states as soon as he gets the paperwork he will send in the documents in the provided envelope. The patient is hopeful he can get assistance with this since his out of pocket cost is $700.00 ;        Reviewed prescribed diet with patient heart healthy/ADA diet. The patient states that he is happy the holidays are over and he can focus on eating better and not have food around that temps him. He admits sweets are his weak points. Education provided on eating balanced meals and monitoring for abnormal readings  Counseled on importance of regular laboratory monitoring as prescribed. Has regular lab work done;        Discussed plans with patient for ongoing care management follow up and provided patient with direct contact information for care management team;      Provided patient with written educational materials related to hypo and hyperglycemia and importance of correct treatment. The patient states the highest his blood sugars has been is 178 and the lowest it has been is 125. The patient denies any sx and sx of hypo or hyperglycemia. Review and education provided. States he does notice his blood sugar level dropping after he has exercised. Education provided.        Reviewed scheduled/upcoming provider appointments including: saw pcp on 01-07-2022. Reminder given  to schedule regular follow ups with the pcp.  Advised patient, providing education and rationale, to check cbg twice daily and when you have symptoms of low or high blood sugar and record. The patient is taking his blood sugars. Gave several readings to the pharm D on 02-19-2022.  Review of the goal of fasting is <130 and post prandial of <180.      call provider for findings outside established parameters;        Referral made to pharmacy team for assistance with assistance with cost constraints related to Rybelsus and inability to pay for refills. The patient is working with the pharm D for medication cost constraints  Review of patient status, including review of consultants reports, relevant laboratory and other test results, and medications completed;       Advised patient to discuss changes in DM health and well being with provider;      Screening for signs and symptoms of depression related to chronic disease state;        Assessed social determinant of health barriers;         Symptom Management: Take medications as prescribed   Attend all scheduled provider appointments Call provider office for new concerns or questions  call the Suicide and Crisis Lifeline: 988 call the [place] National Suicide Prevention Lifeline: 719-886-3467 or TTY: 305-413-7887 TTY 407-669-0005) to talk to a trained counselor call 1-800-273-TALK (toll free, 24 hour hotline) if experiencing a Mental Health or [place]  check feet daily for cuts, sores or redness trim toenails straight across manage portion size wash and dry feet carefully every day wear comfortable, cotton socks wear comfortable, well-fitting shoes  Follow Up Plan: Telephone follow up appointment with care management team member scheduled for: 04-11-2022 at 145 pm       CCM Expected Outcome:  Monitor, Self-Manage and Reduce Symptoms of: HLD       Current Barriers:  Chronic Disease Management support and education needs related to effective management of HLD BP Readings from Last 3 Encounters:  01/07/22 (!) 146/80  12/09/21 134/76  11/19/21 127/83    Lab Results  Component Value Date   CHOL 150 01/07/2022   CHOL 229 (H) 10/15/2021   CHOL 198 06/20/2021   Lab Results  Component Value Date   HDL 49 01/07/2022   HDL 49 10/15/2021   HDL 51 06/20/2021   Lab Results  Component Value Date   LDLCALC 83 01/07/2022   LDLCALC 152  (H) 10/15/2021   LDLCALC 118 (H) 06/20/2021   Lab Results  Component Value Date   TRIG 86 01/07/2022   TRIG 153 (H) 10/15/2021   TRIG 177 (H) 06/20/2021   Lab Results  Component Value Date   CHOLHDL 3.1 01/07/2022   CHOLHDL 4.7 10/15/2021   CHOLHDL 3.9 06/20/2021    Planned Interventions: Provider established cholesterol goals reviewed.Review of goals ; Counseled on importance of regular laboratory monitoring as prescribed. Has lab work done on a regular basis; Provided HLD Scientist, clinical (histocompatibility and immunogenetics). Education and support given; Reviewed role and benefits of statin for ASCVD risk reduction; Discussed strategies to manage statin-induced myalgias; Reviewed importance of limiting foods high in cholesterol. Review of heart healthy/ADA diet- the patient states he is glad the holidays are over so he can not have tempting foods around. He is trying to be more mindful of foods that are not good for him. Sweets is his weakness; Reviewed exercise goals and target of 150 minutes per week. Patient goes to the  gym and works out on a regular basis. Education and support given; Screening for signs and symptoms of depression related to chronic disease state;  Assessed social determinant of health barriers;   Symptom Management: Take medications as prescribed   Attend all scheduled provider appointments Call provider office for new concerns or questions  call the Suicide and Crisis Lifeline: 988 call the [place] National Suicide Prevention Lifeline: (618)771-0717 or TTY: 249-776-0710 TTY 8072480324) to talk to a trained counselor call 1-800-273-TALK (toll free, 24 hour hotline) if experiencing a Mental Health or [place]  - call for medicine refill 2 or 3 days before it runs out - take all medications exactly as prescribed - call doctor with any symptoms you believe are related to your medicine - call doctor when you experience any new symptoms - go to all doctor appointments as  scheduled - adhere to prescribed diet: heart healthy/ADA diet   Follow Up Plan: Telephone follow up appointment with care management team member scheduled for: 04-11-2022 at 145 pm          Patient verbalizes understanding of instructions and care plan provided today and agrees to view in [place]. Active MyChart status and patient understanding of how to access instructions and care plan via MyChart confirmed with patient.     Telephone follow up appointment with care management team member scheduled for: 04-11-2022 at 145 pm

## 2022-02-21 NOTE — Chronic Care Management (AMB) (Signed)
Chronic Care Management   CCM RN Visit Note  02/21/2022 Name: Daniel Bradley. MRN: [id number] DOB: 1955-10-15  Subjective: Daniel Bradley. is a 67 y.o. year old male who is a primary care patient of [name], NP. The patient was referred to the Chronic Care Management team for assistance with care management needs subsequent to provider initiation of CCM services and plan of care.    Today's Visit:  Engaged with patient by telephone for follow up visit.        Goals Addressed             This Visit's Progress    CCM Expected Outcome:  Monitor, Self-Manage and Reduce Symptoms of Diabetes       Current Barriers:  Knowledge Deficits related to the significance of controlling blood sugars and keeping A1C levels down Care Coordination needs related to medication cost constraints  in a patient with DM Chronic Disease Management support and education needs related to effective management of DM Financial Constraints.  Lab Results  Component Value Date   HGBA1C 7.7 (H) 01/07/2022     Planned Interventions: Provided education to patient about basic DM disease process. Review and education provided. ; Reviewed medications with patient and discussed importance of medication adherence. The patient is compliant with medications. The patient states that he is waiting on one more document from his pension to turn in so he can get the paperwork back for help with Rybelsus. He states as soon as he gets the paperwork he will send in the documents in the provided envelope. The patient is hopeful he can get assistance with this since his out of pocket cost is $700.00 ;        Reviewed prescribed diet with patient heart healthy/ADA diet. The patient states that he is happy the holidays are over and he can focus on eating better and not have food around that temps him. He admits sweets are his weak points. Education provided on eating balanced meals and monitoring for abnormal readings   Counseled on importance of regular laboratory monitoring as prescribed. Has regular lab work done;        Discussed plans with patient for ongoing care management follow up and provided patient with direct contact information for care management team;      Provided patient with written educational materials related to hypo and hyperglycemia and importance of correct treatment. The patient states the highest his blood sugars has been is 178 and the lowest it has been is 125. The patient denies any sx and sx of hypo or hyperglycemia. Review and education provided. States he does notice his blood sugar level dropping after he has exercised. Education provided.        Reviewed scheduled/upcoming provider appointments including: saw pcp on 01-07-2022. Reminder given to schedule regular follow ups with the pcp.  Advised patient, providing education and rationale, to check cbg twice daily and when you have symptoms of low or high blood sugar and record. The patient is taking his blood sugars. Gave several readings to the pharm D on 02-19-2022.  Review of the goal of fasting is <130 and post prandial of <180.      call provider for findings outside established parameters;       Referral made to pharmacy team for assistance with assistance with cost constraints related to Rybelsus and inability to pay for refills. The patient is working with the pharm D for medication cost constraints  Review of patient  status, including review of consultants reports, relevant laboratory and other test results, and medications completed;       Advised patient to discuss changes in DM health and well being with provider;      Screening for signs and symptoms of depression related to chronic disease state;        Assessed social determinant of health barriers;         Symptom Management: Take medications as prescribed   Attend all scheduled provider appointments Call provider office for new concerns or questions  call the  Suicide and Crisis Lifeline: 988 call the [place] National Suicide Prevention Lifeline: 318-160-2805 or TTY: (220)011-1186 TTY (641)837-6465) to talk to a trained counselor call 1-800-273-TALK (toll free, 24 hour hotline) if experiencing a Mental Health or [place]  check feet daily for cuts, sores or redness trim toenails straight across manage portion size wash and dry feet carefully every day wear comfortable, cotton socks wear comfortable, well-fitting shoes  Follow Up Plan: Telephone follow up appointment with care management team member scheduled for: 04-11-2022 at 145 pm       CCM Expected Outcome:  Monitor, Self-Manage and Reduce Symptoms of: HLD       Current Barriers:  Chronic Disease Management support and education needs related to effective management of HLD BP Readings from Last 3 Encounters:  01/07/22 (!) 146/80  12/09/21 134/76  11/19/21 127/83    Lab Results  Component Value Date   CHOL 150 01/07/2022   CHOL 229 (H) 10/15/2021   CHOL 198 06/20/2021   Lab Results  Component Value Date   HDL 49 01/07/2022   HDL 49 10/15/2021   HDL 51 06/20/2021   Lab Results  Component Value Date   LDLCALC 83 01/07/2022   LDLCALC 152 (H) 10/15/2021   LDLCALC 118 (H) 06/20/2021   Lab Results  Component Value Date   TRIG 86 01/07/2022   TRIG 153 (H) 10/15/2021   TRIG 177 (H) 06/20/2021   Lab Results  Component Value Date   CHOLHDL 3.1 01/07/2022   CHOLHDL 4.7 10/15/2021   CHOLHDL 3.9 06/20/2021    Planned Interventions: Provider established cholesterol goals reviewed.Review of goals ; Counseled on importance of regular laboratory monitoring as prescribed. Has lab work done on a regular basis; Provided HLD Scientist, clinical (histocompatibility and immunogenetics). Education and support given; Reviewed role and benefits of statin for ASCVD risk reduction; Discussed strategies to manage statin-induced myalgias; Reviewed importance of limiting foods high in cholesterol. Review of heart  healthy/ADA diet- the patient states he is glad the holidays are over so he can not have tempting foods around. He is trying to be more mindful of foods that are not good for him. Sweets is his weakness; Reviewed exercise goals and target of 150 minutes per week. Patient goes to the gym and works out on a regular basis. Education and support given; Screening for signs and symptoms of depression related to chronic disease state;  Assessed social determinant of health barriers;   Symptom Management: Take medications as prescribed   Attend all scheduled provider appointments Call provider office for new concerns or questions  call the Suicide and Crisis Lifeline: 988 call the [place] National Suicide Prevention Lifeline: 343-811-3728 or TTY: (913) 078-0797 TTY 785-354-4479) to talk to a trained counselor call 1-800-273-TALK (toll free, 24 hour hotline) if experiencing a Mental Health or [place]  - call for medicine refill 2 or 3 days before it runs out - take all medications exactly as prescribed -  call doctor with any symptoms you believe are related to your medicine - call doctor when you experience any new symptoms - go to all doctor appointments as scheduled - adhere to prescribed diet: heart healthy/ADA diet   Follow Up Plan: Telephone follow up appointment with care management team member scheduled for: 04-11-2022 at 145 pm          Plan:Telephone follow up appointment with care management team member scheduled for:  04-11-2022 at 145 pm  [place], MSN, CCM RN Care Manager  Chronic Care Management Direct Number: [phone]

## 2022-03-06 ENCOUNTER — Ambulatory Visit: Payer: Medicare HMO | Admitting: Internal Medicine

## 2022-03-12 DIAGNOSIS — E7849 Other hyperlipidemia: Secondary | ICD-10-CM

## 2022-03-12 DIAGNOSIS — I10 Essential (primary) hypertension: Secondary | ICD-10-CM

## 2022-03-12 DIAGNOSIS — E119 Type 2 diabetes mellitus without complications: Secondary | ICD-10-CM

## 2022-03-16 ENCOUNTER — Other Ambulatory Visit: Payer: Self-pay | Admitting: Internal Medicine

## 2022-03-17 NOTE — Telephone Encounter (Signed)
Requested medications are due for refill today.  Unsure  Requested medications are on the active medications list.  no  Last refill. none  Future visit scheduled.   yes  Notes to clinic.  This is a request for supplies. No current order for these.    Requested Prescriptions  Pending Prescriptions Disp Refills   ACCU-CHEK GUIDE test strip [Pharmacy Med Name: ACCU-CHEK GUIDE TEST STRIPS 100S] 100 strip     Sig: USE AS DIRECTED ONCE DAILY     Endocrinology: Diabetes - Testing Supplies Passed - 03/16/2022  1:23 PM      Passed - Valid encounter within last 12 months    Recent Outpatient Visits           2 months ago Encounter for general adult medical examination with abnormal findings   [facility] [place], [place] W, NP   3 months ago Type 2 diabetes mellitus with other specified complication, without long-term current use of insulin [facility])   [facility] [place], Coralie Keens, NP   5 months ago Essential hypertension   [facility] [place], [place] W, NP   5 months ago Type 2 diabetes mellitus with other specified complication, without long-term current use of insulin [facility])   [facility] [place], Coralie Keens, NP   6 months ago Right groin pain   [place], Devonne Doughty, DO       Future Appointments             In 1 week Garnette Gunner, Coralie Keens, NP [facility], [place]   In 8 months Hollice Espy, MD [facility]              Accu-Chek Softclix Lancets lancets [place] Med Name: [place] 100 each     Sig: USE AS DIRECTED ONCE DAILY     Endocrinology: Diabetes - Testing Supplies Passed - 03/16/2022  1:23 PM      Passed - Valid encounter within last 12 months    Recent Outpatient Visits           2 months ago Encounter for general adult medical examination with abnormal findings   [facility] [place], [place] W, NP   3 months ago Type 2 diabetes mellitus with other specified complication, without long-term current use of insulin [facility])   [facility] [place], Coralie Keens, NP   5 months ago Essential hypertension   [facility] [place], [place] W, NP   5 months ago Type 2 diabetes mellitus with other specified complication, without long-term current use of insulin [facility])   [facility] [place], Coralie Keens, NP   6 months ago Right groin pain   [place], Alexander J, DO       Future Appointments             In 1 week Baity, Coralie Keens, NP [facility], [place]   In 8 months Hollice Espy, [place]

## 2022-03-21 ENCOUNTER — Other Ambulatory Visit: Payer: Self-pay | Admitting: Internal Medicine

## 2022-03-21 DIAGNOSIS — E119 Type 2 diabetes mellitus without complications: Secondary | ICD-10-CM

## 2022-03-21 NOTE — Telephone Encounter (Signed)
Requested Prescriptions  Refused Prescriptions Disp Refills   ACCU-CHEK GUIDE test strip [Pharmacy Med Name: ACCU-CHEK GUIDE TEST STRIPS 100S] 100 strip 0    Sig: USE AS DIRECTED ONCE DAILY.     Endocrinology: Diabetes - Testing Supplies Passed - 03/21/2022  8:09 AM      Passed - Valid encounter within last 12 months    Recent Outpatient Visits           2 months ago Encounter for general adult medical examination with abnormal findings   [facility] [place], [place] W, NP   3 months ago Type 2 diabetes mellitus with other specified complication, without long-term current use of insulin [facility])   [facility] [place], Coralie Keens, NP   5 months ago Essential hypertension   [facility] [place], [place] W, NP   5 months ago Type 2 diabetes mellitus with other specified complication, without long-term current use of insulin [facility])   [facility] [place], Coralie Keens, NP   7 months ago Right groin pain   [place], Alexander J, DO       Future Appointments             In 6 days Baity, Coralie Keens, NP [facility], [place]   In 8 months Hollice Espy, MD [facility] [place]             Accu-Chek Softclix Lancets lancets [place] Med Name: [place] 100 each 0    Sig: USE AS DIRECTED DAILY     Endocrinology: Diabetes - Testing Supplies Passed - 03/21/2022  8:09 AM      Passed - Valid encounter within last 12 months    Recent Outpatient Visits           2 months ago Encounter for general adult medical examination with abnormal findings   [facility] [place], [place] W, NP   3 months ago Type 2 diabetes mellitus with other specified complication, without long-term current use of insulin [facility])   [facility] [place], Coralie Keens, NP   5 months ago Essential  hypertension   [facility] [place], [place] W, NP   5 months ago Type 2 diabetes mellitus with other specified complication, without long-term current use of insulin [facility])   [facility] [place], Coralie Keens, NP   7 months ago Right groin pain   [place], Alexander J, DO       Future Appointments             In 6 days Baity, Coralie Keens, NP [facility], [place]   In 8 months Hollice Espy, [place]

## 2022-03-21 NOTE — Telephone Encounter (Signed)
Requested Prescriptions  Pending Prescriptions Disp Refills   metFORMIN (GLUCOPHAGE) 850 MG tablet [Pharmacy Med Name: METFORMIN 850MG TABLETS] 180 tablet 1    Sig: TAKE 1 TABLET(850 MG) BY MOUTH TWICE DAILY WITH A MEAL     Endocrinology:  Diabetes - Biguanides Failed - 03/21/2022 12:29 PM      Failed - B12 Level in normal range and within 720 days    No results found for: "VITAMINB12"       Failed - CBC within normal limits and completed in the last 12 months    WBC  Date Value Ref Range Status  01/07/2022 6.4 3.8 - 10.8 Thousand/uL Final   RBC  Date Value Ref Range Status  01/07/2022 5.55 4.20 - 5.80 Million/uL Final   Hemoglobin  Date Value Ref Range Status  01/07/2022 13.9 13.2 - 17.1 g/dL Final  03/30/2020 14.0 13.0 - 17.7 g/dL Final   HCT  Date Value Ref Range Status  01/07/2022 44.2 38.5 - 50.0 % Final   Hematocrit  Date Value Ref Range Status  03/30/2020 42.5 37.5 - 51.0 % Final   MCHC  Date Value Ref Range Status  01/07/2022 31.4 (L) 32.0 - 36.0 g/dL Final   [facility]  Date Value Ref Range Status  01/07/2022 25.0 (L) 27.0 - 33.0 pg Final   MCV  Date Value Ref Range Status  01/07/2022 79.6 (L) 80.0 - 100.0 fL Final  03/30/2020 79 79 - 97 fL Final  04/21/2012 80 80 - 100 fL Final   No results found for: "PLTCOUNTKUC", "LABPLAT", "POCPLA" RDW  Date Value Ref Range Status  01/07/2022 14.5 11.0 - 15.0 % Final  03/30/2020 14.6 11.6 - 15.4 % Final  04/21/2012 14.9 (H) 11.5 - 14.5 % Final         Passed - Cr in normal range and within 360 days    Creat  Date Value Ref Range Status  01/07/2022 1.00 0.70 - 1.35 mg/dL Final   Creatinine, Urine  Date Value Ref Range Status  01/07/2022 120 20 - 320 mg/dL Final         Passed - HBA1C is between 0 and 7.9 and within 180 days    Hgb A1c MFr Bld  Date Value Ref Range Status  01/07/2022 7.7 (H) <5.7 % of total Hgb Final    Comment:    For someone without known diabetes, a hemoglobin A1c value of 6.5% or greater  indicates that they may have  diabetes and this should be confirmed with a follow-up  test. . For someone with known diabetes, a value <7% indicates  that their diabetes is well controlled and a value  greater than or equal to 7% indicates suboptimal  control. A1c targets should be individualized based on  duration of diabetes, age, comorbid conditions, and  other considerations. . Currently, no consensus exists regarding use of hemoglobin A1c for diagnosis of diabetes for children. .          Passed - eGFR in normal range and within 360 days    EGFR (African American)  Date Value Ref Range Status  04/21/2012 59 (L)  Final   GFR calc Af Amer  Date Value Ref Range Status  03/30/2020 100 >59 mL/min/1.73 Final    Comment:    **In accordance with recommendations from the NKF-ASN Task force,**   [facility] is in the process of updating its eGFR calculation to the   2021 CKD-EPI creatinine equation that estimates kidney function   without a  race variable.    EGFR (Non-African Amer.)  Date Value Ref Range Status  04/21/2012 51 (L)  Final    Comment:    eGFR values <49m/min/1.73 m2 may be an indication of chronic kidney disease (CKD). Calculated eGFR is useful in patients with stable renal function. The eGFR calculation will not be reliable in acutely ill patients when serum creatinine is changing rapidly. It is not useful in  patients on dialysis. The eGFR calculation may not be applicable to patients at the low and high extremes of body sizes, pregnant women, and vegetarians.    GFR, Estimated  Date Value Ref Range Status  06/23/2021 >60 >60 mL/min Final    Comment:    (NOTE) Calculated using the CKD-EPI Creatinine Equation (2021)    eGFR  Date Value Ref Range Status  01/07/2022 83 > OR = 60 mL/min/1.778mFinal         Passed - Valid encounter within last 6 months    Recent Outpatient Visits           2 months ago Encounter for general adult medical examination  with abnormal findings   [facility] [place]   3 months ago Type 2 diabetes mellitus with other specified complication, without long-term current use of insulin ([facility]  [facility]   5 months ago Essential hypertension   [facility]   5 months ago Type 2 diabetes mellitus with other specified complication, without long-term current use of insulin ([facility]  [facility]   7 months ago Right groin pain   [place], [place]       Future Appointments             In 6 days Baity, ReCoralie KeensNP [facility] In 8 months BrHollice EspyMDHus[place]

## 2022-03-26 ENCOUNTER — Telehealth: Payer: Self-pay | Admitting: Pharmacy Technician

## 2022-03-26 DIAGNOSIS — Z596 Low income: Secondary | ICD-10-CM

## 2022-03-26 NOTE — Progress Notes (Signed)
[facility])                                            [place] Team    03/26/2022  Daniel Bradley. Jul 07, 1955 OR:[id number]  Received both patient and provider portion(s) of patient assistance application(s) for Rybelsus. Faxed completed application and required documents into [employer].    [name] P. Candice Lunney, [place]  [phone]

## 2022-03-27 ENCOUNTER — Ambulatory Visit (INDEPENDENT_AMBULATORY_CARE_PROVIDER_SITE_OTHER): Payer: Medicare HMO | Admitting: Internal Medicine

## 2022-03-27 ENCOUNTER — Encounter: Payer: Self-pay | Admitting: Internal Medicine

## 2022-03-27 VITALS — BP 118/70 | HR 77 | Temp 96.4°F | Wt 285.0 lb

## 2022-03-27 DIAGNOSIS — E7849 Other hyperlipidemia: Secondary | ICD-10-CM | POA: Diagnosis not present

## 2022-03-27 DIAGNOSIS — I700 Atherosclerosis of aorta: Secondary | ICD-10-CM | POA: Diagnosis not present

## 2022-03-27 DIAGNOSIS — I10 Essential (primary) hypertension: Secondary | ICD-10-CM

## 2022-03-27 DIAGNOSIS — I25810 Atherosclerosis of coronary artery bypass graft(s) without angina pectoris: Secondary | ICD-10-CM | POA: Diagnosis not present

## 2022-03-27 DIAGNOSIS — Z6837 Body mass index (BMI) 37.0-37.9, adult: Secondary | ICD-10-CM

## 2022-03-27 DIAGNOSIS — E119 Type 2 diabetes mellitus without complications: Secondary | ICD-10-CM

## 2022-03-27 DIAGNOSIS — G4733 Obstructive sleep apnea (adult) (pediatric): Secondary | ICD-10-CM | POA: Diagnosis not present

## 2022-03-27 DIAGNOSIS — I214 Non-ST elevation (NSTEMI) myocardial infarction: Secondary | ICD-10-CM | POA: Diagnosis not present

## 2022-03-27 LAB — POCT GLYCOSYLATED HEMOGLOBIN (HGB A1C): Hemoglobin A1C: 8.7 % — AB (ref 4.0–5.6)

## 2022-03-27 NOTE — Assessment & Plan Note (Signed)
Noncompliant with CPAP Encourage weight loss as this can produce sleep apnea symptoms

## 2022-03-27 NOTE — Assessment & Plan Note (Signed)
Controlled on amlodipine and metoprolol Reinforced DASH diet and exercise for weight loss

## 2022-03-27 NOTE — Patient Instructions (Signed)

## 2022-03-27 NOTE — Assessment & Plan Note (Signed)
Encouraged him to consume a low-fat diet Continue rosuvastatin

## 2022-03-27 NOTE — Assessment & Plan Note (Signed)
Encourage diet and exercise for weight loss 

## 2022-03-27 NOTE — Assessment & Plan Note (Signed)
Encouraged him to consume a low-fat diet Continue rosuvastatin, metoprolol and aspirin

## 2022-03-27 NOTE — Progress Notes (Signed)
Subjective:    Patient ID: Daniel Blase., male    DOB: 07-12-1955, 67 y.o.   MRN: [id number]  HPI  Patient presents to clinic today for follow-up of chronic conditions.  HTN: His BP today is 118/70.  He is taking Amlodipine and Metoprolol as prescribed.  ECG from 03/2020 reviewed.  HLD with CAD, Aortic Atherosclerosis status post MI: His last LDL was 83, triglycerides 86, 12/2021.  He denies myalgias on Rosuvastatin.  He is taking Metoprolol and Aspirin as well.  He tries to consume low-fat diet.  He follows with cardiology.  DM2: His last A1c was 7.7%, 12/2021.  He is taking Metformin and Glipizide as prescribed.  His sugars range 122-166. He checks his feet routinely.  His last eye exam was 10/2021.  Flu 10/2021.  Pneumovax never.  Prevnar 20 12/2020.  [place] x 2.  OSA: He averages 7 hours of sleep per night without the use of his CPAP.  He takes Unisom as needed to help him sleep.  Sleep study from 09/2017 reviewed.  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   Elevated prostate specific antigen (PSA) 02/21/2013   Hemorrhoid    History of kidney stones    Hyperlipidemia    Hypertension    Incomplete bladder emptying 01/10/2013   Malignant neoplasm of prostate ([place]) 04/28/2013   prostate removed   Myocardial infarction ([place]) 2019   2 stents placed   Obstructive sleep apnea on CPAP    does not use cpap, does not snore much since loosing 80 lbs   Prostate cancer ([facility])     Current Outpatient Medications  Medication Sig Dispense Refill   Accu-Chek Softclix Lancets lancets USE AS DIRECTED ONCE DAILY 100 each 0   amLODipine (NORVASC) 10 MG tablet Take 1 tablet (10 mg total) by mouth daily. 90 tablet 0   aspirin EC 81 MG tablet 81 mg  daily. Swallow whole.     blood glucose meter kit and supplies Dispense based on patient and insurance preference. Use to check blood sugar daily as directed. DX Ell.9 1 each 0   Cholecalciferol 125 MCG (5000 UT) capsule Take 5,000 Units by mouth daily.     doxylamine, Sleep, (UNISOM) 25 MG tablet Take 25 mg by mouth at bedtime as needed.     glipiZIDE (GLUCOTROL) 10 MG tablet TAKE 1 TABLET(10 MG) BY MOUTH TWICE DAILY BEFORE A MEAL 180 tablet 0   glucose blood (ACCU-CHEK GUIDE) test strip USE AS DIRECTED ONCE DAILY 100 strip 0   metFORMIN (GLUCOPHAGE) 850 MG tablet TAKE 1 TABLET(850 MG) BY MOUTH TWICE DAILY WITH A MEAL 180 tablet 1   metoprolol tartrate (LOPRESSOR) 25 MG tablet Take 1 tablet (25 mg total) by mouth 2 (two) times daily. 180 tablet 0   rosuvastatin (CRESTOR) 20 MG tablet TAKE 1 TABLET(20 MG) BY MOUTH DAILY 90 tablet 3   No current facility-administered medications for this visit.    No Known Allergies  Family History  Problem Relation Age of Onset   Alcoholism Mother    Alcoholism Father    Pancreatic cancer Brother    Prostate cancer Neg Hx    Kidney disease Neg Hx    Cancer - Colon Neg Hx  Social History   Socioeconomic History   Marital status: Single    Spouse name: Not on file   Number of children: 2   Years of education: Not on file   Highest education level: Not on file  Occupational History   Occupation: [employer]    Employer: [employer]    Comment: retired  Tobacco Use   Smoking status: Never   Smokeless tobacco: Never  Vaping Use   Vaping Use: Never used  Substance and Sexual Activity   Alcohol use: No    Alcohol/week: 0.0 standard drinks of alcohol    Comment: rare   Drug use: No   Sexual activity: Not Currently  Other Topics Concern   Not on file  Social History Narrative   Lives with girlfriend, April   Social Determinants of Health   Financial Resource Strain: Low Risk  (01/01/2022)   Overall Financial Resource  Strain (CARDIA)    Difficulty of Paying Living Expenses: Not very hard  Food Insecurity: No Food Insecurity (01/01/2022)   Hunger Vital Sign    Worried About Running Out of Food in the Last Year: Never true    [place] in the Last Year: Never true  Transportation Needs: No Transportation Needs (01/01/2022)   PRAPARE - Hydrologist (Medical): No    Lack of Transportation (Non-Medical): No  Physical Activity: Sufficiently Active (01/01/2022)   Exercise Vital Sign    Days of Exercise per Week: 4 days    Minutes of Exercise per Session: 80 min  Stress: No Stress Concern Present (01/01/2022)   [place]    Feeling of Stress : Only a little  Social Connections: Moderately Integrated (01/01/2022)   Social Connection and Isolation Panel [NHANES]    Frequency of Communication with Friends and Family: More than three times a week    Frequency of Social Gatherings with Friends and Family: Once a week    Attends Religious Services: 1 to 4 times per year    Active Member of [employer] or Organizations: No    Attends Archivist Meetings: Never    Marital Status: Living with partner  Intimate Partner Violence: Not At Risk (01/01/2022)   Humiliation, Afraid, Rape, and Kick questionnaire    Fear of Current or Ex-Partner: No    Emotionally Abused: No    Physically Abused: No    Sexually Abused: No     Constitutional: Denies fever, malaise, fatigue, headache or abrupt weight changes.  HEENT: Denies eye pain, eye redness, ear pain, ringing in the ears, wax buildup, runny nose, nasal congestion, bloody nose, or sore throat. Respiratory: Denies difficulty breathing, shortness of breath, cough or sputum production.   Cardiovascular: Denies chest pain, chest tightness, palpitations or swelling in the hands or feet.  Gastrointestinal: Denies abdominal pain, bloating, constipation, diarrhea or blood  in the stool.  GU: Pt reports urinary frequency, nocturia. Denies urgency, pain with urination, burning sensation, blood in urine, odor or discharge. Musculoskeletal: Denies decrease in range of motion, difficulty with gait, muscle pain or joint pain and swelling.  Skin: Denies redness, rashes, lesions or ulcercations.  Neurological: Denies dizziness, difficulty with memory, difficulty with speech or problems with balance and coordination.  Psych: Denies anxiety, depression, SI/HI.  No other specific complaints in a complete review of systems (except as listed in HPI above).     Objective:   Physical Exam  BP 118/70 (BP  Location: Right Arm, Patient Position: Sitting, Cuff Size: Large)   Pulse 77   Temp (!) 96.4 F (35.8 C) (Temporal)   Wt 285 lb (129.3 kg)   SpO2 99%   BMI 37.60 kg/m   Wt Readings from Last 3 Encounters:  01/07/22 280 lb (127 kg)  12/09/21 285 lb (129.3 kg)  11/19/21 286 lb (129.7 kg)    General: Appears his stated age, obese, in NAD. Skin: Warm, dry and intact. No ulcerations noted. HEENT: Head: normal shape and size; Eyes: sclera white, no icterus, conjunctiva pink, PERRLA and EOMs intact;  Cardiovascular: Normal rate and rhythm. S1,S2 noted.  No murmur, rubs or gallops noted. No JVD or BLE edema. No carotid bruits noted. Pulmonary/Chest: Normal effort and positive vesicular breath sounds. No respiratory distress. No wheezes, rales or ronchi noted.  Musculoskeletal:  No difficulty with gait.  Neurological: Alert and oriented. Coordination normal.     BMET    Component Value Date/Time   NA 141 01/07/2022 0917   NA 139 03/30/2020 1015   NA 139 04/21/2012 0641   K 4.5 01/07/2022 0917   K 3.8 04/21/2012 0641   CL 104 01/07/2022 0917   CL 107 04/21/2012 0641   CO2 27 01/07/2022 0917   CO2 27 04/21/2012 0641   GLUCOSE 152 (H) 01/07/2022 0917   GLUCOSE 212 (H) 04/21/2012 0641   BUN 19 01/07/2022 0917   BUN 14 03/30/2020 1015   BUN 22 (H) 04/21/2012  0641   CREATININE 1.00 01/07/2022 0917   CALCIUM 10.1 01/07/2022 0917   CALCIUM 8.7 04/21/2012 0641   GFRNONAA >60 06/23/2021 2133   GFRNONAA 51 (L) 04/21/2012 0641   GFRAA 100 03/30/2020 1015   GFRAA 59 (L) 04/21/2012 0641    Lipid Panel     Component Value Date/Time   CHOL 150 01/07/2022 0917   CHOL 128 03/30/2020 1015   TRIG 86 01/07/2022 0917   HDL 49 01/07/2022 0917   HDL 41 03/30/2020 1015   CHOLHDL 3.1 01/07/2022 0917   VLDL 14 08/20/2017 0359   LDLCALC 83 01/07/2022 0917    CBC    Component Value Date/Time   WBC 6.4 01/07/2022 0917   RBC 5.55 01/07/2022 0917   HGB 13.9 01/07/2022 0917   HGB 14.0 03/30/2020 1015   HCT 44.2 01/07/2022 0917   HCT 42.5 03/30/2020 1015   PLT 315 01/07/2022 0917   PLT 306 03/30/2020 1015   MCV 79.6 (L) 01/07/2022 0917   MCV 79 03/30/2020 1015   MCV 80 04/21/2012 0641   MCH 25.0 (L) 01/07/2022 0917   MCHC 31.4 (L) 01/07/2022 0917   RDW 14.5 01/07/2022 0917   RDW 14.6 03/30/2020 1015   RDW 14.9 (H) 04/21/2012 0641   LYMPHSABS 1.8 03/30/2020 1015   MONOABS 0.5 04/03/2019 0757   EOSABS 0.3 03/30/2020 1015   BASOSABS 0.1 03/30/2020 1015    Hgb A1C Lab Results  Component Value Date   HGBA1C 7.7 (H) 01/07/2022           Assessment & Plan:     RTC in 3 for follow up chronic conditions Webb Silversmith, NP

## 2022-03-27 NOTE — Assessment & Plan Note (Signed)
POCT A1c 8.7% Urine microalbumin has been checked within the last year Continue metformin and glipizide He admits that he does not know what to eat and what not to eat, offered referral to diabetes education and nutrition which he declines at this time Chart printed out for the glycemic index Encouraged routine eye exam Encouraged routine foot exam Immunizations UTD Encouraged him to get his COVID booster

## 2022-03-31 ENCOUNTER — Ambulatory Visit (INDEPENDENT_AMBULATORY_CARE_PROVIDER_SITE_OTHER): Payer: Medicare HMO | Admitting: Pharmacist

## 2022-03-31 DIAGNOSIS — I10 Essential (primary) hypertension: Secondary | ICD-10-CM

## 2022-03-31 DIAGNOSIS — E119 Type 2 diabetes mellitus without complications: Secondary | ICD-10-CM

## 2022-03-31 NOTE — Chronic Care Management (AMB) (Signed)
Chronic Care Management CCM Pharmacy Note  03/31/2022 Name:  Daniel Bradley. MRN:  [id number] DOB:  07-Aug-1955   Subjective: Daniel Bradley. is an 67 y.o. year old male who is a primary patient of Daniel Fenton, NP.  The CCM team was consulted for assistance with disease management and care coordination needs.    Engaged with patient by telephone for follow up visit for pharmacy case management and/or care coordination services.   Objective:  Medications Reviewed Today     Reviewed by Rennis Petty, RPH-CPP (Pharmacist) on 03/31/22 at 1337  Med List Status: <None>   Medication Order Taking? Sig Documenting Provider Last Dose Status Informant  Accu-Chek Softclix Lancets lancets [id number]  USE AS DIRECTED ONCE DAILY Garnette Gunner Daniel Keens, NP  Active   amLODipine (NORVASC) 10 MG tablet [id number] Yes Take 1 tablet (10 mg total) by mouth daily. Daniel Fenton, NP Taking Active   aspirin EC 81 MG tablet [id number]  81 mg daily. Swallow whole. [provider]  Active   blood glucose meter kit and supplies [id number]  Dispense based on patient and insurance preference. Use to check blood sugar daily as directed. DX Ell.9 Daniel Fenton, NP  Active   Cholecalciferol 125 MCG (5000 UT) capsule [id number]  Take 5,000 Units by mouth daily. [provider]  Active   doxylamine, Sleep, (UNISOM) 25 MG tablet [id number]  Take 25 mg by mouth at bedtime as needed. [provider]  Active   glipiZIDE (GLUCOTROL) 10 MG tablet [id number] Yes TAKE 1 TABLET(10 MG) BY MOUTH TWICE DAILY BEFORE A MEAL Baity, Daniel Keens, NP Taking Active   glucose blood (ACCU-CHEK GUIDE) test strip [id number]  USE AS DIRECTED ONCE DAILY Daniel Fenton, NP  Active   metFORMIN (GLUCOPHAGE) 850 MG tablet [id number] Yes TAKE 1 TABLET(850 MG) BY MOUTH TWICE DAILY WITH A MEAL Baity, Daniel Keens, NP Taking Active   metoprolol tartrate (LOPRESSOR) 25 MG tablet [id number] Yes Take 1 tablet (25 mg total) by mouth 2  (two) times daily. Daniel Fenton, NP Taking Active   rosuvastatin (CRESTOR) 20 MG tablet [id number]  TAKE 1 TABLET(20 MG) BY MOUTH DAILY Baity, Daniel Keens, NP  Active   Semaglutide (RYBELSUS) 3 MG TABS [id number] Yes Take 3 mg by mouth daily. [provider]  Active             Pertinent Labs:  Lab Results  Component Value Date   HGBA1C 8.7 (A) 03/27/2022   Lab Results  Component Value Date   CHOL 150 01/07/2022   HDL 49 01/07/2022   LDLCALC 83 01/07/2022   TRIG 86 01/07/2022   CHOLHDL 3.1 01/07/2022   Lab Results  Component Value Date   CREATININE 1.00 01/07/2022   BUN 19 01/07/2022   NA 141 01/07/2022   K 4.5 01/07/2022   CL 104 01/07/2022   CO2 27 01/07/2022   BP Readings from Last 3 Encounters:  03/27/22 118/70  01/07/22 (!) 146/80  12/09/21 134/76   Pulse Readings from Last 3 Encounters:  03/27/22 77  01/07/22 89  12/09/21 91     SDOH:  (Social Determinants of Health) assessments and interventions performed:  SDOH Interventions    Flowsheet Row Office Visit from 03/27/2022 in [facility] Office Visit from 01/07/2022 in [place] Management from 01/01/2022 in [facility] Office Visit from 12/09/2021 in [facility]  Clinical Support from 08/29/2021 in [facility] Office Visit from 03/19/2021 in [facility]  SDOH Interventions        Food Insecurity Interventions -- -- Intervention Not Indicated -- Intervention Not Indicated --  Housing Interventions -- -- Intervention Not Indicated -- Intervention Not Indicated --  Transportation Interventions -- -- Intervention Not Indicated -- Intervention Not Indicated --  Utilities Interventions -- -- Intervention Not Indicated -- -- --  Alcohol Usage Interventions -- -- Intervention Not Indicated (Score <7) -- -- --  Depression  Interventions/Treatment  Patient refuses Treatment Patient refuses Treatment -- Patient refuses Treatment PHQ2-9 Score <4 Follow-up Not Indicated Medication  Financial Strain Interventions -- -- Other (Comment)  [needs assistance with paying for Tresiba, pharm D consult pending] -- Intervention Not Indicated --  Physical Activity Interventions -- -- Intervention Not Indicated, Other (Comments)  [goes to the gym, 3 or 4 days a week, does walking, bench presses, weights, and knee presses] -- Intervention Not Indicated --  Stress Interventions -- -- Intervention Not Indicated -- Intervention Not Indicated --  Social Connections Interventions -- -- Intervention Not Indicated, Other (Comment)  [lives with his girlfriend April, the patient has been divorced 3 times, good support system, has 2 sons] -- Intervention Not Indicated --       CCM Care Plan  Review of patient past medical history, allergies, medications, health status, including review of consultants reports, laboratory and other test data, was performed as part of comprehensive evaluation and provision of chronic care management services.   Care Plan : General Pharmacy (Adult)  Updates made by Rennis Petty, RPH-CPP since 03/31/2022 12:00 AM     Problem: Disease Progression      Long-Range Goal: Disease Progression Prevented or Minimized   Start Date: 01/10/2022  Expected End Date: 04/10/2022  Recent Progress: On track  Priority: High  Note:   Current Barriers:  Unable to independently afford treatment regimen Unable to achieve control of A1C Unable to achieve control of blood pressure   Pharmacist Clinical Goal(s):  patient will verbalize ability to afford treatment regimen achieve control of T2DM as evidenced by A1C  through collaboration with PharmD and provider achieve control of HTN as evidenced by blood pressure through collaboration with PharmD and provider.    Interventions: 1:1 collaboration with Daniel Fenton, NP regarding development and update of comprehensive plan of care as evidenced by provider attestation and co-signature Inter-disciplinary care team collaboration (see longitudinal plan of care) Reports takes his medications directly from pill bottles and has a routine to aid with adherence; denies missed doses Again encourage patient to start using weekly pillbox as adherence aid  Diabetes:  Uncontrolled based on latest A1C; current treatment: Glipizide 10 mg twice daily before breakfast and supper (30 minutes prior to meal) Metformin 850 mg twice daily Medications tried in the past: Rybelsus (cost); Ozempic (cost); [name] (cost) Current glucose readings ranging: 120-181 Denies checking in the past few days because he ran out of test strips, but confirms picked up a refill and will restart testing Attributes elevated readings to days when he had a carbohydrate snack/dessert the night before Denies hypoglycemic symptoms Current exercise: goes to gym ~80-90 mins walks, chest press, knee press x 3-4 days/week Statin: rosuvastatin 20 mg daily Reviewed long term cardiovascular and renal outcomes of uncontrolled blood sugar Reviewed goal A1c, goal fasting, and goal 2 hour post prandial glucose Reviewed dietary modifications including: Encourage patient to have  regular well-balance meals throughout the day (including breakfast), while controlling carbohydrate portion sizes Discuss strategies for reducing carbohydrate portion sizes of snacks Encourage patient to review nutrition labels for carbohydrate content of foods Recommend to check glucose, keep a log of the results, have this record to review during future appointments, but to contact office sooner if needed for readings outside of established parameters or symptoms Have counseled patient on s/s of low blood sugar and how to treat lows Encourage patient to pick up glucose tablets to carry with him in case needed for hypoglycemia Patient  interested in restarting Rybelsus as discussed with PCP, but medication unaffordable when in coverage gap of [facility] Medicare plan. Based on reported income, patient meets criteria for Rybelsus patient assistance from [employer] with [facility] CPhT Susy Frizzle and PCP to assist patient with with patient assistance application process Today received message from [place] who confirms received both patient and provider portion of application back, but requesting to confirm dose of Rybelsus ordered by provider through program Send message to PCP to request confirmation  Collaborate with RPh at [facility] for current cost (in new calendar year) of initial dose of Rybelsus 3 mg. [facility] RPh advises cost is $45 for 30 day supply Patient states cost of 28-monthsupply of Rybelsus 3 mg daily affordable for him and will pick up today and restart taking as directed tomorrow Review with patient importance of taking Rybelsus on an empty stomach, ?30 minutes before the first food, beverage, or other oral medications of the day with ?4 oz of plain water only  Hypertension:  Goal on Track (progressing): YES. Uncontrolled based on latest in office reading; current treatment: amlodipine 10 mg daily metoprolol 25 mg twice daily Reports using home wrist blood pressure monitor Reports obtained upper arm BP monitor, but has not yet started using Reports last checked home blood pressure on 2/12, reading: 119/73, HR 73 Denies hypotensive symptoms such as dizziness or lightheadedness Current exercise: goes to gym ~80-90 mins walks, chest press, knee press x 3-4 days/week Encourage patient to setup and start using upper arm home blood pressure monitor for greater accuracy of readings.  Reviewed long term cardiovascular and renal outcomes of uncontrolled blood pressure Reviewed appropriate blood pressure monitoring technique and reviewed goal blood pressure Encourage patient to limit salt/sodium  intake Recommended to check home blood pressure and heart rate using upper arm monitor, keep a log of the results and have this record to review during future appointments   Patient Goals/Self-Care Activities patient will:  - take medications as prescribed as evidenced by patient report and record review - check glucose , document, and provide at future appointments - check blood pressure, document, and provide at future appointments - collaborate with provider on medication access solutions       Plan: Telephone follow up appointment with care management team member scheduled for:  04/21/2022 at 10 am  EWallace Cullens PharmD, [phone]

## 2022-03-31 NOTE — Patient Instructions (Signed)
Visit Information  Thank you for taking time to visit with me today. Please don't hesitate to contact me if I can be of assistance to you before our next scheduled telephone appointment.  Following are the goals we discussed today:   Goals Addressed             This Visit's Progress    Pharmacy Goals       Our goal A1c is less than 7%. This corresponds with fasting sugars less than 130 and 2 hour after meal sugars less than 180. Please keep a log of your results when checking your blood sugar   Our goal bad cholesterol, or LDL, is less than 70 . This is why it is important to continue taking your rosuvastatin.  Check your blood pressure twice weekly, and any time you have concerning symptoms like headache, chest pain, dizziness, shortness of breath, or vision changes.   Our goal is less than 130/80.  To appropriately check your blood pressure, make sure you do the following:  1) Avoid caffeine, exercise, or tobacco products for 30 minutes before checking. Empty your bladder. 2) Sit with your back supported in a flat-backed chair. Rest your arm on something flat (arm of the chair, table, etc). 3) Sit still with your feet flat on the floor, resting, for at least 5 minutes.  4) Check your blood pressure. Take 1-2 readings.  5) Write down these readings and bring with you to any provider appointments.  Bring your home blood pressure machine with you to a provider's office for accuracy comparison at least once a year.   Make sure you take your blood pressure medications before you come to any office visit, even if you were asked to fast for labs.   [name], PharmD, [place] [phone]          Our next appointment is by telephone on 04/21/2022 at 10 am  Please call the care guide team at [phone] if you need to cancel or reschedule your appointment.    Patient verbalizes understanding of instructions  and care plan provided today and agrees to view in [place]. Active MyChart status and patient understanding of how to access instructions and care plan via MyChart confirmed with patient.

## 2022-04-02 ENCOUNTER — Other Ambulatory Visit: Payer: Self-pay | Admitting: Internal Medicine

## 2022-04-02 NOTE — Telephone Encounter (Signed)
Requested Prescriptions  Pending Prescriptions Disp Refills   amLODipine (NORVASC) 10 MG tablet [Pharmacy Med Name: AMLODIPINE BESYLATE 10MG TABLETS] 90 tablet 0    Sig: TAKE 1 TABLET(10 MG) BY MOUTH DAILY     Cardiovascular: Calcium Channel Blockers 2 Passed - 04/02/2022 12:55 PM      Passed - Last BP in normal range    BP Readings from Last 1 Encounters:  03/27/22 118/70         Passed - Last Heart Rate in normal range    Pulse Readings from Last 1 Encounters:  03/27/22 77         Passed - Valid encounter within last 6 months    Recent Outpatient Visits           6 days ago Type 2 diabetes mellitus without complication, without long-term current use of insulin [facility])   [facility] [place], Coralie Keens, NP   2 months ago Encounter for general adult medical examination with abnormal findings   [facility] [place], [place] W, NP   3 months ago Type 2 diabetes mellitus with other specified complication, without long-term current use of insulin [facility])   [facility] [place], Coralie Keens, NP   5 months ago Essential hypertension   [facility] [place], [place] W, NP   6 months ago Type 2 diabetes mellitus with other specified complication, without long-term current use of insulin [facility])   [facility] [place], Coralie Keens, NP       Future Appointments             In 7 months Hollice Espy, MD [place]

## 2022-04-06 ENCOUNTER — Other Ambulatory Visit: Payer: Self-pay | Admitting: Internal Medicine

## 2022-04-10 DIAGNOSIS — E119 Type 2 diabetes mellitus without complications: Secondary | ICD-10-CM

## 2022-04-10 DIAGNOSIS — I10 Essential (primary) hypertension: Secondary | ICD-10-CM

## 2022-04-11 ENCOUNTER — Ambulatory Visit (INDEPENDENT_AMBULATORY_CARE_PROVIDER_SITE_OTHER): Payer: Medicare HMO

## 2022-04-11 ENCOUNTER — Telehealth: Payer: Self-pay | Admitting: Pharmacy Technician

## 2022-04-11 ENCOUNTER — Telehealth: Payer: Medicare HMO

## 2022-04-11 DIAGNOSIS — E119 Type 2 diabetes mellitus without complications: Secondary | ICD-10-CM

## 2022-04-11 DIAGNOSIS — Z596 Low income: Secondary | ICD-10-CM

## 2022-04-11 DIAGNOSIS — E7849 Other hyperlipidemia: Secondary | ICD-10-CM

## 2022-04-11 NOTE — Patient Instructions (Signed)
Please call the care guide team at [phone] if you need to cancel or reschedule your appointment.   If you are experiencing a Mental Health or [place] or need someone to talk to, please call the Suicide and Crisis Lifeline: 988 call the [place] National Suicide Prevention Lifeline: 870-691-5878 or TTY: 816-612-0539 TTY 9497094831) to talk to a trained counselor call 1-800-273-TALK (toll free, 24 hour hotline)   Following is a copy of the CCM Program Consent:  CCM service includes personalized support from designated clinical staff supervised by the physician, including individualized plan of care and coordination with other care providers 24/7 contact phone numbers for assistance for urgent and routine care needs. Service will only be billed when office clinical staff spend 20 minutes or more in a month to coordinate care. Only one practitioner may furnish and bill the service in a calendar month. The patient may stop CCM services at amy time (effective at the end of the month) by phone call to the office staff. The patient will be responsible for cost sharing (co-pay) or up to 20% of the service fee (after annual deductible is met)  Following is a copy of your full provider care plan:   Goals Addressed             This Visit's Progress    CCM Expected Outcome:  Monitor, Self-Manage and Reduce Symptoms of Diabetes       Current Barriers:  Knowledge Deficits related to the significance of controlling blood sugars and keeping A1C levels down Care Coordination needs related to medication cost constraints  in a patient with DM Chronic Disease Management support and education needs related to effective management of DM Financial Constraints.  Lab Results  Component Value Date   HGBA1C 8.7 (A) 03/27/2022     Planned Interventions: Provided education to patient about basic DM disease process. Review and education provided. The patient with elevated A1C. He knows he  needs to get it down or it could impact his health in a negative way. Education on medication adherence and monitoring dietary intake.; Reviewed medications with patient and discussed importance of medication adherence. The patient is compliant with medications. The patient has been approved for Rybelsus and is thankful for this. This is a medication that helped him a lot in the past. Review of the notes and the patient will be able to pick up the medications at the office when they are in. Review of pharm D follow up in a couple of weeks. The patient also has the [facility] number to call for assistance as well  ;        Reviewed prescribed diet with patient heart healthy/ADA diet.  He admits sweets are his weak points. Education provided on eating balanced meals and monitoring for abnormal readings. The patient is being more mindful of his eating habits.   Counseled on importance of regular laboratory monitoring as prescribed. Has regular lab work done;        Discussed plans with patient for ongoing care management follow up and provided patient with direct contact information for care management team;      Provided patient with written educational materials related to hypo and hyperglycemia and importance of correct treatment. The patient states the highest his blood sugars has been is 157 and the lowest it has been is 119. The patient denies any sx and sx of hypo or hyperglycemia. Review and education provided. States he does notice his blood sugar level dropping after he  has exercised. Education provided.        Reviewed scheduled/upcoming provider appointments including:  Reminder given to schedule regular follow ups with the pcp.  Advised patient, providing education and rationale, to check cbg twice daily and when you have symptoms of low or high blood sugar and record. The patient is taking his blood sugars. Gave several readings to the pharm D on 02-19-2022.  Review of the goal of fasting is <130 and post  prandial of <180.   Did not have blood sugar readings with him as he was not at home at the time of the call but states his blood sugars are doing well. Lowest he has seen is 119 and the highest is 157.   call provider for findings outside established parameters;       Referral made to pharmacy team for assistance with assistance with cost constraints related to Rybelsus and inability to pay for refills. The patient is working with the pharm D for medication cost constraints. The patient has been approved for Rybelsus and will be covered through 2024.  Review of patient status, including review of consultants reports, relevant laboratory and other test results, and medications completed;       Advised patient to discuss changes in DM health and well being with provider;      Screening for signs and symptoms of depression related to chronic disease state;        Assessed social determinant of health barriers;         Symptom Management: Take medications as prescribed   Attend all scheduled provider appointments Call provider office for new concerns or questions  call the Suicide and Crisis Lifeline: 988 call the [place] National Suicide Prevention Lifeline: 719-102-5633 or TTY: (817)495-4093 TTY 289-805-7389) to talk to a trained counselor call 1-800-273-TALK (toll free, 24 hour hotline) if experiencing a Mental Health or [place]  check feet daily for cuts, sores or redness trim toenails straight across manage portion size wash and dry feet carefully every day wear comfortable, cotton socks wear comfortable, well-fitting shoes  Follow Up Plan: Telephone follow up appointment with care management team member scheduled for: 06-13-2022 at 145 pm       CCM Expected Outcome:  Monitor, Self-Manage and Reduce Symptoms of: HLD       Current Barriers:  Chronic Disease Management support and education needs related to effective management of HLD BP Readings from Last 3 Encounters:   03/27/22 118/70  01/07/22 (!) 146/80  12/09/21 134/76    Lab Results  Component Value Date   CHOL 150 01/07/2022   CHOL 229 (H) 10/15/2021   CHOL 198 06/20/2021   Lab Results  Component Value Date   HDL 49 01/07/2022   HDL 49 10/15/2021   HDL 51 06/20/2021   Lab Results  Component Value Date   LDLCALC 83 01/07/2022   LDLCALC 152 (H) 10/15/2021   [place] 118 (H) 06/20/2021   Lab Results  Component Value Date   TRIG 86 01/07/2022   TRIG 153 (H) 10/15/2021   TRIG 177 (H) 06/20/2021   Lab Results  Component Value Date   CHOLHDL 3.1 01/07/2022   CHOLHDL 4.7 10/15/2021   CHOLHDL 3.9 06/20/2021    Planned Interventions: Provider established cholesterol goals reviewed.Review of goals. Review of goals and the patient is at goal currently. Review of making sure he is taking his medications and following the plan of care established by the provider.  ; Counseled on importance of regular  laboratory monitoring as prescribed. Has lab work done on a regular basis; Provided HLD Scientist, clinical (histocompatibility and immunogenetics). Education and support given; Reviewed role and benefits of statin for ASCVD risk reduction; Discussed strategies to manage statin-induced myalgias; Reviewed importance of limiting foods high in cholesterol. Review of heart healthy/ADA diet-He is trying to be more mindful of foods that are not good for him. Sweets is his weakness, is doing better with managing his dietary restrictions. Will continue to monitor for changes; Reviewed exercise goals and target of 150 minutes per week. Patient goes to the gym and works out on a regular basis. Education and support given; Screening for signs and symptoms of depression related to chronic disease state;  Assessed social determinant of health barriers;   Symptom Management: Take medications as prescribed   Attend all scheduled provider appointments Call provider office for new concerns or questions  call the Suicide and Crisis Lifeline:  988 call the [place] National Suicide Prevention Lifeline: 423-268-0339 or TTY: (513)734-1846 TTY (541)137-7305) to talk to a trained counselor call 1-800-273-TALK (toll free, 24 hour hotline) if experiencing a Mental Health or [place]  - call for medicine refill 2 or 3 days before it runs out - take all medications exactly as prescribed - call doctor with any symptoms you believe are related to your medicine - call doctor when you experience any new symptoms - go to all doctor appointments as scheduled - adhere to prescribed diet: heart healthy/ADA diet   Follow Up Plan: Telephone follow up appointment with care management team member scheduled for: 06-13-2022 at 145 pm          Patient verbalizes understanding of instructions and care plan provided today and agrees to view in [place]. Active MyChart status and patient understanding of how to access instructions and care plan via MyChart confirmed with patient.     Telephone follow up appointment with care management team member scheduled for: 06-13-2022 at 145 pm

## 2022-04-11 NOTE — Progress Notes (Signed)
[facility])                                            [place] Team    04/11/2022  Clovis Madge. 1955/03/25 [id number]  Care coordination call placed to [place] in regard to Rybelsus application.  Spoke to [place] who informs patient is APPROVED 04/02/22-02/10/23. Medication will be delivered to the provider's address on the application.  Amaree Leeper P. Adela Esteban, [place]  [phone]

## 2022-04-11 NOTE — Chronic Care Management (AMB) (Signed)
Chronic Care Management   CCM RN Visit Note  04/11/2022 Name: Daniel Bradley. MRN: [id number] DOB: September 17, 1955  Subjective: Daniel Zeisloft. is a 67 y.o. year old male who is a primary care patient of [name], NP. The patient was referred to the Chronic Care Management team for assistance with care management needs subsequent to provider initiation of CCM services and plan of care.    Today's Visit:  Engaged with patient by telephone for follow up visit.        Goals Addressed             This Visit's Progress    CCM Expected Outcome:  Monitor, Self-Manage and Reduce Symptoms of Diabetes       Current Barriers:  Knowledge Deficits related to the significance of controlling blood sugars and keeping A1C levels down Care Coordination needs related to medication cost constraints  in a patient with DM Chronic Disease Management support and education needs related to effective management of DM Financial Constraints.  Lab Results  Component Value Date   HGBA1C 8.7 (A) 03/27/2022     Planned Interventions: Provided education to patient about basic DM disease process. Review and education provided. The patient with elevated A1C. He knows he needs to get it down or it could impact his health in a negative way. Education on medication adherence and monitoring dietary intake.; Reviewed medications with patient and discussed importance of medication adherence. The patient is compliant with medications. The patient has been approved for Rybelsus and is thankful for this. This is a medication that helped him a lot in the past. Review of the notes and the patient will be able to pick up the medications at the office when they are in. Review of pharm D follow up in a couple of weeks. The patient also has the [facility] number to call for assistance as well  ;        Reviewed prescribed diet with patient heart healthy/ADA diet.  He admits sweets are his weak points. Education provided on eating  balanced meals and monitoring for abnormal readings. The patient is being more mindful of his eating habits.   Counseled on importance of regular laboratory monitoring as prescribed. Has regular lab work done;        Discussed plans with patient for ongoing care management follow up and provided patient with direct contact information for care management team;      Provided patient with written educational materials related to hypo and hyperglycemia and importance of correct treatment. The patient states the highest his blood sugars has been is 157 and the lowest it has been is 119. The patient denies any sx and sx of hypo or hyperglycemia. Review and education provided. States he does notice his blood sugar level dropping after he has exercised. Education provided.        Reviewed scheduled/upcoming provider appointments including:  Reminder given to schedule regular follow ups with the pcp.  Advised patient, providing education and rationale, to check cbg twice daily and when you have symptoms of low or high blood sugar and record. The patient is taking his blood sugars. Gave several readings to the pharm D on 02-19-2022.  Review of the goal of fasting is <130 and post prandial of <180.   Did not have blood sugar readings with him as he was not at home at the time of the call but states his blood sugars are doing well. Lowest he has seen is 119  and the highest is 157.   call provider for findings outside established parameters;       Referral made to pharmacy team for assistance with assistance with cost constraints related to Rybelsus and inability to pay for refills. The patient is working with the pharm D for medication cost constraints. The patient has been approved for Rybelsus and will be covered through 2024.  Review of patient status, including review of consultants reports, relevant laboratory and other test results, and medications completed;       Advised patient to discuss changes in DM health  and well being with provider;      Screening for signs and symptoms of depression related to chronic disease state;        Assessed social determinant of health barriers;         Symptom Management: Take medications as prescribed   Attend all scheduled provider appointments Call provider office for new concerns or questions  call the Suicide and Crisis Lifeline: 988 call the [place] National Suicide Prevention Lifeline: 240-436-9228 or TTY: 857 117 3283 TTY 320-187-1276) to talk to a trained counselor call 1-800-273-TALK (toll free, 24 hour hotline) if experiencing a Mental Health or [place]  check feet daily for cuts, sores or redness trim toenails straight across manage portion size wash and dry feet carefully every day wear comfortable, cotton socks wear comfortable, well-fitting shoes  Follow Up Plan: Telephone follow up appointment with care management team member scheduled for: 06-13-2022 at 145 pm       CCM Expected Outcome:  Monitor, Self-Manage and Reduce Symptoms of: HLD       Current Barriers:  Chronic Disease Management support and education needs related to effective management of HLD BP Readings from Last 3 Encounters:  03/27/22 118/70  01/07/22 (!) 146/80  12/09/21 134/76    Lab Results  Component Value Date   CHOL 150 01/07/2022   CHOL 229 (H) 10/15/2021   CHOL 198 06/20/2021   Lab Results  Component Value Date   HDL 49 01/07/2022   HDL 49 10/15/2021   HDL 51 06/20/2021   Lab Results  Component Value Date   LDLCALC 83 01/07/2022   LDLCALC 152 (H) 10/15/2021   [place] 118 (H) 06/20/2021   Lab Results  Component Value Date   TRIG 86 01/07/2022   TRIG 153 (H) 10/15/2021   TRIG 177 (H) 06/20/2021   Lab Results  Component Value Date   CHOLHDL 3.1 01/07/2022   CHOLHDL 4.7 10/15/2021   CHOLHDL 3.9 06/20/2021    Planned Interventions: Provider established cholesterol goals reviewed.Review of goals. Review of goals and the patient is  at goal currently. Review of making sure he is taking his medications and following the plan of care established by the provider.  ; Counseled on importance of regular laboratory monitoring as prescribed. Has lab work done on a regular basis; Provided HLD Scientist, clinical (histocompatibility and immunogenetics). Education and support given; Reviewed role and benefits of statin for ASCVD risk reduction; Discussed strategies to manage statin-induced myalgias; Reviewed importance of limiting foods high in cholesterol. Review of heart healthy/ADA diet-He is trying to be more mindful of foods that are not good for him. Sweets is his weakness, is doing better with managing his dietary restrictions. Will continue to monitor for changes; Reviewed exercise goals and target of 150 minutes per week. Patient goes to the gym and works out on a regular basis. Education and support given; Screening for signs and symptoms of depression related to chronic disease  state;  Assessed social determinant of health barriers;   Symptom Management: Take medications as prescribed   Attend all scheduled provider appointments Call provider office for new concerns or questions  call the Suicide and Crisis Lifeline: 988 call the [place] National Suicide Prevention Lifeline: 219-485-3433 or TTY: 920-329-8091 TTY 262-406-7480) to talk to a trained counselor call 1-800-273-TALK (toll free, 24 hour hotline) if experiencing a Mental Health or [place]  - call for medicine refill 2 or 3 days before it runs out - take all medications exactly as prescribed - call doctor with any symptoms you believe are related to your medicine - call doctor when you experience any new symptoms - go to all doctor appointments as scheduled - adhere to prescribed diet: heart healthy/ADA diet   Follow Up Plan: Telephone follow up appointment with care management team member scheduled for: 06-13-2022 at 145 pm          Plan:Telephone follow up appointment with care  management team member scheduled for:  06-13-2022 at 145 pm  Noreene Larsson RN, MSN, CCM RN Care Manager  Chronic Care Management Direct Number: [phone]

## 2022-04-18 ENCOUNTER — Other Ambulatory Visit: Payer: Self-pay

## 2022-04-18 DIAGNOSIS — E119 Type 2 diabetes mellitus without complications: Secondary | ICD-10-CM

## 2022-04-18 MED ORDER — BD SWAB SINGLE USE REGULAR PADS: MEDICATED_PAD | 1 refills | Status: DC

## 2022-04-18 MED ORDER — METFORMIN HCL 850 MG PO TABS: 850.0000 mg | ORAL_TABLET | Freq: Two times a day (BID) | ORAL | 1 refills | Status: DC

## 2022-04-18 MED ORDER — ACCU-CHEK SOFTCLIX LANCETS MISC: 1 refills | Status: DC

## 2022-04-18 MED ORDER — ROSUVASTATIN CALCIUM 20 MG PO TABS: 20.0000 mg | ORAL_TABLET | Freq: Every day | ORAL | 1 refills | Status: DC

## 2022-04-18 MED ORDER — ACCU-CHEK GUIDE VI STRP: ORAL_STRIP | 0 refills | Status: DC

## 2022-04-18 MED ORDER — AMLODIPINE BESYLATE 10 MG PO TABS: 10.0000 mg | ORAL_TABLET | Freq: Every day | ORAL | 1 refills | Status: DC

## 2022-04-18 MED ORDER — GLIPIZIDE 10 MG PO TABS: 10.0000 mg | ORAL_TABLET | Freq: Two times a day (BID) | ORAL | 1 refills | Status: DC

## 2022-04-21 ENCOUNTER — Ambulatory Visit: Payer: Medicare HMO | Admitting: Pharmacist

## 2022-04-21 DIAGNOSIS — E7849 Other hyperlipidemia: Secondary | ICD-10-CM

## 2022-04-21 DIAGNOSIS — I10 Essential (primary) hypertension: Secondary | ICD-10-CM

## 2022-04-21 DIAGNOSIS — E119 Type 2 diabetes mellitus without complications: Secondary | ICD-10-CM

## 2022-04-21 NOTE — Chronic Care Management (AMB) (Signed)
Chronic Care Management CCM Pharmacy Note  04/21/2022 Name:  Daniel Bradley. MRN:  [id number] DOB:  Jul 23, 1955   Subjective: Daniel Bradley. is an 67 y.o. year old male who is a primary patient of Daniel Fenton, NP.  The CCM team was consulted for assistance with disease management and care coordination needs.    Engaged with patient by telephone for follow up visit for pharmacy case management and/or care coordination services.   Objective:  Medications Reviewed Today     Reviewed by Rennis Petty, RPH-CPP (Pharmacist) on 04/21/22 at 1039  Med List Status: <None>   Medication Order Taking? Sig Documenting Provider Last Dose Status Informant  Accu-Chek Softclix Lancets lancets [id number]  Use to check Blood sugar once a day.  DX: E11.9 Daniel Fenton, NP  Active   Alcohol Swabs (B-D SINGLE USE SWABS REGULAR) PADS [id number]  Use to check blood sugar once a day.  DX: E11.9 Daniel Fenton, NP  Active   amLODipine (NORVASC) 10 MG tablet [id number] Yes Take 1 tablet (10 mg total) by mouth daily. Daniel Fenton, NP Taking Active   aspirin EC 81 MG tablet [id number]  81 mg daily. Swallow whole. [provider]  Active   blood glucose meter kit and supplies [id number]  Dispense based on patient and insurance preference. Use to check blood sugar daily as directed. DX Ell.9 Daniel Fenton, NP  Active   Cholecalciferol 125 MCG (5000 UT) capsule [id number]  Take 5,000 Units by mouth daily. [provider]  Active   doxylamine, Sleep, (UNISOM) 25 MG tablet [id number]  Take 25 mg by mouth at bedtime as needed. [provider]  Active   glipiZIDE (GLUCOTROL) 10 MG tablet [id number] Yes Take 1 tablet (10 mg total) by mouth 2 (two) times daily before a meal. Baity, Coralie Keens, NP Taking Active   glucose blood (ACCU-CHEK GUIDE) test strip [id number]  Use to check blood sugar once a day.  DX: E11.9 Daniel Fenton, NP  Active   metFORMIN (GLUCOPHAGE) 850 MG tablet  [id number] Yes Take 1 tablet (850 mg total) by mouth 2 (two) times daily with a meal. Daniel Fenton, NP Taking Active   metoprolol tartrate (LOPRESSOR) 25 MG tablet [id number] Yes TAKE 1 TABLET(25 MG) BY MOUTH TWICE DAILY Baity, Coralie Keens, NP Taking Active   rosuvastatin (CRESTOR) 20 MG tablet [id number]  Take 1 tablet (20 mg total) by mouth daily. Daniel Fenton, NP  Active   Semaglutide (RYBELSUS) 3 MG TABS [id number] Yes Take 3 mg by mouth daily. [provider] Taking Active             Pertinent Labs:  Lab Results  Component Value Date   HGBA1C 8.7 (A) 03/27/2022   Lab Results  Component Value Date   CHOL 150 01/07/2022   HDL 49 01/07/2022   LDLCALC 83 01/07/2022   TRIG 86 01/07/2022   CHOLHDL 3.1 01/07/2022   Lab Results  Component Value Date   CREATININE 1.00 01/07/2022   BUN 19 01/07/2022   NA 141 01/07/2022   K 4.5 01/07/2022   CL 104 01/07/2022   CO2 27 01/07/2022   BP Readings from Last 3 Encounters:  03/27/22 118/70  01/07/22 (!) 146/80  12/09/21 134/76   Pulse Readings from Last 3 Encounters:  03/27/22 77  01/07/22 89  12/09/21 91     SDOH:  (Social Determinants of Health) assessments and interventions performed:  SDOH Interventions  [place] Office Visit from 03/27/2022 in [facility] Office Visit from 01/07/2022 in [facility] Chronic Care Management from 01/01/2022 in [facility] Office Visit from 12/09/2021 in [facility] Clinical Support from 08/29/2021 in [facility] Office Visit from 03/19/2021 in [facility]  SDOH Interventions        Food Insecurity Interventions -- -- Intervention Not Indicated -- Intervention Not Indicated --  Housing Interventions -- -- Intervention Not Indicated -- Intervention Not Indicated --  Transportation Interventions -- -- Intervention Not  Indicated -- Intervention Not Indicated --  Utilities Interventions -- -- Intervention Not Indicated -- -- --  Alcohol Usage Interventions -- -- Intervention Not Indicated (Score <7) -- -- --  Depression Interventions/Treatment  Patient refuses Treatment Patient refuses Treatment -- Patient refuses Treatment PHQ2-9 Score <4 Follow-up Not Indicated Medication  Financial Strain Interventions -- -- Other (Comment)  [needs assistance with paying for [name] consult pending] -- Intervention Not Indicated --  Physical Activity Interventions -- -- Intervention Not Indicated, Other (Comments)  [goes to the gym, 3 or 4 days a week, does walking, bench presses, weights, and knee presses] -- Intervention Not Indicated --  Stress Interventions -- -- Intervention Not Indicated -- Intervention Not Indicated --  Social Connections Interventions -- -- Intervention Not Indicated, Other (Comment)  [lives with his girlfriend April, the patient has been divorced 3 times, good support system, has 2 sons] -- Intervention Not Indicated --       CCM Care Plan  Review of patient past medical history, allergies, medications, health status, including review of consultants reports, laboratory and other test data, was performed as part of comprehensive evaluation and provision of chronic care management services.   Care Plan : General Pharmacy (Adult)  Updates made by Rennis Petty, RPH-CPP since 04/21/2022 12:00 AM     Problem: Disease Progression      Long-Range Goal: Disease Progression Prevented or Minimized   Start Date: 01/10/2022  Expected End Date: 04/10/2022  Recent Progress: On track  Priority: High  Note:   Current Barriers:  Unable to independently afford treatment regimen Patient approved for patient assistance for Rybelsus from [place] through 02/10/2023 Unable to achieve control of A1C Unable to achieve control of blood pressure   Pharmacist Clinical Goal(s):  patient will  verbalize ability to afford treatment regimen achieve control of T2DM as evidenced by A1C  through collaboration with PharmD and provider achieve control of HTN as evidenced by blood pressure through collaboration with PharmD and provider.    Interventions: 1:1 collaboration with Daniel Fenton, NP regarding development and update of comprehensive plan of care as evidenced by provider attestation and co-signature Inter-disciplinary care team collaboration (see longitudinal plan of care) Reports takes his medications directly from pill bottles and has a routine to aid with adherence; denies missed doses Again encourage patient to start using weekly pillbox as adherence aid Today patient reports he has not been monitoring his blood pressure or blood sugar recently related to feeling down related to arguing with his girlfriend  Also, reports girlfriend broke his blood pressure monitor Denies need to discuss mood with provider to at this time, but agrees to contact provider if symptoms worsen/if needed  Diabetes:  Uncontrolled based on latest A1C; current treatment: Glipizide 10 mg twice daily before breakfast and supper (30 minutes prior to meal)  Metformin 850 mg twice daily Rybelsus 3 mg daily (started ~03/31/2022) Confirms taking on an empty stomach, ?30 minutes before the first food, beverage, or other oral medications of the day with ?4 oz of plain water only Medications tried in the past: Rybelsus (cost); Ozempic (cost); [name] (cost) Denies checking blood sugar recently Denies hypoglycemic symptoms Current exercise: denies exercising recently since has been feeling down, but previously going to gym ~80-90 mins walks, chest press, knee press x 3-4 days/week Statin: rosuvastatin 20 mg daily Have reviewed long term cardiovascular and renal outcomes of uncontrolled blood sugar Have reviewed goal A1c, goal fasting, and goal 2 hour post prandial glucose Have encouraged dietary modifications  including: Encouraged patient to have regular well-balance meals throughout the day (including breakfast), while controlling carbohydrate portion sizes Encouraged patient to review nutrition labels for carbohydrate content of foods Recommend to restart check glucose, keep a log of the results, have this record to review during future appointments, but to contact office sooner if needed for readings outside of established parameters or symptoms Schedule appointment in 2 weeks to follow up for blood sugar readings Have counseled patient on s/s of low blood sugar and how to treat lows Again encourage patient to pick up glucose tablets to carry with him in case needed for hypoglycemia Collaborated with [facility] CPhT Susy Frizzle and PCP to assist patient with applying for patient assistance for Rybelsus from [employer] from [employer] on 3/1 advising that patient approved from program through 02/10/2023 and patient's supply of Rybelsus 7 mg was been shipped to office  Today patient reports he picked up 5 boxes of Rybelsus 7 mg strength (30 count each) from office as sent by assistance program Confirms planning to increase to Rybelsus 7 mg daily once has completed 30 day supply of Rybelsus 3 mg daily  Hypertension:  Goal on Track (progressing): YES. Uncontrolled based on latest in office reading; current treatment: amlodipine 10 mg daily metoprolol 25 mg twice daily Reports upper arm blood pressure monitor broken, but has ordered a new monitor from [facility] over the counter benefit Denies checking home blood pressure recently Denies hypotensive symptoms such as dizziness or lightheadedness Current exercise: denies exercising recently since has been feeling down, but previously going to gym ~80-90 mins walks, chest press, knee press x 3-4 days/week Encourage patient to setup and start using new upper arm home blood pressure monitor once received  Reviewed long term cardiovascular and renal  outcomes of uncontrolled blood pressure Reviewed appropriate blood pressure monitoring technique and reviewed goal blood pressure Encourage patient to limit salt/sodium intake Recommended to check home blood pressure and heart rate using upper arm monitor, keep a log of the results and have this record to review during future appointments   Patient Goals/Self-Care Activities patient will:  - take medications as prescribed as evidenced by patient report and record review - check glucose , document, and provide at future appointments - check blood pressure, document, and provide at future appointments - collaborate with provider on medication access solutions       Plan: Telephone follow up appointment with care management team member scheduled for:  05/05/2022 at 9:15 am  [name], PharmD, [place] [phone]

## 2022-04-21 NOTE — Patient Instructions (Signed)
Visit Information  Thank you for taking time to visit with me today. Please don't hesitate to contact me if I can be of assistance to you before our next scheduled telephone appointment.  Following are the goals we discussed today:   Goals Addressed             This Visit's Progress    Pharmacy Goals       Our goal A1c is less than 7%. This corresponds with fasting sugars less than 130 and 2 hour after meal sugars less than 180. Please keep a log of your results when checking your blood sugar   Our goal bad cholesterol, or LDL, is less than 70 . This is why it is important to continue taking your rosuvastatin.  Check your blood pressure twice weekly, and any time you have concerning symptoms like headache, chest pain, dizziness, shortness of breath, or vision changes.   Our goal is less than 130/80.  To appropriately check your blood pressure, make sure you do the following:  1) Avoid caffeine, exercise, or tobacco products for 30 minutes before checking. Empty your bladder. 2) Sit with your back supported in a flat-backed chair. Rest your arm on something flat (arm of the chair, table, etc). 3) Sit still with your feet flat on the floor, resting, for at least 5 minutes.  4) Check your blood pressure. Take 1-2 readings.  5) Write down these readings and bring with you to any provider appointments.  Bring your home blood pressure machine with you to a provider's office for accuracy comparison at least once a year.   Make sure you take your blood pressure medications before you come to any office visit, even if you were asked to fast for labs.  [name], PharmD, [place] [phone]          Our next appointment is by telephone on 05/05/2022 at 9:15 am  Please call the care guide team at [phone] if you need to cancel or reschedule your appointment.   If you are experiencing a Mental Health or  [place] or need someone to talk to, please call the [place] National Suicide Prevention Lifeline: (606) 831-6444 or TTY: 819-622-0497 TTY 737-031-1002) to talk to a trained counselor call 1-800-273-TALK (toll free, 24 hour hotline)   Patient verbalizes understanding of instructions and care plan provided today and agrees to view in [place]. Active MyChart status and patient understanding of how to access instructions and care plan via MyChart confirmed with patient.

## 2022-04-25 DIAGNOSIS — I214 Non-ST elevation (NSTEMI) myocardial infarction: Secondary | ICD-10-CM | POA: Diagnosis not present

## 2022-04-25 DIAGNOSIS — E785 Hyperlipidemia, unspecified: Secondary | ICD-10-CM | POA: Diagnosis not present

## 2022-04-25 DIAGNOSIS — E119 Type 2 diabetes mellitus without complications: Secondary | ICD-10-CM | POA: Diagnosis not present

## 2022-04-25 DIAGNOSIS — I10 Essential (primary) hypertension: Secondary | ICD-10-CM | POA: Diagnosis not present

## 2022-04-25 DIAGNOSIS — I2510 Atherosclerotic heart disease of native coronary artery without angina pectoris: Secondary | ICD-10-CM | POA: Diagnosis not present

## 2022-04-25 DIAGNOSIS — E669 Obesity, unspecified: Secondary | ICD-10-CM | POA: Diagnosis not present

## 2022-05-05 ENCOUNTER — Ambulatory Visit: Payer: Medicare HMO | Admitting: Pharmacist

## 2022-05-05 DIAGNOSIS — I10 Essential (primary) hypertension: Secondary | ICD-10-CM

## 2022-05-05 DIAGNOSIS — E119 Type 2 diabetes mellitus without complications: Secondary | ICD-10-CM

## 2022-05-05 NOTE — Chronic Care Management (AMB) (Signed)
Chronic Care Management CCM Pharmacy Note  05/05/2022 Name:  Daniel Bradley. MRN:  [id number] DOB:  [birth year]   Subjective: Daniel Bradley. is an 67 y.o. year old male who is a primary patient of [name], NP.  The CCM team was consulted for assistance with disease management and care coordination needs.    Engaged with patient by telephone for follow up visit for pharmacy case management and/or care coordination services.   Objective:  Medications Reviewed Today     Reviewed by Rennis Petty, RPH-CPP (Pharmacist) on 04/21/22 at 1039  Med List Status: <None>   Medication Order Taking? Sig Documenting Provider Last Dose Status Informant  Accu-Chek Softclix Lancets lancets [id number]  Use to check Blood sugar once a day.  DX: E11.9 [name], NP  Active   Alcohol Swabs (B-D SINGLE USE SWABS REGULAR) PADS [id number]  Use to check blood sugar once a day.  DX: E11.9 [name], NP  Active   amLODipine (NORVASC) 10 MG tablet [id number] Yes Take 1 tablet (10 mg total) by mouth daily. [name], NP Taking Active   aspirin EC 81 MG tablet [id number]  81 mg daily. Swallow whole. [provider]  Active   blood glucose meter kit and supplies [id number]  Dispense based on patient and insurance preference. Use to check blood sugar daily as directed. DX Ell.9 [name], NP  Active   Cholecalciferol 125 MCG (5000 UT) capsule [id number]  Take 5,000 Units by mouth daily. [provider]  Active   doxylamine, Sleep, (UNISOM) 25 MG tablet [id number]  Take 25 mg by mouth at bedtime as needed. [provider]  Active   glipiZIDE (GLUCOTROL) 10 MG tablet [id number] Yes Take 1 tablet (10 mg total) by mouth 2 (two) times daily before a meal. Baity, Coralie Keens, NP Taking Active   glucose blood (ACCU-CHEK GUIDE) test strip [id number]  Use to check blood sugar once a day.  DX: E11.9 [name], NP  Active   metFORMIN (GLUCOPHAGE) 850 MG tablet  [id number] Yes Take 1 tablet (850 mg total) by mouth 2 (two) times daily with a meal. [name], NP Taking Active   metoprolol tartrate (LOPRESSOR) 25 MG tablet [id number] Yes TAKE 1 TABLET(25 MG) BY MOUTH TWICE DAILY Baity, Coralie Keens, NP Taking Active   rosuvastatin (CRESTOR) 20 MG tablet [id number]  Take 1 tablet (20 mg total) by mouth daily. [name], NP  Active   Semaglutide (RYBELSUS) 3 MG TABS [id number] Yes Take 3 mg by mouth daily. [provider] Taking Active             Pertinent Labs:  Lab Results  Component Value Date   HGBA1C 8.7 (A) 03/27/2022   Lab Results  Component Value Date   CHOL 150 01/07/2022   HDL 49 01/07/2022   LDLCALC 83 01/07/2022   TRIG 86 01/07/2022   CHOLHDL 3.1 01/07/2022   Lab Results  Component Value Date   CREATININE 1.00 01/07/2022   BUN 19 01/07/2022   NA 141 01/07/2022   K 4.5 01/07/2022   CL 104 01/07/2022   CO2 27 01/07/2022   BP Readings from Last 3 Encounters:  03/27/22 118/70  01/07/22 (!) 146/80  12/09/21 134/76   Pulse Readings from Last 3 Encounters:  03/27/22 77  01/07/22 89  12/09/21 91     SDOH:  (Social Determinants of Health) assessments and interventions performed:  SDOH Interventions  [place] Office Visit from 03/27/2022 in [facility] Office Visit from 01/07/2022 in [facility] Chronic Care Management from 01/01/2022 in [facility] Office Visit from 12/09/2021 in [facility] Clinical Support from 08/29/2021 in [facility] Office Visit from 03/19/2021 in [facility]  SDOH Interventions        Food Insecurity Interventions -- -- Intervention Not Indicated -- Intervention Not Indicated --  Housing Interventions -- -- Intervention Not Indicated -- Intervention Not Indicated --  Transportation Interventions -- -- Intervention Not  Indicated -- Intervention Not Indicated --  Utilities Interventions -- -- Intervention Not Indicated -- -- --  Alcohol Usage Interventions -- -- Intervention Not Indicated (Score <7) -- -- --  Depression Interventions/Treatment  Patient refuses Treatment Patient refuses Treatment -- Patient refuses Treatment PHQ2-9 Score <4 Follow-up Not Indicated Medication  Financial Strain Interventions -- -- Other (Comment)  [needs assistance with paying for [name] consult pending] -- Intervention Not Indicated --  Physical Activity Interventions -- -- Intervention Not Indicated, Other (Comments)  [goes to the gym, 3 or 4 days a week, does walking, bench presses, weights, and knee presses] -- Intervention Not Indicated --  Stress Interventions -- -- Intervention Not Indicated -- Intervention Not Indicated --  Social Connections Interventions -- -- Intervention Not Indicated, Other (Comment)  [lives with his girlfriend April, the patient has been divorced 3 times, good support system, has 2 sons] -- Intervention Not Indicated --       CCM Care Plan  Review of patient past medical history, allergies, medications, health status, including review of consultants reports, laboratory and other test data, was performed as part of comprehensive evaluation and provision of chronic care management services.   Care Plan : General Pharmacy (Adult)  Updates made by Rennis Petty, RPH-CPP since 05/05/2022 12:00 AM     Problem: Disease Progression      Long-Range Goal: Disease Progression Prevented or Minimized   Start Date: 01/10/2022  Expected End Date: 04/10/2022  Recent Progress: On track  Priority: High  Note:   Current Barriers:  Unable to independently afford treatment regimen Patient approved for patient assistance for Rybelsus from [place] through 02/10/2023 Unable to achieve control of A1C Unable to achieve control of blood pressure   Pharmacist Clinical Goal(s):  patient will  verbalize ability to afford treatment regimen achieve control of T2DM as evidenced by A1C  through collaboration with PharmD and provider achieve control of HTN as evidenced by blood pressure through collaboration with PharmD and provider.    Interventions: 1:1 collaboration with [name], NP regarding development and update of comprehensive plan of care as evidenced by provider attestation and co-signature Inter-disciplinary care team collaboration (see longitudinal plan of care) Reports takes his medications directly from pill bottles and has a routine to aid with adherence; denies missed doses Reports started using weekly pillbox as adherence aid  Diabetes:  Uncontrolled based on latest A1C; current treatment: Glipizide 10 mg twice daily before breakfast and supper (30 minutes prior to meal) Metformin 850 mg twice daily Rybelsus 7 mg daily (increased to his dose ~1 week ago) Confirms taking on an empty stomach, ?30 minutes before the first food, beverage, or other oral medications of the day with ?4 oz of plain water only Medications tried in the past: Rybelsus (cost); Ozempic (cost); [name] (cost) Recent fasting blood  sugar readings:  Fasting Blood Sugar  18 - March 148  19 - March 91  20 - March 102  21 - March 128  22 - March 128  23 - March 127  24 - March -  25 - March -  Denies hypoglycemic symptoms Current exercise: reports restarted going to gym ~80-90 mins walks, chest press, knee press x 3-4 days/week Statin: rosuvastatin 20 mg daily Have reviewed long term cardiovascular and renal outcomes of uncontrolled blood sugar Have reviewed goal A1c, goal fasting, and goal 2 hour post prandial glucose Have encouraged dietary modifications including: Encouraged patient to have regular well-balance meals throughout the day (including breakfast), while controlling carbohydrate portion sizes Encouraged patient to review nutrition labels for carbohydrate content of  foods Recommend to continue to check glucose, keep a log of the results, have this record to review during future appointments, but to contact office sooner if needed for readings outside of established parameters or symptoms Review counseling on s/s of low blood sugar and how to treat lows Reports picked up glucose tablets and now carrying these with him in case needed for hypoglycemia Collaborated with [facility] CPhT Susy Frizzle and PCP to assist patient with applying for patient assistance for Rybelsus from [employer] currently has 4 bottles (3 unopened) of Rybelsus 7 mg strength (30 count each) from office as sent by assistance program  Hypertension:  Goal on Track (progressing): YES. Uncontrolled based on latest in office reading; current treatment: amlodipine 10 mg daily metoprolol 25 mg twice daily Reports received new upper arm blood pressure monitor from [facility] over the counter benefit Recent home monitoring results:  Blood Pressure   18 - March 138/76, HR 78   19 - March 133/77, HR 77   20 - March 133/67, HR 90   21 - March 138/75, HR 81   22 - March 130/74, HR 78   23 - March 148/81, HR 81 *Attributes higher reading to something salty in diet  24 - March -   25 - March -   Denies hypotensive symptoms such as dizziness or lightheadedness Note patient currently checking blood pressure close to time of morning medication administration Current exercise: reports restarted going to gym ~80-90 mins walks, chest press, knee press x 3-4 days/week Reviewed long term cardiovascular and renal outcomes of uncontrolled blood pressure Reviewed appropriate blood pressure monitoring technique and reviewed goal blood pressure Encourage patient to limit salt/sodium intake Recommended to check home blood pressure and heart rate using upper arm monitor, keep a log of the results and have this record to review during future appointments   Patient Goals/Self-Care Activities patient will:  -  take medications as prescribed as evidenced by patient report and record review - check glucose , document, and provide at future appointments - check blood pressure, document, and provide at future appointments - collaborate with provider on medication access solutions       Plan: Telephone follow up appointment with care management team member scheduled for:  06/02/2022 at 9:15 am  [name], PharmD, [facility] [phone]

## 2022-05-05 NOTE — Patient Instructions (Signed)
Visit Information  Thank you for taking time to visit with me today. Please don't hesitate to contact me if I can be of assistance to you before our next scheduled telephone appointment.  Following are the goals we discussed today:   Goals Addressed             This Visit's Progress    Pharmacy Goals       Our goal A1c is less than 7%. This corresponds with fasting sugars less than 130 and 2 hour after meal sugars less than 180. Please keep a log of your results when checking your blood sugar   Our goal bad cholesterol, or LDL, is less than 70 . This is why it is important to continue taking your rosuvastatin.  Check your blood pressure twice weekly, and any time you have concerning symptoms like headache, chest pain, dizziness, shortness of breath, or vision changes.   Our goal is less than 130/80.  To appropriately check your blood pressure, make sure you do the following:  1) Avoid caffeine, exercise, or tobacco products for 30 minutes before checking. Empty your bladder. 2) Sit with your back supported in a flat-backed chair. Rest your arm on something flat (arm of the chair, table, etc). 3) Sit still with your feet flat on the floor, resting, for at least 5 minutes.  4) Check your blood pressure. Take 1-2 readings.  5) Write down these readings and bring with you to any provider appointments.  Bring your home blood pressure machine with you to a provider's office for accuracy comparison at least once a year.   Make sure you take your blood pressure medications before you come to any office visit, even if you were asked to fast for labs.   [name], PharmD, [place] [phone]          Our next appointment is by telephone on 06/02/2022 at 9:15 am  Please call the care guide team at [phone] if you need to cancel or reschedule your appointment.    Patient verbalizes understanding of instructions  and care plan provided today and agrees to view in [place]. Active MyChart status and patient understanding of how to access instructions and care plan via MyChart confirmed with patient.

## 2022-05-11 DIAGNOSIS — I10 Essential (primary) hypertension: Secondary | ICD-10-CM

## 2022-05-11 DIAGNOSIS — E119 Type 2 diabetes mellitus without complications: Secondary | ICD-10-CM

## 2022-05-11 DIAGNOSIS — E7849 Other hyperlipidemia: Secondary | ICD-10-CM

## 2022-05-13 DIAGNOSIS — R0789 Other chest pain: Secondary | ICD-10-CM | POA: Diagnosis not present

## 2022-05-13 DIAGNOSIS — I2510 Atherosclerotic heart disease of native coronary artery without angina pectoris: Secondary | ICD-10-CM | POA: Diagnosis not present

## 2022-05-13 DIAGNOSIS — I2089 Other forms of angina pectoris: Secondary | ICD-10-CM | POA: Diagnosis not present

## 2022-06-02 ENCOUNTER — Ambulatory Visit (INDEPENDENT_AMBULATORY_CARE_PROVIDER_SITE_OTHER): Payer: Medicare HMO | Admitting: Pharmacist

## 2022-06-02 DIAGNOSIS — E7849 Other hyperlipidemia: Secondary | ICD-10-CM

## 2022-06-02 DIAGNOSIS — E119 Type 2 diabetes mellitus without complications: Secondary | ICD-10-CM

## 2022-06-02 DIAGNOSIS — I10 Essential (primary) hypertension: Secondary | ICD-10-CM

## 2022-06-02 NOTE — Patient Instructions (Signed)
Visit Information  Thank you for taking time to visit with me today. Please don't hesitate to contact me if I can be of assistance to you before our next scheduled telephone appointment.  Following are the goals we discussed today:   Goals Addressed             This Visit's Progress    Pharmacy Goals       Our goal A1c is less than 7%. This corresponds with fasting sugars less than 130 and 2 hour after meal sugars less than 180. Please keep a log of your results when checking your blood sugar   Our goal bad cholesterol, or LDL, is less than 70 . This is why it is important to continue taking your rosuvastatin.  Check your blood pressure twice weekly, and any time you have concerning symptoms like headache, chest pain, dizziness, shortness of breath, or vision changes.   Our goal is less than 130/80.  To appropriately check your blood pressure, make sure you do the following:  1) Avoid caffeine, exercise, or tobacco products for 30 minutes before checking. Empty your bladder. 2) Sit with your back supported in a flat-backed chair. Rest your arm on something flat (arm of the chair, table, etc). 3) Sit still with your feet flat on the floor, resting, for at least 5 minutes.  4) Check your blood pressure. Take 1-2 readings.  5) Write down these readings and bring with you to any provider appointments.  Bring your home blood pressure machine with you to a provider's office for accuracy comparison at least once a year.   Make sure you take your blood pressure medications before you come to any office visit, even if you were asked to fast for labs.  Estelle Grumbles, PharmD, BCACP Clinical Pharmacist [facility] [phone]          Our next appointment is by telephone on 07/11/2022 at 10:45 am  Please call the care guide team at [phone] if you need to cancel or reschedule your appointment.    Patient verbalizes understanding of instructions  and care plan provided today and agrees to view in MyChart. Active MyChart status and patient understanding of how to access instructions and care plan via MyChart confirmed with patient.

## 2022-06-02 NOTE — Chronic Care Management (AMB) (Signed)
Chronic Care Management CCM Pharmacy Note  06/02/2022 Name:  Daniel Bradley. MRN:  [id number] DOB:  1955/06/06   Subjective: Daniel Morozov. is an 67 y.o. year old male who is a primary patient of [name], NP.  The CCM team was consulted for assistance with disease management and care coordination needs.    Engaged with patient by telephone for follow up visit for pharmacy case management and/or care coordination services.   Objective:  Medications Reviewed Today     Reviewed by [name], RPH-CPP (Pharmacist) on 06/02/22 at [id number]  Med List Status: <None>   Medication Order Taking? Sig Documenting Provider Last Dose Status Informant  Accu-Chek Softclix Lancets lancets [id number]  Use to check Blood sugar once a day.  DX: E11.9 [name], NP  Active   Alcohol Swabs (B-D SINGLE USE SWABS REGULAR) PADS [id number]  Use to check blood sugar once a day.  DX: E11.9 [name], NP  Active   amLODipine (NORVASC) 10 MG tablet [id number] Yes Take 1 tablet (10 mg total) by mouth daily. [name], NP Taking Active   aspirin EC 81 MG tablet [id number]  81 mg daily. Swallow whole. [provider]  Active   blood glucose meter kit and supplies [id number]  Dispense based on patient and insurance preference. Use to check blood sugar daily as directed. DX Ell.9 [name], NP  Active   Cholecalciferol 125 MCG (5000 UT) capsule [id number]  Take 5,000 Units by mouth daily. [provider]  Active   doxylamine, Sleep, (UNISOM) 25 MG tablet [id number]  Take 25 mg by mouth at bedtime as needed. [provider]  Active   glipiZIDE (GLUCOTROL) 10 MG tablet [id number] Yes Take 1 tablet (10 mg total) by mouth 2 (two) times daily before a meal. Baity, [name], NP Taking Active   glucose blood (ACCU-CHEK GUIDE) test strip [id number]  Use to check blood sugar once a day.  DX: E11.9 [name], NP  Active   metFORMIN (GLUCOPHAGE) 850 MG tablet  [id number] Yes Take 1 tablet (850 mg total) by mouth 2 (two) times daily with a meal. [name], NP Taking Active   metoprolol tartrate (LOPRESSOR) 25 MG tablet [id number] Yes TAKE 1 TABLET(25 MG) BY MOUTH TWICE DAILY Baity, [name], NP Taking Active   rosuvastatin (CRESTOR) 20 MG tablet [id number] Yes Take 1 tablet (20 mg total) by mouth daily. [name], NP Taking Active   Semaglutide 7 MG TABS [id number] Yes Take 7 mg by mouth daily. [provider] Taking Active             Pertinent Labs:  Lab Results  Component Value Date   HGBA1C 8.7 (A) 03/27/2022   Lab Results  Component Value Date   CHOL 150 01/07/2022   HDL 49 01/07/2022   LDLCALC 83 01/07/2022   TRIG 86 01/07/2022   CHOLHDL 3.1 01/07/2022   Lab Results  Component Value Date   CREATININE 1.00 01/07/2022   BUN 19 01/07/2022   NA 141 01/07/2022   K 4.5 01/07/2022   CL 104 01/07/2022   CO2 27 01/07/2022   BP Readings from Last 3 Encounters:  03/27/22 118/70  01/07/22 (!) 146/80  12/09/21 134/76   Pulse Readings from Last 3 Encounters:  03/27/22 77  01/07/22 89  12/09/21 91     SDOH:  (Social Determinants of Health) assessments and interventions performed:  SDOH Interventions    Flowsheet  Row Office Visit from 03/27/2022 in [facility] Office Visit from 01/07/2022 in [facility] Chronic Care Management from 01/01/2022 in [facility] Office Visit from 12/09/2021 in [facility] Clinical Support from 08/29/2021 in [facility] Office Visit from 03/19/2021 in [facility]  SDOH Interventions        Food Insecurity Interventions -- -- Intervention Not Indicated -- Intervention Not Indicated --  Housing Interventions -- -- Intervention Not Indicated -- Intervention Not Indicated --  Transportation Interventions -- -- Intervention Not  Indicated -- Intervention Not Indicated --  Utilities Interventions -- -- Intervention Not Indicated -- -- --  Alcohol Usage Interventions -- -- Intervention Not Indicated (Score <7) -- -- --  Depression Interventions/Treatment  Patient refuses Treatment Patient refuses Treatment -- Patient refuses Treatment PHQ2-9 Score <4 Follow-up Not Indicated Medication  Financial Strain Interventions -- -- Other (Comment)  [needs assistance with paying for [employer] D consult pending] -- Intervention Not Indicated --  Physical Activity Interventions -- -- Intervention Not Indicated, Other (Comments)  [goes to the gym, 3 or 4 days a week, does walking, bench presses, weights, and knee presses] -- Intervention Not Indicated --  Stress Interventions -- -- Intervention Not Indicated -- Intervention Not Indicated --  Social Connections Interventions -- -- Intervention Not Indicated, Other (Comment)  [lives with his girlfriend April, the patient has been divorced 3 times, good support system, has 2 sons] -- Intervention Not Indicated --       CCM Care Plan  Review of patient past medical history, allergies, medications, health status, including review of consultants reports, laboratory and other test data, was performed as part of comprehensive evaluation and provision of chronic care management services.   Care Plan : General Pharmacy (Adult)  Updates made by [name], RPH-CPP since 06/02/2022 12:00 AM     Problem: Disease Progression      Long-Range Goal: Disease Progression Prevented or Minimized   Start Date: 01/10/2022  Expected End Date: 04/10/2022  Recent Progress: On track  Priority: High  Note:   Current Barriers:  Unable to independently afford treatment regimen Patient approved for patient assistance for Rybelsus from [employer] through 02/10/2023 Unable to achieve control of A1C Unable to achieve control of blood pressure   Pharmacist Clinical Goal(s):  patient will  verbalize ability to afford treatment regimen achieve control of T2DM as evidenced by A1C  through collaboration with PharmD and provider achieve control of HTN as evidenced by blood pressure through collaboration with PharmD and provider.    Interventions: 1:1 collaboration with [name], NP regarding development and update of comprehensive plan of care as evidenced by provider attestation and co-signature Inter-disciplinary care team collaboration (see longitudinal plan of care) Reports recently had back problems at the end of March/beginning of April, but now feeling better for the past week Reports takes his medications directly from pill bottles and has a routine to aid with adherence; denies missed doses Reports stopped using weekly pillbox as adherence aid and went back to taking from bottles because feels like his previous strategy works better for him as has a routine  Diabetes:  Uncontrolled based on latest A1C; current treatment: Glipizide 10 mg twice daily before breakfast and supper (30 minutes prior to meal) Metformin 850 mg twice daily Rybelsus 7 mg daily  Confirms taking on an empty stomach, ?30  minutes before the first food, beverage, or other oral medications of the day with ?4 oz of plain water only Medications tried in the past: Rybelsus (cost); Ozempic (cost); Evaristo Bury (cost) Recent fasting blood sugar readings ranging 115-128 Denies hypoglycemic symptoms Current exercise: reports restarted going to gym last week (since back feeling better ~30-45 mins walks, chest press, knee press x 3-4 days/week Statin: rosuvastatin 20 mg daily Have reviewed long term cardiovascular and renal outcomes of uncontrolled blood sugar Have reviewed goal A1c, goal fasting, and goal 2 hour post prandial glucose Have encouraged dietary modifications including: Encouraged patient to have regular well-balance meals throughout the day (including breakfast), while controlling carbohydrate  portion sizes Encouraged patient to review nutrition labels for carbohydrate content of foods Recommend to continue to check glucose, keep a log of the results, have this record to review during future appointments, but to contact office sooner if needed for readings outside of established parameters or symptoms Review counseling on s/s of low blood sugar and how to treat lows Patient carries glucose tablets with him in case needed for hypoglycemia Collaborated with [facility] Noreene Larsson [place] and PCP to assist patient with applying for patient assistance for Rybelsus from [employer] currently has 3 bottles (2 unopened) of Rybelsus 7 mg strength (30 count each) from office as sent by assistance program  Hypertension:  Goal on Track (progressing): YES. Uncontrolled based on latest in office reading; current treatment: amlodipine 10 mg daily metoprolol 25 mg twice daily Patient has upper arm blood pressure monitor from [facility] over the counter benefit Recent home monitoring results: 4/21 - 119/78, HR 74 4/12 - 122/76, HR 82 4/9 - 134/79, HR 78 Denies hypotensive symptoms such as dizziness or lightheadedness Current exercise: reports restarted going to gym ~80-90 mins walks, chest press, knee press x 3-4 days/week Reviewed long term cardiovascular and renal outcomes of uncontrolled blood pressure Reviewed appropriate blood pressure monitoring technique and reviewed goal blood pressure Encourage patient to limit salt/sodium intake Recommended to check home blood pressure and heart rate using upper arm monitor, keep a log of the results and have this record to review during future appointments   Patient Goals/Self-Care Activities patient will:  - take medications as prescribed as evidenced by patient report and record review - check glucose , document, and provide at future appointments - check blood pressure, document, and provide at future appointments - collaborate with provider on  medication access solutions       Plan: Telephone follow up appointment with care management team member scheduled for:  07/11/2022 at 10:45 am  Estelle Grumbles, PharmD, [facility] Clinical Pharmacist [facility] [phone]

## 2022-06-10 DIAGNOSIS — E785 Hyperlipidemia, unspecified: Secondary | ICD-10-CM | POA: Diagnosis not present

## 2022-06-10 DIAGNOSIS — Z7984 Long term (current) use of oral hypoglycemic drugs: Secondary | ICD-10-CM | POA: Diagnosis not present

## 2022-06-10 DIAGNOSIS — I10 Essential (primary) hypertension: Secondary | ICD-10-CM

## 2022-06-10 DIAGNOSIS — E1159 Type 2 diabetes mellitus with other circulatory complications: Secondary | ICD-10-CM | POA: Diagnosis not present

## 2022-06-13 ENCOUNTER — Telehealth: Payer: Medicare HMO

## 2022-06-13 ENCOUNTER — Ambulatory Visit (INDEPENDENT_AMBULATORY_CARE_PROVIDER_SITE_OTHER): Payer: Medicare HMO

## 2022-06-13 DIAGNOSIS — E119 Type 2 diabetes mellitus without complications: Secondary | ICD-10-CM

## 2022-06-13 DIAGNOSIS — E7849 Other hyperlipidemia: Secondary | ICD-10-CM

## 2022-06-13 NOTE — Patient Instructions (Signed)
Please call the care guide team at [phone] if you need to cancel or reschedule your appointment.   If you are experiencing a Mental Health or Behavioral Health Crisis or need someone to talk to, please call the Suicide and Crisis Lifeline: 988 call the [place] National Suicide Prevention Lifeline: (719) 378-5274 or TTY: (989) 529-1945 TTY (909) 302-3019) to talk to a trained counselor call 1-800-273-TALK (toll free, 24 hour hotline)   Following is a copy of the CCM Program Consent:  CCM service includes personalized support from designated clinical staff supervised by the physician, including individualized plan of care and coordination with other care providers 24/7 contact phone numbers for assistance for urgent and routine care needs. Service will only be billed when office clinical staff spend 20 minutes or more in a month to coordinate care. Only one practitioner may furnish and bill the service in a calendar month. The patient may stop CCM services at amy time (effective at the end of the month) by phone call to the office staff. The patient will be responsible for cost sharing (co-pay) or up to 20% of the service fee (after annual deductible is met)  Following is a copy of your full provider care plan:   Goals Addressed             This Visit's Progress    CCM Expected Outcome:  Monitor, Self-Manage and Reduce Symptoms of Diabetes       Current Barriers:  Knowledge Deficits related to the significance of controlling blood sugars and keeping A1C levels down Care Coordination needs related to medication cost constraints  in a patient with DM Chronic Disease Management support and education needs related to effective management of DM Financial Constraints.  Lab Results  Component Value Date   HGBA1C 8.7 (A) 03/27/2022     Planned Interventions: Provided education to patient about basic DM disease process. Review and education provided. The patient with elevated A1C. He knows he  needs to get it down or it could impact his health in a negative way. Education on medication adherence and monitoring dietary intake.; Reviewed medications with patient and discussed importance of medication adherence. The patient is compliant with medications. The patient has been approved for Rybelsus and is thankful for this. This is a medication that helped him a lot in the past. Review of the notes and the patient will be able to pick up the medications at the office when they are in. The patient continues to work with pharm D on a regular basis. Education and support given. ;        Reviewed prescribed diet with patient heart healthy/ADA diet.  He admits sweets are his weak points. Education provided on eating balanced meals and monitoring for abnormal readings. The patient is being more mindful of his eating habits. States he is eating better and he is watching what he eats.  Counseled on importance of regular laboratory monitoring as prescribed. Has regular lab work done;        Discussed plans with patient for ongoing care management follow up and provided patient with direct contact information for care management team;      Provided patient with written educational materials related to hypo and hyperglycemia and importance of correct treatment. The patient states the highest his blood sugars has been is 152 and the lowest it has been is 109. The patient denies any sx and sx of hypo or hyperglycemia. Review and education provided. States he does notice his blood sugar level dropping  after he has exercised. Education provided.        Reviewed scheduled/upcoming provider appointments including:  06-25-2022 at 10 am Advised patient, providing education and rationale, to check cbg twice daily and when you have symptoms of low or high blood sugar and record. The patient is taking his blood sugars. Gave several readings today. See chart below. Review of the goal of fasting is <130 and post prandial of  <180.    Date Am 2 hours after eating  06-08-2022 152 144  06-09-2022 132 120  06-10-2022 121 120  06-11-2022 118 130  06-12-2022 120 125  06-13-2022 109 112   Call provider for findings outside established parameters;       Referral made to pharmacy team for assistance with assistance with cost constraints related to Rybelsus and inability to pay for refills. The patient is working with the pharm D for medication cost constraints. The patient has been approved for Rybelsus and will be covered through 2024. Continues to work with the pharm D on a regular basis Review of patient status, including review of consultants reports, relevant laboratory and other test results, and medications completed;       Advised patient to discuss changes in DM health and well being with provider;      Screening for signs and symptoms of depression related to chronic disease state;        Assessed social determinant of health barriers;         Symptom Management: Take medications as prescribed   Attend all scheduled provider appointments Call provider office for new concerns or questions  call the Suicide and Crisis Lifeline: 988 call the [place] National Suicide Prevention Lifeline: 406-175-6698 or TTY: 254-614-9489 TTY (925) 246-0005) to talk to a trained counselor call 1-800-273-TALK (toll free, 24 hour hotline) if experiencing a Mental Health or Behavioral Health Crisis  check feet daily for cuts, sores or redness trim toenails straight across manage portion size wash and dry feet carefully every day wear comfortable, cotton socks wear comfortable, well-fitting shoes  Follow Up Plan: Telephone follow up appointment with care management team member scheduled for: 08-15-2022 at 145 pm       CCM Expected Outcome:  Monitor, Self-Manage and Reduce Symptoms of: HLD       Current Barriers:  Chronic Disease Management support and education needs related to effective management of HLD BP Readings from Last 3  Encounters:  03/27/22 118/70  01/07/22 (!) 146/80  12/09/21 134/76    Lab Results  Component Value Date   CHOL 150 01/07/2022   CHOL 229 (H) 10/15/2021   CHOL 198 06/20/2021   Lab Results  Component Value Date   HDL 49 01/07/2022   HDL 49 10/15/2021   HDL 51 06/20/2021   Lab Results  Component Value Date   LDLCALC 83 01/07/2022   LDLCALC 152 (H) 10/15/2021   LDLCALC 118 (H) 06/20/2021   Lab Results  Component Value Date   TRIG 86 01/07/2022   TRIG 153 (H) 10/15/2021   TRIG 177 (H) 06/20/2021   Lab Results  Component Value Date   CHOLHDL 3.1 01/07/2022   CHOLHDL 4.7 10/15/2021   CHOLHDL 3.9 06/20/2021    Planned Interventions: Provider established cholesterol goals reviewed.Review of goals. Review of goals and the patient is at goal currently. Review of making sure he is taking his medications and following the plan of care established by the provider.  ; Counseled on importance of regular laboratory monitoring as prescribed. Has lab  work done on a regular basis. Sees the pcp on 06-25-2022; Provided HLD educational materials. Education and support given; Reviewed role and benefits of statin for ASCVD risk reduction; Self reported Blood pressure readings with pulse rate:  Date AM PM  06-08-2022 115/76-74 128/78-74  06-09-2022 121/69-84 126/78-77  06-10-2022 132/79-76 131/77-76  06-11-2022 131/76-78 127/73-76  06-12-2022 135/80-76 130/75-77  06-13-2022 120/77-76 145/75-77   Discussed strategies to manage statin-induced myalgias; Reviewed importance of limiting foods high in cholesterol. Review of heart healthy/ADA diet-He is trying to be more mindful of foods that are not good for him. Sweets is his weakness, is doing better with managing his dietary restrictions. Will continue to monitor for changes; Reviewed exercise goals and target of 150 minutes per week. Patient goes to the gym and works out on a regular basis. Usually works out one hour and 15 minutes. He states he feels  a lot better since going to the gym regularly. Education and support given; Screening for signs and symptoms of depression related to chronic disease state;  Assessed social determinant of health barriers;   Symptom Management: Take medications as prescribed   Attend all scheduled provider appointments Call provider office for new concerns or questions  call the Suicide and Crisis Lifeline: 988 call the [place] National Suicide Prevention Lifeline: (985)244-3843 or TTY: 810-552-1714 TTY (912)565-2770) to talk to a trained counselor call 1-800-273-TALK (toll free, 24 hour hotline) if experiencing a Mental Health or Behavioral Health Crisis  - call for medicine refill 2 or 3 days before it runs out - take all medications exactly as prescribed - call doctor with any symptoms you believe are related to your medicine - call doctor when you experience any new symptoms - go to all doctor appointments as scheduled - adhere to prescribed diet: heart healthy/ADA diet   Follow Up Plan: Telephone follow up appointment with care management team member scheduled for: 08-15-2022 at 145 pm          Patient verbalizes understanding of instructions and care plan provided today and agrees to view in MyChart. Active MyChart status and patient understanding of how to access instructions and care plan via MyChart confirmed with patient.  Telephone follow up appointment with care management team member scheduled for: 08-15-2022 at 145 pm,

## 2022-06-13 NOTE — Chronic Care Management (AMB) (Signed)
Chronic Care Management   CCM RN Visit Note  06/13/2022 Name: Daniel Bradley. MRN: [id number] DOB: 1955-11-21  Subjective: Daniel Bradley. is a 67 y.o. year old male who is a primary care patient of [name], NP. The patient was referred to the Chronic Care Management team for assistance with care management needs subsequent to provider initiation of CCM services and plan of care.    Today's Visit:  Engaged with patient by telephone for follow up visit.        Goals Addressed             This Visit's Progress    CCM Expected Outcome:  Monitor, Self-Manage and Reduce Symptoms of Diabetes       Current Barriers:  Knowledge Deficits related to the significance of controlling blood sugars and keeping A1C levels down Care Coordination needs related to medication cost constraints  in a patient with DM Chronic Disease Management support and education needs related to effective management of DM Financial Constraints.  Lab Results  Component Value Date   HGBA1C 8.7 (A) 03/27/2022     Planned Interventions: Provided education to patient about basic DM disease process. Review and education provided. The patient with elevated A1C. He knows he needs to get it down or it could impact his health in a negative way. Education on medication adherence and monitoring dietary intake.; Reviewed medications with patient and discussed importance of medication adherence. The patient is compliant with medications. The patient has been approved for Rybelsus and is thankful for this. This is a medication that helped him a lot in the past. Review of the notes and the patient will be able to pick up the medications at the office when they are in. The patient continues to work with pharm D on a regular basis. Education and support given. ;        Reviewed prescribed diet with patient heart healthy/ADA diet.  He admits sweets are his weak points. Education provided on eating balanced meals and  monitoring for abnormal readings. The patient is being more mindful of his eating habits. States he is eating better and he is watching what he eats.  Counseled on importance of regular laboratory monitoring as prescribed. Has regular lab work done;        Discussed plans with patient for ongoing care management follow up and provided patient with direct contact information for care management team;      Provided patient with written educational materials related to hypo and hyperglycemia and importance of correct treatment. The patient states the highest his blood sugars has been is 152 and the lowest it has been is 109. The patient denies any sx and sx of hypo or hyperglycemia. Review and education provided. States he does notice his blood sugar level dropping after he has exercised. Education provided.        Reviewed scheduled/upcoming provider appointments including:  06-25-2022 at 10 am Advised patient, providing education and rationale, to check cbg twice daily and when you have symptoms of low or high blood sugar and record. The patient is taking his blood sugars. Gave several readings today. See chart below. Review of the goal of fasting is <130 and post prandial of <180.    Date Am 2 hours after eating  06-08-2022 152 144  06-09-2022 132 120  06-10-2022 121 120  06-11-2022 118 130  06-12-2022 120 125  06-13-2022 109 112   Call provider for findings outside established parameters;  Referral made to pharmacy team for assistance with assistance with cost constraints related to Rybelsus and inability to pay for refills. The patient is working with the pharm D for medication cost constraints. The patient has been approved for Rybelsus and will be covered through 2024. Continues to work with the pharm D on a regular basis Review of patient status, including review of consultants reports, relevant laboratory and other test results, and medications completed;       Advised patient to discuss changes in  DM health and well being with provider;      Screening for signs and symptoms of depression related to chronic disease state;        Assessed social determinant of health barriers;         Symptom Management: Take medications as prescribed   Attend all scheduled provider appointments Call provider office for new concerns or questions  call the Suicide and Crisis Lifeline: 988 call the [place] National Suicide Prevention Lifeline: 3608670725 or TTY: 3394874801 TTY 380-006-1888) to talk to a trained counselor call 1-800-273-TALK (toll free, 24 hour hotline) if experiencing a Mental Health or Behavioral Health Crisis  check feet daily for cuts, sores or redness trim toenails straight across manage portion size wash and dry feet carefully every day wear comfortable, cotton socks wear comfortable, well-fitting shoes  Follow Up Plan: Telephone follow up appointment with care management team member scheduled for: 08-15-2022 at 145 pm       CCM Expected Outcome:  Monitor, Self-Manage and Reduce Symptoms of: HLD       Current Barriers:  Chronic Disease Management support and education needs related to effective management of HLD BP Readings from Last 3 Encounters:  03/27/22 118/70  01/07/22 (!) 146/80  12/09/21 134/76    Lab Results  Component Value Date   CHOL 150 01/07/2022   CHOL 229 (H) 10/15/2021   CHOL 198 06/20/2021   Lab Results  Component Value Date   HDL 49 01/07/2022   HDL 49 10/15/2021   HDL 51 06/20/2021   Lab Results  Component Value Date   LDLCALC 83 01/07/2022   LDLCALC 152 (H) 10/15/2021   LDLCALC 118 (H) 06/20/2021   Lab Results  Component Value Date   TRIG 86 01/07/2022   TRIG 153 (H) 10/15/2021   TRIG 177 (H) 06/20/2021   Lab Results  Component Value Date   CHOLHDL 3.1 01/07/2022   CHOLHDL 4.7 10/15/2021   CHOLHDL 3.9 06/20/2021    Planned Interventions: Provider established cholesterol goals reviewed.Review of goals. Review of goals and the  patient is at goal currently. Review of making sure he is taking his medications and following the plan of care established by the provider.  ; Counseled on importance of regular laboratory monitoring as prescribed. Has lab work done on a regular basis. Sees the pcp on 06-25-2022; Provided HLD educational materials. Education and support given; Reviewed role and benefits of statin for ASCVD risk reduction; Self reported Blood pressure readings with pulse rate:  Date AM PM  06-08-2022 115/76-74 128/78-74  06-09-2022 121/69-84 126/78-77  06-10-2022 132/79-76 131/77-76  06-11-2022 131/76-78 127/73-76  06-12-2022 135/80-76 130/75-77  06-13-2022 120/77-76 145/75-77   Discussed strategies to manage statin-induced myalgias; Reviewed importance of limiting foods high in cholesterol. Review of heart healthy/ADA diet-He is trying to be more mindful of foods that are not good for him. Sweets is his weakness, is doing better with managing his dietary restrictions. Will continue to monitor for changes; Reviewed exercise goals and  target of 150 minutes per week. Patient goes to the gym and works out on a regular basis. Usually works out one hour and 15 minutes. He states he feels a lot better since going to the gym regularly. Education and support given; Screening for signs and symptoms of depression related to chronic disease state;  Assessed social determinant of health barriers;   Symptom Management: Take medications as prescribed   Attend all scheduled provider appointments Call provider office for new concerns or questions  call the Suicide and Crisis Lifeline: 988 call the [place] National Suicide Prevention Lifeline: 8045454615 or TTY: 681-184-7929 TTY 854 539 1877) to talk to a trained counselor call 1-800-273-TALK (toll free, 24 hour hotline) if experiencing a Mental Health or Behavioral Health Crisis  - call for medicine refill 2 or 3 days before it runs out - take all medications exactly as  prescribed - call doctor with any symptoms you believe are related to your medicine - call doctor when you experience any new symptoms - go to all doctor appointments as scheduled - adhere to prescribed diet: heart healthy/ADA diet   Follow Up Plan: Telephone follow up appointment with care management team member scheduled for: 08-15-2022 at 145 pm          Plan:Telephone follow up appointment with care management team member scheduled for:  08-15-2022 at 145 pm  Alto Denver RN, MSN, CCM RN Care Manager  Chronic Care Management Direct Number: [phone]

## 2022-06-25 ENCOUNTER — Ambulatory Visit: Payer: Medicare HMO | Admitting: Internal Medicine

## 2022-07-01 ENCOUNTER — Ambulatory Visit (INDEPENDENT_AMBULATORY_CARE_PROVIDER_SITE_OTHER): Payer: Medicare HMO | Admitting: Internal Medicine

## 2022-07-01 ENCOUNTER — Encounter: Payer: Self-pay | Admitting: Internal Medicine

## 2022-07-01 VITALS — BP 134/62 | HR 85 | Temp 97.1°F | Wt 273.0 lb

## 2022-07-01 DIAGNOSIS — I214 Non-ST elevation (NSTEMI) myocardial infarction: Secondary | ICD-10-CM

## 2022-07-01 DIAGNOSIS — N529 Male erectile dysfunction, unspecified: Secondary | ICD-10-CM

## 2022-07-01 DIAGNOSIS — I10 Essential (primary) hypertension: Secondary | ICD-10-CM

## 2022-07-01 DIAGNOSIS — M545 Low back pain, unspecified: Secondary | ICD-10-CM

## 2022-07-01 DIAGNOSIS — M1611 Unilateral primary osteoarthritis, right hip: Secondary | ICD-10-CM

## 2022-07-01 DIAGNOSIS — I25810 Atherosclerosis of coronary artery bypass graft(s) without angina pectoris: Secondary | ICD-10-CM | POA: Diagnosis not present

## 2022-07-01 DIAGNOSIS — G4733 Obstructive sleep apnea (adult) (pediatric): Secondary | ICD-10-CM | POA: Diagnosis not present

## 2022-07-01 DIAGNOSIS — E1169 Type 2 diabetes mellitus with other specified complication: Secondary | ICD-10-CM

## 2022-07-01 DIAGNOSIS — I700 Atherosclerosis of aorta: Secondary | ICD-10-CM

## 2022-07-01 DIAGNOSIS — E785 Hyperlipidemia, unspecified: Secondary | ICD-10-CM

## 2022-07-01 DIAGNOSIS — G8929 Other chronic pain: Secondary | ICD-10-CM

## 2022-07-01 DIAGNOSIS — F320 Major depressive disorder, single episode, mild: Secondary | ICD-10-CM

## 2022-07-01 DIAGNOSIS — Z6836 Body mass index (BMI) 36.0-36.9, adult: Secondary | ICD-10-CM

## 2022-07-01 LAB — CBC
HCT: 44.5 % (ref 38.5–50.0)
MCV: 79.3 fL — ABNORMAL LOW (ref 80.0–100.0)

## 2022-07-01 LAB — POCT GLYCOSYLATED HEMOGLOBIN (HGB A1C): HbA1c, POC (controlled diabetic range): 6.9 % (ref 0.0–7.0)

## 2022-07-01 MED ORDER — CITALOPRAM HYDROBROMIDE 10 MG PO TABS: 10.0000 mg | ORAL_TABLET | Freq: Every day | ORAL | 1 refills | Status: DC

## 2022-07-01 NOTE — Patient Instructions (Signed)

## 2022-07-01 NOTE — Assessment & Plan Note (Signed)
Encourage regular stretching and core strengthening Encourage weight loss as this can help reduce back pain Okay to take Tylenol arthritis OTC as needed

## 2022-07-01 NOTE — Assessment & Plan Note (Signed)
Will trial escitalopram 10 mg daily Support offered 

## 2022-07-01 NOTE — Assessment & Plan Note (Signed)
Not medicated He does not follow with urology

## 2022-07-01 NOTE — Assessment & Plan Note (Signed)
Controlled on amlodipine and metoprolol Reinforced DASH diet and exercise for weight loss 

## 2022-07-01 NOTE — Progress Notes (Signed)
Subjective:    Patient ID: Daniel Bradley., male    DOB: [birth year], 67 y.o.   MRN: [id number]  HPI  Patient presents to clinic today for 69-month follow-up of chronic conditions.  HTN: His BP today is 134/62.  He is taking Amlodipine and Metoprolol as prescribed.  ECG from 03/2020 reviewed.  HLD with CAD/Aortic Atherosclerosis status post MI: His last LDL was 83, triglycerides 86, 03/2022.  He denies myalgias on Rosuvastatin.  He is taking Metoprolol and Aspirin as well.  He tries to consume a low-fat diet.  He follows with cardiology.  DM2: His last A1c was 8.7%, 03/2022.  He is taking Rybelsus, Metformin and Glipizide as prescribed.  His sugars range 90-134.  He checks his feet routinely.  His last eye exam was 10/2021.  Flu 10/2021.  Pneumovax never.  Prevnar 20 12/2020.  COVID Pfizer x 2.  OSA: He averages 7 hours of sleep per night without the use of the CPAP.  He takes Unisom as needed to help him sleep.  Sleep study from 09/2017 reviewed.  OA: Mainly in his back and right hip.  He takes Tylenol as needed with some relief of symptoms.  He does not follow with orthopedics.  ED: He is not currently taking any medications for this.  He does not follow with urology.  He does report feeling down and depressed lately.  He reports this is situational.  He reports his girlfriend broke up with him and they are having to sell their home.    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  DX: E11.9 100 each 1   Alcohol Swabs (B-D SINGLE USE SWABS REGULAR) PADS Use to check blood sugar once a day.  DX: E11.9 100 each 1   amLODipine (NORVASC) 10 MG tablet Take 1 tablet (10 mg total) by mouth daily. 90 tablet 1   aspirin EC 81 MG tablet 81 mg daily. Swallow whole.     blood glucose meter kit and supplies Dispense based on patient and insurance preference. Use to check blood sugar daily as directed. DX Ell.9 1 each 0   Cholecalciferol 125 MCG (5000 UT) capsule Take 5,000 Units by mouth daily.     doxylamine, Sleep, (UNISOM) 25 MG tablet Take 25 mg by mouth at bedtime as needed.     glipiZIDE (GLUCOTROL) 10 MG tablet Take 1 tablet (10 mg total) by mouth 2 (two) times daily before a meal. 180 tablet 1   glucose blood (ACCU-CHEK GUIDE) test strip Use to check blood sugar once a day.  DX: E11.9  100 strip 0   metFORMIN (GLUCOPHAGE) 850 MG tablet Take 1 tablet (850 mg total) by mouth 2 (two) times daily with a meal. 180 tablet 1   metoprolol tartrate (LOPRESSOR) 25 MG tablet TAKE 1 TABLET(25 MG) BY MOUTH TWICE DAILY 180 tablet 0   rosuvastatin (CRESTOR) 20 MG tablet Take 1 tablet (20 mg total) by mouth daily. 90 tablet 1   Semaglutide 7 MG TABS Take 7 mg by mouth daily.     No current facility-administered medications for this visit.    No Known Allergies  Family History  Problem Relation Age of Onset   Alcoholism Mother    Alcoholism Father    Pancreatic cancer Brother    Prostate cancer Neg Hx    Kidney disease Neg Hx    Cancer - Colon Neg Hx     Social History   Socioeconomic History   Marital status: Single    Spouse name: Not on file   Number of children: 2   Years of  education: Not on file   Highest education level: Not on file  Occupational History   Occupation: [employer]    Employer: [employer]    Comment: retired  Tobacco Use   Smoking status: Never   Smokeless tobacco: Never  Vaping Use   Vaping Use: Never used  Substance and Sexual Activity   Alcohol use: No    Alcohol/week: 0.0 standard drinks of alcohol    Comment: rare   Drug use: No   Sexual activity: Not Currently  Other Topics Concern   Not on file  Social History Narrative   Lives with girlfriend, April   Social Determinants of Health   Financial Resource Strain: Low Risk  (01/01/2022)   Overall Financial Resource Strain (CARDIA)    Difficulty of Paying Living Expenses: Not very hard  Food Insecurity: No Food Insecurity (01/01/2022)   Hunger Vital Sign    Worried About Running Out of Food in the Last Year: Never true    Ran Out of Food in the Last Year: Never true  Transportation Needs: No Transportation Needs (01/01/2022)   PRAPARE - Administrator, Civil Service (Medical): No    Lack of Transportation (Non-Medical): No  Physical Activity: Sufficiently Active (01/01/2022)   Exercise Vital Sign    Days of Exercise per Week: 4 days    Minutes of Exercise per Session: 80 min  Stress: No Stress Concern Present (01/01/2022)   [employer] of Occupational Health - Occupational Stress Questionnaire    Feeling of Stress : Only a little  Social Connections: Moderately Integrated (01/01/2022)   Social Connection and Isolation Panel [NHANES]    Frequency of Communication with Friends and Family: More than three times a week    Frequency of Social Gatherings with Friends and Family: Once a week    Attends Religious Services: 1 to 4 times per year    Active Member of [employer] or Organizations: No    Attends Banker Meetings: Never    Marital Status: Living with partner  Intimate Partner Violence: Not At Risk (01/01/2022)   Humiliation,  Afraid, Rape, and Kick questionnaire    Fear of Current or Ex-Partner: No    Emotionally Abused: No    Physically Abused: No    Sexually Abused: No     Constitutional: Denies fever, malaise, fatigue, headache or abrupt weight changes.  HEENT: Denies eye pain, eye redness, ear pain, ringing in the ears, wax buildup, runny  nose, nasal congestion, bloody nose, or sore throat. Respiratory: Denies difficulty breathing, shortness of breath, cough or sputum production.   Cardiovascular: Denies chest pain, chest tightness, palpitations or swelling in the hands or feet.  Gastrointestinal: Denies abdominal pain, bloating, constipation, diarrhea or blood in the stool.  GU: Patient reports erectile dysfunction.  Denies urgency, frequency, pain with urination, burning sensation, blood in urine, odor or discharge. Musculoskeletal: Patient reports back and hip pain.  Denies decrease in range of motion, difficulty with gait, muscle pain or joint swelling.  Skin: Denies redness, rashes, lesions or ulcercations.  Neurological: Pt reports insomnia. Denies dizziness, difficulty with memory, difficulty with speech or problems with balance and coordination.  Psych: Pt presents depression. Denies anxiety, SI/HI.  No other specific complaints in a complete review of systems (except as listed in HPI above).  Objective:   Physical Exam  BP 134/62 (BP Location: Left Arm, Patient Position: Sitting, Cuff Size: Large)   Pulse 85   Temp (!) 97.1 F (36.2 C) (Temporal)   Wt 273 lb (123.8 kg)   SpO2 96%   BMI 36.02 kg/m   Wt Readings from Last 3 Encounters:  03/27/22 285 lb (129.3 kg)  01/07/22 280 lb (127 kg)  12/09/21 285 lb (129.3 kg)    General: Appears his stated age, obese, in NAD. Skin: Warm, dry and intact. No ulcerations noted. HEENT: Head: normal shape and size; Eyes: sclera white, no icterus, conjunctiva pink, PERRLA and EOMs intact;  Cardiovascular: Normal rate and rhythm. S1,S2 noted.  No  murmur, rubs or gallops noted. No JVD or BLE edema. No carotid bruits noted. Pulmonary/Chest: Normal effort and positive vesicular breath sounds. No respiratory distress. No wheezes, rales or ronchi noted.  Musculoskeletal: No difficulty with gait.  Neurological: Alert and oriented. Coordination normal.  Psychiatric: Mood and affect flat.  Tearful. Judgment and thought content normal.     BMET    Component Value Date/Time   NA 141 01/07/2022 0917   NA 139 03/30/2020 1015   NA 139 04/21/2012 0641   K 4.5 01/07/2022 0917   K 3.8 04/21/2012 0641   CL 104 01/07/2022 0917   CL 107 04/21/2012 0641   CO2 27 01/07/2022 0917   CO2 27 04/21/2012 0641   GLUCOSE 152 (H) 01/07/2022 0917   GLUCOSE 212 (H) 04/21/2012 0641   BUN 19 01/07/2022 0917   BUN 14 03/30/2020 1015   BUN 22 (H) 04/21/2012 0641   CREATININE 1.00 01/07/2022 0917   CALCIUM 10.1 01/07/2022 0917   CALCIUM 8.7 04/21/2012 0641   GFRNONAA >60 06/23/2021 2133   GFRNONAA 51 (L) 04/21/2012 0641   GFRAA 100 03/30/2020 1015   GFRAA 59 (L) 04/21/2012 0641    Lipid Panel     Component Value Date/Time   CHOL 150 01/07/2022 0917   CHOL 128 03/30/2020 1015   TRIG 86 01/07/2022 0917   HDL 49 01/07/2022 0917   HDL 41 03/30/2020 1015   CHOLHDL 3.1 01/07/2022 0917   VLDL 14 08/20/2017 0359   LDLCALC 83 01/07/2022 0917    CBC    Component Value Date/Time   WBC 6.4 01/07/2022 0917   RBC 5.55 01/07/2022 0917   HGB 13.9 01/07/2022 0917   HGB 14.0 03/30/2020 1015   HCT 44.2 01/07/2022 0917   HCT 42.5 03/30/2020 1015   PLT 315 01/07/2022 0917   PLT 306 03/30/2020 1015   MCV 79.6 (L) 01/07/2022 0917   MCV 79 03/30/2020 1015   MCV 80 04/21/2012 0641  MCH 25.0 (L) 01/07/2022 0917   MCHC 31.4 (L) 01/07/2022 0917   RDW 14.5 01/07/2022 0917   RDW 14.6 03/30/2020 1015   RDW 14.9 (H) 04/21/2012 0641   LYMPHSABS 1.8 03/30/2020 1015   MONOABS 0.5 04/03/2019 0757   EOSABS 0.3 03/30/2020 1015   BASOSABS 0.1 03/30/2020 1015     Hgb A1C Lab Results  Component Value Date   HGBA1C 8.7 (A) 03/27/2022            Assessment & Plan:    RTC in 6 months for annual exam Nicki Reaper, NP

## 2022-07-01 NOTE — Assessment & Plan Note (Signed)
Encourage weight loss as this can reduce joint pain Okay to take Tylenol OTC as needed

## 2022-07-01 NOTE — Assessment & Plan Note (Signed)
Lipid profile reviewed Encouraged him to consume a low-fat, low-carb diet Continue amlodipine, metoprolol, rosuvastatin and aspirin 

## 2022-07-01 NOTE — Assessment & Plan Note (Signed)
POCT A1c Urine microalbumin has been checked within the last year Encouraged to consume a low-carb diet and exercise for weight loss Continue Rybelsus, metformin and glipizide Encourage routine eye Immunizations UTD

## 2022-07-01 NOTE — Assessment & Plan Note (Signed)
Encourage diet and exercise for weight loss 

## 2022-07-01 NOTE — Assessment & Plan Note (Signed)
Lipid profile reviewed Encouraged him to send a low-fat diet Continue rosuvastatin

## 2022-07-01 NOTE — Assessment & Plan Note (Signed)
Encourage weight loss as this can reduce sleep apnea symptoms Not compliant with CPAP

## 2022-07-01 NOTE — Assessment & Plan Note (Signed)
Lipid profile reviewed Encouraged him to consume a low-fat, low-carb diet Continue amlodipine, metoprolol, rosuvastatin and aspirin

## 2022-07-01 NOTE — Assessment & Plan Note (Signed)
Lipid profile reviewed Encouraged him to consume low-fat diet Continue rosuvastatin and aspirin

## 2022-07-02 LAB — LIPID PANEL
Cholesterol: 136 mg/dL (ref <200)
HDL: 48 mg/dL (ref >=40)
LDL Cholesterol (Calc): 69 mg/dL (calc)
Non-HDL Cholesterol (Calc): 88 mg/dL (calc) (ref <130)
Total CHOL/HDL Ratio: 2.8 (calc) (ref <5.0)
Triglycerides: 108 mg/dL (ref <150)

## 2022-07-02 LAB — CBC
Hemoglobin: 14 g/dL (ref 13.2–17.1)
MCH: 25 pg — ABNORMAL LOW (ref 27.0–33.0)
MCHC: 31.5 g/dL — ABNORMAL LOW (ref 32.0–36.0)
MPV: 10.1 fL (ref 7.5–12.5)
Platelets: 332 10*3/uL (ref 140–400)
RBC: 5.61 10*6/uL (ref 4.20–5.80)
RDW: 14.5 % (ref 11.0–15.0)
WBC: 6.7 10*3/uL (ref 3.8–10.8)

## 2022-07-02 LAB — COMPLETE METABOLIC PANEL WITH GFR
AG Ratio: 1.6 (calc) (ref 1.0–2.5)
ALT: 15 U/L (ref 9–46)
AST: 14 U/L (ref 10–35)
Albumin: 4.4 g/dL (ref 3.6–5.1)
Alkaline phosphatase (APISO): 98 U/L (ref 35–144)
BUN: 16 mg/dL (ref 7–25)
CO2: 28 mmol/L (ref 20–32)
Calcium: 10.3 mg/dL (ref 8.6–10.3)
Chloride: 105 mmol/L (ref 98–110)
Creat: 1.08 mg/dL (ref 0.70–1.35)
Globulin: 2.8 g/dL (calc) (ref 1.9–3.7)
Glucose, Bld: 90 mg/dL (ref 65–139)
Potassium: 4.7 mmol/L (ref 3.5–5.3)
Sodium: 142 mmol/L (ref 135–146)
Total Bilirubin: 0.5 mg/dL (ref 0.2–1.2)
Total Protein: 7.2 g/dL (ref 6.1–8.1)
eGFR: 76 mL/min/{1.73_m2} (ref >=60)

## 2022-07-11 ENCOUNTER — Ambulatory Visit: Payer: Medicare HMO | Admitting: Pharmacist

## 2022-07-11 DIAGNOSIS — I10 Essential (primary) hypertension: Secondary | ICD-10-CM

## 2022-07-11 DIAGNOSIS — E119 Type 2 diabetes mellitus without complications: Secondary | ICD-10-CM

## 2022-07-11 DIAGNOSIS — E7849 Other hyperlipidemia: Secondary | ICD-10-CM

## 2022-07-11 DIAGNOSIS — E1169 Type 2 diabetes mellitus with other specified complication: Secondary | ICD-10-CM

## 2022-07-11 NOTE — Patient Instructions (Signed)
Visit Information  Thank you for taking time to visit with me today. Please don't hesitate to contact me if I can be of assistance to you before our next scheduled telephone appointment.  Following are the goals we discussed today:   Goals Addressed             This Visit's Progress    Pharmacy Goals       Our goal A1c is less than 7%. This corresponds with fasting sugars less than 130 and 2 hour after meal sugars less than 180. Please keep a log of your results when checking your blood sugar   Our goal bad cholesterol, or LDL, is less than 70 . This is why it is important to continue taking your rosuvastatin.  Check your blood pressure twice weekly, and any time you have concerning symptoms like headache, chest pain, dizziness, shortness of breath, or vision changes.   Our goal is less than 130/80.  To appropriately check your blood pressure, make sure you do the following:  1) Avoid caffeine, exercise, or tobacco products for 30 minutes before checking. Empty your bladder. 2) Sit with your back supported in a flat-backed chair. Rest your arm on something flat (arm of the chair, table, etc). 3) Sit still with your feet flat on the floor, resting, for at least 5 minutes.  4) Check your blood pressure. Take 1-2 readings.  5) Write down these readings and bring with you to any provider appointments.  Bring your home blood pressure machine with you to a provider's office for accuracy comparison at least once a year.   Make sure you take your blood pressure medications before you come to any office visit, even if you were asked to fast for labs.   Estelle Grumbles, PharmD, BCACP Clinical Pharmacist [facility] [phone]          Our next appointment is by telephone on 08/08/2022 at 10 am  Please call the care guide team at [phone] if you need to cancel or reschedule your appointment.    Patient verbalizes understanding of instructions  and care plan provided today and agrees to view in MyChart. Active MyChart status and patient understanding of how to access instructions and care plan via MyChart confirmed with patient.

## 2022-07-11 NOTE — Chronic Care Management (AMB) (Signed)
Chronic Care Management CCM Pharmacy Note  07/11/2022 Name:  Daniel Bradley. MRN:  [id number] DOB:  1955-11-19   Subjective: Daniel Salvino. is an 67 y.o. year old male who is a primary patient of [name], NP.  The CCM team was consulted for assistance with disease management and care coordination needs.    Engaged with patient by telephone for follow up visit for pharmacy case management and/or care coordination services.   Objective:  Medications Reviewed Today     Reviewed by [name], RPH-CPP (Pharmacist) on 07/11/22 at 1110  Med List Status: <None>   Medication Order Taking? Sig Documenting Provider Last Dose Status Informant  Accu-Chek Softclix Lancets lancets [id number]  Use to check Blood sugar once a day.  DX: E11.9 [name], NP  Active   Alcohol Swabs (B-D SINGLE USE SWABS REGULAR) PADS [id number]  Use to check blood sugar once a day.  DX: E11.9 [name], NP  Active   amLODipine (NORVASC) 10 MG tablet [id number]  Take 1 tablet (10 mg total) by mouth daily. [name], NP  Active   aspirin EC 81 MG tablet [id number]  81 mg daily. Swallow whole. [provider]  Active   blood glucose meter kit and supplies [id number]  Dispense based on patient and insurance preference. Use to check blood sugar daily as directed. DX Ell.9 [name], NP  Active   Cholecalciferol 125 MCG (5000 UT) capsule [id number]  Take 5,000 Units by mouth daily. [provider]  Active   citalopram (CELEXA) 10 MG tablet [id number]  Take 1 tablet (10 mg total) by mouth daily. [name], NP  Active   doxylamine, Sleep, (UNISOM) 25 MG tablet [id number]  Take 25 mg by mouth at bedtime as needed. [provider]  Active   glipiZIDE (GLUCOTROL) 10 MG tablet [id number] Yes Take 1 tablet (10 mg total) by mouth 2 (two) times daily before a meal. Baity, [name], NP Taking Active   glucose blood (ACCU-CHEK GUIDE) test strip [id number]  Use to check  blood sugar once a day.  DX: E11.9 [name], NP  Active   metFORMIN (GLUCOPHAGE) 850 MG tablet [id number] Yes Take 1 tablet (850 mg total) by mouth 2 (two) times daily with a meal. [name], NP Taking Active   metoprolol tartrate (LOPRESSOR) 25 MG tablet [id number]  TAKE 1 TABLET(25 MG) BY MOUTH TWICE DAILY Baity, [name], NP  Active   rosuvastatin (CRESTOR) 20 MG tablet [id number] Yes Take 1 tablet (20 mg total) by mouth daily. [name], NP Taking Active   Semaglutide 7 MG TABS [id number] Yes Take 7 mg by mouth daily. [provider] Taking Active             Pertinent Labs:  Lab Results  Component Value Date   HGBA1C 6.9 07/01/2022   Lab Results  Component Value Date   CHOL 136 07/01/2022   HDL 48 07/01/2022   LDLCALC 69 07/01/2022   TRIG 108 07/01/2022   CHOLHDL 2.8 07/01/2022   Lab Results  Component Value Date   CREATININE 1.08 07/01/2022   BUN 16 07/01/2022   NA 142 07/01/2022   K 4.7 07/01/2022   CL 105 07/01/2022   CO2 28 07/01/2022   BP Readings from Last 3 Encounters:  07/01/22 134/62  03/27/22 118/70  01/07/22 (!) 146/80   Pulse Readings from Last 3 Encounters:  07/01/22 85  03/27/22 77  01/07/22 89     SDOH:  (Social Determinants of Health) assessments and interventions performed:  SDOH Interventions    Flowsheet Row Office Visit from 07/01/2022 in [facility] Office Visit from 03/27/2022 in [facility] Office Visit from 01/07/2022 in [facility] Chronic Care Management from 01/01/2022 in [facility] Office Visit from 12/09/2021 in [facility] Clinical Support from 08/29/2021 in [facility]  SDOH Interventions        Food Insecurity Interventions -- -- -- Intervention Not Indicated -- Intervention Not Indicated  Housing Interventions -- -- -- Intervention Not  Indicated -- Intervention Not Indicated  Transportation Interventions -- -- -- Intervention Not Indicated -- Intervention Not Indicated  Utilities Interventions -- -- -- Intervention Not Indicated -- --  Alcohol Usage Interventions -- -- -- Intervention Not Indicated (Score <7) -- --  Depression Interventions/Treatment  Medication Patient refuses Treatment Patient refuses Treatment -- Patient refuses Treatment PHQ2-9 Score <4 Follow-up Not Indicated  Financial Strain Interventions -- -- -- Other (Comment)  [needs assistance with paying for [employer] D consult pending] -- Intervention Not Indicated  Physical Activity Interventions -- -- -- Intervention Not Indicated, Other (Comments)  [goes to the gym, 3 or 4 days a week, does walking, bench presses, weights, and knee presses] -- Intervention Not Indicated  Stress Interventions -- -- -- Intervention Not Indicated -- Intervention Not Indicated  Social Connections Interventions -- -- -- Intervention Not Indicated, Other (Comment)  [lives with his girlfriend Daniel Bradley, the patient has been divorced 3 times, good support system, has 2 sons] -- Intervention Not Indicated       CCM Care Plan  Review of patient past medical history, allergies, medications, health status, including review of consultants reports, laboratory and other test data, was performed as part of comprehensive evaluation and provision of chronic care management services.   Care Plan : General Pharmacy (Adult)  Updates made by [name], RPH-CPP since 07/11/2022 12:00 AM     Problem: Disease Progression      Long-Range Goal: Disease Progression Prevented or Minimized   Start Date: 01/10/2022  Expected End Date: 04/10/2022  Recent Progress: On track  Priority: High  Note:   Current Barriers:  Unable to independently afford treatment regimen Patient approved for patient assistance for Rybelsus from [employer] through 02/10/2023 Unable to achieve control of  A1C Unable to achieve control of blood pressure   Pharmacist Clinical Goal(s):  patient will verbalize ability to afford treatment regimen achieve control of T2DM as evidenced by A1C  through collaboration with PharmD and provider achieve control of HTN as evidenced by blood pressure through collaboration with PharmD and provider.    Interventions: 1:1 collaboration with [name], NP regarding development and update of comprehensive plan of care as evidenced by provider attestation and co-signature Inter-disciplinary care team collaboration (see longitudinal plan of care) Reports recently had back problems at the end of March/beginning of Daniel Bradley, but now feeling better for the past week Reports takes his medications directly from pill bottles and has a routine to aid with adherence; denies missed doses  Diabetes:  Controlled based on latest A1C; current treatment: Glipizide 10 mg twice daily before breakfast and supper (30 minutes prior to meal) Metformin 850 mg twice daily Rybelsus 7 mg daily  Confirms taking on an empty stomach, ?30 minutes before the first food, beverage,  or other oral medications of the day with ?4 oz of plain water only Medications tried in the past: Rybelsus (cost); Ozempic (cost); Evaristo Bury (cost) Recent fasting blood sugar readings ranging 110-134 Denies hypoglycemic symptoms Current exercise: reports missed gym last week due to busy moving, but then plans to get back next week (~30-45 mins walks, chest press, knee press) x 3-4 days/week Statin: rosuvastatin 20 mg daily Have reviewed long term cardiovascular and renal outcomes of uncontrolled blood sugar Reviewed goal A1c, goal fasting, and goal 2 hour post prandial glucose Reports has noticed impact on his appetite/benefit of weight loss with Rybelsus Have encouraged dietary modifications including: Encouraged patient to have regular well-balance meals throughout the day (including breakfast), while  controlling carbohydrate portion sizes Encouraged patient to review nutrition labels for carbohydrate content of foods Recommend to continue to check glucose, keep a log of the results, have this record to review during future appointments, but to contact office sooner if needed for readings outside of established parameters or symptoms Have counseled on s/s of low blood sugar and how to treat lows Patient carries glucose tablets with him in case needed for hypoglycemia Collaborated with [facility] Noreene Larsson [place] and PCP to assist patient with applying for patient assistance for Rybelsus from [employer] currently has ~30 days of Rybelsus 7 mg strength as sent by assistance program remaining Follow up with [employer] patient assistance today on behalf of patient. Per representative, refill of Rybelsus 7 mg Rx processed for patient on 5/20 and expected to be delivered to office within 10-14 business days from processing date  Hypertension:  Goal on Track (progressing): YES. Uncontrolled based on latest in office reading; current treatment: amlodipine 10 mg daily metoprolol 25 mg twice daily Patient has upper arm blood pressure monitor from [facility] over the counter benefit Recent home monitoring result: 5/19: 121/77, HR 81 Denies hypotensive symptoms such as dizziness or lightheadedness Current exercise: reports restarted going to gym ~80-90 mins walks, chest press, knee press x 3-4 days/week Reviewed long term cardiovascular and renal outcomes of uncontrolled blood pressure Reviewed appropriate blood pressure monitoring technique and reviewed goal blood pressure Encourage patient to limit salt/sodium intake Recommended to check home blood pressure and heart rate using upper arm monitor, keep a log of the results and have this record to review during future appointments   Patient Goals/Self-Care Activities patient will:  - take medications as prescribed as evidenced by patient report and  record review - check glucose , document, and provide at future appointments - check blood pressure, document, and provide at future appointments - collaborate with provider on medication access solutions       Plan: Telephone follow up appointment with care management team member scheduled for:  08/08/2022 at 10 am  Estelle Grumbles, PharmD, [facility] Clinical Pharmacist [facility] [phone]

## 2022-07-16 DIAGNOSIS — I25119 Atherosclerotic heart disease of native coronary artery with unspecified angina pectoris: Secondary | ICD-10-CM | POA: Diagnosis not present

## 2022-07-16 DIAGNOSIS — E669 Obesity, unspecified: Secondary | ICD-10-CM | POA: Diagnosis not present

## 2022-07-16 DIAGNOSIS — I214 Non-ST elevation (NSTEMI) myocardial infarction: Secondary | ICD-10-CM | POA: Diagnosis not present

## 2022-07-16 DIAGNOSIS — E785 Hyperlipidemia, unspecified: Secondary | ICD-10-CM | POA: Diagnosis not present

## 2022-07-16 DIAGNOSIS — I10 Essential (primary) hypertension: Secondary | ICD-10-CM | POA: Diagnosis not present

## 2022-07-16 DIAGNOSIS — E119 Type 2 diabetes mellitus without complications: Secondary | ICD-10-CM | POA: Diagnosis not present

## 2022-07-18 ENCOUNTER — Telehealth: Payer: Self-pay

## 2022-07-18 NOTE — Telephone Encounter (Signed)
The pt was notified that his Rybelsus samples is ready for pick up.

## 2022-07-23 DIAGNOSIS — I25119 Atherosclerotic heart disease of native coronary artery with unspecified angina pectoris: Secondary | ICD-10-CM | POA: Diagnosis not present

## 2022-08-08 ENCOUNTER — Telehealth: Payer: Self-pay | Admitting: Pharmacist

## 2022-08-08 ENCOUNTER — Telehealth: Payer: Medicare HMO

## 2022-08-08 NOTE — Telephone Encounter (Signed)
   Outreach Note  08/08/2022 Name: Daniel Bradley. MRN: [id number] DOB: 09-May-1955  Referred by: [name], NP Reason for referral : No chief complaint on file.   Today receive a call from patient requesting to reschedule our appointment for today. Patient reports that he is busy with moving today. Reschedule as requested  Follow Up Plan: Clinical Pharmacist will outreach to patient by telephone again on 09/01/2022 at 9:15 AM   Estelle Grumbles, PharmD, [facility] Clinical Pharmacist [facility] [phone]

## 2022-08-15 ENCOUNTER — Ambulatory Visit (INDEPENDENT_AMBULATORY_CARE_PROVIDER_SITE_OTHER): Payer: Medicare HMO

## 2022-08-15 ENCOUNTER — Telehealth: Payer: Medicare HMO

## 2022-08-15 DIAGNOSIS — E1169 Type 2 diabetes mellitus with other specified complication: Secondary | ICD-10-CM

## 2022-08-15 NOTE — Patient Instructions (Signed)
Please call the care guide team at [phone] if you need to cancel or reschedule your appointment.   If you are experiencing a Mental Health or Behavioral Health Crisis or need someone to talk to, please call the Suicide and Crisis Lifeline: 988 call the [place] National Suicide Prevention Lifeline: 951-603-9174 or TTY: (216)060-3132 TTY (228)341-0546) to talk to a trained counselor call 1-800-273-TALK (toll free, 24 hour hotline)   Following is a copy of the CCM Program Consent:  CCM service includes personalized support from designated clinical staff supervised by the physician, including individualized plan of care and coordination with other care providers 24/7 contact phone numbers for assistance for urgent and routine care needs. Service will only be billed when office clinical staff spend 20 minutes or more in a month to coordinate care. Only one practitioner may furnish and bill the service in a calendar month. The patient may stop CCM services at amy time (effective at the end of the month) by phone call to the office staff. The patient will be responsible for cost sharing (co-pay) or up to 20% of the service fee (after annual deductible is met)  Following is a copy of your full provider care plan:   Goals Addressed             This Visit's Progress    CCM Expected Outcome:  Monitor, Self-Manage and Reduce Symptoms of Diabetes       Current Barriers:  Knowledge Deficits related to the significance of controlling blood sugars and keeping A1C levels down Care Coordination needs related to medication cost constraints  in a patient with DM Chronic Disease Management support and education needs related to effective management of DM Financial Constraints.  Lab Results  Component Value Date   HGBA1C 6.9 07/01/2022     Planned Interventions: Provided education to patient about basic DM disease process. Review and education provided. The patient is doing well and has improved his  A1C level to at goal. Praised the patient for doing well with getting his numbers down. Continues to work with pharm D. Saw the pcp in May. The patient has made some changes and now is in his own apartment. He says he has some things to get from the old house but he and his girlfriend, April have split up and he says it is for the best. He said he is tired of the arguing and fighting. Reflective listening and support given. Will continue to monitor for changes or new needs.  Reviewed medications with patient and discussed importance of medication adherence. The patient is compliant with medications. The patient has been approved for Rybelsus and is thankful for this. This is a medication that helped him a lot in the past. Review of the notes and the patient will be able to pick up the medications at the office when they are in. The patient continues to work with pharm D on a regular basis. Education and support given. ;        Reviewed prescribed diet with patient heart healthy/ADA diet.  He admits sweets are his weak points. Education provided on eating balanced meals and monitoring for abnormal readings. The patient is being more mindful of his eating habits. States he is eating better and he is watching what he eats. Encouraged the patient to continue with good eating habits and not to get hooked on junk foods and fast foods now that he is living on his own. Education and support provided.  Counseled on importance of  regular laboratory monitoring as prescribed. Has regular lab work done;        Discussed plans with patient for ongoing care management follow up and provided patient with direct contact information for care management team;      Provided patient with written educational materials related to hypo and hyperglycemia and importance of correct treatment.  The patient denies any sx and sx of hypo or hyperglycemia. Review and education provided. States he does notice his blood sugar level dropping  after he has exercised. Education provided.        Reviewed scheduled/upcoming provider appointments including:  01-12-2023 at 840 am Advised patient, providing education and rationale, to check cbg twice daily and when you have symptoms of low or high blood sugar and record. The patient is taking his blood sugars. Review of the goal of fasting is <130 and post prandial of <180.   The patient usually has several readings to give the [facility] but he admits since having to move and get settled in his apartment he has not been taking. Discussed the importance of checking his blood sugars on a regular basis once he gets settled into his apartments. Review of monitoring for acute changes.  Call provider for findings outside established parameters;       Referral made to pharmacy team for assistance with assistance with cost constraints related to Rybelsus and inability to pay for refills. The patient is working with the pharm D for medication cost constraints. The patient has been approved for Rybelsus and will be covered through 2024. Continues to work with the pharm D on a regular basis Review of patient status, including review of consultants reports, relevant laboratory and other test results, and medications completed;       Advised patient to discuss changes in DM health and well being with provider;      Screening for signs and symptoms of depression related to chronic disease state;        Assessed social determinant of health barriers;         Symptom Management: Take medications as prescribed   Attend all scheduled provider appointments Call provider office for new concerns or questions  call the Suicide and Crisis Lifeline: 988 call the [place] National Suicide Prevention Lifeline: 270-290-2460 or TTY: 7693199990 TTY 707-466-1710) to talk to a trained counselor call 1-800-273-TALK (toll free, 24 hour hotline) if experiencing a Mental Health or Behavioral Health Crisis  check feet daily for cuts,  sores or redness trim toenails straight across manage portion size wash and dry feet carefully every day wear comfortable, cotton socks wear comfortable, well-fitting shoes  Follow Up Plan: Telephone follow up appointment with care management team member scheduled for: 10-17-2022 at 100 pm       CCM Expected Outcome:  Monitor, Self-Manage and Reduce Symptoms of: HLD       Current Barriers:  Chronic Disease Management support and education needs related to effective management of HLD BP Readings from Last 3 Encounters:  07/01/22 134/62  03/27/22 118/70  01/07/22 (!) 146/80    Lab Results  Component Value Date   CHOL 136 07/01/2022   CHOL 150 01/07/2022   CHOL 229 (H) 10/15/2021   Lab Results  Component Value Date   HDL 49 01/07/2022   HDL 49 10/15/2021   HDL 51 06/20/2021   Lab Results  Component Value Date   LDLCALC 69 07/01/2022   LDLCALC 83 01/07/2022   LDLCALC 152 (H) 10/15/2021   Lab Results  Component Value Date   TRIG 86 01/07/2022   TRIG 153 (H) 10/15/2021   TRIG 177 (H) 06/20/2021   Lab Results  Component Value Date   CHOLHDL 2.8 07/01/2022   CHOLHDL 3.1 01/07/2022   CHOLHDL 4.7 10/15/2021    Planned Interventions: Provider established cholesterol goals reviewed.Review of goals. Review of goals and the patient is at goal currently. Review of making sure he is taking his medications and following the plan of care established by the provider. The patient is doing well with staying at goal with cholesterol readings. He also is having more stable blood pressures. Education and support given.  ; Counseled on importance of regular laboratory monitoring as prescribed. Has lab work done on a regular basis. Most recent in May. Provided HLD educational materials. Education and support given; Reviewed role and benefits of statin for ASCVD risk reduction; The patient has not been taking his blood pressures recently due to moving into an apartment. Education and support  given.  Discussed strategies to manage statin-induced myalgias; Reviewed importance of limiting foods high in cholesterol. Review of heart healthy/ADA diet-He is trying to be more mindful of foods that are not good for him. Sweets is his weakness, is doing better with managing his dietary restrictions. Will continue to monitor for changes; Reviewed exercise goals and target of 150 minutes per week. Patient goes to the gym and works out on a regular basis. Usually works out one hour and 15 minutes. He states he feels a lot better since going to the gym regularly. Education and support given; Screening for signs and symptoms of depression related to chronic disease state;  Assessed social determinant of health barriers;   Symptom Management: Take medications as prescribed   Attend all scheduled provider appointments Call provider office for new concerns or questions  call the Suicide and Crisis Lifeline: 988 call the [place] National Suicide Prevention Lifeline: 848-273-3861 or TTY: 314-622-6652 TTY (786)211-0031) to talk to a trained counselor call 1-800-273-TALK (toll free, 24 hour hotline) if experiencing a Mental Health or Behavioral Health Crisis  - call for medicine refill 2 or 3 days before it runs out - take all medications exactly as prescribed - call doctor with any symptoms you believe are related to your medicine - call doctor when you experience any new symptoms - go to all doctor appointments as scheduled - adhere to prescribed diet: heart healthy/ADA diet   Follow Up Plan: Telephone follow up appointment with care management team member scheduled for: 10-17-2022 at 100 pm          Patient verbalizes understanding of instructions and care plan provided today and agrees to view in MyChart. Active MyChart status and patient understanding of how to access instructions and care plan via MyChart confirmed with patient.  Telephone follow up appointment with care management team  member scheduled for: 10-17-2022 at 1 pm

## 2022-08-15 NOTE — Chronic Care Management (AMB) (Signed)
Chronic Care Management   CCM RN Visit Note  08/15/2022 Name: Daniel Bradley. MRN: [id number] DOB: [birth year]  Subjective: Daniel Bradley. is a 67 y.o. year old male who is a primary care patient of Daniel Munroe, NP. The patient was referred to the Chronic Care Management team for assistance with care management needs subsequent to provider initiation of CCM services and plan of care.    Today's Visit:  Engaged with patient by telephone for follow up visit.        Goals Addressed             This Visit's Progress    CCM Expected Outcome:  Monitor, Self-Manage and Reduce Symptoms of Diabetes       Current Barriers:  Knowledge Deficits related to the significance of controlling blood sugars and keeping A1C levels down Care Coordination needs related to medication cost constraints  in a patient with DM Chronic Disease Management support and education needs related to effective management of DM Financial Constraints.  Lab Results  Component Value Date   HGBA1C 6.9 07/01/2022     Planned Interventions: Provided education to patient about basic DM disease process. Review and education provided. The patient is doing well and has improved his A1C level to at goal. Praised the patient for doing well with getting his numbers down. Continues to work with pharm D. Saw the pcp in May. The patient has made some changes and now is in his own apartment. He says he has some things to get from the old house but he and his girlfriend, Daniel Bradley have split up and he says it is for the best. He said he is tired of the arguing and fighting. Reflective listening and support given. Will continue to monitor for changes or new needs.  Reviewed medications with patient and discussed importance of medication adherence. The patient is compliant with medications. The patient has been approved for Rybelsus and is thankful for this. This is a medication that helped him a lot in the past. Review of the notes and  the patient will be able to pick up the medications at the office when they are in. The patient continues to work with pharm D on a regular basis. Education and support given. ;        Reviewed prescribed diet with patient heart healthy/ADA diet.  He admits sweets are his weak points. Education provided on eating balanced meals and monitoring for abnormal readings. The patient is being more mindful of his eating habits. States he is eating better and he is watching what he eats. Encouraged the patient to continue with good eating habits and not to get hooked on junk foods and fast foods now that he is living on his own. Education and support provided.  Counseled on importance of regular laboratory monitoring as prescribed. Has regular lab work done;        Discussed plans with patient for ongoing care management follow up and provided patient with direct contact information for care management team;      Provided patient with written educational materials related to hypo and hyperglycemia and importance of correct treatment.  The patient denies any sx and sx of hypo or hyperglycemia. Review and education provided. States he does notice his blood sugar level dropping after he has exercised. Education provided.        Reviewed scheduled/upcoming provider appointments including:  01-12-2023 at 840 am Advised patient, providing education and rationale, to check cbg twice daily  and when you have symptoms of low or high blood sugar and record. The patient is taking his blood sugars. Review of the goal of fasting is <130 and post prandial of <180.   The patient usually has several readings to give the [facility] but he admits since having to move and get settled in his apartment he has not been taking. Discussed the importance of checking his blood sugars on a regular basis once he gets settled into his apartments. Review of monitoring for acute changes.  Call provider for findings outside established parameters;        Referral made to pharmacy team for assistance with assistance with cost constraints related to Rybelsus and inability to pay for refills. The patient is working with the pharm D for medication cost constraints. The patient has been approved for Rybelsus and will be covered through 2024. Continues to work with the pharm D on a regular basis Review of patient status, including review of consultants reports, relevant laboratory and other test results, and medications completed;       Advised patient to discuss changes in DM health and well being with provider;      Screening for signs and symptoms of depression related to chronic disease state;        Assessed social determinant of health barriers;         Symptom Management: Take medications as prescribed   Attend all scheduled provider appointments Call provider office for new concerns or questions  call the Suicide and Crisis Lifeline: 988 call the [place] National Suicide Prevention Lifeline: (317)140-1391 or TTY: 249-791-5486 TTY 9132004044) to talk to a trained counselor call 1-800-273-TALK (toll free, 24 hour hotline) if experiencing a Mental Health or Behavioral Health Crisis  check feet daily for cuts, sores or redness trim toenails straight across manage portion size wash and dry feet carefully every day wear comfortable, cotton socks wear comfortable, well-fitting shoes  Follow Up Plan: Telephone follow up appointment with care management team member scheduled for: 10-17-2022 at 100 pm       CCM Expected Outcome:  Monitor, Self-Manage and Reduce Symptoms of: HLD       Current Barriers:  Chronic Disease Management support and education needs related to effective management of HLD BP Readings from Last 3 Encounters:  07/01/22 134/62  03/27/22 118/70  01/07/22 (!) 146/80    Lab Results  Component Value Date   CHOL 136 07/01/2022   CHOL 150 01/07/2022   CHOL 229 (H) 10/15/2021   Lab Results  Component Value Date   HDL 49  01/07/2022   HDL 49 10/15/2021   HDL 51 06/20/2021   Lab Results  Component Value Date   LDLCALC 69 07/01/2022   LDLCALC 83 01/07/2022   LDLCALC 152 (H) 10/15/2021   Lab Results  Component Value Date   TRIG 86 01/07/2022   TRIG 153 (H) 10/15/2021   TRIG 177 (H) 06/20/2021   Lab Results  Component Value Date   CHOLHDL 2.8 07/01/2022   CHOLHDL 3.1 01/07/2022   CHOLHDL 4.7 10/15/2021    Planned Interventions: Provider established cholesterol goals reviewed.Review of goals. Review of goals and the patient is at goal currently. Review of making sure he is taking his medications and following the plan of care established by the provider. The patient is doing well with staying at goal with cholesterol readings. He also is having more stable blood pressures. Education and support given.  ; Counseled on importance of regular laboratory monitoring as  prescribed. Has lab work done on a regular basis. Most recent in May. Provided HLD educational materials. Education and support given; Reviewed role and benefits of statin for ASCVD risk reduction; The patient has not been taking his blood pressures recently due to moving into an apartment. Education and support given.  Discussed strategies to manage statin-induced myalgias; Reviewed importance of limiting foods high in cholesterol. Review of heart healthy/ADA diet-He is trying to be more mindful of foods that are not good for him. Sweets is his weakness, is doing better with managing his dietary restrictions. Will continue to monitor for changes; Reviewed exercise goals and target of 150 minutes per week. Patient goes to the gym and works out on a regular basis. Usually works out one hour and 15 minutes. He states he feels a lot better since going to the gym regularly. Education and support given; Screening for signs and symptoms of depression related to chronic disease state;  Assessed social determinant of health barriers;   Symptom  Management: Take medications as prescribed   Attend all scheduled provider appointments Call provider office for new concerns or questions  call the Suicide and Crisis Lifeline: 988 call the [place] National Suicide Prevention Lifeline: (978)780-7849 or TTY: 573-865-7830 TTY (340)468-5078) to talk to a trained counselor call 1-800-273-TALK (toll free, 24 hour hotline) if experiencing a Mental Health or Behavioral Health Crisis  - call for medicine refill 2 or 3 days before it runs out - take all medications exactly as prescribed - call doctor with any symptoms you believe are related to your medicine - call doctor when you experience any new symptoms - go to all doctor appointments as scheduled - adhere to prescribed diet: heart healthy/ADA diet   Follow Up Plan: Telephone follow up appointment with care management team member scheduled for: 10-17-2022 at 100 pm          Plan:Telephone follow up appointment with care management team member scheduled for:  10-17-2022 at 1 pm  Alto Denver RN, MSN, CCM RN Care Manager  Chronic Care Management Direct Number: [phone]

## 2022-08-20 DIAGNOSIS — E119 Type 2 diabetes mellitus without complications: Secondary | ICD-10-CM | POA: Diagnosis not present

## 2022-08-20 DIAGNOSIS — I10 Essential (primary) hypertension: Secondary | ICD-10-CM | POA: Diagnosis not present

## 2022-08-20 DIAGNOSIS — I2510 Atherosclerotic heart disease of native coronary artery without angina pectoris: Secondary | ICD-10-CM | POA: Diagnosis not present

## 2022-08-20 DIAGNOSIS — E669 Obesity, unspecified: Secondary | ICD-10-CM | POA: Diagnosis not present

## 2022-08-20 DIAGNOSIS — Z6836 Body mass index (BMI) 36.0-36.9, adult: Secondary | ICD-10-CM | POA: Diagnosis not present

## 2022-08-20 DIAGNOSIS — I25119 Atherosclerotic heart disease of native coronary artery with unspecified angina pectoris: Secondary | ICD-10-CM | POA: Diagnosis not present

## 2022-08-20 DIAGNOSIS — I214 Non-ST elevation (NSTEMI) myocardial infarction: Secondary | ICD-10-CM | POA: Diagnosis not present

## 2022-08-20 DIAGNOSIS — E785 Hyperlipidemia, unspecified: Secondary | ICD-10-CM | POA: Diagnosis not present

## 2022-09-01 ENCOUNTER — Ambulatory Visit: Payer: Medicare HMO | Admitting: Pharmacist

## 2022-09-01 DIAGNOSIS — E1169 Type 2 diabetes mellitus with other specified complication: Secondary | ICD-10-CM

## 2022-09-01 DIAGNOSIS — I10 Essential (primary) hypertension: Secondary | ICD-10-CM

## 2022-09-01 NOTE — Chronic Care Management (AMB) (Signed)
Chronic Care Management CCM Pharmacy Note  09/01/2022 Name:  Daniel Bradley. MRN:  [id number] DOB:  26-Nov-1955   Subjective: Daniel Bradley. is an 67 y.o. year old male who is a primary patient of [name], NP.  The CCM team was consulted for assistance with disease management and care coordination needs.    Engaged with patient by telephone for follow up visit for pharmacy case management and/or care coordination services.   Objective:  Medications Reviewed Today     Reviewed by [name], RPH-CPP (Pharmacist) on 09/01/22 at 1026  Med List Status: <None>   Medication Order Taking? Sig Documenting Provider Last Dose Status Informant  Accu-Chek Softclix Lancets lancets [id number]  Use to check Blood sugar once a day.  DX: E11.9 [name], NP  Active   Alcohol Swabs (B-Bradley SINGLE USE SWABS REGULAR) PADS [id number]  Use to check blood sugar once a day.  DX: E11.9 [name], NP  Active   amLODipine (NORVASC) 10 MG tablet [id number] Yes Take 1 tablet (10 mg total) by mouth daily. [name], NP Taking Active   aspirin EC 81 MG tablet [id number] Yes 81 mg daily. Swallow whole. [provider] Taking Active   blood glucose meter kit and supplies [id number]  Dispense based on patient and insurance preference. Use to check blood sugar daily as directed. DX Ell.9 [name], NP  Active   Cholecalciferol 125 MCG (5000 UT) capsule [id number] Yes Take 5,000 Units by mouth daily. [provider] Taking Active   citalopram (CELEXA) 10 MG tablet [id number] Yes Take 1 tablet (10 mg total) by mouth daily. [name], NP Taking Active   doxylamine, Sleep, (UNISOM) 25 MG tablet [id number] Yes Take 25 mg by mouth at bedtime as needed. [provider] Taking Active   glipiZIDE (GLUCOTROL) 10 MG tablet [id number] Yes Take 1 tablet (10 mg total) by mouth 2 (two) times daily before a meal. Baity, [name], NP Taking Active   glucose blood  (ACCU-CHEK GUIDE) test strip [id number]  Use to check blood sugar once a day.  DX: E11.9 [name], NP  Active   isosorbide mononitrate (IMDUR) 30 MG 24 hr tablet [id number] Yes Take 1 tablet (30 mg total) by mouth once daily [provider] Taking Active   losartan (COZAAR) 25 MG tablet [id number] Yes Take 1 tablet by mouth daily. [provider] Taking Active   metFORMIN (GLUCOPHAGE) 850 MG tablet [id number] Yes Take 1 tablet (850 mg total) by mouth 2 (two) times daily with a meal. Baity, [name], NP Taking Active   metoprolol tartrate (LOPRESSOR) 50 MG tablet [id number] Yes Take 1 tablet by mouth 2 (two) times daily. [provider] Taking Active   rosuvastatin (CRESTOR) 20 MG tablet [id number] Yes Take 1 tablet (20 mg total) by mouth daily. [name], NP Taking Active   Semaglutide 7 MG TABS [id number] Yes Take 7 mg by mouth daily. [provider] Taking Active             Pertinent Labs:  Lab Results  Component Value Date   HGBA1C 6.9 07/01/2022   Lab Results  Component Value Date   CHOL 136 07/01/2022   HDL 48 07/01/2022   LDLCALC 69 07/01/2022   TRIG 108 07/01/2022   CHOLHDL 2.8 07/01/2022   Lab Results  Component Value Date   NA 142 07/01/2022   CL 105 07/01/2022   K 4.7 07/01/2022  CO2 28 07/01/2022   BUN 16 07/01/2022   CREATININE 1.08 07/01/2022   EGFR 76 07/01/2022   CALCIUM 10.3 07/01/2022   PHOS 2.8 03/30/2020   ALBUMIN 4.3 03/30/2020   GLUCOSE 90 07/01/2022   BP Readings from Last 3 Encounters:  07/01/22 134/62  03/27/22 118/70  01/07/22 (!) 146/80   Pulse Readings from Last 3 Encounters:  07/01/22 85  03/27/22 77  01/07/22 89     SDOH:  (Social Determinants of Health) assessments and interventions performed:  SDOH Interventions    Flowsheet Row Office Visit from 07/01/2022 in [facility] Office Visit from 03/27/2022 in [facility] Office Visit from  01/07/2022 in [facility] Chronic Care Management from 01/01/2022 in [facility] Office Visit from 12/09/2021 in [facility] Clinical Support from 08/29/2021 in [facility]  SDOH Interventions        Food Insecurity Interventions -- -- -- Intervention Not Indicated -- Intervention Not Indicated  Housing Interventions -- -- -- Intervention Not Indicated -- Intervention Not Indicated  Transportation Interventions -- -- -- Intervention Not Indicated -- Intervention Not Indicated  Utilities Interventions -- -- -- Intervention Not Indicated -- --  Alcohol Usage Interventions -- -- -- Intervention Not Indicated (Score <7) -- --  Depression Interventions/Treatment  Medication Patient refuses Treatment Patient refuses Treatment -- Patient refuses Treatment PHQ2-9 Score <4 Follow-up Not Indicated  Financial Strain Interventions -- -- -- Other (Comment)  [needs assistance with paying for Daniel Bradley consult pending] -- Intervention Not Indicated  Physical Activity Interventions -- -- -- Intervention Not Indicated, Other (Comments)  [goes to the gym, 3 or 4 days a week, does walking, bench presses, weights, and knee presses] -- Intervention Not Indicated  Stress Interventions -- -- -- Intervention Not Indicated -- Intervention Not Indicated  Social Connections Interventions -- -- -- Intervention Not Indicated, Other (Comment)  [lives with his girlfriend April, the patient has been divorced 3 times, good support system, has 2 sons] -- Intervention Not Indicated       CCM Care Plan  Review of patient past medical history, allergies, medications, health status, including review of consultants reports, laboratory and other test data, was performed as part of comprehensive evaluation and provision of chronic care management services.   Care Plan : General Pharmacy (Adult)  Updates made by [name], RPH-CPP since 09/01/2022 12:00 AM     Problem: Disease Progression      Long-Range Goal: Disease Progression Prevented or Minimized   Start Date: 01/10/2022  Expected End Date: 04/10/2022  Recent Progress: On track  Priority: High  Note:   Current Barriers:  Unable to independently afford treatment regimen Patient approved for patient assistance for Rybelsus from [employer] through 02/10/2023 Unable to achieve control of A1C Unable to achieve control of blood pressure   Pharmacist Clinical Goal(s):  patient will verbalize ability to afford treatment regimen achieve control of T2DM as evidenced by A1C  through collaboration with PharmD and provider achieve control of HTN as evidenced by blood pressure through collaboration with PharmD and provider.    Interventions: 1:1 collaboration with [name], NP regarding development and update of comprehensive plan of care as evidenced by provider attestation and co-signature Inter-disciplinary care team collaboration (see longitudinal plan of care) Reports takes his medications directly from pill bottles and has a routine to aid with  adherence; denies missed doses Perform chart review. Patient seen for Office Visit with [facility] Cardiology on 6/5. Provider advised patient: - Start Imdur 30 mg once daily - Start losartan 25 mg once daily Patient seen for follow up with Cardiology again on 7/10 and provider prescribed increase dose of metoprolol 50 mg twice daily   Diabetes:  Controlled based on latest A1C; current treatment: Glipizide 10 mg twice daily before breakfast and supper (30 minutes prior to meal) Metformin 850 mg twice daily Rybelsus 7 mg daily  Confirms taking on an empty stomach, ?30 minutes before the first food, beverage, or other oral medications of the day with ?4 oz of plain water only Medications tried in the past: Rybelsus (cost); Ozempic (cost); Evaristo Bury (cost) Recent fasting blood sugar  readings ranging 138-152 Attributes elevated readings to eating sweets (cookies, cake, honey bun, etc) at bedtime/overnight recently Denies hypoglycemic symptoms Current exercise: reports missed gym last week due to busy moving, but then plans to get back next week (~30-45 mins walks, chest press, knee press) x 2-3 days/week Statin: rosuvastatin 20 mg daily Have reviewed long term cardiovascular and renal outcomes of uncontrolled blood sugar Reviewed goal A1c, goal fasting, and goal 2 hour post prandial glucose Reports has noticed impact on his appetite/benefit of weight loss with Rybelsus Have encouraged dietary modifications including: Encouraged patient to have regular well-balance meals throughout the day (including breakfast), while controlling carbohydrate portion sizes Encouraged patient to review nutrition labels for carbohydrate content of foods Discuss ideas for balanced carbohydrate-protein snacks Recommend to continue to check glucose, keep a log of the results, have this record to review during future appointments, but to contact office sooner if needed for readings outside of established parameters or symptoms Have counseled on s/s of low blood sugar and how to treat lows Patient carries glucose tablets with him in case needed for hypoglycemia Counsel patient to follow up with [facility] mail order pharmacy today regarding refills of metformin and glipizide Counsel patient on strategies to improve comfort with blood sugar monitoring Collaborated with [facility] Noreene Larsson [place] and PCP to assist patient with applying for patient assistance for Rybelsus from [employer] currently has 2.5 month supply of Rybelsus 7 mg strength as sent by assistance program remaining  Hypertension:  Goal on Track (progressing): YES. Controlled based on latest in office reading; current treatment: amlodipine 10 mg daily metoprolol 50 mg twice daily Losartan 25 mg daily Patient has upper arm blood  pressure monitor from [facility] over the counter benefit Recent home monitoring results: 7/16: 134/69; 126/81 7/17: 119/69 7/18: 136/68 7/19: 130/76 7/20: 142/76 7/21: 126/73, HR 75 Denies hypotensive symptoms such as dizziness or lightheadedness Current exercise: reports restarted going to gym ~80-90 mins walks, chest press, knee press x 2-3 days/week Reviewed long term cardiovascular and renal outcomes of uncontrolled blood pressure Reviewed appropriate blood pressure monitoring technique and reviewed goal blood pressure Encourage patient to limit salt/sodium intake Note amlodipine not currently on patient's medication list in [facility] record. Place coordination of care call to [facility] on behalf of patient. Leave message for Dr. Juliann Pares to let provider know that patient has been taking amlodipine 10 mg daily since started by PCP on 01/07/2022. Recommended to check home blood pressure and heart rate using upper arm monitor, keep a log of the results and have this record to review during future appointments   Patient Goals/Self-Care Activities patient will:  - take medications as prescribed as evidenced by patient report and  record review - check glucose , document, and provide at future appointments - check blood pressure, document, and provide at future appointments - collaborate with provider on medication access solutions       Plan: Telephone follow up appointment with care management team member scheduled for:  11/03/2022 at 10 am  Estelle Grumbles, PharmD, [facility] Clinical Pharmacist [facility] [phone]

## 2022-09-01 NOTE — Patient Instructions (Signed)
Visit Information  Thank you for taking time to visit with me today. Please don't hesitate to contact me if I can be of assistance to you before our next scheduled telephone appointment.  Following are the goals we discussed today:   Goals Addressed             This Visit's Progress    Pharmacy Goals       Our goal A1c is less than 7%. This corresponds with fasting sugars less than 130 and 2 hour after meal sugars less than 180. Please keep a log of your results when checking your blood sugar   Our goal bad cholesterol, or LDL, is less than 70 . This is why it is important to continue taking your rosuvastatin.  Check your blood pressure twice weekly, and any time you have concerning symptoms like headache, chest pain, dizziness, shortness of breath, or vision changes.   Our goal is less than 130/80.  To appropriately check your blood pressure, make sure you do the following:  1) Avoid caffeine, exercise, or tobacco products for 30 minutes before checking. Empty your bladder. 2) Sit with your back supported in a flat-backed chair. Rest your arm on something flat (arm of the chair, table, etc). 3) Sit still with your feet flat on the floor, resting, for at least 5 minutes.  4) Check your blood pressure. Take 1-2 readings.  5) Write down these readings and bring with you to any provider appointments.  Bring your home blood pressure machine with you to a provider's office for accuracy comparison at least once a year.   Make sure you take your blood pressure medications before you come to any office visit, even if you were asked to fast for labs.    Estelle Grumbles, PharmD, BCACP Clinical Pharmacist [facility] [phone]          Our next appointment is by telephone on 11/03/2022 at 10:00 AM   Please call the care guide team at [phone] if you need to cancel or reschedule your appointment.    Patient verbalizes understanding of  instructions and care plan provided today and agrees to view in MyChart. Active MyChart status and patient understanding of how to access instructions and care plan via MyChart confirmed with patient.

## 2022-09-10 DIAGNOSIS — E785 Hyperlipidemia, unspecified: Secondary | ICD-10-CM

## 2022-09-10 DIAGNOSIS — E1169 Type 2 diabetes mellitus with other specified complication: Secondary | ICD-10-CM

## 2022-09-10 DIAGNOSIS — I10 Essential (primary) hypertension: Secondary | ICD-10-CM

## 2022-09-11 ENCOUNTER — Other Ambulatory Visit: Payer: Self-pay | Admitting: Internal Medicine

## 2022-09-11 ENCOUNTER — Ambulatory Visit (INDEPENDENT_AMBULATORY_CARE_PROVIDER_SITE_OTHER): Payer: Medicare HMO

## 2022-09-11 VITALS — Ht 73.0 in | Wt 273.0 lb

## 2022-09-11 DIAGNOSIS — Z0000 Encounter for general adult medical examination without abnormal findings: Secondary | ICD-10-CM

## 2022-09-11 NOTE — Progress Notes (Signed)
Subjective:   Daniel Bradley. is a 67 y.o. male who presents for Medicare Annual/Subsequent preventive examination.  Visit Complete: Virtual  I connected with  [name]. on 09/11/22 by a audio enabled telemedicine application and verified that I am speaking with the correct person using two identifiers.  Patient Location: Home  Provider Location: Office/Clinic  I discussed the limitations of evaluation and management by telemedicine. The patient expressed understanding and agreed to proceed.  Vital Signs: Patient was unable to self-report vital signs via telehealth due to a lack of equipment at home.   Review of Systems     Cardiac Risk Factors include: advanced age (>54men, >30 women);diabetes mellitus;dyslipidemia;male gender;obesity (BMI >30kg/m2);sedentary lifestyle     Objective:    Today's Vitals   09/11/22 1402 09/11/22 1422  Weight:  273 lb (123.8 kg)  Height:  6\' 1"  (1.854 m)  PainSc: 3     Body mass index is 36.02 kg/m.     09/11/2022    2:11 PM 10/09/2021    9:49 AM 06/23/2021    9:30 PM 01/10/2021   11:12 AM 05/23/2019    5:00 PM 05/23/2019   10:23 AM 05/13/2019    9:56 AM  Advanced Directives  Does Patient Have a Medical Advance Directive? No No No No No  No  Would patient like information on creating a medical advance directive? No - Patient declined     No - Patient declined No - Patient declined    Current Medications (verified) Outpatient Encounter Medications as of 09/11/2022  Medication Sig   Accu-Chek Softclix Lancets lancets Use to check Blood sugar once a day.  DX: E11.9   Alcohol Swabs (B-D SINGLE USE SWABS REGULAR) PADS Use to check blood sugar once a day.  DX: E11.9   amLODipine (NORVASC) 10 MG tablet Take 1 tablet (10 mg total) by mouth daily.   aspirin EC 81 MG tablet 81 mg daily. Swallow whole.   blood glucose meter kit and supplies Dispense based on patient and insurance preference. Use to check blood sugar daily as directed. DX  Ell.9   Cholecalciferol 125 MCG (5000 UT) capsule Take 5,000 Units by mouth daily.   doxylamine, Sleep, (UNISOM) 25 MG tablet Take 25 mg by mouth at bedtime as needed.   glipiZIDE (GLUCOTROL) 10 MG tablet Take 1 tablet (10 mg total) by mouth 2 (two) times daily before a meal.   glucose blood (ACCU-CHEK GUIDE) test strip Use to check blood sugar once a day.  DX: E11.9   isosorbide mononitrate (IMDUR) 30 MG 24 hr tablet Take 1 tablet (30 mg total) by mouth once daily   metFORMIN (GLUCOPHAGE) 850 MG tablet Take 1 tablet (850 mg total) by mouth 2 (two) times daily with a meal.   metoprolol tartrate (LOPRESSOR) 50 MG tablet Take 1 tablet by mouth 2 (two) times daily.   rosuvastatin (CRESTOR) 20 MG tablet Take 1 tablet (20 mg total) by mouth daily.   citalopram (CELEXA) 10 MG tablet Take 1 tablet (10 mg total) by mouth daily. (Patient not taking: Reported on 09/11/2022)   losartan (COZAAR) 25 MG tablet Take 1 tablet by mouth daily. (Patient not taking: Reported on 09/11/2022)   Semaglutide 7 MG TABS Take 7 mg by mouth daily. (Patient not taking: Reported on 09/11/2022)   No facility-administered encounter medications on file as of 09/11/2022.    Allergies (verified) Patient has no known allergies.   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   Elevated prostate specific antigen (PSA) 02/21/2013   Hemorrhoid    History of kidney stones    Hyperlipidemia    Hypertension    Incomplete bladder emptying 01/10/2013   Malignant neoplasm of prostate (HCC) 04/28/2013   prostate removed   Myocardial infarction [facility]) 2019   2 stents placed   Obstructive sleep apnea on CPAP    does not use cpap, does not snore much since loosing 80 lbs   Prostate cancer [facility])    Past Surgical  History:  Procedure Laterality Date   COLONOSCOPY     COLONOSCOPY WITH PROPOFOL N/A 10/09/2021   Procedure: COLONOSCOPY WITH PROPOFOL;  Surgeon: Toney Reil, MD;  Location: [facility] ENDOSCOPY;  Service: Gastroenterology;  Laterality: N/A;   CORONARY STENT INTERVENTION N/A 08/20/2017   Procedure: CORONARY STENT INTERVENTION;  Surgeon: Alwyn Pea, MD;  Location: [facility] INVASIVE CV LAB;  Service: Cardiovascular;  Laterality: N/A;   dental implants     DG KNEE RIGHT COMPLETE ([facility] HX) Right 05/23/2019   KNEE ARTHROPLASTY Left 05/23/2019   Procedure: COMPUTER ASSISTED TOTAL KNEE ARTHROPLASTY;  Surgeon: Donato Heinz, MD;  Location: [facility] ORS;  Service: Orthopedics;  Laterality: Left;   LEFT HEART CATH AND CORONARY ANGIOGRAPHY N/A 08/20/2017   Procedure: LEFT HEART CATH AND CORONARY ANGIOGRAPHY;  Surgeon: Dalia Heading, MD;  Location: [facility] INVASIVE CV LAB;  Service: Cardiovascular;  Laterality: N/A;   PROSTATE SURGERY  07/14/2013   Prostatectomy   Family History  Problem Relation Age of Onset   Alcoholism Mother    Alcoholism Father    Pancreatic cancer Brother    Prostate cancer Neg Hx    Kidney disease Neg Hx    Cancer - Colon Neg Hx    Social History   Socioeconomic History   Marital status: Single    Spouse name: Not on file   Number of children: 2   Years of education: Not on file   Highest education level: Not on file  Occupational History   Occupation: [employer]AT[employer]    Employer: [employer]    Comment: retired  Tobacco Use   Smoking status: Never   Smokeless tobacco: Never  Vaping Use   Vaping status: Never Used  Substance and Sexual Activity   Alcohol use: No    Alcohol/week: 0.0 standard drinks of alcohol    Comment: rare   Drug use: No   Sexual activity: Not Currently  Other Topics Concern   Not on file  Social History Narrative   Lives with girlfriend, April   Social Determinants of Health   Financial Resource Strain: Low Risk  (09/11/2022)    Overall Financial Resource Strain (CARDIA)    Difficulty of Paying Living Expenses: Not hard at all  Food Insecurity: No Food Insecurity (09/11/2022)   Hunger Vital Sign    Worried About Running Out of Food in the Last Year: Never true    Ran Out of Food in the Last Year: Never true  Transportation Needs: No Transportation Needs (09/11/2022)   PRAPARE - Administrator, Civil Service (Medical): No    Lack of Transportation (Non-Medical): No  Physical Activity: Inactive (09/11/2022)   Exercise Vital Sign    Days of Exercise per Week: 0  days    Minutes of Exercise per Session: 0 min  Stress: Stress Concern Present (09/11/2022)   [employer] of Occupational Health - Occupational Stress Questionnaire    Feeling of Stress : To some extent  Social Connections: Socially Isolated (09/11/2022)   Social Connection and Isolation Panel [NHANES]    Frequency of Communication with Friends and Family: Twice a week    Frequency of Social Gatherings with Friends and Family: Once a week    Attends Religious Services: Never    Database administrator or Organizations: No    Attends Engineer, structural: Never    Marital Status: Divorced    Tobacco Counseling Counseling given: Not Answered   Clinical Intake:  Pre-visit preparation completed: Yes  Pain : 0-10 Pain Score: 3  Pain Type: Chronic pain Pain Location: Back Pain Orientation: Lower     Nutritional Risks: None Diabetes: Yes CBG done?: No Did pt. bring in CBG monitor from home?: No  How often do you need to have someone help you when you read instructions, pamphlets, or other written materials from your doctor or pharmacy?: 1 - Never  Interpreter Needed?: No  Information entered by :: Kennedy Bucker, LPN   Activities of Daily Living    09/11/2022    2:12 PM 07/01/2022    3:45 PM  In your present state of health, do you have any difficulty performing the following activities:  Hearing? 0 0  Vision? 0 0   Difficulty concentrating or making decisions? 0 1  Walking or climbing stairs? 0 0  Dressing or bathing? 0 0  Doing errands, shopping? 0 0  Preparing Food and eating ? N   Using the Toilet? N   In the past six months, have you accidently leaked urine? N   Do you have problems with loss of bowel control? N   Managing your Medications? N   Managing your Finances? N   Housekeeping or managing your Housekeeping? N     Patient Care Team: [name], NP as PCP - General (Internal Medicine) Marlowe Sax, RN as Case Manager (General Practice) Ronney Asters, Jackelyn Poling, RPH-CPP as Pharmacist  Indicate any recent Medical Services you may have received from other than [facility] providers in the past year (date may be approximate).     Assessment:   This is a routine wellness examination for Joeph.  Hearing/Vision screen Hearing Screening - Comments:: No aids Vision Screening - Comments:: Wears glasses- Dr.Woodard  Dietary issues and exercise activities discussed:     Goals Addressed             This Visit's Progress    DIET - EAT MORE FRUITS AND VEGETABLES         Depression Screen    09/11/2022    2:07 PM 07/01/2022    3:44 PM 03/27/2022    8:35 AM 01/07/2022    9:46 AM 12/09/2021    9:04 AM 08/29/2021    1:25 PM 03/19/2021   11:19 AM  PHQ 2/9 Scores  PHQ - 2 Score 2 6 2 4 3 4 4   PHQ- 9 Score 4 20 4 8 6  12     Fall Risk    09/11/2022    2:12 PM 07/01/2022    3:45 PM 03/27/2022    8:36 AM 01/07/2022    9:47 AM 01/01/2022    1:08 PM  Fall Risk   Falls in the past year? 0 0 0 0 0  Number falls  in past yr: 0    0  Injury with Fall? 0 0 0 0 0  Risk for fall due to : No Fall Risks No Fall Risks No Fall Risks  No Fall Risks  Follow up Falls prevention discussed;Falls evaluation completed    Falls evaluation completed    MEDICARE RISK AT HOME:  Medicare Risk at Home - 09/11/22 1413     Any stairs in or around the home? Yes    If so, are there any without handrails? No     Home free of loose throw rugs in walkways, pet beds, electrical cords, etc? Yes    Adequate lighting in your home to reduce risk of falls? Yes    Life alert? No    Use of a cane, walker or w/c? No    Grab bars in the bathroom? No    Shower chair or bench in shower? No    Elevated toilet seat or a handicapped toilet? No             TIMED UP AND GO:  Was the test performed?  No    Cognitive Function:        09/11/2022    2:17 PM 08/29/2021    1:32 PM  6CIT Screen  What Year? 0 points 0 points  What month? 0 points 0 points  What time? 0 points 0 points  Count back from 20 0 points 0 points  Months in reverse 0 points 0 points  Repeat phrase 2 points 10 points  Total Score 2 points 10 points    Immunizations Immunization History  Administered Date(s) Administered   Influenza,inj,Quad PF,6+ Mos 11/17/2018, 12/30/2019   Influenza-Unspecified 11/25/2011, 12/07/2014, 12/17/2020, 10/25/2021   Moderna Sars-Covid-2 Vaccination 02/16/2020   PFIZER(Purple Top)SARS-COV-2 Vaccination 07/21/2019, 08/11/2019   PNEUMOCOCCAL CONJUGATE-20 12/17/2020   Tdap 02/10/2010    TDAP status: Due, Education has been provided regarding the importance of this vaccine. Advised may receive this vaccine at local pharmacy or Health Dept. Aware to provide a copy of the vaccination record if obtained from local pharmacy or Health Dept. Verbalized acceptance and understanding.  Flu Vaccine status: Up to date  Pneumococcal vaccine status: Up to date  Covid-19 vaccine status: Completed vaccines  Qualifies for Shingles Vaccine? Yes   Zostavax completed No   Shingrix Completed?: No.    Education has been provided regarding the importance of this vaccine. Patient has been advised to call insurance company to determine out of pocket expense if they have not yet received this vaccine. Advised may also receive vaccine at local pharmacy or Health Dept. Verbalized acceptance and understanding.  Screening  Tests Health Maintenance  Topic Date Due   Zoster Vaccines- Shingrix (1 of 2) Never done   DTaP/Tdap/Td (2 - Td or Tdap) 02/11/2020   INFLUENZA VACCINE  09/11/2022   OPHTHALMOLOGY EXAM  11/07/2022   HEMOGLOBIN A1C  01/01/2023   Diabetic kidney evaluation - Urine ACR  01/08/2023   FOOT EXAM  01/08/2023   Diabetic kidney evaluation - eGFR measurement  07/01/2023   Medicare Annual Wellness (AWV)  09/11/2023   Colonoscopy  10/10/2031   Pneumonia Vaccine 76+ Years old  Completed   Hepatitis C Screening  Completed   HPV VACCINES  Aged Out   COVID-19 Vaccine  Discontinued    Health Maintenance  Health Maintenance Due  Topic Date Due   Zoster Vaccines- Shingrix (1 of 2) Never done   DTaP/Tdap/Td (2 - Td or Tdap) 02/11/2020   INFLUENZA  VACCINE  09/11/2022    Colorectal cancer screening: Type of screening: Colonoscopy. Completed 10/09/21. Repeat every 10 years  Lung Cancer Screening: (Low Dose CT Chest recommended if Age 38-80 years, 20 pack-year currently smoking OR have quit w/in 15years.) does not qualify.   Additional Screening:  Hepatitis C Screening: does qualify; Completed 01/07/22  Vision Screening: Recommended annual ophthalmology exams for early detection of glaucoma and other disorders of the eye. Is the patient up to date with their annual eye exam?  Yes  Who is the provider or what is the name of the office in which the patient attends annual eye exams? Dr.Woodard If pt is not established with a provider, would they like to be referred to a provider to establish care? No .   Dental Screening: Recommended annual dental exams for proper oral hygiene  Diabetic Foot Exam: Diabetic Foot Exam: Completed 01/07/22  Community Resource Referral / Chronic Care Management: CRR required this visit?  No   CCM required this visit?  No     Plan:     I have personally reviewed and noted the following in the patient's chart:   Medical and social history Use of alcohol,  tobacco or illicit drugs  Current medications and supplements including opioid prescriptions. Patient is not currently taking opioid prescriptions. Functional ability and status Nutritional status Physical activity Advanced directives List of other physicians Hospitalizations, surgeries, and ER visits in previous 12 months Vitals Screenings to include cognitive, depression, and falls Referrals and appointments  In addition, I have reviewed and discussed with patient certain preventive protocols, quality metrics, and best practice recommendations. A written personalized care plan for preventive services as well as general preventive health recommendations were provided to patient.     Hal Hope, LPN   02/15/1094   After Visit Summary: (MyChart) Due to this being a telephonic visit, the after visit summary with patients personalized plan was offered to patient via MyChart   Nurse Notes: none

## 2022-09-11 NOTE — Telephone Encounter (Signed)
Requested medication (s) are due for refill today: yes  Requested medication (s) are on the active medication list: yes  Last refill:  04/18/22 100 each  Future visit scheduled: yes  Notes to clinic:  pads not assigned to a protocol NT   Requested Prescriptions  Pending Prescriptions Disp Refills   Alcohol Swabs (DROPSAFE ALCOHOL PREP) 70 % PADS [Pharmacy Med Name: DROPSAFE ALCOHOL PREP PADS 70 % Pad] 100 each 3    Sig: USE AS DIRECTED TO CHECK BLOOD SUGAR ONCE A DAY     Off-Protocol Failed - 09/11/2022  2:08 AM      Failed - Medication not assigned to a protocol, review manually.      Passed - Valid encounter within last 12 months    Recent Outpatient Visits           2 months ago Class 2 severe obesity due to excess calories with serious comorbidity and body mass index (BMI) of 36.0 to 36.9 in adult [facility])   [place] [facility] [place], [place], NP   5 months ago Type 2 diabetes mellitus without complication, without long-term current use of insulin [facility])   [place] [facility] [place], [name], NP   8 months ago Encounter for general adult medical examination with abnormal findings   [place] [facility] [place], [place], NP   9 months ago Type 2 diabetes mellitus with other specified complication, without long-term current use of insulin [facility])   [place] [facility] [place], [name], NP   11 months ago Essential hypertension   [place] [facility] [place], [name], NP       Future Appointments             In 2 months Vanna [place], MD [facility] [place]   In 4 months [place], [name], NP [place] [facility], [place] [facility]

## 2022-09-11 NOTE — Patient Instructions (Addendum)
Daniel Bradley , Thank you for taking time to come for your Medicare Wellness Visit. I appreciate your ongoing commitment to your health goals. Please review the following plan we discussed and let me know if I can assist you in the future.   Referrals/Orders/Follow-Ups/Clinician Recommendations: none  This is a list of the screening recommended for you and due dates:  Health Maintenance  Topic Date Due   Zoster (Shingles) Vaccine (1 of 2) Never done   DTaP/Tdap/Td vaccine (2 - Td or Tdap) 02/11/2020   Flu Shot  09/11/2022   Eye exam for diabetics  11/07/2022   Hemoglobin A1C  01/01/2023   Yearly kidney health urinalysis for diabetes  01/08/2023   Complete foot exam   01/08/2023   Yearly kidney function blood test for diabetes  07/01/2023   Medicare Annual Wellness Visit  09/11/2023   Colon Cancer Screening  10/10/2031   Pneumonia Vaccine  Completed   Hepatitis C Screening  Completed   HPV Vaccine  Aged Out   COVID-19 Vaccine  Discontinued    Advanced directives: (Declined) Advance directive discussed with you today. Even though you declined this today, please call our office should you change your mind, and we can give you the proper paperwork for you to fill out.  Next Medicare Annual Wellness Visit scheduled for next year: Yes   09/17/23 @ 2:00 pm by phone  Preventive Care 65 Years and Older, Male  Preventive care refers to lifestyle choices and visits with your health care provider that can promote health and wellness. What does preventive care include? A yearly physical exam. This is also called an annual well check. Dental exams once or twice a year. Routine eye exams. Ask your health care provider how often you should have your eyes checked. Personal lifestyle choices, including: Daily care of your teeth and gums. Regular physical activity. Eating a healthy diet. Avoiding tobacco and drug use. Limiting alcohol use. Practicing safe sex. Taking low doses of aspirin every  day. Taking vitamin and mineral supplements as recommended by your health care provider. What happens during an annual well check? The services and screenings done by your health care provider during your annual well check will depend on your age, overall health, lifestyle risk factors, and family history of disease. Counseling  Your health care provider may ask you questions about your: Alcohol use. Tobacco use. Drug use. Emotional well-being. Home and relationship well-being. Sexual activity. Eating habits. History of falls. Memory and ability to understand (cognition). Work and work Astronomer. Screening  You may have the following tests or measurements: Height, weight, and BMI. Blood pressure. Lipid and cholesterol levels. These may be checked every 5 years, or more frequently if you are over 18 years old. Skin check. Lung cancer screening. You may have this screening every year starting at age 60 if you have a 30-pack-year history of smoking and currently smoke or have quit within the past 15 years. Fecal occult blood test (FOBT) of the stool. You may have this test every year starting at age 39. Flexible sigmoidoscopy or colonoscopy. You may have a sigmoidoscopy every 5 years or a colonoscopy every 10 years starting at age 61. Prostate cancer screening. Recommendations will vary depending on your family history and other risks. Hepatitis C blood test. Hepatitis B blood test. Sexually transmitted disease (STD) testing. Diabetes screening. This is done by checking your blood sugar (glucose) after you have not eaten for a while (fasting). You may have this done every 1-3  years. Abdominal aortic aneurysm (AAA) screening. You may need this if you are a current or former smoker. Osteoporosis. You may be screened starting at age 57 if you are at high risk. Talk with your health care provider about your test results, treatment options, and if necessary, the need for more  tests. Vaccines  Your health care provider may recommend certain vaccines, such as: Influenza vaccine. This is recommended every year. Tetanus, diphtheria, and acellular pertussis (Tdap, Td) vaccine. You may need a Td booster every 10 years. Zoster vaccine. You may need this after age 80. Pneumococcal 13-valent conjugate (PCV13) vaccine. One dose is recommended after age 62. Pneumococcal polysaccharide (PPSV23) vaccine. One dose is recommended after age 32. Talk to your health care provider about which screenings and vaccines you need and how often you need them. This information is not intended to replace advice given to you by your health care provider. Make sure you discuss any questions you have with your health care provider. Document Released: 02/23/2015 Document Revised: 10/17/2015 Document Reviewed: 11/28/2014 Elsevier Interactive Patient Education  2017 [employer].  Fall Prevention in the Home Falls can cause injuries. They can happen to people of all ages. There are many things you can do to make your home safe and to help prevent falls. What can I do on the outside of my home? Regularly fix the edges of walkways and driveways and fix any cracks. Remove anything that might make you trip as you walk through a door, such as a raised step or threshold. Trim any bushes or trees on the path to your home. Use bright outdoor lighting. Clear any walking paths of anything that might make someone trip, such as rocks or tools. Regularly check to see if handrails are loose or broken. Make sure that both sides of any steps have handrails. Any raised decks and porches should have guardrails on the edges. Have any leaves, snow, or ice cleared regularly. Use sand or salt on walking paths during winter. Clean up any spills in your garage right away. This includes oil or grease spills. What can I do in the bathroom? Use night lights. Install grab bars by the toilet and in the tub and shower.  Do not use towel bars as grab bars. Use non-skid mats or decals in the tub or shower. If you need to sit down in the shower, use a plastic, non-slip stool. Keep the floor dry. Clean up any water that spills on the floor as soon as it happens. Remove soap buildup in the tub or shower regularly. Attach bath mats securely with double-sided non-slip rug tape. Do not have throw rugs and other things on the floor that can make you trip. What can I do in the bedroom? Use night lights. Make sure that you have a light by your bed that is easy to reach. Do not use any sheets or blankets that are too big for your bed. They should not hang down onto the floor. Have a firm chair that has side arms. You can use this for support while you get dressed. Do not have throw rugs and other things on the floor that can make you trip. What can I do in the kitchen? Clean up any spills right away. Avoid walking on wet floors. Keep items that you use a lot in easy-to-reach places. If you need to reach something above you, use a strong step stool that has a grab bar. Keep electrical cords out of the way. Do  not use floor polish or wax that makes floors slippery. If you must use wax, use non-skid floor wax. Do not have throw rugs and other things on the floor that can make you trip. What can I do with my stairs? Do not leave any items on the stairs. Make sure that there are handrails on both sides of the stairs and use them. Fix handrails that are broken or loose. Make sure that handrails are as long as the stairways. Check any carpeting to make sure that it is firmly attached to the stairs. Fix any carpet that is loose or worn. Avoid having throw rugs at the top or bottom of the stairs. If you do have throw rugs, attach them to the floor with carpet tape. Make sure that you have a light switch at the top of the stairs and the bottom of the stairs. If you do not have them, ask someone to add them for you. What else  can I do to help prevent falls? Wear shoes that: Do not have high heels. Have rubber bottoms. Are comfortable and fit you well. Are closed at the toe. Do not wear sandals. If you use a stepladder: Make sure that it is fully opened. Do not climb a closed stepladder. Make sure that both sides of the stepladder are locked into place. Ask someone to hold it for you, if possible. Clearly mark and make sure that you can see: Any grab bars or handrails. First and last steps. Where the edge of each step is. Use tools that help you move around (mobility aids) if they are needed. These include: Canes. Walkers. Scooters. Crutches. Turn on the lights when you go into a dark area. Replace any light bulbs as soon as they burn out. Set up your furniture so you have a clear path. Avoid moving your furniture around. If any of your floors are uneven, fix them. If there are any pets around you, be aware of where they are. Review your medicines with your doctor. Some medicines can make you feel dizzy. This can increase your chance of falling. Ask your doctor what other things that you can do to help prevent falls. This information is not intended to replace advice given to you by your health care provider. Make sure you discuss any questions you have with your health care provider. Document Released: 11/23/2008 Document Revised: 07/05/2015 Document Reviewed: 03/03/2014 Elsevier Interactive Patient Education  2017 [employer].

## 2022-09-16 ENCOUNTER — Telehealth: Payer: Self-pay

## 2022-09-16 ENCOUNTER — Other Ambulatory Visit: Payer: Medicare HMO

## 2022-09-16 ENCOUNTER — Telehealth: Payer: Medicare HMO

## 2022-09-16 NOTE — Patient Outreach (Signed)
  Care Management   Follow Up Note   09/16/2022 Name: Daniel Bradley. MRN: [id number] DOB: [birth year]   Referred by: [name], NP Reason for referral : Care Management (RNCM: Follow up for Chronic Disease Management and Care Coordination Needs- attempt)   An unsuccessful telephone outreach was attempted today. The patient was referred to the case management team for assistance with care management and care coordination.   Follow Up Plan: The care management team will reach out to the patient again over the next 30 days.   Alto Denver RN, MSN, CCM RN Care Manager  [facility]  Ambulatory Care Management  Direct Number: [phone]

## 2022-10-05 ENCOUNTER — Other Ambulatory Visit: Payer: Self-pay | Admitting: Internal Medicine

## 2022-10-07 NOTE — Telephone Encounter (Signed)
Requested Prescriptions  Refused Prescriptions Disp Refills   metoprolol tartrate (LOPRESSOR) 25 MG tablet [Pharmacy Med Name: METOPROLOL TARTRATE 25MG  TABLETS] 180 tablet 0    Sig: TAKE 1 TABLET(25 MG) BY MOUTH TWICE DAILY     Cardiovascular:  Beta Blockers Passed - 10/05/2022  3:19 PM      Passed - Last BP in normal range    BP Readings from Last 1 Encounters:  07/01/22 134/62         Passed - Last Heart Rate in normal range    Pulse Readings from Last 1 Encounters:  07/01/22 85         Passed - Valid encounter within last 6 months    Recent Outpatient Visits           3 months ago Class 2 severe obesity due to excess calories with serious comorbidity and body mass index (BMI) of 36.0 to 36.9 in adult [facility])   [place] [facility] [place], [place] W, NP   6 months ago Type 2 diabetes mellitus without complication, without long-term current use of insulin [facility])   [place] [facility] [place], [name], NP   9 months ago Encounter for general adult medical examination with abnormal findings   [place] [facility] [place], [place] W, NP   10 months ago Type 2 diabetes mellitus with other specified complication, without long-term current use of insulin [facility])   [place] [facility] [place], [name], NP   11 months ago Essential hypertension   [place] [facility] [place], [name], NP       Future Appointments             In 1 month Vanna [place], MD [facility] [place]   In 3 months [place], [name], NP [place] [facility]

## 2022-10-08 ENCOUNTER — Other Ambulatory Visit: Payer: Self-pay

## 2022-10-08 MED ORDER — ACCU-CHEK GUIDE W/DEVICE KIT: PACK | 0 refills | Status: AC

## 2022-10-15 ENCOUNTER — Other Ambulatory Visit: Payer: Self-pay

## 2022-10-15 ENCOUNTER — Other Ambulatory Visit: Payer: Medicare HMO

## 2022-10-15 NOTE — Patient Outreach (Signed)
Care Management   Visit Note  10/15/2022 Name: Daniel Bradley. MRN: [id number] DOB: [birth year]  Subjective: Daniel Bradley. is a 67 y.o. year old male who is a primary care patient of [name], NP. The Care Management team was consulted for assistance.      Engaged with patient spoke with patient by telephone.    Goals Addressed             This Visit's Progress    RNCM Care Management Expected Outcome:  Monitor, Self-Manage and Reduce Symptoms of Diabetes       Current Barriers:  Knowledge Deficits related to the significance of controlling blood sugars and keeping A1C levels down Care Coordination needs related to medication cost constraints  in a patient with DM Chronic Disease Management support and education needs related to effective management of DM Financial Constraints.  Lab Results  Component Value Date   HGBA1C 6.9 07/01/2022     Planned Interventions: Provided education to patient about basic DM disease process. Review and education provided. The patient is doing well and has improved his A1C level to at goal. Praised the patient for doing well with getting his numbers down. Continues to work with pharm D. Saw the pcp in May. The patient has made some changes and now is in his own apartment. The patient has adjust to the move and states that he has lost his motivation but he is trying to get motivated again. Reflective listening and support given.  Reviewed medications with patient and discussed importance of medication adherence. The patient is compliant with medications. The patient has been approved for Rybelsus and is thankful for this. This is a medication that helped him a lot in the past. Review of the notes and the patient will be able to pick up the medications at the office when they are in. The patient continues to work with pharm D on a regular basis. Will follow up with pharm D later this month. Education and support given. ;        Reviewed  prescribed diet with patient heart healthy/ADA diet.  He admits sweets are his weak points. Education provided on eating balanced meals and monitoring for abnormal readings. The patient is being more mindful of his eating habits. States he is eating better and he is watching what he eats. Encouraged the patient to continue with good eating habits and not to get hooked on junk foods and fast foods now that he is living on his own. Education and support provided.  Counseled on importance of regular laboratory monitoring as prescribed. Has regular lab work done;        Discussed plans with patient for ongoing care management follow up and provided patient with direct contact information for care management team;      Provided patient with written educational materials related to hypo and hyperglycemia and importance of correct treatment.  The patient denies any sx and sx of hypo or hyperglycemia. Denies any sx and sx of hypo or hyperglycemia. Review with the patient. The patient is waiting on a new meter as his other one will not register his readings.  Reviewed scheduled/upcoming provider appointments including:  01-12-2023 at 840 am Advised patient, providing education and rationale, to check cbg twice daily and when you have symptoms of low or high blood sugar and record. The patient is taking his blood sugars. Review of the goal of fasting is <130 and post prandial of <180.  The patient has  been having issues with his meter. The patient states that he will stick his finger but it is not picking it up. He has called [employer] and they are sending him a new meter. He feels like he is doing well and will have readings when he gets his new meter.  Call provider for findings outside established parameters;       Referral made to pharmacy team for assistance with assistance with cost constraints related to Rybelsus and inability to pay for refills. The patient is working with the pharm D for medication  cost constraints. The patient has been approved for Rybelsus and will be covered through 2024. Continues to work with the pharm D on a regular basis Review of patient status, including review of consultants reports, relevant laboratory and other test results, and medications completed;       Advised patient to discuss changes in DM health and well being with provider;      Screening for signs and symptoms of depression related to chronic disease state;        Assessed social determinant of health barriers;         Symptom Management: Take medications as prescribed   Attend all scheduled provider appointments Call provider office for new concerns or questions  call the Suicide and Crisis Lifeline: 988 call the [place] National Suicide Prevention Lifeline: 825 113 6020 or TTY: 779 321 6414 TTY (530) 265-9889) to talk to a trained counselor call 1-800-273-TALK (toll free, 24 hour hotline) if experiencing a Mental Health or Behavioral Health Crisis  check feet daily for cuts, sores or redness trim toenails straight across manage portion size wash and dry feet carefully every day wear comfortable, cotton socks wear comfortable, well-fitting shoes  Follow Up Plan: Telephone follow up appointment with care management team member scheduled for: [id number] at 100 pm       RNCM Care Management Expected Outcome:  Monitor, Self-Manage and Reduce Symptoms of: HLD       Current Barriers:  Chronic Disease Management support and education needs related to effective management of HLD BP Readings from Last 3 Encounters:  07/01/22 134/62  03/27/22 118/70  01/07/22 (!) 146/80    Lab Results  Component Value Date   CHOL 136 07/01/2022   CHOL 150 01/07/2022   CHOL 229 (H) 10/15/2021   Lab Results  Component Value Date   HDL 49 01/07/2022   HDL 49 10/15/2021   HDL 51 06/20/2021   Lab Results  Component Value Date   LDLCALC 69 07/01/2022   LDLCALC 83 01/07/2022   LDLCALC 152 (H) 10/15/2021    Lab Results  Component Value Date   TRIG 86 01/07/2022   TRIG 153 (H) 10/15/2021   TRIG 177 (H) 06/20/2021   Lab Results  Component Value Date   CHOLHDL 2.8 07/01/2022   CHOLHDL 3.1 01/07/2022   CHOLHDL 4.7 10/15/2021     Planned Interventions: Provider established cholesterol goals reviewed.Review of goals. Review of goals and the patient is at goal currently. Review of making sure he is taking his medications and following the plan of care established by the provider. The patient is doing well with staying at goal with cholesterol readings. He also is having more stable blood pressures. Education and support given.  ; Counseled on importance of regular laboratory monitoring as prescribed. Has lab work done on a regular basis. Most recent in May. Provided HLD educational materials. Education and support given; Reviewed role and benefits of statin for ASCVD risk reduction; The  patient has not been taking his blood pressures recently due to moving into an apartment. Education and support given.  Discussed strategies to manage statin-induced myalgias; Reviewed importance of limiting foods high in cholesterol. Review of heart healthy/ADA diet-He is trying to be more mindful of foods that are not good for him. Sweets is his weakness, is doing better with managing his dietary restrictions. Will continue to monitor for changes; Reviewed exercise goals and target of 150 minutes per week. Patient goes to the gym and works out on a regular basis. Usually works out one hour and 15 minutes. He states he feels a lot better when he goes to the gym regularly. He says that lately has not been motivated and has not been going as much. He has been a little down due to having to move out of his house. He wanted to keep his house. Allowed the patient to express his thoughts and feelings and talk about things going on. Encouraged the patient to set small goals and work toward those goals. The patient states  that he is going to work on getting into a better routine. Education and support given; Screening for signs and symptoms of depression related to chronic disease state;  Assessed social determinant of health barriers;   Symptom Management: Take medications as prescribed   Attend all scheduled provider appointments Call provider office for new concerns or questions  call the Suicide and Crisis Lifeline: 988 call the [place] National Suicide Prevention Lifeline: 901-774-6426 or TTY: (343) 298-3713 TTY 458-602-5091) to talk to a trained counselor call 1-800-273-TALK (toll free, 24 hour hotline) if experiencing a Mental Health or Behavioral Health Crisis  - call for medicine refill 2 or 3 days before it runs out - take all medications exactly as prescribed - call doctor with any symptoms you believe are related to your medicine - call doctor when you experience any new symptoms - go to all doctor appointments as scheduled - adhere to prescribed diet: heart healthy/ADA diet   Follow Up Plan: Telephone follow up appointment with care management team member scheduled for: 12-31-2022 at 100 pm            Consent to Services:  Patient was given information about care management services, agreed to services, and gave verbal consent to participate.   Plan: Telephone follow up appointment with care management team member scheduled for: 12-31-2022 at 1 pm Alto Denver RN, MSN, CCM RN Care Manager  [facility]  Ambulatory Care Management  Direct Number: [phone]

## 2022-10-15 NOTE — Patient Instructions (Signed)
Visit Information  Thank you for taking time to visit with me today. Please don't hesitate to contact me if I can be of assistance to you before our next scheduled telephone appointment.  Following are the goals we discussed today:   Goals Addressed             This Visit's Progress    RNCM Care Management Expected Outcome:  Monitor, Self-Manage and Reduce Symptoms of Diabetes       Current Barriers:  Knowledge Deficits related to the significance of controlling blood sugars and keeping A1C levels down Care Coordination needs related to medication cost constraints  in a patient with DM Chronic Disease Management support and education needs related to effective management of DM Financial Constraints.  Lab Results  Component Value Date   HGBA1C 6.9 07/01/2022     Planned Interventions: Provided education to patient about basic DM disease process. Review and education provided. The patient is doing well and has improved his A1C level to at goal. Praised the patient for doing well with getting his numbers down. Continues to work with pharm D. Saw the pcp in May. The patient has made some changes and now is in his own apartment. The patient has adjust to the move and states that he has lost his motivation but he is trying to get motivated again. Reflective listening and support given.  Reviewed medications with patient and discussed importance of medication adherence. The patient is compliant with medications. The patient has been approved for Rybelsus and is thankful for this. This is a medication that helped him a lot in the past. Review of the notes and the patient will be able to pick up the medications at the office when they are in. The patient continues to work with pharm D on a regular basis. Will follow up with pharm D later this month. Education and support given. ;        Reviewed prescribed diet with patient heart healthy/ADA diet.  He admits sweets are his weak points. Education  provided on eating balanced meals and monitoring for abnormal readings. The patient is being more mindful of his eating habits. States he is eating better and he is watching what he eats. Encouraged the patient to continue with good eating habits and not to get hooked on junk foods and fast foods now that he is living on his own. Education and support provided.  Counseled on importance of regular laboratory monitoring as prescribed. Has regular lab work done;        Discussed plans with patient for ongoing care management follow up and provided patient with direct contact information for care management team;      Provided patient with written educational materials related to hypo and hyperglycemia and importance of correct treatment.  The patient denies any sx and sx of hypo or hyperglycemia. Denies any sx and sx of hypo or hyperglycemia. Review with the patient. The patient is waiting on a new meter as his other one will not register his readings.  Reviewed scheduled/upcoming provider appointments including:  01-12-2023 at 840 am Advised patient, providing education and rationale, to check cbg twice daily and when you have symptoms of low or high blood sugar and record. The patient is taking his blood sugars. Review of the goal of fasting is <130 and post prandial of <180.  The patient has been having issues with his meter. The patient states that he will stick his finger but it is not picking  it up. He has called the insurance company and they are sending him a new meter. He feels like he is doing well and will have readings when he gets his new meter.  Call provider for findings outside established parameters;       Referral made to pharmacy team for assistance with assistance with cost constraints related to Rybelsus and inability to pay for refills. The patient is working with the pharm D for medication cost constraints. The patient has been approved for Rybelsus and will be covered through 2024.  Continues to work with the pharm D on a regular basis Review of patient status, including review of consultants reports, relevant laboratory and other test results, and medications completed;       Advised patient to discuss changes in DM health and well being with provider;      Screening for signs and symptoms of depression related to chronic disease state;        Assessed social determinant of health barriers;         Symptom Management: Take medications as prescribed   Attend all scheduled provider appointments Call provider office for new concerns or questions  call the Suicide and Crisis Lifeline: 988 call the [place] National Suicide Prevention Lifeline: 212-065-2909 or TTY: 6120652925 TTY (909) 582-4921) to talk to a trained counselor call 1-800-273-TALK (toll free, 24 hour hotline) if experiencing a Mental Health or Behavioral Health Crisis  check feet daily for cuts, sores or redness trim toenails straight across manage portion size wash and dry feet carefully every day wear comfortable, cotton socks wear comfortable, well-fitting shoes  Follow Up Plan: Telephone follow up appointment with care management team member scheduled for: [phone] at 100 pm       RNCM Care Management Expected Outcome:  Monitor, Self-Manage and Reduce Symptoms of: HLD       Current Barriers:  Chronic Disease Management support and education needs related to effective management of HLD BP Readings from Last 3 Encounters:  07/01/22 134/62  03/27/22 118/70  01/07/22 (!) 146/80    Lab Results  Component Value Date   CHOL 136 07/01/2022   CHOL 150 01/07/2022   CHOL 229 (H) 10/15/2021   Lab Results  Component Value Date   HDL 49 01/07/2022   HDL 49 10/15/2021   HDL 51 06/20/2021   Lab Results  Component Value Date   LDLCALC 69 07/01/2022   LDLCALC 83 01/07/2022   LDLCALC 152 (H) 10/15/2021   Lab Results  Component Value Date   TRIG 86 01/07/2022   TRIG 153 (H) 10/15/2021   TRIG  177 (H) 06/20/2021   Lab Results  Component Value Date   CHOLHDL 2.8 07/01/2022   CHOLHDL 3.1 01/07/2022   CHOLHDL 4.7 10/15/2021     Planned Interventions: Provider established cholesterol goals reviewed.Review of goals. Review of goals and the patient is at goal currently. Review of making sure he is taking his medications and following the plan of care established by the provider. The patient is doing well with staying at goal with cholesterol readings. He also is having more stable blood pressures. Education and support given.  ; Counseled on importance of regular laboratory monitoring as prescribed. Has lab work done on a regular basis. Most recent in May. Provided HLD educational materials. Education and support given; Reviewed role and benefits of statin for ASCVD risk reduction; The patient has not been taking his blood pressures recently due to moving into an apartment. Education and support given.  Discussed strategies to manage statin-induced myalgias; Reviewed importance of limiting foods high in cholesterol. Review of heart healthy/ADA diet-He is trying to be more mindful of foods that are not good for him. Sweets is his weakness, is doing better with managing his dietary restrictions. Will continue to monitor for changes; Reviewed exercise goals and target of 150 minutes per week. Patient goes to the gym and works out on a regular basis. Usually works out one hour and 15 minutes. He states he feels a lot better when he goes to the gym regularly. He says that lately has not been motivated and has not been going as much. He has been a little down due to having to move out of his house. He wanted to keep his house. Allowed the patient to express his thoughts and feelings and talk about things going on. Encouraged the patient to set small goals and work toward those goals. The patient states that he is going to work on getting into a better routine. Education and support given; Screening  for signs and symptoms of depression related to chronic disease state;  Assessed social determinant of health barriers;   Symptom Management: Take medications as prescribed   Attend all scheduled provider appointments Call provider office for new concerns or questions  call the Suicide and Crisis Lifeline: 988 call the [place] National Suicide Prevention Lifeline: (805) 441-2899 or TTY: 228-392-1734 TTY 442 419 0614) to talk to a trained counselor call 1-800-273-TALK (toll free, 24 hour hotline) if experiencing a Mental Health or Behavioral Health Crisis  - call for medicine refill 2 or 3 days before it runs out - take all medications exactly as prescribed - call doctor with any symptoms you believe are related to your medicine - call doctor when you experience any new symptoms - go to all doctor appointments as scheduled - adhere to prescribed diet: heart healthy/ADA diet   Follow Up Plan: Telephone follow up appointment with care management team member scheduled for: 12-31-2022 at 100 pm           Our next appointment is by telephone on 12-31-2022 at 1 pm  Please call the care guide team at [phone] if you need to cancel or reschedule your appointment.   If you are experiencing a Mental Health or Behavioral Health Crisis or need someone to talk to, please call the Suicide and Crisis Lifeline: 988 call the [place] National Suicide Prevention Lifeline: 772-208-9930 or TTY: 575 143 0360 TTY 276-259-6701) to talk to a trained counselor call 1-800-273-TALK (toll free, 24 hour hotline)   Patient verbalizes understanding of instructions and care plan provided today and agrees to view in MyChart. Active MyChart status and patient understanding of how to access instructions and care plan via MyChart confirmed with patient.     Telephone follow up appointment with care management team member scheduled for: 12-31-2022 at 1 pm  Alto Denver RN, MSN, CCM RN Care Manager  [facility]   Ambulatory Care Management  Direct Number: [phone]

## 2022-11-03 ENCOUNTER — Ambulatory Visit: Payer: Medicare HMO | Admitting: Pharmacist

## 2022-11-03 DIAGNOSIS — I10 Essential (primary) hypertension: Secondary | ICD-10-CM

## 2022-11-03 DIAGNOSIS — E1169 Type 2 diabetes mellitus with other specified complication: Secondary | ICD-10-CM

## 2022-11-03 NOTE — Progress Notes (Unsigned)
11/03/2022 Name: Daniel Bradley. MRN: [id number] DOB: 1955-09-11  Chief Complaint  Patient presents with   Medication Management    Daniel Bradley. is a 67 y.o. year old male who presented for a telephone visit.   They were referred to the pharmacist by their PCP for assistance in managing diabetes and hypertension.    Subjective:  Care Team: Primary Care Provider: [name], NP ; Next Scheduled Visit: 01/12/2023 Cardiologist: Mirian Capuchin, MD Urologist: Vanna Scotland, MD; Next Scheduled Visit: 11/18/2022 Nurse Care Manager: Marlowe Sax, RN; Next Scheduled Visit: 12/31/2022  Medication Access/Adherence  Current Pharmacy:  [facility] - [place], [place] - [address] [place] [address] Phone: [phone] Fax: [phone]  [facility] [phone] [place], [place] - [address] AT [facility] OF [address] [place] [address] Phone: [phone] Fax: [phone]   Patient reports affordability concerns with their medications: No  Patient reports access/transportation concerns to their pharmacy: No  Patient reports adherence concerns with their medications:  No    Reports takes his medications directly from pill bottles and has a routine to aid with adherence; denies missed doses   Diabetes:  Current medications:  Glipizide 10 mg twice daily before breakfast and supper (30 minutes prior to meal) Metformin 850 mg twice daily Rybelsus 7 mg daily  Confirms taking on an empty stomach, >=30 minutes before the first food, beverage, or other oral medications of the day with <=4 oz of plain water only  Medications tried in the past: Rybelsus (cost); Ozempic (cost); Evaristo Bury (cost)   Current glucose readings: recent after breakfast ranging: 90-125; last checked yesterday ~2 hours after breakfast, reading: 128  Reports recently having difficulty with his new Accu-Check Guide  meter. Reports reader not consistently giving a reading  Reports working on improving his dietary habits, including having more well-balanced meals  Current exercise: Reports getting back to going to the gym more often, currently ~30-45 mins walks, chest press, knee press x 1-2 days/week  Statin therapy: rosuvastatin 20 mg daily  Current medication access support: enrolled in patient assistance for Rybelsus from [employer] through 02/10/2023  - Reports has < 1 month supply of Rybelsus remaining  Hypertension:  Current medications:  amlodipine 10 mg daily metoprolol 50 mg twice daily Losartan 25 mg daily  Medications previously tried:   Patient has a validated, automated, upper arm home BP cuff Current blood pressure readings readings:  9/22: 125/70, HR 77 9/18: 120/77, HR 77   Patient denies hypotensive s/sx including dizziness, lightheadedness.   Current meal patterns: admits to occasionally adding salt to his food, but trying to limit  Current exercise: Reports getting back to going to the gym more often, currently ~30-45 mins walks, chest press, knee press x 1-2 days/week   Objective:  Lab Results  Component Value Date   HGBA1C 6.9 07/01/2022    Lab Results  Component Value Date   CREATININE 1.08 07/01/2022   BUN 16 07/01/2022   NA 142 07/01/2022   K 4.7 07/01/2022   CL 105 07/01/2022   CO2 28 07/01/2022    Lab Results  Component Value Date   CHOL 136 07/01/2022   HDL 48 07/01/2022   LDLCALC 69 07/01/2022   TRIG 108 07/01/2022   CHOLHDL 2.8 07/01/2022   BP Readings from Last 3 Encounters:  07/01/22 134/62  03/27/22 118/70  01/07/22 (!) 146/80  Pulse Readings from Last 3 Encounters:  07/01/22 85  03/27/22 77  01/07/22 89     Medications Reviewed Today     Reviewed by [name], RPH-CPP (Pharmacist) on 11/03/22 at 1653  Med List Status: <None>   Medication Order Taking? Sig Documenting Provider Last Dose Status Informant   Accu-Chek Softclix Lancets lancets [id number]  Use to check Blood sugar once a day.  DX: E11.9 [name], NP  Active   Alcohol Swabs (DROPSAFE ALCOHOL PREP) 70 % PADS [id number]  USE AS DIRECTED TO CHECK BLOOD SUGAR ONCE A DAY Baity, [name], NP  Active   amLODipine (NORVASC) 10 MG tablet [id number] Yes Take 1 tablet (10 mg total) by mouth daily. [name], NP Taking Active   aspirin EC 81 MG tablet [id number]  81 mg daily. Swallow whole. [provider]  Active   blood glucose meter kit and supplies [id number]  Dispense based on patient and insurance preference. Use to check blood sugar daily as directed. DX Ell.9 [name], NP  Active   Blood Glucose Monitoring Suppl (ACCU-CHEK GUIDE) w/Device KIT [id number]  Use to check blood sugar daily for type 2 diabetes E11.9 [name], NP  Active   Cholecalciferol 125 MCG (5000 UT) capsule [id number]  Take 5,000 Units by mouth daily. [provider]  Active   citalopram (CELEXA) 10 MG tablet [id number]  Take 1 tablet (10 mg total) by mouth daily.  Patient not taking: Reported on 09/11/2022   [name], NP  Active   doxylamine, Sleep, (UNISOM) 25 MG tablet [id number]  Take 25 mg by mouth at bedtime as needed. [provider]  Active   glipiZIDE (GLUCOTROL) 10 MG tablet [id number] Yes Take 1 tablet (10 mg total) by mouth 2 (two) times daily before a meal. Baity, [name], NP Taking Active   glucose blood (ACCU-CHEK GUIDE) test strip [id number]  Use to check blood sugar once a day.  DX: E11.9 [name], NP  Active   isosorbide mononitrate (IMDUR) 30 MG 24 hr tablet [id number]  Take 1 tablet (30 mg total) by mouth once daily [provider]  Active   losartan (COZAAR) 25 MG tablet [id number] Yes Take 1 tablet by mouth daily. [provider] Taking Active   metFORMIN (GLUCOPHAGE) 850 MG tablet [id number] Yes Take 1 tablet (850 mg total) by mouth 2 (two) times daily with a meal. Baity, [name], NP  Taking Active   metoprolol tartrate (LOPRESSOR) 50 MG tablet [id number] Yes Take 1 tablet by mouth 2 (two) times daily. [provider] Taking Active   rosuvastatin (CRESTOR) 20 MG tablet [id number]  Take 1 tablet (20 mg total) by mouth daily. [name], NP  Active   Semaglutide 7 MG TABS [id number] Yes Take 7 mg by mouth daily. [provider] Taking Active               Assessment/Plan:     Diabetes: - Currently controlled - Reviewed long term cardiovascular and renal outcomes of uncontrolled blood sugar - Reviewed goal A1c, goal fasting, and goal 2 hour post prandial glucose - Have encouraged dietary modifications including: Encouraged patient to have regular well-balance meals throughout the day (including breakfast), while controlling carbohydrate portion sizes Encouraged patient to review nutrition labels for carbohydrate content of foods - Have counseled on s/s of low blood sugar and how to treat lows Patient carries glucose tablets with him in case needed for hypoglycemia -  Attempt to troubleshoot patient's glucometer error issue, but reports meter does not currently have an error. Patient plans to review manufacturer "how to use" video and follow up with local pharmacy or manufacturer if needed for further support - Recommend to continue to check glucose, keep a log of the results, have this record to review during future appointments, but to contact office sooner if needed for readings outside of established parameters or symptoms  - Collaborate with office and determine latest supply of Rybelsus from [employer] has arrived for patient to the office  Patient confirms will follow up with office to pick up supply     Hypertension: - Currently controlled - Recommended to check home blood pressure and heart rate using upper arm monitor, keep a log of the results and have this record to review during future appointments     Follow Up Plan: Clinical  Pharmacist will follow up with patient by telephone on 11/28/2022 at 10:00 AM   Estelle Grumbles, PharmD, [facility] Clinical Pharmacist [facility] [phone]

## 2022-11-05 NOTE — Patient Instructions (Signed)
Goals Addressed             This Visit's Progress    Pharmacy Goals       Our goal A1c is less than 7%. This corresponds with fasting sugars less than 130 and 2 hour after meal sugars less than 180. Please keep a log of your results when checking your blood sugar   Our goal bad cholesterol, or LDL, is less than 70 . This is why it is important to continue taking your rosuvastatin.  Check your blood pressure twice weekly, and any time you have concerning symptoms like headache, chest pain, dizziness, shortness of breath, or vision changes.   Our goal is less than 130/80.  To appropriately check your blood pressure, make sure you do the following:  1) Avoid caffeine, exercise, or tobacco products for 30 minutes before checking. Empty your bladder. 2) Sit with your back supported in a flat-backed chair. Rest your arm on something flat (arm of the chair, table, etc). 3) Sit still with your feet flat on the floor, resting, for at least 5 minutes.  4) Check your blood pressure. Take 1-2 readings.  5) Write down these readings and bring with you to any provider appointments.  Bring your home blood pressure machine with you to a provider's office for accuracy comparison at least once a year.   Make sure you take your blood pressure medications before you come to any office visit, even if you were asked to fast for labs.   Estelle Grumbles, PharmD, [facility] Clinical Pharmacist [facility] [phone]

## 2022-11-10 DIAGNOSIS — H40013 Open angle with borderline findings, low risk, bilateral: Secondary | ICD-10-CM | POA: Diagnosis not present

## 2022-11-10 DIAGNOSIS — E119 Type 2 diabetes mellitus without complications: Secondary | ICD-10-CM | POA: Diagnosis not present

## 2022-11-10 DIAGNOSIS — H2513 Age-related nuclear cataract, bilateral: Secondary | ICD-10-CM | POA: Diagnosis not present

## 2022-11-10 DIAGNOSIS — H1045 Other chronic allergic conjunctivitis: Secondary | ICD-10-CM | POA: Diagnosis not present

## 2022-11-10 LAB — HM DIABETES EYE EXAM

## 2022-11-12 ENCOUNTER — Encounter: Payer: Self-pay | Admitting: Internal Medicine

## 2022-11-13 ENCOUNTER — Other Ambulatory Visit: Payer: Self-pay

## 2022-11-13 NOTE — Patient Outreach (Signed)
Care Management   Visit Note  11/13/2022 Name: Daniel Bradley. MRN: [id number] DOB: Mar 26, 1955  Subjective: Chandara Rorrer. is a 67 y.o. year old male who is a primary care patient of [name], NP. The Care Management team was consulted for assistance.      Engaged with patient spoke with patient by telephone.    Goals Addressed             This Visit's Progress    RNCM Care Management Expected Outcome:  Monitor, Self-Manage and Reduce Symptoms of Diabetes       Current Barriers:  Knowledge Deficits related to the significance of controlling blood sugars and keeping A1C levels down Care Coordination needs related to medication cost constraints  in a patient with DM Chronic Disease Management support and education needs related to effective management of DM Financial Constraints.  Lab Results  Component Value Date   HGBA1C 6.9 07/01/2022     Planned Interventions: Provided education to patient about basic DM disease process. Review and education provided. The patient is doing well and has improved his A1C level to at goal. Praised the patient for doing well with getting his numbers down. Continues to work with pharm D. Saw the pcp in May. The patient has made some changes and now is in his own apartment. The patient has adjust to the move and states that he has lost his motivation but he is trying to get motivated again. Reflective listening and support given.  Reviewed medications with patient and discussed importance of medication adherence. The patient is compliant with medications. The patient has been approved for Rybelsus and is thankful for this. This is a medication that helped him a lot in the past. Review of the notes and the patient will be able to pick up the medications at the office when they are in. The patient continues to work with pharm D on a regular basis. Will follow up with pharm D later this month. Education and support given. ;        Reviewed  prescribed diet with patient heart healthy/ADA diet.  He admits sweets are his weak points. Education provided on eating balanced meals and monitoring for abnormal readings. The patient is being more mindful of his eating habits. States he is eating better and he is watching what he eats. Encouraged the patient to continue with good eating habits and not to get hooked on junk foods and fast foods now that he is living on his own. Education and support provided.  Counseled on importance of regular laboratory monitoring as prescribed. Has regular lab work done;        Discussed plans with patient for ongoing care management follow up and provided patient with direct contact information for care management team;      Provided patient with written educational materials related to hypo and hyperglycemia and importance of correct treatment.  The patient denies any sx and sx of hypo or hyperglycemia. Denies any sx and sx of hypo or hyperglycemia. Review with the patient. The patient is waiting on a new meter as his other one will not register his readings.  Reviewed scheduled/upcoming provider appointments including:  01-12-2023 at 840 am Advised patient, providing education and rationale, to check cbg twice daily and when you have symptoms of low or high blood sugar and record. The patient is taking his blood sugars. Review of the goal of fasting is <130 and post prandial of <180.  The patient has  been having issues with his meter. The patient states that he will stick his finger but it is not picking it up. He has called [employer] and they are sending him a new meter. He feels like he is doing well and will have readings when he gets his new meter.  Call provider for findings outside established parameters;       Referral made to pharmacy team for assistance with assistance with cost constraints related to Rybelsus and inability to pay for refills. The patient is working with the pharm D for medication  cost constraints. The patient has been approved for Rybelsus and will be covered through 2024. Continues to work with the pharm D on a regular basis Review of patient status, including review of consultants reports, relevant laboratory and other test results, and medications completed;       Advised patient to discuss changes in DM health and well being with provider;      Screening for signs and symptoms of depression related to chronic disease state;        Assessed social determinant of health barriers;         Symptom Management: Take medications as prescribed   Attend all scheduled provider appointments Call provider office for new concerns or questions  call the Suicide and Crisis Lifeline: 988 call the [place] National Suicide Prevention Lifeline: (346) 208-2770 or TTY: 8082362226 TTY 5407746083) to talk to a trained counselor call 1-800-273-TALK (toll free, 24 hour hotline) if experiencing a Mental Health or Behavioral Health Crisis  check feet daily for cuts, sores or redness trim toenails straight across manage portion size wash and dry feet carefully every day wear comfortable, cotton socks wear comfortable, well-fitting shoes  Follow Up Plan: Telephone follow up appointment with care management team member scheduled for: 12-31-2022 at 100 pm       RNCM Care Management Expected Outcome:  Monitor, Self-Manage and Reduce Symptoms of: HLD       Current Barriers:  Chronic Disease Management support and education needs related to effective management of HLD BP Readings from Last 3 Encounters:  07/01/22 134/62  03/27/22 118/70  01/07/22 (!) 146/80    Lab Results  Component Value Date   CHOL 136 07/01/2022   CHOL 150 01/07/2022   CHOL 229 (H) 10/15/2021   Lab Results  Component Value Date   HDL 49 01/07/2022   HDL 49 10/15/2021   HDL 51 06/20/2021   Lab Results  Component Value Date   LDLCALC 69 07/01/2022   LDLCALC 83 01/07/2022   LDLCALC 152 (H) 10/15/2021    Lab Results  Component Value Date   TRIG 86 01/07/2022   TRIG 153 (H) 10/15/2021   TRIG 177 (H) 06/20/2021   Lab Results  Component Value Date   CHOLHDL 2.8 07/01/2022   CHOLHDL 3.1 01/07/2022   CHOLHDL 4.7 10/15/2021     Planned Interventions: Provider established cholesterol goals reviewed.Review of goals. Review of goals and the patient is at goal currently. Review of making sure he is taking his medications and following the plan of care established by the provider. The patient is doing well with staying at goal with cholesterol readings. He also is having more stable blood pressures. Education and support given.  ; Counseled on importance of regular laboratory monitoring as prescribed. Has lab work done on a regular basis. Most recent in May. Provided HLD educational materials. Education and support given; Reviewed role and benefits of statin for ASCVD risk reduction; The  patient has not been taking his blood pressures recently due to moving into an apartment. Education and support given.  Discussed strategies to manage statin-induced myalgias; Reviewed importance of limiting foods high in cholesterol. Review of heart healthy/ADA diet-He is trying to be more mindful of foods that are not good for him. Sweets is his weakness, is doing better with managing his dietary restrictions. Will continue to monitor for changes; Reviewed exercise goals and target of 150 minutes per week. Patient goes to the gym and works out on a regular basis. Usually works out one hour and 15 minutes. He states he feels a lot better when he goes to the gym regularly. He says that lately has not been motivated and has not been going as much. He has been a little down due to having to move out of his house. He wanted to keep his house. Allowed the patient to express his thoughts and feelings and talk about things going on. Encouraged the patient to set small goals and work toward those goals. The patient states  that he is going to work on getting into a better routine. Education and support given; Screening for signs and symptoms of depression related to chronic disease state;  Assessed social determinant of health barriers;  Incoming call with VM received from the patient asking for assistance with medications. Call made back to the patient and assisted him with his needs. The patient states that the pharmacy called him to pick up a medication and he was not sure if he was on it and took it. Review of the patients medications and the patient is taking the Isosorbide Mononitrate ER 30 mg daily. Reviewed the notes of the cardiologist with the patient. The patient just wanted to make sure he was taking what he was supposed to take. Education provided. No new concerns or needs at this time. Will continue to monitor and follow up accordingly. The patient will call if he has any new needs between outreaches.   Symptom Management: Take medications as prescribed   Attend all scheduled provider appointments Call provider office for new concerns or questions  call the Suicide and Crisis Lifeline: 988 call the [place] National Suicide Prevention Lifeline: 781-239-1632 or TTY: 431 413 0073 TTY (703) 405-5724) to talk to a trained counselor call 1-800-273-TALK (toll free, 24 hour hotline) if experiencing a Mental Health or Behavioral Health Crisis  - call for medicine refill 2 or 3 days before it runs out - take all medications exactly as prescribed - call doctor with any symptoms you believe are related to your medicine - call doctor when you experience any new symptoms - go to all doctor appointments as scheduled - adhere to prescribed diet: heart healthy/ADA diet   Follow Up Plan: Telephone follow up appointment with care management team member scheduled for: 12-31-2022 at 100 pm              Consent to Services:  Patient was given information about care management services, agreed to services, and gave  verbal consent to participate.   Plan: Telephone follow up appointment with care management team member scheduled for: 12-31-2022 at 1 pm  Alto Denver RN, MSN, CCM RN Care Manager  [facility]  Ambulatory Care Management  Direct Number: [phone]

## 2022-11-13 NOTE — Patient Instructions (Signed)
Visit Information  Thank you for taking time to visit with me today. Please don't hesitate to contact me if I can be of assistance to you before our next scheduled telephone appointment.  Following are the goals we discussed today:   Goals Addressed             This Visit's Progress    RNCM Care Management Expected Outcome:  Monitor, Self-Manage and Reduce Symptoms of Diabetes       Current Barriers:  Knowledge Deficits related to the significance of controlling blood sugars and keeping A1C levels down Care Coordination needs related to medication cost constraints  in a patient with DM Chronic Disease Management support and education needs related to effective management of DM Financial Constraints.  Lab Results  Component Value Date   HGBA1C 6.9 07/01/2022     Planned Interventions: Provided education to patient about basic DM disease process. Review and education provided. The patient is doing well and has improved his A1C level to at goal. Praised the patient for doing well with getting his numbers down. Continues to work with pharm D. Saw the pcp in May. The patient has made some changes and now is in his own apartment. The patient has adjust to the move and states that he has lost his motivation but he is trying to get motivated again. Reflective listening and support given.  Reviewed medications with patient and discussed importance of medication adherence. The patient is compliant with medications. The patient has been approved for Rybelsus and is thankful for this. This is a medication that helped him a lot in the past. Review of the notes and the patient will be able to pick up the medications at the office when they are in. The patient continues to work with pharm D on a regular basis. Will follow up with pharm D later this month. Education and support given. ;        Reviewed prescribed diet with patient heart healthy/ADA diet.  He admits sweets are his weak points. Education  provided on eating balanced meals and monitoring for abnormal readings. The patient is being more mindful of his eating habits. States he is eating better and he is watching what he eats. Encouraged the patient to continue with good eating habits and not to get hooked on junk foods and fast foods now that he is living on his own. Education and support provided.  Counseled on importance of regular laboratory monitoring as prescribed. Has regular lab work done;        Discussed plans with patient for ongoing care management follow up and provided patient with direct contact information for care management team;      Provided patient with written educational materials related to hypo and hyperglycemia and importance of correct treatment.  The patient denies any sx and sx of hypo or hyperglycemia. Denies any sx and sx of hypo or hyperglycemia. Review with the patient. The patient is waiting on a new meter as his other one will not register his readings.  Reviewed scheduled/upcoming provider appointments including:  01-12-2023 at 840 am Advised patient, providing education and rationale, to check cbg twice daily and when you have symptoms of low or high blood sugar and record. The patient is taking his blood sugars. Review of the goal of fasting is <130 and post prandial of <180.  The patient has been having issues with his meter. The patient states that he will stick his finger but it is not picking  it up. He has called the insurance company and they are sending him a new meter. He feels like he is doing well and will have readings when he gets his new meter.  Call provider for findings outside established parameters;       Referral made to pharmacy team for assistance with assistance with cost constraints related to Rybelsus and inability to pay for refills. The patient is working with the pharm D for medication cost constraints. The patient has been approved for Rybelsus and will be covered through 2024.  Continues to work with the pharm D on a regular basis Review of patient status, including review of consultants reports, relevant laboratory and other test results, and medications completed;       Advised patient to discuss changes in DM health and well being with provider;      Screening for signs and symptoms of depression related to chronic disease state;        Assessed social determinant of health barriers;         Symptom Management: Take medications as prescribed   Attend all scheduled provider appointments Call provider office for new concerns or questions  call the Suicide and Crisis Lifeline: 988 call the [place] National Suicide Prevention Lifeline: 936-106-3079 or TTY: 530-146-1745 TTY 214-059-5543) to talk to a trained counselor call 1-800-273-TALK (toll free, 24 hour hotline) if experiencing a Mental Health or Behavioral Health Crisis  check feet daily for cuts, sores or redness trim toenails straight across manage portion size wash and dry feet carefully every day wear comfortable, cotton socks wear comfortable, well-fitting shoes  Follow Up Plan: Telephone follow up appointment with care management team member scheduled for: 12-31-2022 at 100 pm       RNCM Care Management Expected Outcome:  Monitor, Self-Manage and Reduce Symptoms of: HLD       Current Barriers:  Chronic Disease Management support and education needs related to effective management of HLD BP Readings from Last 3 Encounters:  07/01/22 134/62  03/27/22 118/70  01/07/22 (!) 146/80    Lab Results  Component Value Date   CHOL 136 07/01/2022   CHOL 150 01/07/2022   CHOL 229 (H) 10/15/2021   Lab Results  Component Value Date   HDL 49 01/07/2022   HDL 49 10/15/2021   HDL 51 06/20/2021   Lab Results  Component Value Date   LDLCALC 69 07/01/2022   LDLCALC 83 01/07/2022   LDLCALC 152 (H) 10/15/2021   Lab Results  Component Value Date   TRIG 86 01/07/2022   TRIG 153 (H) 10/15/2021   TRIG 177  (H) 06/20/2021   Lab Results  Component Value Date   CHOLHDL 2.8 07/01/2022   CHOLHDL 3.1 01/07/2022   CHOLHDL 4.7 10/15/2021     Planned Interventions: Provider established cholesterol goals reviewed.Review of goals. Review of goals and the patient is at goal currently. Review of making sure he is taking his medications and following the plan of care established by the provider. The patient is doing well with staying at goal with cholesterol readings. He also is having more stable blood pressures. Education and support given.  ; Counseled on importance of regular laboratory monitoring as prescribed. Has lab work done on a regular basis. Most recent in May. Provided HLD educational materials. Education and support given; Reviewed role and benefits of statin for ASCVD risk reduction; The patient has not been taking his blood pressures recently due to moving into an apartment. Education and support given.  Discussed strategies to manage statin-induced myalgias; Reviewed importance of limiting foods high in cholesterol. Review of heart healthy/ADA diet-He is trying to be more mindful of foods that are not good for him. Sweets is his weakness, is doing better with managing his dietary restrictions. Will continue to monitor for changes; Reviewed exercise goals and target of 150 minutes per week. Patient goes to the gym and works out on a regular basis. Usually works out one hour and 15 minutes. He states he feels a lot better when he goes to the gym regularly. He says that lately has not been motivated and has not been going as much. He has been a little down due to having to move out of his house. He wanted to keep his house. Allowed the patient to express his thoughts and feelings and talk about things going on. Encouraged the patient to set small goals and work toward those goals. The patient states that he is going to work on getting into a better routine. Education and support given; Screening for  signs and symptoms of depression related to chronic disease state;  Assessed social determinant of health barriers;  Incoming call with VM received from the patient asking for assistance with medications. Call made back to the patient and assisted him with his needs. The patient states that the pharmacy called him to pick up a medication and he was not sure if he was on it and took it. Review of the patients medications and the patient is taking the Isosorbide Mononitrate ER 30 mg daily. Reviewed the notes of the cardiologist with the patient. The patient just wanted to make sure he was taking what he was supposed to take. Education provided. No new concerns or needs at this time. Will continue to monitor and follow up accordingly. The patient will call if he has any new needs between outreaches.   Symptom Management: Take medications as prescribed   Attend all scheduled provider appointments Call provider office for new concerns or questions  call the Suicide and Crisis Lifeline: 988 call the [place] National Suicide Prevention Lifeline: (581) 138-8472 or TTY: 231-431-7263 TTY 917-522-3669) to talk to a trained counselor call 1-800-273-TALK (toll free, 24 hour hotline) if experiencing a Mental Health or Behavioral Health Crisis  - call for medicine refill 2 or 3 days before it runs out - take all medications exactly as prescribed - call doctor with any symptoms you believe are related to your medicine - call doctor when you experience any new symptoms - go to all doctor appointments as scheduled - adhere to prescribed diet: heart healthy/ADA diet   Follow Up Plan: Telephone follow up appointment with care management team member scheduled for: 12-31-2022 at 100 pm           Our next appointment is by telephone on 12-31-2022 at 1 pm  Please call the care guide team at [phone] if you need to cancel or reschedule your appointment.   If you are experiencing a Mental Health or Behavioral  Health Crisis or need someone to talk to, please call the Suicide and Crisis Lifeline: 988 call the [place] National Suicide Prevention Lifeline: (603) 631-0595 or TTY: 949-020-6503 TTY 774 221 9775) to talk to a trained counselor call 1-800-273-TALK (toll free, 24 hour hotline)   Patient verbalizes understanding of instructions and care plan provided today and agrees to view in MyChart. Active MyChart status and patient understanding of how to access instructions and care plan via MyChart confirmed with patient.  Telephone follow up appointment with care management team member scheduled for:  12-31-2022 at 1 pm  Alto Denver RN, MSN, CCM RN Care Manager  [facility]  Ambulatory Care Management  Direct Number: [phone]

## 2022-11-14 ENCOUNTER — Other Ambulatory Visit: Payer: Medicare HMO

## 2022-11-18 ENCOUNTER — Ambulatory Visit: Payer: Medicare HMO | Admitting: Urology

## 2022-11-20 ENCOUNTER — Other Ambulatory Visit: Payer: Self-pay | Admitting: Internal Medicine

## 2022-11-20 NOTE — Telephone Encounter (Signed)
Requested Prescriptions  Pending Prescriptions Disp Refills   amLODipine (NORVASC) 10 MG tablet [Pharmacy Med Name: AMLODIPINE BESYLATE 10MG  TABLETS] 90 tablet 0    Sig: TAKE 1 TABLET(10 MG) BY MOUTH DAILY     Cardiovascular: Calcium Channel Blockers 2 Passed - 11/20/2022  7:38 AM      Passed - Last BP in normal range    BP Readings from Last 1 Encounters:  07/01/22 134/62         Passed - Last Heart Rate in normal range    Pulse Readings from Last 1 Encounters:  07/01/22 85         Passed - Valid encounter within last 6 months    Recent Outpatient Visits           2 weeks ago Type 2 diabetes mellitus with other specified complication, without long-term current use of insulin (HCC)   [place] [facility] [place], Gentry Fitz A, RPH-CPP   4 months ago Class 2 severe obesity due to excess calories with serious comorbidity and body mass index (BMI) of 36.0 to 36.9 in adult [facility])   [place] [facility] [place], [place] W, NP   7 months ago Type 2 diabetes mellitus without complication, without long-term current use of insulin [facility])   [place] [facility] [place], [name], NP   10 months ago Encounter for general adult medical examination with abnormal findings   [place] [facility] [place], [place] W, NP   11 months ago Type 2 diabetes mellitus with other specified complication, without long-term current use of insulin [facility])   [place] [facility] [place], [name], NP       Future Appointments             In 2 weeks Vanna Scotland, MD [facility] [place]   In 1 month [place], [name], NP [place] [facility], [facility]

## 2022-11-28 ENCOUNTER — Telehealth: Payer: Self-pay | Admitting: Pharmacist

## 2022-11-28 ENCOUNTER — Telehealth: Payer: Medicare HMO

## 2022-11-28 NOTE — Telephone Encounter (Signed)
Outreach Note  11/28/2022 Name: Anthon Plotts. MRN: [id number] DOB: 1956-02-08  Referred by: [name], NP  Was unable to reach patient via telephone today and have left HIPAA compliant voicemail asking patient to return my call.    Follow Up Plan: Will collaborate with Care Guide to outreach to schedule follow up with me  Estelle Grumbles, PharmD, [facility] Clinical Pharmacist [facility] [phone]

## 2022-12-03 ENCOUNTER — Other Ambulatory Visit: Payer: Medicare HMO

## 2022-12-03 ENCOUNTER — Other Ambulatory Visit: Payer: Self-pay

## 2022-12-03 DIAGNOSIS — Z8546 Personal history of malignant neoplasm of prostate: Secondary | ICD-10-CM

## 2022-12-04 ENCOUNTER — Other Ambulatory Visit: Payer: Medicare HMO

## 2022-12-04 LAB — PSA: Prostate Specific Ag, Serum: <0.1 ng/mL (ref 0.0–4.0)

## 2022-12-05 ENCOUNTER — Other Ambulatory Visit: Payer: Self-pay

## 2022-12-05 DIAGNOSIS — N200 Calculus of kidney: Secondary | ICD-10-CM

## 2022-12-10 ENCOUNTER — Ambulatory Visit: Payer: Medicare HMO | Admitting: Urology

## 2022-12-10 ENCOUNTER — Ambulatory Visit
Admission: RE | Admit: 2022-12-10 | Discharge: 2022-12-10 | Disposition: A | Discharge status: Home / Self Care | Payer: Medicare HMO | Attending: Urology | Admitting: Urology

## 2022-12-15 ENCOUNTER — Other Ambulatory Visit: Payer: Self-pay | Admitting: Internal Medicine

## 2022-12-16 NOTE — Telephone Encounter (Signed)
Requested Prescriptions  Pending Prescriptions Disp Refills   amLODipine (NORVASC) 10 MG tablet [Pharmacy Med Name: amLODIPine Besylate Oral Tablet 10 MG] 90 tablet 3    Sig: TAKE 1 TABLET EVERY DAY     Cardiovascular: Calcium Channel Blockers 2 Passed - 12/15/2022 10:26 AM      Passed - Last BP in normal range    BP Readings from Last 1 Encounters:  07/01/22 134/62         Passed - Last Heart Rate in normal range    Pulse Readings from Last 1 Encounters:  07/01/22 85         Passed - Valid encounter within last 6 months    Recent Outpatient Visits           1 month ago Type 2 diabetes mellitus with other specified complication, without long-term current use of insulin (HCC)   [facility] D[place], Gentry Fitz A, RPH-CPP   5 months ago Class 2 severe obesity due to excess calories with serious comorbidity and body mass index (BMI) of 36.0 to 36.9 in adult [facility])   [facility] [place], [place], NP   8 months ago Type 2 diabetes mellitus without complication, without long-term current use of insulin (HCC)   [facility] [place], [place], NP   11 months ago Encounter for general adult medical examination with abnormal findings   [facility] [place], [name], NP   1 year ago Type 2 diabetes mellitus with other specified complication, without long-term current use of insulin (HCC)   [facility] [place], [name], NP       Future Appointments             In 3 weeks [place], [name], NP [facility]             glipiZIDE (GLUCOTROL) 10 MG tablet [Pharmacy Med Name: glipiZIDE Oral Tablet 10 MG] 180 tablet 3    Sig: TAKE 1 TABLET TWICE DAILY BEFORE MEALS     Endocrinology:  Diabetes - Sulfonylureas Passed - 12/15/2022 10:26 AM      Passed - HBA1C is between 0 and 7.9 and within 180 days    HbA1c, POC (controlled diabetic  range)  Date Value Ref Range Status  07/01/2022 6.9 0.0 - 7.0 % Final         Passed - Cr in normal range and within 360 days    Creat  Date Value Ref Range Status  07/01/2022 1.08 0.70 - 1.35 mg/dL Final   Creatinine, Urine  Date Value Ref Range Status  01/07/2022 120 20 - 320 mg/dL Final         Passed - Valid encounter within last 6 months    Recent Outpatient Visits           1 month ago Type 2 diabetes mellitus with other specified complication, without long-term current use of insulin (HCC)   [place] [facility] Delles, Gentry Fitz A, RPH-CPP   5 months ago Class 2 severe obesity due to excess calories with serious comorbidity and body mass index (BMI) of 36.0 to 36.9 in adult [facility])   [facility] [place], [place] W, NP   8 months ago Type 2 diabetes mellitus without complication, without long-term current use of insulin (HCC)    [facility]  [name], NP   11 months ago Encounter for general adult medical examination with abnormal findings   [facility] [place], [place], NP   1 year ago Type 2 diabetes mellitus with other specified complication, without long-term current use of insulin [facility])   [facility] [place], [name], NP       Future Appointments             In 3 weeks [place], [name], NP [facility]             rosuvastatin (CRESTOR) 20 MG tablet [Pharmacy Med Name: Rosuvastatin Calcium Oral Tablet 20 MG] 90 tablet 3    Sig: TAKE 1 TABLET EVERY DAY     Cardiovascular:  Antilipid - Statins 2 Failed - 12/15/2022 10:26 AM      Failed - Lipid Panel in normal range within the last 12 months    Cholesterol, Total  Date Value Ref Range Status  03/30/2020 128 100 - 199 mg/dL Final   Cholesterol  Date Value Ref Range Status  07/01/2022 136 <200 mg/dL Final   LDL Cholesterol (Calc)  Date Value Ref  Range Status  07/01/2022 69 mg/dL (calc) Final    Comment:    Reference range: <100 . Desirable range <100 mg/dL for primary prevention;   <70 mg/dL for patients with CHD or diabetic patients  with > or = 2 CHD risk factors. Marland Kitchen LDL-C is now calculated using the Martin-Hopkins  calculation, which is a validated novel method providing  better accuracy than the Friedewald equation in the  estimation of LDL-C.  Horald Pollen et al. Lenox Ahr. 2951;884(16): 2061-2068  (http://education.[employer].com/faq/FAQ164)    HDL  Date Value Ref Range Status  07/01/2022 48 > OR = 40 mg/dL Final  60/63/0160 41 >10 mg/dL Final   Triglycerides  Date Value Ref Range Status  07/01/2022 108 <150 mg/dL Final         Passed - Cr in normal range and within 360 days    Creat  Date Value Ref Range Status  07/01/2022 1.08 0.70 - 1.35 mg/dL Final   Creatinine, Urine  Date Value Ref Range Status  01/07/2022 120 20 - 320 mg/dL Final         Passed - Patient is not pregnant      Passed - Valid encounter within last 12 months    Recent Outpatient Visits           1 month ago Type 2 diabetes mellitus with other specified complication, without long-term current use of insulin (HCC)   [place] [facility] Delles, Gentry Fitz A, RPH-CPP   5 months ago Class 2 severe obesity due to excess calories with serious comorbidity and body mass index (BMI) of 36.0 to 36.9 in adult [facility])   [facility] [place], [place], NP   8 months ago Type 2 diabetes mellitus without complication, without long-term current use of insulin [facility])   [facility], [name], NP   11 months ago Encounter for general adult medical examination with abnormal findings   [facility] [place], [place], NP   1 year ago Type 2 diabetes mellitus with other specified complication, without long-term current use of insulin [facility])   [place]  [facility] [place], [name], NP       Future Appointments  In 3 weeks [place], [name], NP [facility]

## 2022-12-17 ENCOUNTER — Encounter: Payer: Self-pay | Admitting: Urology

## 2022-12-29 ENCOUNTER — Ambulatory Visit: Payer: Medicare HMO | Admitting: Pharmacist

## 2022-12-29 DIAGNOSIS — E1169 Type 2 diabetes mellitus with other specified complication: Secondary | ICD-10-CM

## 2022-12-29 DIAGNOSIS — I10 Essential (primary) hypertension: Secondary | ICD-10-CM

## 2022-12-29 NOTE — Patient Instructions (Signed)
Goals Addressed             This Visit's Progress    Pharmacy Goals       Please watch the mail for an envelope from [facility] containing the patient assistance program application. Please complete this application and mail back to [facility] Pharmacy Technician Noreene Larsson [place] along with a copy of your Medicare Part D prescription card and a copy of your proof of income document OR you can bring these documents to the office to have them faxed back to Attention: Pattricia Boss at Fax # [phone]   If you need to call Noreene Larsson, you can reach her at [phone]  If you need to reach out to patient assistance programs regarding refills or to find out the status of your application, you can do so by calling:  [employer] at [phone]  Our goal A1c is less than 7%. This corresponds with fasting sugars less than 130 and 2 hour after meal sugars less than 180. Please keep a log of your results when checking your blood sugar   Our goal bad cholesterol, or LDL, is less than 70 . This is why it is important to continue taking your rosuvastatin.  Check your blood pressure twice weekly, and any time you have concerning symptoms like headache, chest pain, dizziness, shortness of breath, or vision changes.   Our goal is less than 130/80.  To appropriately check your blood pressure, make sure you do the following:  1) Avoid caffeine, exercise, or tobacco products for 30 minutes before checking. Empty your bladder. 2) Sit with your back supported in a flat-backed chair. Rest your arm on something flat (arm of the chair, table, etc). 3) Sit still with your feet flat on the floor, resting, for at least 5 minutes.  4) Check your blood pressure. Take 1-2 readings.  5) Write down these readings and bring with you to any provider appointments.  Bring your home blood pressure machine with you to a provider's office for accuracy comparison at least once a year.   Make sure you take your blood  pressure medications before you come to any office visit, even if you were asked to fast for labs.   Estelle Grumbles, PharmD, [facility] Clinical Pharmacist [facility] [phone]

## 2022-12-29 NOTE — Progress Notes (Signed)
12/29/2022 Name: [name]. MRN: [id number] DOB: [birth year]  Chief Complaint  Patient presents with   Medication Management   Medication Assistance    Nicandro Olyver Vandervelden. is a 67 y.o. year old male who presented for a telephone visit.   They were referred to the pharmacist by their PCP for assistance in managing diabetes and hypertension.      Subjective:   Care Team: Primary Care Provider: [name], NP ; Next Scheduled Visit: 01/12/2023 Cardiologist: Mirian Capuchin, MD Urologist: Vanna Scotland, MD Nurse Care Manager: Marlowe Sax, RN; Next Scheduled Visit: 12/31/2022  Medication Access/Adherence  Current Pharmacy:  [facility] - [place], [place] - [address] [place] [address] Phone: [phone] Fax: [phone]  [facility] [phone] [place], [place] - [address] AT [facility] [address] [place] [address] Phone: [phone] Fax: [phone]   Patient reports affordability concerns with their medications: No  Patient reports access/transportation concerns to their pharmacy: No  Patient reports adherence concerns with their medications:  No     Reports takes his medications directly from pill bottles and has a routine to aid with adherence; denies missed doses   Reports that he has not been monitoring either his blood sugar or blood pressure recently as has been busy with packing to move to a house in December   Diabetes:   Current medications:  Glipizide 10 mg twice daily before breakfast and supper (30 minutes prior to meal) Metformin 850 mg twice daily Rybelsus 7 mg daily  Confirms taking on an empty stomach, >=30 minutes before the first food, beverage, or other oral medications of the day with <=4 oz of plain water only   Medications tried in the past: Rybelsus (cost); Ozempic (cost); Evaristo Bury (cost)    Denies checking blood sugar recently;  reports has been busy with getting ready to move      Statin therapy: rosuvastatin 20 mg daily   Current medication access support: enrolled in patient assistance for Rybelsus from [employer] through 02/10/2023  - Reports has 1.5 month supply of Rybelsus remaining   Hypertension:   Current medications:  amlodipine 10 mg daily metoprolol 50 mg twice daily Losartan 25 mg daily   Medications previously tried:    Patient has a validated, automated, upper arm home BP cuff Denies checking blood pressure recently; reports has been busy with getting ready to move      Patient denies hypotensive s/sx including dizziness, lightheadedness.    Current meal patterns: admits to occasionally adding salt to his food, but trying to limit   Objective:  Lab Results  Component Value Date   HGBA1C 6.9 07/01/2022    Lab Results  Component Value Date   CREATININE 1.08 07/01/2022   BUN 16 07/01/2022   NA 142 07/01/2022   K 4.7 07/01/2022   CL 105 07/01/2022   CO2 28 07/01/2022    Lab Results  Component Value Date   CHOL 136 07/01/2022   HDL 48 07/01/2022   LDLCALC 69 07/01/2022   TRIG 108 07/01/2022   CHOLHDL 2.8 07/01/2022   BP Readings from Last 3 Encounters:  07/01/22 134/62  03/27/22 118/70  01/07/22 (!) 146/80   Pulse Readings from Last 3 Encounters:  07/01/22 85  03/27/22 77  01/07/22 89     Medications Reviewed Today   Medications were not reviewed in this encounter  Assessment/Plan:   Diabetes: - Currently controlled - Reviewed long term cardiovascular and renal outcomes of uncontrolled blood sugar - Reviewed goal A1c, goal fasting, and goal 2 hour post prandial glucose - Have encouraged dietary modifications including: Encouraged patient to have regular well-balance meals throughout the day (including breakfast), while controlling carbohydrate portion sizes Encouraged patient to review nutrition labels for carbohydrate content of foods - Have  counseled on s/s of low blood sugar and how to treat lows Patient carries glucose tablets with him in case needed for hypoglycemia - Recommend to restart checking glucose, keep a log of the results, have this record to review during future appointments, but to contact office sooner if needed for readings outside of established parameters or symptoms  - Will collaborate with PCP and CPhT to aid patient with re-enrollment in patient assistance program for Rybelsus from [employer] for 2025      Hypertension: - Currently controlled - Recommended to restart checking home blood pressure and heart rate using upper arm monitor, keep a log of the results and have this record to review during future appointments        Follow Up Plan: Clinical Pharmacist will follow up with patient by telephone on 03/09/2023 at 10:00 AM    Estelle Grumbles, PharmD, [facility] Clinical Pharmacist [facility] [phone]

## 2022-12-31 ENCOUNTER — Other Ambulatory Visit: Payer: Self-pay

## 2022-12-31 NOTE — Patient Instructions (Signed)
Visit Information  Thank you for taking time to visit with me today. Please don't hesitate to contact me if I can be of assistance to you before our next scheduled telephone appointment.  Following are the goals we discussed today:   Goals Addressed             This Visit's Progress    RNCM Care Management Expected Outcome:  Monitor, Self-Manage and Reduce Symptoms of Diabetes       Current Barriers:  Knowledge Deficits related to the significance of controlling blood sugars and keeping A1C levels down Care Coordination needs related to medication cost constraints  in a patient with DM Chronic Disease Management support and education needs related to effective management of DM Financial Constraints.  Lab Results  Component Value Date   HGBA1C 6.9 07/01/2022     Planned Interventions: Provided education to patient about basic DM disease process. Review and education provided. The patient is doing well and has improved his A1C level to at goal.  Continues to work with pharm D. The patient is getting ready to move in his house that he bought and he is happy about this. He has been a little down because of all the things he has been through this past year.  Allowed the patient to reflect on his life and things that have been bothering him. He states that he is ready to be in his new house and being able to mow his yard and do all kinds do things outside. This is the area he grew up in and he is excited about this. He will be able to move on 01-26-2023. Reviewed medications with patient and discussed importance of medication adherence. The patient is compliant with medications. The patient has been approved for Rybelsus and is thankful for this. This is a medication that helped him a lot in the past. Review of the notes and the patient will be able to pick up the medications at the office when they are in. The patient continues to work with pharm D on a regular basis.  Reviewed prescribed diet  with patient heart healthy/ADA diet.  He admits sweets are his weak points. Education provided on eating balanced meals and monitoring for abnormal readings. The patient is being more mindful of his eating habits. States he is eating better and he is watching what he eats. Encouraged the patient to continue with good eating habits and not to get hooked on junk foods and fast foods now that he is living on his own. Education and support provided.  Counseled on importance of regular laboratory monitoring as prescribed. Has regular lab work done;        Discussed plans with patient for ongoing care management follow up and provided patient with direct contact information for care management team;      Provided patient with written educational materials related to hypo and hyperglycemia and importance of correct treatment.  The patient denies any sx and sx of hypo or hyperglycemia. Denies any sx and sx of hypo or hyperglycemia. Review with the patient. The patient admits he has not been taking his blood sugars but he is going to get back on track.  Reviewed scheduled/upcoming provider appointments including:  01-12-2023 at 840 am Advised patient, providing education and rationale, to check cbg twice daily and when you have symptoms of low or high blood sugar and record. The patient is taking his blood sugars. Review of the goal of fasting is <130 and post  prandial of <180.  Encouraged the patient to start back taking his blood sugars so he will know when his levels are changing.  Call provider for findings outside established parameters;       Referral made to pharmacy team for assistance with assistance with cost constraints related to Rybelsus and inability to pay for refills. The patient is working with the pharm D for medication cost constraints. The patient has been approved for Rybelsus and will be covered through 2024. Continues to work with the pharm D on a regular basis Review of patient status,  including review of consultants reports, relevant laboratory and other test results, and medications completed;       Advised patient to discuss changes in DM health and well being with provider;      Screening for signs and symptoms of depression related to chronic disease state;        Assessed social determinant of health barriers;         Symptom Management: Take medications as prescribed   Attend all scheduled provider appointments Call provider office for new concerns or questions  call the Suicide and Crisis Lifeline: 988 call the [place] National Suicide Prevention Lifeline: 561-745-3839 or TTY: (747)681-1422 TTY 951-631-7005) to talk to a trained counselor call 1-800-273-TALK (toll free, 24 hour hotline) if experiencing a Mental Health or Behavioral Health Crisis  check feet daily for cuts, sores or redness trim toenails straight across manage portion size wash and dry feet carefully every day wear comfortable, cotton socks wear comfortable, well-fitting shoes  Follow Up Plan: Telephone follow up appointment with care management team member scheduled for: 03-04-2023 at 100 pm       RNCM Care Management Expected Outcome:  Monitor, Self-Manage and Reduce Symptoms of: HLD       Current Barriers:  Chronic Disease Management support and education needs related to effective management of HLD BP Readings from Last 3 Encounters:  07/01/22 134/62  03/27/22 118/70  01/07/22 (!) 146/80    Lab Results  Component Value Date   CHOL 136 07/01/2022   CHOL 150 01/07/2022   CHOL 229 (H) 10/15/2021   Lab Results  Component Value Date   HDL 49 01/07/2022   HDL 49 10/15/2021   HDL 51 06/20/2021   Lab Results  Component Value Date   LDLCALC 69 07/01/2022   LDLCALC 83 01/07/2022   LDLCALC 152 (H) 10/15/2021   Lab Results  Component Value Date   TRIG 86 01/07/2022   TRIG 153 (H) 10/15/2021   TRIG 177 (H) 06/20/2021   Lab Results  Component Value Date   CHOLHDL 2.8 07/01/2022    CHOLHDL 3.1 01/07/2022   CHOLHDL 4.7 10/15/2021     Planned Interventions: Provider established cholesterol goals reviewed.Review of goals. Review of goals and the patient is at goal currently. Review of making sure he is taking his medications and following the plan of care established by the provider. The patient is doing well with staying at goal with cholesterol readings. He also is having more stable blood pressures. He admits he has not been taking his blood pressures like he should but he is going to start back. Education and support given.  ; Counseled on importance of regular laboratory monitoring as prescribed. Has lab work done on a regular basis. Most recent in May. Provided HLD educational materials. Education and support given; Reviewed role and benefits of statin for ASCVD risk reduction; The patient has not been taking his blood pressures recently due  to moving into an apartment. Education and support given.  Discussed strategies to manage statin-induced myalgias; Reviewed importance of limiting foods high in cholesterol. Review of heart healthy/ADA diet-He is trying to be more mindful of foods that are not good for him. Sweets is his weakness, is doing better with managing his dietary restrictions. Will continue to monitor for changes; Reviewed exercise goals and target of 150 minutes per week. Patient goes to the gym and works out on a regular basis. Usually works out one hour and 15 minutes. He states he feels a lot better when he goes to the gym regularly. He says that lately has not been motivated and has not been going as much. He has been a little down due to having to move out of his house. He wanted to keep his house. Allowed the patient to express his thoughts and feelings and talk about things going on. Encouraged the patient to set small goals and work toward those goals. The patient states that he is going to work on getting into a better routine. Education and support  given; Screening for signs and symptoms of depression related to chronic disease state;  Assessed social determinant of health barriers;    Symptom Management: Take medications as prescribed   Attend all scheduled provider appointments Call provider office for new concerns or questions  call the Suicide and Crisis Lifeline: 988 call the [place] National Suicide Prevention Lifeline: 902-312-0385 or TTY: (330)761-5705 TTY 618-203-8572) to talk to a trained counselor call 1-800-273-TALK (toll free, 24 hour hotline) if experiencing a Mental Health or Behavioral Health Crisis  - call for medicine refill 2 or 3 days before it runs out - take all medications exactly as prescribed - call doctor with any symptoms you believe are related to your medicine - call doctor when you experience any new symptoms - go to all doctor appointments as scheduled - adhere to prescribed diet: heart healthy/ADA diet   Follow Up Plan: Telephone follow up appointment with care management team member scheduled for: 03-04-2023 at 100 pm           Our next appointment is by telephone on 03-04-2023 at 1 pm  Please call the care guide team at [phone] if you need to cancel or reschedule your appointment.   If you are experiencing a Mental Health or Behavioral Health Crisis or need someone to talk to, please call the Suicide and Crisis Lifeline: 988 call the [place] National Suicide Prevention Lifeline: 551 594 4781 or TTY: 5187635915 TTY 321-595-2870) to talk to a trained counselor call 1-800-273-TALK (toll free, 24 hour hotline)   Patient verbalizes understanding of instructions and care plan provided today and agrees to view in MyChart. Active MyChart status and patient understanding of how to access instructions and care plan via MyChart confirmed with patient.       Alto Denver RN, MSN, CCM RN Care Manager  [facility]  Ambulatory Care Management  Direct Number: [phone]

## 2022-12-31 NOTE — Patient Outreach (Signed)
Care Management   Visit Note  12/31/2022 Name: Daniel Bradley. MRN: [id number] DOB: 25-Mar-1955  Subjective: Daniel Bradley. is a 67 y.o. year old male who is a primary care patient of [name], NP. The Care Management team was consulted for assistance.      Engaged with patient spoke with patient by telephone.    Goals Addressed             This Visit's Progress    RNCM Care Management Expected Outcome:  Monitor, Self-Manage and Reduce Symptoms of Diabetes       Current Barriers:  Knowledge Deficits related to the significance of controlling blood sugars and keeping A1C levels down Care Coordination needs related to medication cost constraints  in a patient with DM Chronic Disease Management support and education needs related to effective management of DM Financial Constraints.  Lab Results  Component Value Date   HGBA1C 6.9 07/01/2022     Planned Interventions: Provided education to patient about basic DM disease process. Review and education provided. The patient is doing well and has improved his A1C level to at goal.  Continues to work with pharm D. The patient is getting ready to move in his house that he bought and he is happy about this. He has been a little down because of all the things he has been through this past year.  Allowed the patient to reflect on his life and things that have been bothering him. He states that he is ready to be in his new house and being able to mow his yard and do all kinds do things outside. This is the area he grew up in and he is excited about this. He will be able to move on 01-26-2023. Reviewed medications with patient and discussed importance of medication adherence. The patient is compliant with medications. The patient has been approved for Rybelsus and is thankful for this. This is a medication that helped him a lot in the past. Review of the notes and the patient will be able to pick up the medications at the office when  they are in. The patient continues to work with pharm D on a regular basis.  Reviewed prescribed diet with patient heart healthy/ADA diet.  He admits sweets are his weak points. Education provided on eating balanced meals and monitoring for abnormal readings. The patient is being more mindful of his eating habits. States he is eating better and he is watching what he eats. Encouraged the patient to continue with good eating habits and not to get hooked on junk foods and fast foods now that he is living on his own. Education and support provided.  Counseled on importance of regular laboratory monitoring as prescribed. Has regular lab work done;        Discussed plans with patient for ongoing care management follow up and provided patient with direct contact information for care management team;      Provided patient with written educational materials related to hypo and hyperglycemia and importance of correct treatment.  The patient denies any sx and sx of hypo or hyperglycemia. Denies any sx and sx of hypo or hyperglycemia. Review with the patient. The patient admits he has not been taking his blood sugars but he is going to get back on track.  Reviewed scheduled/upcoming provider appointments including:  01-12-2023 at 840 am Advised patient, providing education and rationale, to check cbg twice daily and when you have symptoms of low or high blood  sugar and record. The patient is taking his blood sugars. Review of the goal of fasting is <130 and post prandial of <180.  Encouraged the patient to start back taking his blood sugars so he will know when his levels are changing.  Call provider for findings outside established parameters;       Referral made to pharmacy team for assistance with assistance with cost constraints related to Rybelsus and inability to pay for refills. The patient is working with the pharm D for medication cost constraints. The patient has been approved for Rybelsus and will be covered  through 2024. Continues to work with the pharm D on a regular basis Review of patient status, including review of consultants reports, relevant laboratory and other test results, and medications completed;       Advised patient to discuss changes in DM health and well being with provider;      Screening for signs and symptoms of depression related to chronic disease state;        Assessed social determinant of health barriers;         Symptom Management: Take medications as prescribed   Attend all scheduled provider appointments Call provider office for new concerns or questions  call the Suicide and Crisis Lifeline: 988 call the [place] National Suicide Prevention Lifeline: (340)232-2128 or TTY: 623-421-5617 TTY (442)242-6767) to talk to a trained counselor call 1-800-273-TALK (toll free, 24 hour hotline) if experiencing a Mental Health or Behavioral Health Crisis  check feet daily for cuts, sores or redness trim toenails straight across manage portion size wash and dry feet carefully every day wear comfortable, cotton socks wear comfortable, well-fitting shoes  Follow Up Plan: Telephone follow up appointment with care management team member scheduled for: 03-04-2023 at 100 pm       RNCM Care Management Expected Outcome:  Monitor, Self-Manage and Reduce Symptoms of: HLD       Current Barriers:  Chronic Disease Management support and education needs related to effective management of HLD BP Readings from Last 3 Encounters:  07/01/22 134/62  03/27/22 118/70  01/07/22 (!) 146/80    Lab Results  Component Value Date   CHOL 136 07/01/2022   CHOL 150 01/07/2022   CHOL 229 (H) 10/15/2021   Lab Results  Component Value Date   HDL 49 01/07/2022   HDL 49 10/15/2021   HDL 51 06/20/2021   Lab Results  Component Value Date   LDLCALC 69 07/01/2022   LDLCALC 83 01/07/2022   LDLCALC 152 (H) 10/15/2021   Lab Results  Component Value Date   TRIG 86 01/07/2022   TRIG 153 (H) 10/15/2021    TRIG 177 (H) 06/20/2021   Lab Results  Component Value Date   CHOLHDL 2.8 07/01/2022   CHOLHDL 3.1 01/07/2022   CHOLHDL 4.7 10/15/2021     Planned Interventions: Provider established cholesterol goals reviewed.Review of goals. Review of goals and the patient is at goal currently. Review of making sure he is taking his medications and following the plan of care established by the provider. The patient is doing well with staying at goal with cholesterol readings. He also is having more stable blood pressures. He admits he has not been taking his blood pressures like he should but he is going to start back. Education and support given.  ; Counseled on importance of regular laboratory monitoring as prescribed. Has lab work done on a regular basis. Most recent in May. Provided HLD educational materials. Education and support given; Reviewed  role and benefits of statin for ASCVD risk reduction; The patient has not been taking his blood pressures recently due to moving into an apartment. Education and support given.  Discussed strategies to manage statin-induced myalgias; Reviewed importance of limiting foods high in cholesterol. Review of heart healthy/ADA diet-He is trying to be more mindful of foods that are not good for him. Sweets is his weakness, is doing better with managing his dietary restrictions. Will continue to monitor for changes; Reviewed exercise goals and target of 150 minutes per week. Patient goes to the gym and works out on a regular basis. Usually works out one hour and 15 minutes. He states he feels a lot better when he goes to the gym regularly. He says that lately has not been motivated and has not been going as much. He has been a little down due to having to move out of his house. He wanted to keep his house. Allowed the patient to express his thoughts and feelings and talk about things going on. Encouraged the patient to set small goals and work toward those goals. The patient  states that he is going to work on getting into a better routine. Education and support given; Screening for signs and symptoms of depression related to chronic disease state;  Assessed social determinant of health barriers;    Symptom Management: Take medications as prescribed   Attend all scheduled provider appointments Call provider office for new concerns or questions  call the Suicide and Crisis Lifeline: 988 call the [place] National Suicide Prevention Lifeline: 408-169-2742 or TTY: 9863892497 TTY 207-306-5502) to talk to a trained counselor call 1-800-273-TALK (toll free, 24 hour hotline) if experiencing a Mental Health or Behavioral Health Crisis  - call for medicine refill 2 or 3 days before it runs out - take all medications exactly as prescribed - call doctor with any symptoms you believe are related to your medicine - call doctor when you experience any new symptoms - go to all doctor appointments as scheduled - adhere to prescribed diet: heart healthy/ADA diet   Follow Up Plan: Telephone follow up appointment with care management team member scheduled for: 03-04-2023 at 100 pm            Consent to Services:  Patient was given information about care management services, agreed to services, and gave verbal consent to participate.   Plan: Telephone follow up appointment with care management team member scheduled for: 03-04-2023 at 1 pm  Alto Denver RN, MSN, CCM RN Care Manager  [facility]  Ambulatory Care Management  Direct Number: [phone]

## 2023-01-12 ENCOUNTER — Encounter: Payer: Self-pay | Admitting: Internal Medicine

## 2023-01-12 ENCOUNTER — Ambulatory Visit (INDEPENDENT_AMBULATORY_CARE_PROVIDER_SITE_OTHER): Payer: Medicare HMO | Admitting: Internal Medicine

## 2023-01-12 VITALS — BP 128/74 | Ht 73.0 in | Wt 281.4 lb

## 2023-01-12 DIAGNOSIS — Z0001 Encounter for general adult medical examination with abnormal findings: Secondary | ICD-10-CM | POA: Diagnosis not present

## 2023-01-12 DIAGNOSIS — E1169 Type 2 diabetes mellitus with other specified complication: Secondary | ICD-10-CM

## 2023-01-12 DIAGNOSIS — E66812 Obesity, class 2: Secondary | ICD-10-CM | POA: Diagnosis not present

## 2023-01-12 DIAGNOSIS — Z6837 Body mass index (BMI) 37.0-37.9, adult: Secondary | ICD-10-CM | POA: Diagnosis not present

## 2023-01-12 DIAGNOSIS — Z23 Encounter for immunization: Secondary | ICD-10-CM

## 2023-01-12 LAB — POCT GLYCOSYLATED HEMOGLOBIN (HGB A1C): Hemoglobin A1C: 7.4 % — AB (ref 4.0–5.6)

## 2023-01-12 NOTE — Patient Instructions (Signed)
Health Maintenance After Age 67 After age 67, you are at a higher risk for certain long-term diseases and infections as well as injuries from falls. Falls are a major cause of broken bones and head injuries in people who are older than age 67. Getting regular preventive care can help to keep you healthy and well. Preventive care includes getting regular testing and making lifestyle changes as recommended by your health care provider. Talk with your health care provider about: Which screenings and tests you should have. A screening is a test that checks for a disease when you have no symptoms. A diet and exercise plan that is right for you. What should I know about screenings and tests to prevent falls? Screening and testing are the best ways to find a health problem early. Early diagnosis and treatment give you the best chance of managing medical conditions that are common after age 67. Certain conditions and lifestyle choices may make you more likely to have a fall. Your health care provider may recommend: Regular vision checks. Poor vision and conditions such as cataracts can make you more likely to have a fall. If you wear glasses, make sure to get your prescription updated if your vision changes. Medicine review. Work with your health care provider to regularly review all of the medicines you are taking, including over-the-counter medicines. Ask your health care provider about any side effects that may make you more likely to have a fall. Tell your health care provider if any medicines that you take make you feel dizzy or sleepy. Strength and balance checks. Your health care provider may recommend certain tests to check your strength and balance while standing, walking, or changing positions. Foot health exam. Foot pain and numbness, as well as not wearing proper footwear, can make you more likely to have a fall. Screenings, including: Osteoporosis screening. Osteoporosis is a condition that causes  the bones to get weaker and break more easily. Blood pressure screening. Blood pressure changes and medicines to control blood pressure can make you feel dizzy. Depression screening. You may be more likely to have a fall if you have a fear of falling, feel depressed, or feel unable to do activities that you used to do. Alcohol use screening. Using too much alcohol can affect your balance and may make you more likely to have a fall. Follow these instructions at home: Lifestyle Do not drink alcohol if: Your health care provider tells you not to drink. If you drink alcohol: Limit how much you have to: 0-1 drink a day for women. 0-2 drinks a day for men. Know how much alcohol is in your drink. In the [place] one drink equals one 12 oz bottle of beer (355 mL), one 5 oz glass of wine (148 mL), or one 1 oz glass of hard liquor (44 mL). Do not use any products that contain nicotine or tobacco. These products include cigarettes, chewing tobacco, and vaping devices, such as e-cigarettes. If you need help quitting, ask your health care provider. Activity  Follow a regular exercise program to stay fit. This will help you maintain your balance. Ask your health care provider what types of exercise are appropriate for you. If you need a cane or walker, use it as recommended by your health care provider. Wear supportive shoes that have nonskid soles. Safety  Remove any tripping hazards, such as rugs, cords, and clutter. Install safety equipment such as grab bars in bathrooms and safety rails on stairs. Keep rooms and walkways   well-lit. General instructions Talk with your health care provider about your risks for falling. Tell your health care provider if: You fall. Be sure to tell your health care provider about all falls, even ones that seem minor. You feel dizzy, tiredness (fatigue), or off-balance. Take over-the-counter and prescription medicines only as told by your health care provider. These include  supplements. Eat a healthy diet and maintain a healthy weight. A healthy diet includes low-fat dairy products, low-fat (lean) meats, and fiber from whole grains, beans, and lots of fruits and vegetables. Stay current with your vaccines. Schedule regular health, dental, and eye exams. Summary Having a healthy lifestyle and getting preventive care can help to protect your health and wellness after age 67. Screening and testing are the best way to find a health problem early and help you avoid having a fall. Early diagnosis and treatment give you the best chance for managing medical conditions that are more common for people who are older than age 67. Falls are a major cause of broken bones and head injuries in people who are older than age 67. Take precautions to prevent a fall at home. Work with your health care provider to learn what changes you can make to improve your health and wellness and to prevent falls. This information is not intended to replace advice given to you by your health care provider. Make sure you discuss any questions you have with your health care provider. Document Revised: 06/18/2020 Document Reviewed: 06/18/2020 [employer] Patient Education  2024 [employer]  

## 2023-01-12 NOTE — Progress Notes (Signed)
Subjective:    Patient ID: Daniel Fetch., male    DOB: September 30, 1955, 67 y.o.   MRN: [id number]  HPI  Patient presents to clinic today for his annual exam.    Flu: 10/2021 Tetanus: 12/2020 COVID: X3 Pneumovax: Never Prevnar 20: 12/2020 Shingrix: Never PSA screening: 11/2022 Colon screening: 09/2021 Vision screening: Annually Dentist: biannually  Diet: He does eat meat. He consumes fruits and veggies. He tries to avoid fried foods. He drinks mostly Dt. 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  DX: E11.9 100 each 1   Alcohol Swabs (DROPSAFE ALCOHOL PREP) 70 % PADS USE AS DIRECTED TO CHECK BLOOD SUGAR ONCE A DAY 100 each 3   amLODipine (NORVASC) 10 MG tablet TAKE 1 TABLET EVERY DAY 90 tablet 0   aspirin EC 81 MG tablet 81 mg daily. Swallow whole.     blood glucose meter kit and supplies Dispense based on patient and insurance preference. Use to check blood sugar daily as directed. DX Ell.9 1 each 0   Blood Glucose Monitoring Suppl (ACCU-CHEK GUIDE) w/Device KIT Use to check blood sugar daily for type 2 diabetes E11.9 1 kit 0    Cholecalciferol 125 MCG (5000 UT) capsule Take 5,000 Units by mouth daily.     citalopram (CELEXA) 10 MG tablet Take 1 tablet (10 mg total) by mouth daily. (Patient not taking: Reported on 09/11/2022) 90 tablet 1   doxylamine, Sleep, (UNISOM) 25 MG tablet Take 25 mg by mouth at bedtime as needed.     glipiZIDE (GLUCOTROL) 10 MG tablet TAKE 1 TABLET TWICE DAILY BEFORE MEALS 180 tablet 0   glucose blood (ACCU-CHEK GUIDE) test strip Use to check blood sugar once a day.  DX: E11.9 100 strip 0   isosorbide mononitrate (IMDUR) 30 MG 24 hr tablet Take 1 tablet (30 mg total) by mouth once daily     losartan (COZAAR) 25 MG tablet Take 1 tablet by mouth daily.     metFORMIN (GLUCOPHAGE) 850 MG tablet Take 1 tablet (850 mg total) by mouth 2 (two) times daily with a meal. 180 tablet 1   metoprolol tartrate (LOPRESSOR) 50 MG tablet Take 1 tablet by mouth 2 (two) times daily.     rosuvastatin (CRESTOR) 20 MG tablet TAKE 1 TABLET EVERY DAY 90 tablet 0   Semaglutide 7 MG TABS Take 7 mg by mouth daily.     No current facility-administered medications for this visit.    No Known Allergies  Family History  Problem Relation Age of  Onset   Alcoholism Mother    Alcoholism Father    Pancreatic cancer Brother    Prostate cancer Neg Hx    Kidney disease Neg Hx    Cancer - Colon Neg Hx     Social History   Socioeconomic History   Marital status: Single    Spouse name: Not on file   Number of children: 2   Years of education: Not on file   Highest education level: Not on file  Occupational History   Occupation: [employer]AT[employer]    Employer: [employer]    Comment: retired  Tobacco Use   Smoking status: Never   Smokeless tobacco: Never  Vaping Use   Vaping status: Never Used  Substance and Sexual Activity   Alcohol use: No    Alcohol/week: 0.0 standard drinks of alcohol    Comment: rare   Drug use: No   Sexual activity: Not Currently  Other Topics Concern   Not on file  Social History  Narrative   Lives with girlfriend, April   Social Determinants of Health   Financial Resource Strain: Low Risk  (09/11/2022)   Overall Financial Resource Strain (CARDIA)    Difficulty of Paying Living Expenses: Not hard at all  Food Insecurity: No Food Insecurity (09/11/2022)   Hunger Vital Sign    Worried About Running Out of Food in the Last Year: Never true    Ran Out of Food in the Last Year: Never true  Transportation Needs: No Transportation Needs (09/11/2022)   PRAPARE - Administrator, Civil Service (Medical): No    Lack of Transportation (Non-Medical): No  Physical Activity: Inactive (09/11/2022)   Exercise Vital Sign    Days of Exercise per Week: 0 days    Minutes of Exercise per Session: 0 min  Stress: Stress Concern Present (09/11/2022)   [employer] of Occupational Health - Occupational Stress Questionnaire    Feeling of Stress : To some extent  Social Connections: Unknown (09/11/2022)   Social Connection and Isolation Panel [NHANES]    Frequency of Communication with Friends and Family: Twice a week    Frequency of Social Gatherings with Friends and Family: Not on file    Attends Religious Services: Never    Database administrator or Organizations: No    Attends Banker Meetings: Never    Marital Status: Divorced  Recent Concern: Social Connections - Socially Isolated (09/11/2022)   Social Connection and Isolation Panel [NHANES]    Frequency of Communication with Friends and Family: Twice a week    Frequency of Social Gatherings with Friends and Family: Once a week    Attends Religious Services: Never    Database administrator or Organizations: No    Attends Banker Meetings: Never    Marital Status: Divorced  Catering manager Violence: Not At Risk (09/11/2022)   Humiliation, Afraid, Rape, and Kick questionnaire    Fear of Current or Ex-Partner: No    Emotionally Abused: No    Physically Abused: No    Sexually Abused: No      Constitutional: Denies fever, malaise, fatigue, headache or abrupt weight changes.  HEENT: Denies eye pain, eye redness, ear pain, ringing in the ears, wax buildup, runny nose, nasal congestion, bloody nose, or sore throat. Respiratory: Denies difficulty breathing, shortness of breath, cough or sputum production.   Cardiovascular: Denies chest pain, chest tightness, palpitations or swelling in the hands or feet.  Gastrointestinal: Denies abdominal  pain, bloating, constipation, diarrhea or blood in the stool.  GU: Patient has a history of erectile dysfunction.  Denies urgency, frequency, pain with urination, burning sensation, blood in urine, odor or discharge. Musculoskeletal: Patient reports joint pain, mainly in back.  Denies decrease in range of motion, difficulty with gait, muscle pain or joint swelling.  Skin: Denies redness, rashes, lesions or ulcercations.  Neurological: Denies dizziness, difficulty with memory, difficulty with speech or problems with balance and coordination.  Psych: Patient has a history of depression.  Denies anxiety, SI/HI.  No other specific complaints in a complete review of systems (except as listed in HPI above).  Objective:   Physical Exam  BP 128/74   Ht 6\' 1"  (1.854 m)   Wt 281 lb 6.4 oz (127.6 kg)   BMI 37.13 kg/m    Wt Readings from Last 3 Encounters:  09/11/22 273 lb (123.8 kg)  07/01/22 273 lb (123.8 kg)  03/27/22 285 lb (129.3 kg)    General: Appears his stated age, obese, in NAD. Skin: Warm, dry and intact. No ulcerations noted. HEENT: Head: normal shape and size; Eyes: sclera white, no icterus, conjunctiva pink, PERRLA and EOMs intact;  Neck:  Neck supple, trachea midline. No masses, lumps or thyromegaly present.  Cardiovascular: Normal rate and rhythm. S1,S2 noted.  No murmur, rubs or gallops noted. No JVD or BLE edema. No carotid bruits noted. Pulmonary/Chest: Normal effort and positive vesicular breath sounds. No respiratory  distress. No wheezes, rales or ronchi noted.  Abdomen: Soft and nontender. Normal bowel sounds.  Musculoskeletal: Strength 5/5 BUE/BLE.  No difficulty with gait.  Neurological: Alert and oriented. Cranial nerves II-XII grossly intact. Coordination normal.  Psychiatric: Mood and affect normal. Behavior is normal. Judgment and thought content normal.    BMET    Component Value Date/Time   NA 142 07/01/2022 1550   NA 139 03/30/2020 1015   NA 139 04/21/2012 0641   K 4.7 07/01/2022 1550   K 3.8 04/21/2012 0641   CL 105 07/01/2022 1550   CL 107 04/21/2012 0641   CO2 28 07/01/2022 1550   CO2 27 04/21/2012 0641   GLUCOSE 90 07/01/2022 1550   GLUCOSE 212 (H) 04/21/2012 0641   BUN 16 07/01/2022 1550   BUN 14 03/30/2020 1015   BUN 22 (H) 04/21/2012 0641   CREATININE 1.08 07/01/2022 1550   CALCIUM 10.3 07/01/2022 1550   CALCIUM 8.7 04/21/2012 0641   GFRNONAA >60 06/23/2021 2133   GFRNONAA 51 (L) 04/21/2012 0641   GFRAA 100 03/30/2020 1015   GFRAA 59 (L) 04/21/2012 0641    Lipid Panel     Component Value Date/Time   CHOL 136 07/01/2022 1550   CHOL 128 03/30/2020 1015   TRIG 108 07/01/2022 1550   HDL 48 07/01/2022 1550   HDL 41 03/30/2020 1015   CHOLHDL 2.8 07/01/2022 1550   VLDL 14 08/20/2017 0359   LDLCALC 69 07/01/2022 1550    CBC    Component Value Date/Time   WBC 6.7 07/01/2022 1550   RBC 5.61 07/01/2022 1550   HGB 14.0 07/01/2022 1550   HGB 14.0 03/30/2020 1015   HCT 44.5 07/01/2022 1550   HCT 42.5 03/30/2020 1015   PLT 332 07/01/2022 1550   PLT 306 03/30/2020 1015   MCV 79.3 (L) 07/01/2022 1550   MCV 79 03/30/2020 1015   MCV 80 04/21/2012 0641   MCH 25.0 (L) 07/01/2022 1550   MCHC 31.5 (L) 07/01/2022 1550   RDW 14.5 07/01/2022 1550  RDW 14.6 03/30/2020 1015   RDW 14.9 (H) 04/21/2012 0641   LYMPHSABS 1.8 03/30/2020 1015   MONOABS 0.5 04/03/2019 0757   EOSABS 0.3 03/30/2020 1015   BASOSABS 0.1 03/30/2020 1015    Hgb A1C Lab Results  Component Value  Date   HGBA1C 6.9 07/01/2022            Assessment & Plan:   Preventative Health Maintenance:  Flu shot today Tetanus UTD Encouraged him to get his COVID booster Prevnar 20 UTD, will not need Pneumovax Discussed Shingrix vaccine, he will check coverage with his insurance company and schedule a visit at the pharmacy if he would like to have this done Colon screening UTD Encouraged him to consume a balanced diet and exercise regimen Advised him to see an eye doctor and dentist annually We will check CBC, c-Met, lipid, A1c, and urine microalbumin today  RTC in 6 months follow-up chronic conditions Nicki Reaper, NP

## 2023-01-12 NOTE — Assessment & Plan Note (Signed)
Encourage diet and exercise for weight loss 

## 2023-01-13 LAB — LIPID PANEL
Cholesterol: 142 mg/dL (ref <200)
HDL: 47 mg/dL (ref >=40)
LDL Cholesterol (Calc): 79 mg/dL
Non-HDL Cholesterol (Calc): 95 mg/dL (ref <130)
Total CHOL/HDL Ratio: 3 (calc) (ref <5.0)
Triglycerides: 82 mg/dL (ref <150)

## 2023-01-13 LAB — COMPLETE METABOLIC PANEL WITH GFR
AG Ratio: 1.6 (calc) (ref 1.0–2.5)
ALT: 14 U/L (ref 9–46)
AST: 12 U/L (ref 10–35)
Albumin: 4.2 g/dL (ref 3.6–5.1)
Alkaline phosphatase (APISO): 87 U/L (ref 35–144)
BUN: 15 mg/dL (ref 7–25)
CO2: 26 mmol/L (ref 20–32)
Calcium: 9.9 mg/dL (ref 8.6–10.3)
Chloride: 104 mmol/L (ref 98–110)
Creat: 0.99 mg/dL (ref 0.70–1.35)
Globulin: 2.7 g/dL (ref 1.9–3.7)
Glucose, Bld: 146 mg/dL — ABNORMAL HIGH (ref 65–99)
Potassium: 4.9 mmol/L (ref 3.5–5.3)
Sodium: 138 mmol/L (ref 135–146)
Total Bilirubin: 0.4 mg/dL (ref 0.2–1.2)
Total Protein: 6.9 g/dL (ref 6.1–8.1)
eGFR: 83 mL/min/{1.73_m2} (ref >=60)

## 2023-01-13 LAB — CBC
HCT: 43.8 % (ref 38.5–50.0)
Hemoglobin: 13.7 g/dL (ref 13.2–17.1)
MCH: 25 pg — ABNORMAL LOW (ref 27.0–33.0)
MCHC: 31.3 g/dL — ABNORMAL LOW (ref 32.0–36.0)
MCV: 80.1 fL (ref 80.0–100.0)
MPV: 10.7 fL (ref 7.5–12.5)
Platelets: 305 10*3/uL (ref 140–400)
RBC: 5.47 10*6/uL (ref 4.20–5.80)
RDW: 14.5 % (ref 11.0–15.0)
WBC: 6.6 10*3/uL (ref 3.8–10.8)

## 2023-01-13 LAB — MICROALBUMIN / CREATININE URINE RATIO
Creatinine, Urine: 184 mg/dL (ref 20–320)
Microalb Creat Ratio: 11 mg/g{creat} (ref <30)
Microalb, Ur: 2.1 mg/dL

## 2023-01-15 ENCOUNTER — Other Ambulatory Visit: Payer: Self-pay | Admitting: Pharmacy Technician

## 2023-01-15 DIAGNOSIS — Z5986 Financial insecurity: Secondary | ICD-10-CM

## 2023-01-15 NOTE — Progress Notes (Addendum)
Pharmacy Medication Assistance Program Note    01/15/2023  Patient ID: Daniel Felkins., male   DOB: [birth year], 67 y.o.   MRN: [id number]     01/15/2023  Outreach Medication One  Initial Outreach Date (Medication One) 01/14/2023  Manufacturer Medication One [employer] [facility] Rybelsus  Dose of Rybelsus 7mg   Type of Radiographer, therapeutic Assistance  Date Application Sent to Patient 01/14/2023  Application Items Requested Application;Proof of Income;Other  Date Application Sent to Prescriber 01/21/2023  Name of Prescriber Daniel Bradley      [facility] 03/11/2023 Remailed application to [facility] as requested by [facility] PharmD Estelle Grumbles.  ADDENDUM 03/24/2023 Unsuccessful outreach to patient in regard to [employer] application for Rybelsus. HIPAA compliant v/m was left requesting a return call  Was calling to inquire if patient received the application that was mailed to him on 01/14/23 and again on 03/11/2023.Daniel Bradley, CPhT [place]  Office: [phone] Fax: [phone] Email: [email or web address]

## 2023-01-21 ENCOUNTER — Other Ambulatory Visit: Payer: Self-pay | Admitting: Pharmacist

## 2023-01-21 NOTE — Progress Notes (Addendum)
   01/21/2023  Patient ID: Daniel Fetch., male   DOB: 12-06-1955, 67 y.o.   MRN: [id number]  Receive message from CPhT Noreene Larsson Simcox requesting confirmation of patient's address as the patient assistance application that was mailed to him has been returned.  Outreach to patient today who confirms that the previous address used was correct. However, patient advises that as of 01/26/2023, he will have moved to a new address. Update patient address in chart accordingly  New address provided:  [address]  [place], [place] [address]  Will collaborate with CPhT to request that she re-mail application to new address.  Estelle Grumbles, PharmD, [facility] [phone]

## 2023-01-23 ENCOUNTER — Other Ambulatory Visit: Payer: Self-pay | Admitting: Internal Medicine

## 2023-01-23 DIAGNOSIS — E119 Type 2 diabetes mellitus without complications: Secondary | ICD-10-CM

## 2023-01-23 NOTE — Telephone Encounter (Signed)
Requested by interface surescripts. Future visit in 5 months.  Requested Prescriptions  Pending Prescriptions Disp Refills   metFORMIN (GLUCOPHAGE) 850 MG tablet [Pharmacy Med Name: metFORMIN HCl Oral Tablet 850 MG] 180 tablet 2    Sig: TAKE 1 TABLET TWICE DAILY WITH MEALS     Endocrinology:  Diabetes - Biguanides Failed - 01/23/2023 12:57 PM      Failed - B12 Level in normal range and within 720 days    No results found for: "VITAMINB12"       Failed - CBC within normal limits and completed in the last 12 months    WBC  Date Value Ref Range Status  01/12/2023 6.6 3.8 - 10.8 Thousand/uL Final   RBC  Date Value Ref Range Status  01/12/2023 5.47 4.20 - 5.80 Million/uL Final   Hemoglobin  Date Value Ref Range Status  01/12/2023 13.7 13.2 - 17.1 g/dL Final  16/11/9602 54.0 13.0 - 17.7 g/dL Final   HCT  Date Value Ref Range Status  01/12/2023 43.8 38.5 - 50.0 % Final   Hematocrit  Date Value Ref Range Status  03/30/2020 42.5 37.5 - 51.0 % Final   MCHC  Date Value Ref Range Status  01/12/2023 31.3 (L) 32.0 - 36.0 g/dL Final    Comment:    For adults, a slight decrease in the calculated MCHC value (in the range of 30 to 32 g/dL) is most likely not clinically significant; however, it should be interpreted with caution in correlation with other red cell parameters and the patient's clinical condition.    [facility]  Date Value Ref Range Status  01/12/2023 25.0 (L) 27.0 - 33.0 pg Final   MCV  Date Value Ref Range Status  01/12/2023 80.1 80.0 - 100.0 fL Final  03/30/2020 79 79 - 97 fL Final  04/21/2012 80 80 - 100 fL Final   No results found for: "PLTCOUNTKUC", "LABPLAT", "POCPLA" RDW  Date Value Ref Range Status  01/12/2023 14.5 11.0 - 15.0 % Final  03/30/2020 14.6 11.6 - 15.4 % Final  04/21/2012 14.9 (H) 11.5 - 14.5 % Final         Passed - Cr in normal range and within 360 days    Creat  Date Value Ref Range Status  01/12/2023 0.99 0.70 - 1.35 mg/dL Final    Creatinine, Urine  Date Value Ref Range Status  01/12/2023 184 20 - 320 mg/dL Final         Passed - HBA1C is between 0 and 7.9 and within 180 days    Hemoglobin A1C  Date Value Ref Range Status  01/12/2023 7.4 (A) 4.0 - 5.6 % Final   HbA1c, POC (controlled diabetic range)  Date Value Ref Range Status  07/01/2022 6.9 0.0 - 7.0 % Final         Passed - eGFR in normal range and within 360 days    EGFR (African American)  Date Value Ref Range Status  04/21/2012 59 (L)  Final   GFR calc Af Amer  Date Value Ref Range Status  03/30/2020 100 >59 mL/min/1.73 Final    Comment:    **In accordance with recommendations from the NKF-ASN Task force,**   [facility] is in the process of updating its eGFR calculation to the   2021 CKD-EPI creatinine equation that estimates kidney function   without a race variable.    EGFR (Non-African Amer.)  Date Value Ref Range Status  04/21/2012 51 (L)  Final    Comment:  eGFR values <3mL/min/1.73 m2 may be an indication of chronic kidney disease (CKD). Calculated eGFR is useful in patients with stable renal function. The eGFR calculation will not be reliable in acutely ill patients when serum creatinine is changing rapidly. It is not useful in  patients on dialysis. The eGFR calculation may not be applicable to patients at the low and high extremes of body sizes, pregnant women, and vegetarians.    GFR, Estimated  Date Value Ref Range Status  06/23/2021 >60 >60 mL/min Final    Comment:    (NOTE) Calculated using the CKD-EPI Creatinine Equation (2021)    eGFR  Date Value Ref Range Status  01/12/2023 83 > OR = 60 mL/min/1.67m2 Final         Passed - Valid encounter within last 6 months    Recent Outpatient Visits           1 week ago Encounter for general adult medical examination with abnormal findings   [place] [facility] [place], [name], NP   3 weeks ago Type 2 diabetes mellitus with other specified  complication, without long-term current use of insulin [facility])   [place] [facility] [place], Gentry Fitz A, RPH-CPP   2 months ago Type 2 diabetes mellitus with other specified complication, without long-term current use of insulin (HCC)   [place] [facility] [place], Gentry Fitz A, RPH-CPP   6 months ago Class 2 severe obesity due to excess calories with serious comorbidity and body mass index (BMI) of 36.0 to 36.9 in adult [facility])   [facility] [place], [place] W, NP   10 months ago Type 2 diabetes mellitus without complication, without long-term current use of insulin [facility])   [place] [facility] [place], [name], NP       Future Appointments             In 5 months Ba[place], [name], NP [facility] [facility]

## 2023-02-12 ENCOUNTER — Other Ambulatory Visit: Payer: Self-pay | Admitting: Internal Medicine

## 2023-02-12 DIAGNOSIS — E119 Type 2 diabetes mellitus without complications: Secondary | ICD-10-CM

## 2023-02-16 NOTE — Telephone Encounter (Signed)
 Requested by interface surescripts. Last labs 01/12/23. Future visit in 4 months .  Requested Prescriptions  Pending Prescriptions Disp Refills   rosuvastatin  (CRESTOR ) 20 MG tablet [Pharmacy Med Name: ROSUVASTATIN  20MG  TABLETS] 90 tablet 0    Sig: TAKE 1 TABLET(20 MG) BY MOUTH DAILY     Cardiovascular:  Antilipid - Statins 2 Failed - 02/16/2023 12:27 PM      Failed - Lipid Panel in normal range within the last 12 months    Cholesterol, Total  Date Value Ref Range Status  03/30/2020 128 100 - 199 mg/dL Final   Cholesterol  Date Value Ref Range Status  01/12/2023 142 <200 mg/dL Final   LDL Cholesterol (Calc)  Date Value Ref Range Status  01/12/2023 79 mg/dL (calc) Final    Comment:    Reference range: <100 . Desirable range <100 mg/dL for primary prevention;   <70 mg/dL for patients with CHD or diabetic patients  with > or = 2 CHD risk factors. SABRA LDL-C is now calculated using the Martin-Hopkins  calculation, which is a validated novel method providing  better accuracy than the Friedewald equation in the  estimation of LDL-C.  Gladis APPLETHWAITE et al. SANDREA. 7986;689(80): 2061-2068  (http://education.[facility].com/faq/FAQ164)    HDL  Date Value Ref Range Status  01/12/2023 47 > OR = 40 mg/dL Final  97/81/7977 41 >60 mg/dL Final   Triglycerides  Date Value Ref Range Status  01/12/2023 82 <150 mg/dL Final         Passed - Cr in normal range and within 360 days    Creat  Date Value Ref Range Status  01/12/2023 0.99 0.70 - 1.35 mg/dL Final   Creatinine, Urine  Date Value Ref Range Status  01/12/2023 184 20 - 320 mg/dL Final         Passed - Patient is not pregnant      Passed - Valid encounter within last 12 months    Recent Outpatient Visits           1 month ago Encounter for general adult medical examination with abnormal findings   [place] [facility] [place], Angeline ORN, NP   1 month ago Type 2 diabetes mellitus with other specified  complication, without long-term current use of insulin  [facility])   [place] [facility] Delles, Sharyle A, RPH-CPP   3 months ago Type 2 diabetes mellitus with other specified complication, without long-term current use of insulin  [facility])   [place] [facility] Delles, Sharyle A, RPH-CPP   7 months ago Class 2 severe obesity due to excess calories with serious comorbidity and body mass index (BMI) of 36.0 to 36.9 in adult [facility])   [place] [facility] [place], [place] W, NP   10 months ago Type 2 diabetes mellitus without complication, without long-term current use of insulin  [facility])   [place] [facility] [place], Angeline ORN, NP       Future Appointments             In 4 months Baity, Angeline ORN, NP  [facility]             metFORMIN  (GLUCOPHAGE ) 850 MG tablet [Pharmacy Med Name: METFORMIN  850MG  TABLETS] 180 tablet 2    Sig: TAKE 1 TABLET(850 MG) BY MOUTH TWICE DAILY WITH A MEAL     Endocrinology:  Diabetes - Biguanides Failed - 02/16/2023 12:27 PM      Failed -  B12 Level in normal range and within 720 days    No results found for: VITAMINB12       Failed - CBC within normal limits and completed in the last 12 months    WBC  Date Value Ref Range Status  01/12/2023 6.6 3.8 - 10.8 Thousand/uL Final   RBC  Date Value Ref Range Status  01/12/2023 5.47 4.20 - 5.80 Million/uL Final   Hemoglobin  Date Value Ref Range Status  01/12/2023 13.7 13.2 - 17.1 g/dL Final  97/81/7977 85.9 13.0 - 17.7 g/dL Final   HCT  Date Value Ref Range Status  01/12/2023 43.8 38.5 - 50.0 % Final   Hematocrit  Date Value Ref Range Status  03/30/2020 42.5 37.5 - 51.0 % Final   MCHC  Date Value Ref Range Status  01/12/2023 31.3 (L) 32.0 - 36.0 g/dL Final    Comment:    For adults, a slight decrease in the calculated MCHC value (in the range of 30 to 32 g/dL) is most likely not clinically  significant; however, it should be interpreted with caution in correlation with other red cell parameters and the patient's clinical condition.    [facility]  Date Value Ref Range Status  01/12/2023 25.0 (L) 27.0 - 33.0 pg Final   MCV  Date Value Ref Range Status  01/12/2023 80.1 80.0 - 100.0 fL Final  03/30/2020 79 79 - 97 fL Final  04/21/2012 80 80 - 100 fL Final   No results found for: PLTCOUNTKUC, LABPLAT, POCPLA RDW  Date Value Ref Range Status  01/12/2023 14.5 11.0 - 15.0 % Final  03/30/2020 14.6 11.6 - 15.4 % Final  04/21/2012 14.9 (H) 11.5 - 14.5 % Final         Passed - Cr in normal range and within 360 days    Creat  Date Value Ref Range Status  01/12/2023 0.99 0.70 - 1.35 mg/dL Final   Creatinine, Urine  Date Value Ref Range Status  01/12/2023 184 20 - 320 mg/dL Final         Passed - HBA1C is between 0 and 7.9 and within 180 days    Hemoglobin A1C  Date Value Ref Range Status  01/12/2023 7.4 (A) 4.0 - 5.6 % Final   HbA1c, POC (controlled diabetic range)  Date Value Ref Range Status  07/01/2022 6.9 0.0 - 7.0 % Final         Passed - eGFR in normal range and within 360 days    EGFR (African American)  Date Value Ref Range Status  04/21/2012 59 (L)  Final   GFR calc Af Amer  Date Value Ref Range Status  03/30/2020 100 >59 mL/min/1.73 Final    Comment:    **In accordance with recommendations from the NKF-ASN Task force,**   [facility] is in the process of updating its eGFR calculation to the   2021 CKD-EPI creatinine equation that estimates kidney function   without a race variable.    EGFR (Non-African Amer.)  Date Value Ref Range Status  04/21/2012 51 (L)  Final    Comment:    eGFR values <22mL/min/1.73 m2 may be an indication of chronic kidney disease (CKD). Calculated eGFR is useful in patients with stable renal function. The eGFR calculation will not be reliable in acutely ill patients when serum creatinine is changing rapidly. It is not  useful in  patients on dialysis. The eGFR calculation may not be applicable to patients at the low and high extremes of body sizes,  pregnant women, and vegetarians.    GFR, Estimated  Date Value Ref Range Status  06/23/2021 >60 >60 mL/min Final    Comment:    (NOTE) Calculated using the CKD-EPI Creatinine Equation (2021)    eGFR  Date Value Ref Range Status  01/12/2023 83 > OR = 60 mL/min/1.69m2 Final         Passed - Valid encounter within last 6 months    Recent Outpatient Visits           1 month ago Encounter for general adult medical examination with abnormal findings   [place] [facility] [place], [place] W, NP   1 month ago Type 2 diabetes mellitus with other specified complication, without long-term current use of insulin  [facility])   [place] [facility] Delles, Sharyle A, RPH-CPP   3 months ago Type 2 diabetes mellitus with other specified complication, without long-term current use of insulin  [facility])   [place] [facility] Delles, Sharyle A, RPH-CPP   7 months ago Class 2 severe obesity due to excess calories with serious comorbidity and body mass index (BMI) of 36.0 to 36.9 in adult [facility])   [place] [facility] [place], [place] W, NP   10 months ago Type 2 diabetes mellitus without complication, without long-term current use of insulin  [facility])   [place] [facility] [place], Angeline ORN, NP       Future Appointments             In 4 months Baity, Angeline ORN, NP [facility]            Refused Prescriptions Disp Refills   citalopram  (CELEXA ) 10 MG tablet [Pharmacy Med Name: CITALOPRAM  10MG  TABLETS] 90 tablet 1    Sig: TAKE 1 TABLET(10 MG) BY MOUTH DAILY     Psychiatry:  Antidepressants - SSRI Passed - 02/16/2023 12:27 PM      Passed - Completed PHQ-2 or PHQ-9 in the last 360 days      Passed - Valid encounter within last 6 months    Recent  Outpatient Visits           1 month ago Encounter for general adult medical examination with abnormal findings   [place] [facility] [place], [place] W, NP   1 month ago Type 2 diabetes mellitus with other specified complication, without long-term current use of insulin  [facility])   [place] [facility] Delles, Sharyle A, RPH-CPP   3 months ago Type 2 diabetes mellitus with other specified complication, without long-term current use of insulin  [facility])   [place] Delles, Sharyle A, RPH-CPP   7 months ago Class 2 severe obesity due to excess calories with serious comorbidity and body mass index (BMI) of 36.0 to 36.9 in adult [facility])   [place] [facility] [place], [place] W, NP   10 months ago Type 2 diabetes mellitus without complication, without long-term current use of insulin  [facility])   [place] [facility] [place], Angeline ORN, NP       Future Appointments             In 4 months Baity, Angeline ORN, NP [place] [facility], [facility]

## 2023-02-16 NOTE — Telephone Encounter (Signed)
 Requested by interface surescripts. Medication discontinued 01/12/23.  Requested Prescriptions  Pending Prescriptions Disp Refills   rosuvastatin  (CRESTOR ) 20 MG tablet [Pharmacy Med Name: ROSUVASTATIN  20MG  TABLETS] 90 tablet 0    Sig: TAKE 1 TABLET(20 MG) BY MOUTH DAILY     Cardiovascular:  Antilipid - Statins 2 Failed - 02/16/2023 12:24 PM      Failed - Lipid Panel in normal range within the last 12 months    Cholesterol, Total  Date Value Ref Range Status  03/30/2020 128 100 - 199 mg/dL Final   Cholesterol  Date Value Ref Range Status  01/12/2023 142 <200 mg/dL Final   LDL Cholesterol (Calc)  Date Value Ref Range Status  01/12/2023 79 mg/dL (calc) Final    Comment:    Reference range: <100 . Desirable range <100 mg/dL for primary prevention;   <70 mg/dL for patients with CHD or diabetic patients  with > or = 2 CHD risk factors. SABRA LDL-C is now calculated using the Martin-Hopkins  calculation, which is a validated novel method providing  better accuracy than the Friedewald equation in the  estimation of LDL-C.  Gladis APPLETHWAITE et al. SANDREA. 7986;689(80): 2061-2068  (http://education.[facility].com/faq/FAQ164)    HDL  Date Value Ref Range Status  01/12/2023 47 > OR = 40 mg/dL Final  97/81/7977 41 >60 mg/dL Final   Triglycerides  Date Value Ref Range Status  01/12/2023 82 <150 mg/dL Final         Passed - Cr in normal range and within 360 days    Creat  Date Value Ref Range Status  01/12/2023 0.99 0.70 - 1.35 mg/dL Final   Creatinine, Urine  Date Value Ref Range Status  01/12/2023 184 20 - 320 mg/dL Final         Passed - Patient is not pregnant      Passed - Valid encounter within last 12 months    Recent Outpatient Visits           1 month ago Encounter for general adult medical examination with abnormal findings   [place] [facility] [place], Angeline ORN, NP   1 month ago Type 2 diabetes mellitus with other specified complication, without  long-term current use of insulin  [facility])   [place] [facility] Delles, Sharyle A, RPH-CPP   3 months ago Type 2 diabetes mellitus with other specified complication, without long-term current use of insulin  [facility])   [place] [facility] Delles, Sharyle A, RPH-CPP   7 months ago Class 2 severe obesity due to excess calories with serious comorbidity and body mass index (BMI) of 36.0 to 36.9 in adult [facility])   [place] [facility] [place], [place] W, NP   10 months ago Type 2 diabetes mellitus without complication, without long-term current use of insulin  [facility])   [place] [facility] [place], Angeline ORN, NP       Future Appointments             In 4 months Baity, Angeline ORN, NP [place] [facility]             metFORMIN  (GLUCOPHAGE ) 850 MG tablet [Pharmacy Med Name: METFORMIN  850MG  TABLETS] 180 tablet 2    Sig: TAKE 1 TABLET(850 MG) BY MOUTH TWICE DAILY WITH A MEAL     Endocrinology:  Diabetes - Biguanides Failed - 02/16/2023 12:24 PM      Failed - B12 Level in normal range  and within 720 days    No results found for: VITAMINB12       Failed - CBC within normal limits and completed in the last 12 months    WBC  Date Value Ref Range Status  01/12/2023 6.6 3.8 - 10.8 Thousand/uL Final   RBC  Date Value Ref Range Status  01/12/2023 5.47 4.20 - 5.80 Million/uL Final   Hemoglobin  Date Value Ref Range Status  01/12/2023 13.7 13.2 - 17.1 g/dL Final  97/81/7977 85.9 13.0 - 17.7 g/dL Final   HCT  Date Value Ref Range Status  01/12/2023 43.8 38.5 - 50.0 % Final   Hematocrit  Date Value Ref Range Status  03/30/2020 42.5 37.5 - 51.0 % Final   MCHC  Date Value Ref Range Status  01/12/2023 31.3 (L) 32.0 - 36.0 g/dL Final    Comment:    For adults, a slight decrease in the calculated MCHC value (in the range of 30 to 32 g/dL) is most likely not clinically significant; however, it should  be interpreted with caution in correlation with other red cell parameters and the patient's clinical condition.    [facility]  Date Value Ref Range Status  01/12/2023 25.0 (L) 27.0 - 33.0 pg Final   MCV  Date Value Ref Range Status  01/12/2023 80.1 80.0 - 100.0 fL Final  03/30/2020 79 79 - 97 fL Final  04/21/2012 80 80 - 100 fL Final   No results found for: PLTCOUNTKUC, LABPLAT, POCPLA RDW  Date Value Ref Range Status  01/12/2023 14.5 11.0 - 15.0 % Final  03/30/2020 14.6 11.6 - 15.4 % Final  04/21/2012 14.9 (H) 11.5 - 14.5 % Final         Passed - Cr in normal range and within 360 days    Creat  Date Value Ref Range Status  01/12/2023 0.99 0.70 - 1.35 mg/dL Final   Creatinine, Urine  Date Value Ref Range Status  01/12/2023 184 20 - 320 mg/dL Final         Passed - HBA1C is between 0 and 7.9 and within 180 days    Hemoglobin A1C  Date Value Ref Range Status  01/12/2023 7.4 (A) 4.0 - 5.6 % Final   HbA1c, POC (controlled diabetic range)  Date Value Ref Range Status  07/01/2022 6.9 0.0 - 7.0 % Final         Passed - eGFR in normal range and within 360 days    EGFR (African American)  Date Value Ref Range Status  04/21/2012 59 (L)  Final   GFR calc Af Amer  Date Value Ref Range Status  03/30/2020 100 >59 mL/min/1.73 Final    Comment:    **In accordance with recommendations from the NKF-ASN Task force,**   [facility] is in the process of updating its eGFR calculation to the   2021 CKD-EPI creatinine equation that estimates kidney function   without a race variable.    EGFR (Non-African Amer.)  Date Value Ref Range Status  04/21/2012 51 (L)  Final    Comment:    eGFR values <34mL/min/1.73 m2 may be an indication of chronic kidney disease (CKD). Calculated eGFR is useful in patients with stable renal function. The eGFR calculation will not be reliable in acutely ill patients when serum creatinine is changing rapidly. It is not useful in  patients on dialysis.  The eGFR calculation may not be applicable to patients at the low and high extremes of body sizes, pregnant women, and vegetarians.  GFR, Estimated  Date Value Ref Range Status  06/23/2021 >60 >60 mL/min Final    Comment:    (NOTE) Calculated using the CKD-EPI Creatinine Equation (2021)    eGFR  Date Value Ref Range Status  01/12/2023 83 > OR = 60 mL/min/1.14m2 Final         Passed - Valid encounter within last 6 months    Recent Outpatient Visits           1 month ago Encounter for general adult medical examination with abnormal findings   [place] [facility] [place], [place] W, NP   1 month ago Type 2 diabetes mellitus with other specified complication, without long-term current use of insulin  [facility])   [place] [facility] Delles, Sharyle A, RPH-CPP   3 months ago Type 2 diabetes mellitus with other specified complication, without long-term current use of insulin  [facility])   [place] [facility] Delles, Sharyle A, RPH-CPP   7 months ago Class 2 severe obesity due to excess calories with serious comorbidity and body mass index (BMI) of 36.0 to 36.9 in adult [facility])   [place] [facility] [place], [place] W, NP   10 months ago Type 2 diabetes mellitus without complication, without long-term current use of insulin  [facility])   [place] [facility] [place], Angeline ORN, NP       Future Appointments             In 4 months Baity, Angeline ORN, NP [place] [facility]            Refused Prescriptions Disp Refills   citalopram  (CELEXA ) 10 MG tablet [Pharmacy Med Name: CITALOPRAM  10MG  TABLETS] 90 tablet 1    Sig: TAKE 1 TABLET(10 MG) BY MOUTH DAILY     Psychiatry:  Antidepressants - SSRI Passed - 02/16/2023 12:24 PM      Passed - Completed PHQ-2 or PHQ-9 in the last 360 days      Passed - Valid encounter within last 6 months    Recent Outpatient Visits           1 month  ago Encounter for general adult medical examination with abnormal findings   [place] [facility] [place], [place] W, NP   1 month ago Type 2 diabetes mellitus with other specified complication, without long-term current use of insulin  [facility])   [place] [facility] [place] Delles, Sharyle A, RPH-CPP   3 months ago Type 2 diabetes mellitus with other specified complication, without long-term current use of insulin  [facility])   [place] [facility] Delles, Sharyle A, RPH-CPP   7 months ago Class 2 severe obesity due to excess calories with serious comorbidity and body mass index (BMI) of 36.0 to 36.9 in adult [facility])   [place] [facility] [place], [place] W, NP   10 months ago Type 2 diabetes mellitus without complication, without long-term current use of insulin  [facility])   [facility] [place], Angeline ORN, NP       Future Appointments             In 4 months Baity, Angeline ORN, NP  [facility], [facility]

## 2023-03-04 ENCOUNTER — Other Ambulatory Visit: Payer: Self-pay

## 2023-03-04 ENCOUNTER — Other Ambulatory Visit: Payer: Medicare HMO

## 2023-03-04 DIAGNOSIS — E1169 Type 2 diabetes mellitus with other specified complication: Secondary | ICD-10-CM

## 2023-03-09 ENCOUNTER — Other Ambulatory Visit: Payer: Medicare HMO | Admitting: Pharmacist

## 2023-03-09 DIAGNOSIS — E1169 Type 2 diabetes mellitus with other specified complication: Secondary | ICD-10-CM

## 2023-03-09 NOTE — Patient Instructions (Signed)
Goals Addressed             This Visit's Progress    Pharmacy Goals       Please watch the mail for an envelope from [facility] containing the patient assistance program application. Please complete this application and mail back to [facility] Pharmacy Technician Noreene Larsson [place] along with a copy of your Medicare Part D prescription card and a copy of your proof of income document OR you can bring these documents to the office to have them faxed back to Attention: Pattricia Boss at Fax # [phone]   If you need to call Noreene Larsson, you can reach her at [phone]  If you need to reach out to patient assistance programs regarding refills or to find out the status of your application, you can do so by calling:  [employer] at [phone]  Our goal A1c is less than 7%. This corresponds with fasting sugars less than 130 and 2 hour after meal sugars less than 180. Please keep a log of your results when checking your blood sugar   Our goal bad cholesterol, or LDL, is less than 70 . This is why it is important to continue taking your rosuvastatin.  Check your blood pressure twice weekly, and any time you have concerning symptoms like headache, chest pain, dizziness, shortness of breath, or vision changes.   Our goal is less than 130/80.  To appropriately check your blood pressure, make sure you do the following:  1) Avoid caffeine, exercise, or tobacco products for 30 minutes before checking. Empty your bladder. 2) Sit with your back supported in a flat-backed chair. Rest your arm on something flat (arm of the chair, table, etc). 3) Sit still with your feet flat on the floor, resting, for at least 5 minutes.  4) Check your blood pressure. Take 1-2 readings.  5) Write down these readings and bring with you to any provider appointments.  Bring your home blood pressure machine with you to a provider's office for accuracy comparison at least once a year.   Make sure you take your blood  pressure medications before you come to any office visit, even if you were asked to fast for labs.   Estelle Grumbles, PharmD, [facility] Clinical Pharmacist [facility] [phone]

## 2023-03-09 NOTE — Progress Notes (Signed)
03/09/2023 Name: Daniel Bradley. MRN: [id number] DOB: Nov 22, 1955  Chief Complaint  Patient presents with   Medication Assistance   Medication Management    Daniel Bradley. is a 68 y.o. year old male who presented for a telephone visit.   They were referred to the pharmacist by their PCP for assistance in managing diabetes and hypertension.      Subjective:   Care Team: Primary Care Provider: [name], NP ; Next Scheduled Visit: 07/13/2023 Cardiologist: Mirian Capuchin, MD Urologist: Vanna Scotland, MD Nurse Care Manager: Juanell Fairly, RN; Next Scheduled Visit: 04/08/2023  Medication Access/Adherence  Current Pharmacy:  [facility] - [place], [place] - [address] [place] [address] Phone: [phone] Fax: [phone]  [facility] [phone] [place], [place] - [address] AT [employer] OF [address] [place] [address] Phone: [phone] Fax: [phone]   Patient reports affordability concerns with their medications: No  Patient reports access/transportation concerns to their pharmacy: No  Patient reports adherence concerns with their medications:  No     Reports takes his medications directly from pill bottles and has a routine to aid with adherence; denies missed doses      Diabetes:   Current medications:  Glipizide 10 mg twice daily before breakfast and supper (30 minutes prior to meal) Metformin 850 mg twice daily Rybelsus 7 mg daily  Confirms taking on an empty stomach, >=30 minutes before the first food, beverage, or other oral medications of the day with <=4 oz of plain water only   Medications tried in the past: Rybelsus (cost); Ozempic (cost); Evaristo Bury (cost)    Reports recent morning blood sugar readings ranging 121-136  Denies symptoms of hypoglycemia     Statin therapy: rosuvastatin 20 mg daily  Current Physical Activity: restarted going to the  gym recently, walking on treadmill for 30 minutes and then weight machines for 30 minutes x ~3 days/week   Current medication access support: enrolled in patient assistance for Rybelsus from [employer] through 02/10/2023  - Reports has 2 month supply of Rybelsus remaining   Hypertension:   Current medications:  amlodipine 10 mg daily metoprolol 50 mg twice daily Losartan 25 mg daily     Patient has a validated, automated, upper arm home BP cuff Reports recent home BP readings:    Today: 130/80, HR 75 1/21: 135/80, HR 77 1/20: 121/80, HR 77    Patient denies hypotensive s/sx including dizziness, lightheadedness.    Current meal patterns: admits to occasionally adding salt to his food, but trying to limit   Objective:  Lab Results  Component Value Date   HGBA1C 7.4 (A) 01/12/2023    Lab Results  Component Value Date   CREATININE 0.99 01/12/2023   BUN 15 01/12/2023   NA 138 01/12/2023   K 4.9 01/12/2023   CL 104 01/12/2023   CO2 26 01/12/2023    Lab Results  Component Value Date   CHOL 142 01/12/2023   HDL 47 01/12/2023   LDLCALC 79 01/12/2023   TRIG 82 01/12/2023   CHOLHDL 3.0 01/12/2023   BP Readings from Last 3 Encounters:  01/12/23 128/74  07/01/22 134/62  03/27/22 118/70   Pulse Readings from Last 3 Encounters:  07/01/22 85  03/27/22 77  01/07/22 89     Medications Reviewed Today     Reviewed by [name], RPH-CPP (Pharmacist) on 03/09/23 at  1223  Med List Status: <None>   Medication Order Taking? Sig Documenting Provider Last Dose Status Informant  Accu-Chek Softclix Lancets lancets [id number]  Use to check Blood sugar once a day.  DX: E11.9 [name], NP  Active   Alcohol Swabs (DROPSAFE ALCOHOL PREP) 70 % PADS [id number]  USE AS DIRECTED TO CHECK BLOOD SUGAR ONCE A DAY [name], NP  Active   amLODipine (NORVASC) 10 MG tablet [id number] Yes TAKE 1 TABLET EVERY DAY [name], NP Taking Active   aspirin EC 81 MG  tablet [id number]  81 mg daily. Swallow whole. [provider]  Active   blood glucose meter kit and supplies [id number]  Dispense based on patient and insurance preference. Use to check blood sugar daily as directed. DX Ell.9 [name], NP  Active   Blood Glucose Monitoring Suppl (ACCU-CHEK GUIDE) w/Device KIT [id number]  Use to check blood sugar daily for type 2 diabetes E11.9 [name], NP  Active   Cholecalciferol 125 MCG (5000 UT) capsule [id number]  Take 5,000 Units by mouth daily. [provider]  Active   doxylamine, Sleep, (UNISOM) 25 MG tablet [id number]  Take 25 mg by mouth at bedtime as needed. [provider]  Active   glipiZIDE (GLUCOTROL) 10 MG tablet [id number] Yes TAKE 1 TABLET TWICE DAILY BEFORE MEALS Baity, [name], NP Taking Active   glucose blood (ACCU-CHEK GUIDE) test strip [id number]  Use to check blood sugar once a day.  DX: E11.9 [name], NP  Active   isosorbide mononitrate (IMDUR) 30 MG 24 hr tablet [id number]  Take 1 tablet (30 mg total) by mouth once daily [provider]  Active   losartan (COZAAR) 25 MG tablet [id number] Yes Take 1 tablet by mouth daily. [provider] Taking Active   metFORMIN (GLUCOPHAGE) 850 MG tablet [id number] Yes TAKE 1 TABLET(850 MG) BY MOUTH TWICE DAILY WITH A MEAL Baity, [name], NP Taking Active   metoprolol tartrate (LOPRESSOR) 50 MG tablet [id number] Yes Take 1 tablet by mouth 2 (two) times daily. [provider] Taking Active   rosuvastatin (CRESTOR) 20 MG tablet [id number] Yes TAKE 1 TABLET(20 MG) BY MOUTH DAILY [name], NP Taking Active   Semaglutide 7 MG TABS [id number] Yes Take 7 mg by mouth daily. [provider] Taking Active               Assessment/Plan:   Send referral to Care Guide team as patient requesting help to see if can get assistance with unexpected utilities cost  Diabetes: - Currently controlled - Reviewed long term cardiovascular  and renal outcomes of uncontrolled blood sugar - Reviewed goal A1c, goal fasting, and goal 2 hour post prandial glucose - Have encouraged dietary modifications including: Encouraged patient to have regular well-balance meals throughout the day (including breakfast), while controlling carbohydrate portion sizes Encouraged patient to review nutrition labels for carbohydrate content of foods - Have counseled on s/s of low blood sugar and how to treat lows Patient carries glucose tablets with him in case needed for hypoglycemia - Recommend to check glucose, keep a log of the results, have this record to review during future appointments, but to contact office sooner if needed for readings outside of established parameters or symptoms  - Collaborating with PCP and CPhT to aid patient with re-enrollment in patient assistance program for Rybelsus from [employer] for 2025   Will send message to CPhT to ask that she re-mail  application to new address.    Hypertension: - Currently controlled - Recommended to check home blood pressure and heart rate using upper arm monitor, keep a log of the results and have this record to review during future appointments        Follow Up Plan: Clinical Pharmacist will follow up with patient by telephone on 04/06/2023 at 11:30 AM     Estelle Grumbles, PharmD, [facility] Clinical Pharmacist [facility] [phone]

## 2023-03-10 ENCOUNTER — Telehealth: Payer: Self-pay | Admitting: *Deleted

## 2023-03-10 NOTE — Progress Notes (Signed)
Complex Care Management Note  Care Guide Note 03/10/2023 Name: Elzy Tomasello. MRN: [id number] DOB: 01/07/1956  [name]. is a 68 y.o. year old male who sees Baity, [name], NP for primary care. I reached out to [employer]. by phone today to offer complex care management services.  Mr. Levitz was given information about Complex Care Management services today including:   The Complex Care Management services include support from the care team which includes your Nurse Coordinator, Clinical Social Worker, or Pharmacist.  The Complex Care Management team is here to help remove barriers to the health concerns and goals most important to you. Complex Care Management services are voluntary, and the patient may decline or stop services at any time by request to their care team member.   Complex Care Management Consent Status: Patient agreed to services and verbal consent obtained.   Follow up plan:  Telephone appointment with complex care management team member scheduled for:  03/17/2023  Encounter Outcome:  Patient Scheduled  Burman Nieves, CMA, Care Guide [facility]  [facility], [facility] Guide Direct Dial: [phone]  Fax: [phone] Website: [email or web address]

## 2023-03-17 ENCOUNTER — Other Ambulatory Visit: Payer: Self-pay | Admitting: Internal Medicine

## 2023-03-17 ENCOUNTER — Ambulatory Visit: Payer: Self-pay

## 2023-03-17 NOTE — Patient Outreach (Signed)
  Care Coordination   Initial Visit Note   03/17/2023 Name: Daniel Bradley. MRN: [id number] DOB: 07/15/1955  Daniel Bradley. is a 68 y.o. year old male who sees Baity, Angeline ORN, NP for primary care. I spoke with  Daniel Bradley. by phone today.  What matters to the patients health and wellness today?  Patient needs resources for gas bill.    Goals Addressed             This Visit's Progress    Care Coordination Activities       Interventions Today    Flowsheet Row Most Recent Value  Chronic Disease   Chronic disease during today's visit Diabetes, Hypertension (HTN)  General Interventions   General Interventions Discussed/Reviewed General Interventions Discussed, General Interventions Reviewed, [employer], Communication with  [Pt needs help with gas bill that is over $300.Pt recently purchased home and had large repairs.SW referred to DSS-CIP to apply for help.CIP is not an ongoing resource, but one time assistance.]  Communication with --  [SW conference call [employer].  Pt has $50 OTC quarterly benenfit.Placed order for 2 items today.Staff will send book to order more supplies.]              SDOH assessments and interventions completed:  Yes  SDOH Interventions Today    Flowsheet Row Most Recent Value  SDOH Interventions   Food Insecurity Interventions Other (Comment)  [Insurance for extra help too free up income]  Housing Interventions Intervention Not Indicated  Transportation Interventions Intervention Not Indicated  [Has a car]  Utilities Interventions Other (Comment)  [Referred to CIP-DSS for gas bills]        Care Coordination Interventions:  Yes, provided   Follow up plan: No further intervention required.   Encounter Outcome:  Patient Visit Completed

## 2023-03-17 NOTE — Patient Instructions (Signed)
 Visit Information  Thank you for taking time to visit with me today. Please don't hesitate to contact me if I can be of assistance to you.   Following are the goals we discussed today:  Patient will contact DSS-CIP to apply for assistance with gas bill. Patient will order items from [facility] for personal needs.    If you are experiencing a Mental Health or Behavioral Health Crisis or need someone to talk to, please call 911  Patient verbalizes understanding of instructions and care plan provided today and agrees to view in MyChart. Active MyChart status and patient understanding of how to access instructions and care plan via MyChart confirmed with patient.     No further follow up required: Patient does not request a follow up visit.  Tillman Gardener, BSW [place]  [facility], [facility] Social Worker Direct Dial: [phone]  Fax: [phone] Website: [email or web address]

## 2023-03-18 ENCOUNTER — Telehealth: Payer: Self-pay

## 2023-03-18 NOTE — Telephone Encounter (Signed)
 Requested medication (s) are due for refill today - yes  Requested medication (s) are on the active medication list -yes  Future visit scheduled -yes  Last refill: 04/18/22 #100  Notes to clinic: no protocol listed-sent for review   Requested Prescriptions  Pending Prescriptions Disp Refills   ACCU-CHEK GUIDE TEST test strip [Pharmacy Med Name: Accu-Chek Guide Test In Vitro Strip] 100 strip 3    Sig: TEST BLOOD SUGAR ONE TIME DAILY     There is no refill protocol information for this order       Requested Prescriptions  Pending Prescriptions Disp Refills   ACCU-CHEK GUIDE TEST test strip [Pharmacy Med Name: Accu-Chek Guide Test In Vitro Strip] 100 strip 3    Sig: TEST BLOOD SUGAR ONE TIME DAILY     There is no refill protocol information for this order

## 2023-03-18 NOTE — Telephone Encounter (Signed)
 Copied from CRM [id number]. Topic: General - Other >> Mar 18, 2023  3:45 PM Tobias L wrote: Reason for CRM: Pt requesting letter for social security for financial help. Pt requesting needs letter to have following information: his knee surgery (left leg), heart attack he had in 2019 and that he now has two stents in heart, cancer diagnosis in 2015 and his diabetes, high blood pressure and his A1C levels.   Please assist pt further

## 2023-03-19 NOTE — Telephone Encounter (Signed)
 This is not something that I have done before.  Is this something that Optometrist is asking him to do?  Is he sure that there are not specific forms that needed to be filled out.  Is he applying for disability?

## 2023-03-19 NOTE — Telephone Encounter (Signed)
 Spoke to patient. He will contact social services/security to see if there are forms that we need to complete.

## 2023-03-24 NOTE — Patient Outreach (Signed)
Care Management   Visit Note   Name: Daniel Bradley. MRN: [id number] DOB: 1955/07/27  Subjective: Daniel Bradley. is a 68 y.o. year old male who is a primary care patient of [name], NP. The Care Management team was consulted for assistance.      Engaged with Mr. Longton via telephone.  Assessment:  Review of patient past medical history, allergies, medications, health status, including review of consultants reports, laboratory and other test data, was performed as part of  evaluation and provision of care management services.   Outpatient Encounter Medications as of 03/04/2023  Medication Sig   Accu-Chek Softclix Lancets lancets Use to check Blood sugar once a day.  DX: E11.9   Alcohol Swabs (DROPSAFE ALCOHOL PREP) 70 % PADS USE AS DIRECTED TO CHECK BLOOD SUGAR ONCE A DAY   amLODipine (NORVASC) 10 MG tablet TAKE 1 TABLET EVERY DAY   aspirin EC 81 MG tablet 81 mg daily. Swallow whole.   blood glucose meter kit and supplies Dispense based on patient and insurance preference. Use to check blood sugar daily as directed. DX Ell.9   Blood Glucose Monitoring Suppl (ACCU-CHEK GUIDE) w/Device KIT Use to check blood sugar daily for type 2 diabetes E11.9   Cholecalciferol 125 MCG (5000 UT) capsule Take 5,000 Units by mouth daily.   doxylamine, Sleep, (UNISOM) 25 MG tablet Take 25 mg by mouth at bedtime as needed.   glipiZIDE (GLUCOTROL) 10 MG tablet TAKE 1 TABLET TWICE DAILY BEFORE MEALS   isosorbide mononitrate (IMDUR) 30 MG 24 hr tablet Take 1 tablet (30 mg total) by mouth once daily   losartan (COZAAR) 25 MG tablet Take 1 tablet by mouth daily.   metFORMIN (GLUCOPHAGE) 850 MG tablet TAKE 1 TABLET(850 MG) BY MOUTH TWICE DAILY WITH A MEAL   metoprolol tartrate (LOPRESSOR) 50 MG tablet Take 1 tablet by mouth 2 (two) times daily.   rosuvastatin (CRESTOR) 20 MG tablet TAKE 1 TABLET(20 MG) BY MOUTH DAILY   Semaglutide 7 MG TABS Take 7 mg by mouth daily.   [DISCONTINUED] glucose blood  (ACCU-CHEK GUIDE) test strip Use to check blood sugar once a day.  DX: E11.9   No facility-administered encounter medications on file as of 03/04/2023.    Interventions:   Goals Addressed             This Visit's Progress    Monitor, Self-Manage and Reduce Symptoms of Diabetes        Current Barriers:  Care Management support and education needs related to effective management of DM Financial Constraints.   Planned Interventions: Reviewed treatment plan for diabetes management. Reviewed medications with patient and discussed importance of medication adherence. Reports taking medications as prescribed. Denies concerns regarding medication management. He is working with Ilda Basset D to assist with prescription costs. Reports obtaining semaglutide and taking as prescribed. Discussed importance of blood glucose readings. Reports currently not monitoring due to moving to a new home and needing to unpack. Reports he plans to start rechecking his blood sugars within the next week. Agreed to update the provider if a new glucometer is needed. Advised to maintain a log when he monitors his blood glucose levels. Provided information regarding hypoglycemia and hyperglycemia along with appropriate interventions. Advised to monitor symptoms closely since he is not currently monitoring his blood sugars. Discussed nutritional intake and importance of complying with a diabetic diet. Reports attempting to adhere to recommended diet. Trying to consume lean proteins and using oven or air fryer versus deep  frying.  Advised to increase consumption of fruits and vegetables. Advised to monitor intake of carbohydrates and avoid foods and beverages with added sugar when possible. Reports previously drinking lots of [facility] which he has discontinued. Reports consuming cranberry juice and orange juice with breakfast. Reports using low sugar beverages. He does express concerns r/t financial constraints and could use assistance  with nutrition resources. Referral will be placed for outreach with BSW or [facility] Guide for assistance.  Discussed importance of completing recommended DM preventive care. Reports completing daily foot care as advised. Reports eye is exam is up to date. Reports being followed by Dr. Clydene Pugh at [facility]. Discussed importance of completing ordered labs as prescribed.  Assessed social determinant of health barriers. Referral will be placed for outreach with BSW or [facility] Guide for assistance.    Lab Results  Component Value Date   HGBA1C 7.4 (A) 01/12/2023    Symptom Management: Attend all scheduled provider appointments Take all medications as prescribed Monitor blood glucose and maintain a log Check feet daily for cuts, sores or redness Wash and dry feet carefully every day Wear comfortable, cotton socks Wear comfortable, well-fitting shoes Read food labels for fat, fiber, carbohydrates and portion size Call provider office for new concerns or questions      RNCM Care Management Expected Outcome:  Monitor, Self-Manage and Reduce Symptoms of: HLD       Current Barriers:  Chronic Disease Management support and education needs related to effective management of HLD  Planned Interventions: Reviewed current treatment plan Reviewed provider established cholesterol goals. Discussed importance of regular laboratory monitoring. Reviewed role and benefits of statin for ASCVD risk reduction. Discussed strategies to manage statin-induced myalgias. Reports tolerating Crestor. Advised to take medications as prescribed and notify provider with concerns r/t side effects. Reviewed importance of limiting foods high in cholesterol. Reviewed exercise goals and target of 150 minutes per week. Screening for signs and symptoms of depression related to chronic disease state. Assessed social determinant of health barriers.   BP Readings from Last 3 Encounters:   01/12/23 128/74  07/01/22 134/62  03/27/22 118/70     Lab Results  Component Value Date   CHOL 142 01/12/2023   HDL 47 01/12/2023   LDLCALC 79 01/12/2023   TRIG 82 01/12/2023   CHOLHDL 3.0 01/12/2023      Symptom Management: Take medications as prescribed   Attend all scheduled provider appointments Call pharmacy for medication refills 3-7 days in advance of running out of medications Call doctor with any symptoms you believe are related to your medicine Call provider office for new concerns or questions         PLAN Will follow up next month   Juanell Fairly [facility] RN Care Manager Direct Dial: [phone]  Fax: [phone] Website: [email or web address]

## 2023-03-27 ENCOUNTER — Telehealth: Payer: Self-pay

## 2023-03-27 NOTE — Progress Notes (Signed)
Complex Care Management Note Care Guide Note  03/27/2023 Name: Daniel Bradley. MRN: [id number] DOB: [birth year]   Complex Care Management Outreach Attempts: An unsuccessful telephone outreach was attempted today to offer the patient information about available complex care management services.  Follow Up Plan:  Additional outreach attempts will be made to offer the patient complex care management information and services.   Encounter Outcome:  No Answer  Gahel Safley [facility]  [facility] Guide Direct Dial: [phone]  Fax: [phone] Website: [email or web address]

## 2023-03-30 ENCOUNTER — Telehealth: Payer: Self-pay

## 2023-03-30 NOTE — Progress Notes (Signed)
Complex Care Management Note Care Guide Note  03/30/2023 Name: Daniel Bradley. MRN: [id number] DOB: 1955-04-14  [name]. is a 68 y.o. year old male who is a primary care patient of [name], NP . The community resource team was consulted for assistance with Food Insecurity and Financial Difficulties related to utilities.  SDOH screenings and interventions completed:  Yes  Social Drivers of Health From This Encounter   Food Insecurity: Food Insecurity Present (03/30/2023)   Hunger Vital Sign    Worried About Running Out of Food in the Last Year: Sometimes true    Ran Out of Food in the Last Year: Sometimes true  Housing: Low Risk  (03/30/2023)   Housing Stability Vital Sign    Unable to Pay for Housing in the Last Year: No    Number of Times Moved in the Last Year: 0    Homeless in the Last Year: No  Financial Resource Strain: High Risk (03/30/2023)   Overall Financial Resource Strain (CARDIA)    Difficulty of Paying Living Expenses: Hard  Transportation Needs: No Transportation Needs (03/30/2023)   PRAPARE - Administrator, Civil Service (Medical): No    Lack of Transportation (Non-Medical): No  Utilities: Not At Risk (03/30/2023)   Utilities    Threatened with loss of utilities: No    SDOH Interventions Today    Flowsheet Row Most Recent Value  SDOH Interventions   Food Insecurity Interventions [id number] Referral, Other (Comment)  [Patient consented to [facility] referral to  [facility] Emergency Food Assistance Program.]  Utilities Interventions Other (Comment)  [Spoke to patient about Medical laboratory scientific officer program.]  Financial Strain Interventions Other (Comment)  [Spoke to patient about Medical laboratory scientific officer program.]        Care guide performed the following interventions: Patient is having difficulty paying living expenses and has little money left over for food. Any assistance would be appreciated.  Follow  Up Plan:  No further follow up planned at this time. The patient has been provided with needed resources.  Encounter Outcome:  Patient Visit Completed  Pinkney Venard [facility]  [facility] Guide Direct Dial: [phone]  Fax: [phone] Website: [email or web address]

## 2023-04-01 ENCOUNTER — Telehealth: Payer: Self-pay | Admitting: Pharmacy Technician

## 2023-04-01 DIAGNOSIS — Z5986 Financial insecurity: Secondary | ICD-10-CM

## 2023-04-01 NOTE — Progress Notes (Signed)
Pharmacy Medication Assistance Program Note    04/01/2023  Patient ID: Tanor Glaspy., male   DOB: 01/23/1956, 68 y.o.   MRN: [id number]     01/15/2023 04/01/2023  Outreach Medication One  Initial Outreach Date (Medication One) 01/14/2023   Manufacturer Medication One [employer] [facility] Rybelsus   Dose of Rybelsus 7mg    Type of Radiographer, therapeutic Assistance   Date Application Sent to Patient 01/14/2023   Application Items Requested Application;Proof of Income;Other   Date Application Sent to Prescriber 01/21/2023   Name of Prescriber Nicki Reaper   Date Application Received From Patient  03/26/2023  Application Items Received From Patient  Application;Proof of Income;Other  Date Application Received From Provider  01/26/2023  Date Application Submitted to Manufacturer  04/01/2023  Method Application Sent to [employer] application.  Pattricia Boss, CPhT [place]  Office: [phone] Fax: [phone] Email: [email or web address]

## 2023-04-06 ENCOUNTER — Other Ambulatory Visit: Payer: Medicare HMO | Admitting: Pharmacist

## 2023-04-06 NOTE — Progress Notes (Signed)
 04/06/2023 Name: Daniel Bradley. MRN: [id number] DOB: 1955-07-09  Chief Complaint  Patient presents with   Medication Management   Medication Assistance    Daniel Bradley. is a 68 y.o. year old male who presented for a telephone visit.   They were referred to the pharmacist by their PCP for assistance in managing diabetes and hypertension.      Subjective:   Care Team: Primary Care Provider: [name], NP ; Next Scheduled Visit: 07/13/2023 Cardiologist: Mirian Capuchin, MD Urologist: Vanna Scotland, MD Nurse Care Manager: Juanell Fairly, RN; Next Scheduled Visit: 04/08/2023  Medication Access/Adherence  Current Pharmacy:  [facility] - [place], [place] - [address] [place] [address] Phone: [phone] Fax: [phone]  [facility] [phone] [place], [place] - [address] AT [facility] OF [address] [place] [address] Phone: [phone] Fax: [phone]   Patient reports affordability concerns with their medications: No  Patient reports access/transportation concerns to their pharmacy: No  Patient reports adherence concerns with their medications:  No     Reports takes his medications directly from pill bottles and has a routine to aid with adherence; denies missed doses      Diabetes:   Current medications:  Glipizide 10 mg twice daily before breakfast and supper (30 minutes prior to meal) Metformin 850 mg twice daily Rybelsus 7 mg daily  Confirms taking on an empty stomach, >=30 minutes before the first food, beverage, or other oral medications of the day with <=4 oz of plain water only   Medications tried in the past: Rybelsus (cost); Ozempic (cost); Evaristo Bury (cost)    Reports recent morning blood sugar readings ranging 120-146 - Attributes variation to recent eating habits (eating more sweets)   Denies symptoms of hypoglycemia     Statin therapy:  rosuvastatin 20 mg daily   Current Physical Activity: going to the gym recently, walking on treadmill for 30 minutes and then weight machines for 30 minutes x ~2 days/week   Current medication access support: enrolled in patient assistance for Rybelsus from [employer] through 02/10/2024 - Reports has >2 month supply of Rybelsus remaining   Hypertension:   Current medications:  amlodipine 10 mg daily metoprolol 50 mg twice daily Losartan 25 mg daily Isosorbide ER 30 mg daily    Patient has a validated, automated, upper arm home BP cuff Reports recent home BP readings:    2/9: 135/73, HR 77 2/10: 141/77, HR 74 2/11: 137/82, HR 80 2/12: 135/77, HR 74 2/14: 144/80, HR 82 2/15: 131/76, HR 83 2/19: 139/77, HR 76  Attributes elevated readings to eating saltier food (fast)   Patient denies hypotensive s/sx including dizziness, lightheadedness.    Current Physical Activity: going to the gym recently, walking on treadmill for 30 minutes and then weight machines for 30 minutes x ~2 days/week     Objective:  Lab Results  Component Value Date   HGBA1C 7.4 (A) 01/12/2023    Lab Results  Component Value Date   CREATININE 0.99 01/12/2023   BUN 15 01/12/2023   NA 138 01/12/2023   K 4.9 01/12/2023   CL 104 01/12/2023   CO2 26 01/12/2023    Lab Results  Component Value Date   CHOL 142 01/12/2023   HDL 47 01/12/2023   LDLCALC 79 01/12/2023   TRIG 82 01/12/2023   CHOLHDL 3.0 01/12/2023   BP Readings from Last  3 Encounters:  01/12/23 128/74  07/01/22 134/62  03/27/22 118/70   Pulse Readings from Last 3 Encounters:  07/01/22 85  03/27/22 77  01/07/22 89    Medications Reviewed Today     Reviewed by [name], RPH-CPP (Pharmacist) on 04/06/23 at 1151  Med List Status: <None>   Medication Order Taking? Sig Documenting Provider Last Dose Status Informant  ACCU-CHEK GUIDE TEST test strip [id number]  TEST BLOOD SUGAR ONE TIME DAILY [name], NP  Active    Accu-Chek Softclix Lancets lancets [id number]  Use to check Blood sugar once a day.  DX: E11.9 [name], NP  Active   Alcohol Swabs (DROPSAFE ALCOHOL PREP) 70 % PADS [id number]  USE AS DIRECTED TO CHECK BLOOD SUGAR ONCE A DAY [name], NP  Active   amLODipine (NORVASC) 10 MG tablet [id number] Yes TAKE 1 TABLET EVERY DAY [name], NP Taking Active   aspirin EC 81 MG tablet [id number]  81 mg daily. Swallow whole. [provider]  Active   blood glucose meter kit and supplies [id number]  Dispense based on patient and insurance preference. Use to check blood sugar daily as directed. DX Ell.9 [name], NP  Active   Blood Glucose Monitoring Suppl (ACCU-CHEK GUIDE) w/Device KIT [id number]  Use to check blood sugar daily for type 2 diabetes E11.9 [name], NP  Active   Cholecalciferol 125 MCG (5000 UT) capsule [id number]  Take 5,000 Units by mouth daily. [provider]  Active   doxylamine, Sleep, (UNISOM) 25 MG tablet [id number]  Take 25 mg by mouth at bedtime as needed. [provider]  Active   glipiZIDE (GLUCOTROL) 10 MG tablet [id number] Yes TAKE 1 TABLET TWICE DAILY BEFORE MEALS Baity, [name], NP Taking Active   isosorbide mononitrate (IMDUR) 30 MG 24 hr tablet [id number] Yes Take 1 tablet (30 mg total) by mouth once daily [provider] Taking Active   losartan (COZAAR) 25 MG tablet [id number] Yes Take 1 tablet by mouth daily. [provider] Taking Active   metFORMIN (GLUCOPHAGE) 850 MG tablet [id number] Yes TAKE 1 TABLET(850 MG) BY MOUTH TWICE DAILY WITH A MEAL Baity, [name], NP Taking Active   metoprolol tartrate (LOPRESSOR) 50 MG tablet [id number] Yes Take 1 tablet by mouth 2 (two) times daily. [provider] Taking Active   rosuvastatin (CRESTOR) 20 MG tablet [id number]  TAKE 1 TABLET(20 MG) BY MOUTH DAILY [name], NP  Active   Semaglutide 7 MG TABS [id number] Yes Take 7 mg by mouth daily. [provider] Taking Active               Assessment/Plan:   Recommend patient restart using weekly pillbox as adherence aid  Diabetes: - Currently controlled - Reviewed long term cardiovascular and renal outcomes of uncontrolled blood sugar - Reviewed goal A1c, goal fasting, and goal 2 hour post prandial glucose - Encouraged dietary modifications including: Encouraged patient to have regular well-balance meals throughout the day (including breakfast), while controlling carbohydrate portion sizes Encouraged patient to review nutrition labels for carbohydrate content of foods - Have counseled on s/s of low blood sugar and how to treat lows Patient carries glucose tablets with him in case needed for hypoglycemia - Recommend to check glucose, keep a log of the results, have this record to review during future appointments, but to contact office sooner if needed for readings outside of established parameters or symptoms  - Collaborating with PCP  and CPhT to aid patient with re-enrollment in patient assistance program for Rybelsus from [employer] for 2025              CPhT faxed application to [employer] on 04/01/2023   Hypertension: - Currently controlled - Counsel on impact of salt/sodium on blood pressure. Encourage patient to limit intake of salt and fast food - Recommended to check home blood pressure and heart rate using upper arm monitor, keep a log of the results and have this record to review during future appointments        Follow Up Plan: Clinical Pharmacist will follow up with patient by telephone on 05/18/2023 at 11:30 AM     Estelle Grumbles, PharmD, [facility] Clinical Pharmacist [facility] [phone]

## 2023-04-06 NOTE — Patient Instructions (Signed)
 Goals Addressed             This Visit's Progress    Pharmacy Goals       If you need to reach out to patient assistance programs regarding refills or to find out the status of your application, you can do so by calling:  [employer] at [phone]  Our goal A1c is less than 7%. This corresponds with fasting sugars less than 130 and 2 hour after meal sugars less than 180. Please keep a log of your results when checking your blood sugar   Our goal bad cholesterol, or LDL, is less than 70 . This is why it is important to continue taking your rosuvastatin.  Check your blood pressure twice weekly, and any time you have concerning symptoms like headache, chest pain, dizziness, shortness of breath, or vision changes.   Our goal is less than 130/80.  To appropriately check your blood pressure, make sure you do the following:  1) Avoid caffeine, exercise, or tobacco products for 30 minutes before checking. Empty your bladder. 2) Sit with your back supported in a flat-backed chair. Rest your arm on something flat (arm of the chair, table, etc). 3) Sit still with your feet flat on the floor, resting, for at least 5 minutes.  4) Check your blood pressure. Take 1-2 readings.  5) Write down these readings and bring with you to any provider appointments.  Bring your home blood pressure machine with you to a provider's office for accuracy comparison at least once a year.   Make sure you take your blood pressure medications before you come to any office visit, even if you were asked to fast for labs.  Estelle Grumbles, PharmD, [facility] Clinical Pharmacist [facility] [phone]

## 2023-04-08 ENCOUNTER — Other Ambulatory Visit: Payer: Self-pay

## 2023-04-08 ENCOUNTER — Telehealth: Payer: Self-pay

## 2023-04-08 NOTE — Patient Outreach (Signed)
  Care Management   Outreach Note  04/08/2023 Name: Humberto Addo. MRN: [id number] DOB: 17-Oct-1955  An unsuccessful outreach attempt was made today for a scheduled Care Management visit.   Follow Up Plan:  A HIPAA compliant phone message was left for the patient providing contact information and requesting a return call.     Juanell Fairly [facility] Population Health RN Care Manager Direct Dial: [phone]  Fax: [phone] Website: [email or web address]

## 2023-04-14 ENCOUNTER — Telehealth: Payer: Self-pay | Admitting: Pharmacy Technician

## 2023-04-14 DIAGNOSIS — Z5986 Financial insecurity: Secondary | ICD-10-CM

## 2023-04-14 NOTE — Progress Notes (Signed)
 Pharmacy Medication Assistance Program Note    04/14/2023  Patient ID: Daniel Bradley., male   DOB: 02-08-1956, 67 y.o.   MRN: [id number]     01/15/2023 04/01/2023 04/14/2023  Outreach Medication One  Initial Outreach Date (Medication One) 01/14/2023    Manufacturer Medication One [employer] [facility] Rybelsus    Dose of Rybelsus 7mg     Type of Radiographer, therapeutic Assistance    Date Application Sent to Patient 01/14/2023    Application Items Requested Application;Proof of Income;Other    Date Application Sent to Prescriber 01/21/2023    Name of Prescriber Nicki Reaper    Date Application Received From Patient  03/26/2023   Application Items Received From Patient  Application;Proof of Income;Other   Date Application Received From Provider  01/26/2023   Date Application Submitted to Manufacturer  04/01/2023   Method Application Sent to Manufacturer  Fax   Patient Assistance Determination   Approved  Approval Start Date   04/02/2023  Approval End Date   02/10/2024  Additional Outreach Contact   Provider  Contacted Provider   Message    Care coordination call placed to [employer] in regard to Rybelsus application.   Per [employer] IVR system, patient is APPROVED 04/02/23-02/10/24. Initial medication orders and subsequent refill orders will process automatically and be delivered to the prescriber's office. Patient may call [employer] at any time to check on shipments by calling [phone].    Will send in basket message to [facility] Pharmacists Estelle Grumbles requesting she notify patient of his approval at their next scheduled office visit  as well as case closure as patient assistance is completed.  Pattricia Boss, CPhT [place]  Office: [phone] Fax: [phone] Email: [email or web address]

## 2023-05-15 ENCOUNTER — Other Ambulatory Visit: Payer: Self-pay | Admitting: Internal Medicine

## 2023-05-15 NOTE — Telephone Encounter (Signed)
 Last OV 01/12/23 Requested Prescriptions  Pending Prescriptions Disp Refills   glipiZIDE (GLUCOTROL) 10 MG tablet [Pharmacy Med Name: glipiZIDE Oral Tablet 10 MG] 180 tablet 3    Sig: TAKE 1 TABLET TWICE DAILY BEFORE MEALS     Endocrinology:  Diabetes - Sulfonylureas Failed - 05/15/2023  4:20 PM      Failed - Valid encounter within last 6 months    Recent Outpatient Visits   None     Future Appointments             In 1 month Baity, [name], NP [place] [facility]            Passed - HBA1C is between 0 and 7.9 and within 180 days    Hemoglobin A1C  Date Value Ref Range Status  01/12/2023 7.4 (A) 4.0 - 5.6 % Final   HbA1c, POC (controlled diabetic range)  Date Value Ref Range Status  07/01/2022 6.9 0.0 - 7.0 % Final         Passed - Cr in normal range and within 360 days    Creat  Date Value Ref Range Status  01/12/2023 0.99 0.70 - 1.35 mg/dL Final   Creatinine, Urine  Date Value Ref Range Status  01/12/2023 184 20 - 320 mg/dL Final

## 2023-05-18 ENCOUNTER — Other Ambulatory Visit: Payer: Medicare HMO | Admitting: Pharmacist

## 2023-05-18 ENCOUNTER — Other Ambulatory Visit: Payer: Self-pay

## 2023-05-18 DIAGNOSIS — E1169 Type 2 diabetes mellitus with other specified complication: Secondary | ICD-10-CM

## 2023-05-18 DIAGNOSIS — Z7984 Long term (current) use of oral hypoglycemic drugs: Secondary | ICD-10-CM

## 2023-05-18 DIAGNOSIS — I10 Essential (primary) hypertension: Secondary | ICD-10-CM

## 2023-05-18 NOTE — Patient Instructions (Signed)
 Thank you for allowing the Care Management team to participate in your care. It was great speaking with you today!   Our next appointment is by telephone on Jul 01, 2023 at 2 pm/ Please do not hesitate to contact me if you require assistance prior to our next outreach.  Following is a copy of your care plan:   Goals Addressed      VBCI RN Care Plan: Monitor, Self-Manage And Reduce Symptoms of Diabetes       Problems:  Chronic Disease Management support and education needs related to Diabetes  Goal: Over the next 3 months the patient will attend all scheduled medical appointments.    Continue to work with acreto address care management and care coordination needs as evidenced by adherence to CM Team scheduled appointments     Demonstrate ongoing self health care management ability.  Not experience hospital admission as evidenced by review of EMR.  Take all medications exactly as prescribed and call provider or assigned pharmacist for medication related questions  Interventions:  Diabetes Interventions:  (Status:  Goal on track:  Yes.) Long Term Goal Assessed patient's understanding of A1c goal: <7% Reviewed treatment plan for diabetes management. Reviewed medications with patient and discussed importance of medication adherence. Reports taking medications as prescribed. Denies concerns regarding medication management. He is working with embedded pharmacist. Agreed to update the team if he requires assistance with medication management or prescription cost. Discussed importance of blood glucose readings. Reports currently not monitoring due to moving to a new home. Reports home require extensive repairs. Plans to start rechecking blood sugars within the next month. Advised to maintain a log when he monitors his blood glucose levels. Provided information regarding hypoglycemia and hyperglycemia along with appropriate interventions. Advised to monitor symptoms closely since he is not currently  monitoring his blood sugars. Discussed nutritional intake and importance of complying with a diabetic diet. Reports attempting to adhere to recommended diet. Trying to consume lean proteins and using oven or air fryer versus deep frying.  Advised to increase consumption of fruits and vegetables. Advised to monitor intake of carbohydrates and avoid foods and beverages with added sugar when possible.  Discussed importance of completing recommended DM preventive care. Reports completing daily foot care as advised. Reports eye is exam is up to date. Reports being followed by Dr. Clydene Pugh at [facility]. Reports completing foot care as advised. Advised to ensure dental exam is up to date. Discussed importance of completing ordered labs as prescribed.  Assessed social determinant of health barriers.  Lab Results  Component Value Date   HGBA1C 7.4 (A) 01/12/2023    Patient Self-Care Activities:  Reviewed treatment plan for diabetes management. Reviewed medications with patient and discussed importance of medication adherence. Reports taking medications as prescribed. Denies concerns regarding medication management. He is working with embedded pharmacist. Agreed to update the team if he requires assistance with medication management or prescription cost. Discussed importance of blood glucose readings. Reports currently not monitoring due to moving to a new home. Reports home require extensive repairs. Plans to start rechecking blood sugars within the next month. Advised to maintain a log when he monitors his blood glucose levels. Provided information regarding hypoglycemia and hyperglycemia along with appropriate interventions. Advised to monitor symptoms closely since he is not currently monitoring his blood sugars. Discussed nutritional intake and importance of complying with a diabetic diet. Reports attempting to adhere to recommended diet. Trying to consume lean proteins and using oven or air fryer  versus deep frying.  Advised to increase consumption of fruits and vegetables. Advised to monitor intake of carbohydrates and avoid foods and beverages with added sugar when possible.  Discussed importance of completing recommended DM preventive care. Reports completing daily foot care as advised. Reports eye is exam is up to date. Reports being followed by Dr. Clydene Pugh at [facility]. Reports completing foot care as advised. Advised to ensure dental exam is up to date. Discussed importance of completing ordered labs as prescribed.  Assessed social determinant of health barriers.     Plan:  Telephone follow up appointment with care management team member scheduled for Jul 01, 2023 at 2 pm.          VBCI RN Care Plan: Monitor, Self-Manage And Reduce Symptoms of Hypertension       Problems:  Chronic Disease Management support and education needs related to HTN  Goal: Over the next 3 months the patient will attend all scheduled medical appointments.  Demonstrate ongoing self health care management Not experience hospital admission as evidenced by review of EMR.  Take all medications exactly as prescribed and call provider or assigned pharmacist for medication related questions  Monitor blood pressure and maintain a log  Interventions:  Hypertension Interventions:  (Status:  Goal on track:  Yes.) Long Term Goal Last practice recorded BP readings:  BP Readings from Last 3 Encounters:  01/12/23 128/74  07/01/22 134/62  03/27/22 118/70   Most recent eGFR/CrCl:  Lab Results  Component Value Date   EGFR 83 01/12/2023    No components found for: "CRCL"  Reviewed current treatment plan related to Hypertension, self-management, and adherence to plan as established by provider.  Reviewed medications and indications for use. Reports taking medications as prescribed. Denies current concerns related to medication management or prescription cost. Provided information regarding established  blood pressure parameters along with indications for notifying a provider. Advised to monitor BP consistently and record readings.  Reviewed symptoms. Denies chest pain or palpitations. Denies headaches, dizziness, or visual changes.  Reviewed activity tolerance. Reports usually exercising consistently. Ability to exercise has been limited due to time constraints. Reports being very busy with extensive home repairs. Plans to resume exercise regimen within the next month. Discussed compliance with recommended cardiac prudent diet. Encouraged to read nutrition labels, continue monitoring sodium intake, and avoid highly processed foods when possible.  Reviewed signs and symptoms of heart attack, stroke and worsening symptoms that require immediate medical attention.  Patient Self-Care Activities:  Take all medications as prescribed Attend all scheduled provider appointments Call pharmacy for medication refills 3-7 days in advance of running out of medications Check blood pressure and keep a log Call doctor for signs and symptoms of high blood pressure Resume activity/ exercises  Read nutrition labels. Eat whole grains, fruits and vegetables, lean meats and healthy fats Limit salt intake  Call provider office for new concerns or questions   Plan Telephone follow up appointment with care management team member scheduled for Jul 01, 2023 at 2 pm.           VBCI RN Care Plan:Monitor, Self-Manage And Reduce Symptoms of Hyperlipidemia       Problems:  Chronic Disease Management support and education needs related to HLD  Goal: Over the next 3 months the patient will attend all scheduled medical appointments Continue to work with care team to address care management and care coordination needs as evidenced by adherence to scheduled appointments     Demonstrate ongoing self  health care management   Take all medications exactly as prescribed and call provider or pharmacist for medication related  questions   Interventions:  Hyperlipidemia Interventions:  (Status:  Goal on track:  Yes.) Long Term Goal Medication review performed; medication list updated in electronic medical record.  Reviewed current treatment plan Reviewed provider established cholesterol goals. Discussed importance of regular laboratory monitoring. Reviewed role and benefits of statin for ASCVD risk reduction. Discussed strategies to manage statin-induced myalgias. Reports tolerating Crestor. Advised to take medications as prescribed and notify provider with concerns r/t side effects. Reviewed importance of limiting foods high in cholesterol. Reviewed exercise goals and target of 150 minutes per week. Screening for signs and symptoms of depression related to chronic disease state. Assessed social determinant of health barriers.   Lab Results  Component Value Date   CHOL 142 01/12/2023   HDL 47 01/12/2023   LDLCALC 79 01/12/2023   TRIG 82 01/12/2023   CHOLHDL 3.0 01/12/2023      Patient Self-Care Activities:  Take medications as prescribed   Attend all scheduled provider appointments Call pharmacy for medication refills 3-7 days in advance of running out of medications Call doctor with any symptoms you believe are related to your medicine Call provider office for new concerns or questions     Plan:  Telephone follow up appointment with care management team member scheduled for Jul 01, 2023 at 2 pm.          If you are experiencing a Mental Health or Behavioral Health Crisis or need someone to talk to. -Please call the Suicide and Crisis Lifeline: 988 -Call 1-800-273-TALK (toll free, 24 hour hotline) -Call 911    Daniel Bradley verbalized understanding of instructions and care plan provided today. Agreed to view in MyChart.       Juanell Fairly [facility] Population Health RN Care Manager Direct Dial: [phone]  Fax: [phone] Website: [email or web address]

## 2023-05-18 NOTE — Patient Instructions (Signed)
 Goals Addressed             This Visit's Progress    Pharmacy Goals       If you need to reach out to patient assistance programs regarding refills or to find out the status of your application, you can do so by calling:  [employer] at [phone]  Our goal A1c is less than 7%. This corresponds with fasting sugars less than 130 and 2 hour after meal sugars less than 180. Please keep a log of your results when checking your blood sugar   Our goal bad cholesterol, or LDL, is less than 70 . This is why it is important to continue taking your rosuvastatin.  Check your blood pressure twice weekly, and any time you have concerning symptoms like headache, chest pain, dizziness, shortness of breath, or vision changes.   Our goal is less than 130/80.  To appropriately check your blood pressure, make sure you do the following:  1) Avoid caffeine, exercise, or tobacco products for 30 minutes before checking. Empty your bladder. 2) Sit with your back supported in a flat-backed chair. Rest your arm on something flat (arm of the chair, table, etc). 3) Sit still with your feet flat on the floor, resting, for at least 5 minutes.  4) Check your blood pressure. Take 1-2 readings.  5) Write down these readings and bring with you to any provider appointments.  Bring your home blood pressure machine with you to a provider's office for accuracy comparison at least once a year.   Make sure you take your blood pressure medications before you come to any office visit, even if you were asked to fast for labs.  Estelle Grumbles, PharmD, [facility] Clinical Pharmacist [facility] [phone]

## 2023-05-18 NOTE — Progress Notes (Signed)
 05/18/2023 Name: Esaias Cleavenger. MRN: [id number] DOB: [birth year]  Chief Complaint  Patient presents with   Medication Management   Medication Assistance   Medication Adherence    Latavius Breslin Hemann. is a 68 y.o. year old male who presented for a telephone visit.   They were referred to the pharmacist by their PCP for assistance in managing diabetes and hypertension.      Subjective:   Care Team: Primary Care Provider: [name], NP ; Next Scheduled Visit: 07/13/2023 Cardiologist: Mirian Capuchin, MD; Next Scheduled Visit: 08/19/2023 Urologist: Vanna Scotland, MD Nurse Care Manager: Juanell Fairly, RN; Next Scheduled Visit: 05/18/2023   Medication Access/Adherence  Current Pharmacy:  [facility] - [place], [place] - [address] [place] [address] Phone: [phone] Fax: [phone]  [facility] [phone] [place], [place] - [address] AT [facility] [address] [place] [address] Phone: [phone] Fax: [phone]   Patient reports affordability concerns with their medications: No  Patient reports access/transportation concerns to their pharmacy: No  Patient reports adherence concerns with their medications:  No     Query medication adherence based on dispensing history in chart: - amlodipine last filled 12/16/2022 for 90 day supply - glipizide ER last filled 05/15/2023, but previous refill for 90 day supply was 12/16/2022 - isosorbide ER last filled 02/03/2023 - rosuvastatin last filled 12/16/2022 for 90 day supply  Reports takes his medications directly from pill bottles and has a routine to aid with adherence; denies missed doses    Reports having a difficult time recently since allowed his ex-girlfriend move in with him. Reports has not been keeping to self-monitoring recently   Diabetes:   Current medications:  Glipizide 10 mg twice daily before breakfast and supper (30  minutes prior to meal) Metformin 850 mg twice daily Rybelsus 7 mg daily  Confirms taking on an empty stomach, >=30 minutes before the first food, beverage, or other oral medications of the day with <=4 oz of plain water only   Medications tried in the past: Rybelsus (cost); Ozempic (cost); Evaristo Bury (cost)    Denies checking home blood sugar recently   Denies symptoms of hypoglycemia     Statin therapy: rosuvastatin 20 mg daily   Current Physical Activity: Denies going to the gym recently, previously walking on treadmill for 30 minutes and then weight machines for 30 minutes    Current medication access support: enrolled in patient assistance for Rybelsus from [employer] through 02/10/2024 - Reports has >1 month supply of Rybelsus remaining   Hypertension:   Current medications:  amlodipine 10 mg daily metoprolol 50 mg twice daily Losartan 25 mg daily Isosorbide ER 30 mg daily    Patient has a validated, automated, upper arm home BP cuff Denies checking home BP recently   Attributes elevated readings to eating saltier food (fast)   Patient denies hypotensive s/sx including dizziness, lightheadedness.    Current Physical Activity: Denies going to the gym recently, previously walking on treadmill for 30 minutes and then weight machines for 30 minutes    Objective:  Lab Results  Component Value Date   HGBA1C 7.4 (A) 01/12/2023    Lab Results  Component Value Date   CREATININE 0.99 01/12/2023   BUN 15 01/12/2023   NA 138 01/12/2023   K 4.9 01/12/2023   CL 104 01/12/2023   CO2 26 01/12/2023    Lab  Results  Component Value Date   CHOL 142 01/12/2023   HDL 47 01/12/2023   LDLCALC 79 01/12/2023   TRIG 82 01/12/2023   CHOLHDL 3.0 01/12/2023   BP Readings from Last 3 Encounters:  01/12/23 128/74  07/01/22 134/62  03/27/22 118/70   Pulse Readings from Last 3 Encounters:  07/01/22 85  03/27/22 77  01/07/22 89     Medications Reviewed Today     Reviewed  by [name], RPH-CPP (Pharmacist) on 05/18/23 at 1142  Med List Status: <None>   Medication Order Taking? Sig Documenting Provider Last Dose Status Informant  ACCU-CHEK GUIDE TEST test strip [id number]  TEST BLOOD SUGAR ONE TIME DAILY [name], NP  Active   Accu-Chek Softclix Lancets lancets [id number]  Use to check Blood sugar once a day.  DX: E11.9 [name], NP  Active   Alcohol Swabs (DROPSAFE ALCOHOL PREP) 70 % PADS [id number]  USE AS DIRECTED TO CHECK BLOOD SUGAR ONCE A DAY [name], NP  Active   amLODipine (NORVASC) 10 MG tablet [id number]  TAKE 1 TABLET EVERY DAY [name], NP  Active   aspirin EC 81 MG tablet [id number]  81 mg daily. Swallow whole. [provider]  Active   blood glucose meter kit and supplies [id number]  Dispense based on patient and insurance preference. Use to check blood sugar daily as directed. DX Ell.9 [name], NP  Active   Blood Glucose Monitoring Suppl (ACCU-CHEK GUIDE) w/Device KIT [id number]  Use to check blood sugar daily for type 2 diabetes E11.9 [name], NP  Active   Cholecalciferol 125 MCG (5000 UT) capsule [id number]  Take 5,000 Units by mouth daily. [provider]  Active   doxylamine, Sleep, (UNISOM) 25 MG tablet [id number]  Take 25 mg by mouth at bedtime as needed. [provider]  Active   glipiZIDE (GLUCOTROL) 10 MG tablet [id number] Yes TAKE 1 TABLET TWICE DAILY BEFORE MEALS Baity, [name], NP Taking Active   isosorbide mononitrate (IMDUR) 30 MG 24 hr tablet [id number]  Take 1 tablet (30 mg total) by mouth once daily [provider]  Active   losartan (COZAAR) 25 MG tablet [id number]  Take 1 tablet by mouth daily. [provider]  Active   metFORMIN (GLUCOPHAGE) 850 MG tablet [id number] Yes TAKE 1 TABLET(850 MG) BY MOUTH TWICE DAILY WITH A MEAL Baity, [name], NP Taking Active   metoprolol tartrate (LOPRESSOR) 50 MG tablet [id number]  Take 1 tablet by mouth 2 (two)  times daily. [provider]  Active   rosuvastatin (CRESTOR) 20 MG tablet [id number] Yes TAKE 1 TABLET(20 MG) BY MOUTH DAILY [name], NP Taking Active   Semaglutide 7 MG TABS [id number] Yes Take 7 mg by mouth daily. [provider] Taking Active               Assessment/Plan:   Recommend patient restart using weekly pillbox as adherence aid  Discuss importance of medication adherence. Patient states will check his supply of amlodipine, isosorbide and rosuvastatin when returns home and contact pharmacy for refills if needed   Diabetes: - Currently controlled - Have reviewed long term cardiovascular and renal outcomes of uncontrolled blood sugar - Have reviewed goal A1c, goal fasting, and goal 2 hour post prandial glucose - Encouraged dietary modifications including: Encouraged patient to have regular well-balance meals throughout the day (including breakfast), while controlling carbohydrate portion sizes Encouraged patient to review nutrition labels for carbohydrate  content of foods - Have counseled on s/s of low blood sugar and how to treat lows Patient carries glucose tablets with him in case needed for hypoglycemia - Recommend to restart checking glucose, keep a log of the results, have this record to review during future appointments, but to contact office sooner if needed for readings outside of established parameters or symptoms  - Patient to follow up with [employer] patient assistance program as needed for refills of Rybelsus    Hypertension: - Currently controlled - Counsel on impact of salt/sodium on blood pressure. Encourage patient to limit intake of salt and fast food - Recommended to restart checking home blood pressure and heart rate using upper arm monitor, keep a log of the results and have this record to review during future appointments        Follow Up Plan: Clinical Pharmacist will follow up with patient by telephone on 06/19/2023 at  11:30 AM     Estelle Grumbles, PharmD, [facility] Clinical Pharmacist [facility] [phone]

## 2023-05-18 NOTE — Patient Outreach (Signed)
 Complex Care Management   Visit Note  05/18/2023  Name:  Daniel Bradley. MRN: [id number] DOB: 1955-05-02  Situation: Referral received for Complex Care Management related to  DM, HTN and HLD  I obtained verbal consent from Mr. Tool.  Visit completed with Mr. 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   Elevated prostate specific antigen (PSA) 02/21/2013   Hemorrhoid    History of kidney stones    Hyperlipidemia    Hypertension    Incomplete bladder emptying 01/10/2013   Malignant neoplasm of prostate (HCC) 04/28/2013   prostate removed   Myocardial infarction (HCC) 2019   2 stents placed   Obstructive sleep apnea on CPAP    does not use cpap, does not snore much since loosing 80 lbs   Prostate cancer (HCC)     Assessment: Patient Reported Symptoms:  Cognitive Able to follow simple commands, Alert and oriented to person, place, and time, Normal speech and language skills, Insightful and able to interpret abstract concepts  Neurological No symptoms reported    HEENT No symptoms reported    Cardiovascular No symptoms reported    Respiratory No symptoms reported    Endocrine No symptoms reported    Gastrointestinal No symptoms reported    Genitourinary No symptoms reported    Integumentary No symptoms reported    Musculoskeletal No symptoms reported    Psychosocial No symptoms reported     Vitals:    Medications Reviewed Today     Reviewed by Juanell Fairly, RN (Registered Nurse) on 05/18/23 at 1441  Med List Status: <None>   Medication Order Taking? Sig Documenting Provider Last Dose Status Informant  ACCU-CHEK GUIDE TEST test strip [id number]  TEST BLOOD SUGAR ONE TIME DAILY [name], NP   Active   Accu-Chek Softclix Lancets lancets [id number] No Use to check Blood sugar once a day.  DX: E11.9 [name], NP Taking Active   Alcohol Swabs (DROPSAFE ALCOHOL PREP) 70 % PADS [id number] No USE AS DIRECTED TO CHECK BLOOD SUGAR ONCE A DAY [name], NP Taking Active   amLODipine (NORVASC) 10 MG tablet [id number] No TAKE 1 TABLET EVERY DAY [name], NP Taking Active   aspirin EC 81 MG tablet [id number] No 81 mg daily. Swallow whole. [provider] Taking Active   blood glucose meter kit and supplies [id number] No Dispense based on patient and insurance preference. Use to check blood sugar daily as directed. DX Ell.9 [name], NP Taking Active   Blood Glucose Monitoring Suppl (ACCU-CHEK GUIDE) w/Device KIT [id number] No Use to check blood sugar daily for type 2 diabetes E11.9 [name], NP Taking Active   Cholecalciferol 125 MCG (5000 UT) capsule [id number] No Take 5,000 Units by mouth daily. [provider] Taking Active   doxylamine, Sleep, (UNISOM) 25 MG tablet [id number] No Take 25 mg by mouth at bedtime as needed. [provider] Taking Active   glipiZIDE (GLUCOTROL) 10 MG tablet [id number] No TAKE 1 TABLET TWICE DAILY BEFORE MEALS Baity, [name], NP Taking Active   isosorbide mononitrate (IMDUR) 30 MG 24 hr tablet [id number] No Take 1 tablet (30 mg total) by mouth once daily [provider] Taking Active   losartan (COZAAR) 25 MG tablet [id number] No Take 1 tablet by mouth daily. [provider] Taking Active   metFORMIN (GLUCOPHAGE) 850 MG tablet [id number] No TAKE 1 TABLET(850 MG) BY MOUTH TWICE DAILY WITH A MEAL Baity, [name], NP Taking Active   metoprolol tartrate (LOPRESSOR) 50 MG tablet [id number] No Take 1 tablet by mouth 2 (two) times daily. [provider] Taking Active   rosuvastatin (CRESTOR) 20 MG tablet [id number] No TAKE 1 TABLET(20 MG) BY MOUTH DAILY [name], NP Taking Active   Semaglutide 7  MG TABS [id number] No Take 7 mg by mouth daily. [provider] Taking Active             Recommendation:   PCP Follow-up as scheduled on 07/13/23.  Follow Up Plan:   Telephone follow up appointment date/time on Jul 01, 2023 at 2 pm.    Juanell Fairly [facility] RN Care Manager Direct Dial: [phone]  Fax: [phone] Website: [email or web address]

## 2023-06-01 ENCOUNTER — Other Ambulatory Visit: Payer: Self-pay | Admitting: Internal Medicine

## 2023-06-01 NOTE — Telephone Encounter (Signed)
 Copied from CRM [id number]. Topic: Clinical - Medication Refill >> Jun 01, 2023 11:15 AM Rosaria Common wrote: Most Recent Primary Care Visit:  Provider: Ardis Becton  Department: [facility]  Visit Type: PATIENT OUTREACH 30  Date: 05/18/2023  Medication: Rybelsus   Has the patient contacted their pharmacy? No (Agent: If no, request that the patient contact the pharmacy for the refill. If patient does not wish to contact the pharmacy document the reason why and proceed with request.) (Agent: If yes, when and what did the pharmacy advise?)  Is this the correct pharmacy for this prescription? Yes If no, delete pharmacy and type the correct one.  This is the patient's preferred pharmacy:    [facility] #[id number] - [place], [place] - [address] AT [facility] OF [address] [place] [address] [place] [address] Phone: [phone] Fax: [phone]   Has the prescription been filled recently? Yes  Is the patient out of the medication? Yes  Has the patient been seen for an appointment in the last year OR does the patient have an upcoming appointment? Yes  Can we respond through MyChart? Yes  Agent: Please be advised that Rx refills may take up to 3 business days. We ask that you follow-up with your pharmacy.

## 2023-06-02 NOTE — Telephone Encounter (Signed)
 Requested Prescriptions  Pending Prescriptions Disp Refills   Accu-Chek Softclix Lancets lancets [Pharmacy Med Name: Accu-Chek Softclix Lancets Miscellaneous] 100 each 3    Sig: TEST BLOOD SUGAR ONE TIME DAILY     Endocrinology: Diabetes - Testing Supplies Failed - 06/02/2023 10:15 AM      Failed - Valid encounter within last 12 months    Recent Outpatient Visits   None     Future Appointments             In 1 month Baity, Rankin Buzzard, NP [facility], [facility]

## 2023-06-02 NOTE — Telephone Encounter (Signed)
 Requested medication (s) are due for refill today: review  Requested medication (s) are on the active medication list: yes  Last refill:    Future visit scheduled: yes  Notes to clinic:  Requesting refill for Rybelsus  Historical provider/medication, please review for refill       Requested Prescriptions  Signed Prescriptions Disp Refills   Accu-Chek Softclix Lancets lancets 100 each 1    Sig: TEST BLOOD SUGAR ONE TIME DAILY     Endocrinology: Diabetes - Testing Supplies Failed - 06/02/2023 10:15 AM      Failed - Valid encounter within last 12 months    Recent Outpatient Visits   None     Future Appointments             In 1 month Baity, Rankin Buzzard, NP [facility], [facility]

## 2023-06-10 ENCOUNTER — Telehealth: Payer: Self-pay

## 2023-06-10 NOTE — Telephone Encounter (Signed)
 Copied from CRM [id number]. Topic: Clinical - Prescription Issue >> Jun 10, 2023  8:21 AM Kita Perish H wrote: Reason for CRM: Patient is calling regarding his Rybelsus  states that per [employer] they need a letter stating the office lost medication so that they can send another refill, states someone signed for the medication at the office on 4/1, please reach out to patient, thanks.  Dorla Gartner [phone]

## 2023-06-19 ENCOUNTER — Other Ambulatory Visit: Admitting: Pharmacist

## 2023-06-19 DIAGNOSIS — E1169 Type 2 diabetes mellitus with other specified complication: Secondary | ICD-10-CM

## 2023-06-19 DIAGNOSIS — E119 Type 2 diabetes mellitus without complications: Secondary | ICD-10-CM

## 2023-06-19 NOTE — Progress Notes (Signed)
 06/19/2023 Name: Daniel Wenk Jr. MRN: [id number] DOB: 1956-01-14  Chief Complaint  Patient presents with   Medication Management   Medication Assistance   Medication Adherence    Daniel Mehringer Jr. is a 68 y.o. year old male who presented for a telephone visit.   They were referred to the pharmacist by their PCP for assistance in managing diabetes and hypertension.      Subjective:   Care Team: Primary Care Provider: Carollynn Cirri, NP ; Next Scheduled Visit: 07/13/2023 Cardiologist: Daniel Boon, MD; Next Scheduled Visit: 08/19/2023 Urologist: Daniel Gimenez, MD Nurse Care Manager: Daniel Cord, RN; Next Scheduled Visit: 07/01/2023    Medication Access/Adherence  Current Pharmacy:  [facility] - [place], [place] - [address] [place] [address] Phone: [phone] Fax: [phone]  [facility] STORE [phone] [place], [place] - [address] AT [facility] OF [address] [place] [address] Phone: [phone] Fax: [phone]   Patient reports affordability concerns with their medications: No  Patient reports access/transportation concerns to their pharmacy: No  Patient reports adherence concerns with their medications:  No     Query medication adherence based on dispensing history in chart: - amlodipine  last filled 12/16/2022 for 90 day supply - glipizide  ER last filled 05/15/2023, but previous refill for 90 day supply was 12/16/2022 - isosorbide ER last filled 02/03/2023 - rosuvastatin  last filled 12/16/2022 for 90 day supply  Reports up a refill a couple of weeks ago from [employer] takes his medications directly from pill bottles and has a routine to aid with adherence; reports misses a dose ~once to twice/month    Diabetes:   Current medications:  Glipizide  10 mg twice daily before breakfast and supper (30 minutes prior to meal) Metformin  850 mg twice daily Rybelsus  7  mg daily  Confirms taking on an empty stomach, >=30 minutes before the first food, beverage, or other oral medications of the day with <=4 oz of plain water only   Medications tried in the past: Rybelsus  (cost); Ozempic  (cost); Tresiba  (cost)    Reports recent morning fasting readings ranging 96-131   Denies symptoms of hypoglycemia     Statin therapy: rosuvastatin  20 mg daily   Current Physical Activity: Denies going to the gym recently, previously walking on treadmill for 30 minutes and then weight machines for 30 minutes    Current medication access support: enrolled in patient assistance for Rybelsus  from [employer] through 02/10/2024 - Reports recently received refill of Rybelsus  from assistance program   Hypertension:   Current medications:  amlodipine  10 mg daily metoprolol  50 mg twice daily Losartan 25 mg daily Isosorbide ER 30 mg daily    Patient has a validated, automated, upper arm home BP cuff Reports recently blood pressure ranging 120-132/74-81   Attributes elevated readings to eating saltier food (fast)   Patient denies hypotensive s/sx including dizziness, lightheadedness.    Current Physical Activity: Returned to the gym walking on treadmill for 30 minutes and then weight machines for 30 minutes x 3-4 days/week   Objective:  Lab Results  Component Value Date   HGBA1C 7.4 (A) 01/12/2023    Lab Results  Component Value Date   CREATININE 0.99 01/12/2023   BUN 15 01/12/2023   NA 138 01/12/2023   K 4.9 01/12/2023   CL 104 01/12/2023   CO2 26 01/12/2023    Lab Results  Component Value  Date   CHOL 142 01/12/2023   HDL 47 01/12/2023   LDLCALC 79 01/12/2023   TRIG 82 01/12/2023   CHOLHDL 3.0 01/12/2023   BP Readings from Last 3 Encounters:  01/12/23 128/74  07/01/22 134/62  03/27/22 118/70   Pulse Readings from Last 3 Encounters:  07/01/22 85  03/27/22 77  01/07/22 89       Medications Reviewed Today     Reviewed by Daniel Bradley, RPH-CPP (Pharmacist) on 06/19/23 at 1147  Med List Status: <None>   Medication Order Taking? Sig Documenting Provider Last Dose Status Informant  ACCU-CHEK GUIDE TEST test strip [id number]  TEST BLOOD SUGAR ONE TIME DAILY Daniel Cirri, NP  Active   Accu-Chek Softclix Lancets lancets [id number]  TEST BLOOD SUGAR ONE TIME DAILY Daniel Cirri, NP  Active   Alcohol Swabs (DROPSAFE ALCOHOL PREP) 70 % PADS [id number]  USE AS DIRECTED TO CHECK BLOOD SUGAR ONCE A DAY Daniel Cirri, NP  Active   amLODipine  (NORVASC ) 10 MG tablet [id number]  TAKE 1 TABLET EVERY DAY Daniel Cirri, NP  Active   aspirin  EC 81 MG tablet [id number]  81 mg daily. Swallow whole. [provider]  Active   blood glucose meter kit and supplies [id number]  Dispense based on patient and insurance preference. Use to check blood sugar daily as directed. DX Ell.9 Daniel Cirri, NP  Active   Blood Glucose Monitoring Suppl (ACCU-CHEK GUIDE) w/Device KIT [id number]  Use to check blood sugar daily for type 2 diabetes E11.9 Daniel Cirri, NP  Active   Cholecalciferol  125 MCG (5000 UT) capsule [id number]  Take 5,000 Units by mouth daily. [provider]  Active   doxylamine, Sleep, (UNISOM) 25 MG tablet [id number]  Take 25 mg by mouth at bedtime as needed. [provider]  Active   glipiZIDE  (GLUCOTROL ) 10 MG tablet [id number] Yes TAKE 1 TABLET TWICE DAILY BEFORE MEALS Baity, Rankin Buzzard, NP Taking Active   isosorbide mononitrate (IMDUR) 30 MG 24 hr tablet [id number]  Take 1 tablet (30 mg total) by mouth once daily [provider]  Active   losartan (COZAAR) 25 MG tablet [id number]  Take 1 tablet by mouth daily. [provider]  Active   metFORMIN  (GLUCOPHAGE ) 850 MG tablet [id number] Yes TAKE 1 TABLET(850 MG) BY MOUTH TWICE DAILY WITH A MEAL Baity, Rankin Buzzard, NP Taking Active   metoprolol  tartrate (LOPRESSOR ) 50 MG tablet [id number]  Take 1 tablet by mouth 2 (two) times daily. [provider]  Active   rosuvastatin  (CRESTOR ) 20 MG tablet [id number]  TAKE 1 TABLET(20 MG) BY MOUTH DAILY Daniel Cirri, NP  Active   Semaglutide  7 MG TABS [id number] Yes Take 7 mg by mouth daily. [provider] Taking Active               Assessment/Plan:   Recommend patient restart using weekly pillbox as adherence aid, but patient prefers to using his current strategy of moving his pill bottles to aid with adherence. Recommend using medication list also to aid with adherence    Diabetes: - Currently controlled - Have reviewed long term cardiovascular and renal outcomes of uncontrolled blood sugar - Have reviewed goal A1c, goal fasting, and goal 2 hour post prandial glucose - Encouraged dietary modifications including: Encouraged patient to have regular well-balance meals throughout the day (including breakfast), while controlling carbohydrate portion sizes Encouraged patient to review nutrition labels for carbohydrate content of foods - Have counseled on  s/s of low blood sugar and how to treat lows Patient carries glucose tablets with him in case needed for hypoglycemia - Recommend to check glucose, keep a log of the results, have this record to review during future appointments, but to contact office sooner if needed for readings outside of established parameters or symptoms  - Patient to follow up with [employer] patient assistance program as needed for refills of Rybelsus      Hypertension: - Currently controlled - Counsel on impact of salt/sodium on blood pressure. Encourage patient to limit intake of salt and fast food - Recommended to check home blood pressure and heart rate using upper arm monitor, keep a log of the results and have this record to review during future appointments        Follow Up Plan: Clinical Pharmacist will follow up with patient by telephone on 09/25/2023 at 11:30 AM      Arthur Lash, PharmD, [facility] Clinical Pharmacist [facility] [phone]

## 2023-06-19 NOTE — Patient Instructions (Signed)
 Goals Addressed             This Visit's Progress    Pharmacy Goals       If you need to reach out to patient assistance programs regarding refills or to find out the status of your application, you can do so by calling:  [employer] at [phone]  Our goal A1c is less than 7%. This corresponds with fasting sugars less than 130 and 2 hour after meal sugars less than 180. Please keep a log of your results when checking your blood sugar   Our goal bad cholesterol, or LDL, is less than 70 . This is why it is important to continue taking your rosuvastatin.  Check your blood pressure twice weekly, and any time you have concerning symptoms like headache, chest pain, dizziness, shortness of breath, or vision changes.   Our goal is less than 130/80.  To appropriately check your blood pressure, make sure you do the following:  1) Avoid caffeine, exercise, or tobacco products for 30 minutes before checking. Empty your bladder. 2) Sit with your back supported in a flat-backed chair. Rest your arm on something flat (arm of the chair, table, etc). 3) Sit still with your feet flat on the floor, resting, for at least 5 minutes.  4) Check your blood pressure. Take 1-2 readings.  5) Write down these readings and bring with you to any provider appointments.  Bring your home blood pressure machine with you to a provider's office for accuracy comparison at least once a year.   Make sure you take your blood pressure medications before you come to any office visit, even if you were asked to fast for labs.  Estelle Grumbles, PharmD, [facility] Clinical Pharmacist [facility] [phone]

## 2023-06-22 ENCOUNTER — Telehealth: Payer: Self-pay

## 2023-06-22 MED ORDER — METHOCARBAMOL 500 MG PO TABS: 500.0000 mg | ORAL_TABLET | Freq: Three times a day (TID) | ORAL | 0 refills | Status: DC | PRN

## 2023-06-22 NOTE — Telephone Encounter (Signed)
 Copied from CRM [id number]. Topic: Clinical - Medication Question >> Jun 22, 2023  2:56 PM Georgeann Kindred wrote: Reason for CRM: Patient called requesting a prescription for muscle relaxers. Patient states that he has 2 ruptured disk and it has been keeping him from standing up straight. He states that due to the pain in the right side it prevents him from walking. For additional information please contact patient at [phone].

## 2023-06-22 NOTE — Addendum Note (Signed)
 Addended by: Carollynn Cirri on: 06/22/2023 09:36 PM   Modules accepted: Orders

## 2023-06-22 NOTE — Telephone Encounter (Signed)
Methocarbamol sent to pharmacy

## 2023-06-23 NOTE — Telephone Encounter (Signed)
 Patient notified prescription sent to pharmacy.

## 2023-06-30 ENCOUNTER — Other Ambulatory Visit: Payer: Self-pay | Admitting: Internal Medicine

## 2023-07-01 ENCOUNTER — Other Ambulatory Visit: Payer: Self-pay | Admitting: Internal Medicine

## 2023-07-01 ENCOUNTER — Telehealth: Payer: Self-pay

## 2023-07-01 NOTE — Telephone Encounter (Signed)
 Requested medication (s) are due for refill today - no  Requested medication (s) are on the active medication list -yes  Future visit scheduled -yes  Last refill: 09/12/22 #100 3RF  Notes to clinic: off protocol- provider review   Requested Prescriptions  Pending Prescriptions Disp Refills   Alcohol Swabs (DROPSAFE ALCOHOL PREP) 70 % PADS [Pharmacy Med Name: DropSafe Alcohol Prep Pad 70 %] 100 each 3    Sig: USE AS DIRECTED TO CHECK BLOOD SUGAR ONE TIME DAILY     Off-Protocol Failed - 07/01/2023  2:01 PM      Failed - Medication not assigned to a protocol, review manually.      Failed - Valid encounter within last 12 months    Recent Outpatient Visits   None     Future Appointments             In 1 week Baity, Rankin Buzzard, NP [facility], [facility]               Requested Prescriptions  Pending Prescriptions Disp Refills   Alcohol Swabs (DROPSAFE ALCOHOL PREP) 70 % PADS [Pharmacy Med Name: DropSafe Alcohol Prep Pad 70 %] 100 each 3    Sig: USE AS DIRECTED TO CHECK BLOOD SUGAR ONE TIME DAILY     Off-Protocol Failed - 07/01/2023  2:01 PM      Failed - Medication not assigned to a protocol, review manually.      Failed - Valid encounter within last 12 months    Recent Outpatient Visits   None     Future Appointments             In 1 week Baity, Rankin Buzzard, NP  [facility], [facility]

## 2023-07-02 NOTE — Telephone Encounter (Signed)
 Requested Prescriptions  Pending Prescriptions Disp Refills   amLODipine  (NORVASC ) 10 MG tablet [Pharmacy Med Name: amLODIPine  Besylate Oral Tablet 10 MG] 90 tablet 0    Sig: TAKE 1 TABLET EVERY DAY     Cardiovascular: Calcium  Channel Blockers 2 Failed - 07/02/2023  9:47 AM      Failed - Valid encounter within last 6 months    Recent Outpatient Visits   None     Future Appointments             In 1 week Baity, Rankin Buzzard, NP [facility], [facility]            Passed - Last BP in normal range    BP Readings from Last 1 Encounters:  01/12/23 128/74         Passed - Last Heart Rate in normal range    Pulse Readings from Last 1 Encounters:  07/01/22 85          rosuvastatin  (CRESTOR ) 20 MG tablet [Pharmacy Med Name: Rosuvastatin  Calcium  Oral Tablet 20 MG] 90 tablet 0    Sig: TAKE 1 TABLET EVERY DAY     Cardiovascular:  Antilipid - Statins 2 Failed - 07/02/2023  9:47 AM      Failed - Valid encounter within last 12 months    Recent Outpatient Visits   None     Future Appointments             In 1 week Carollynn Cirri, NP [facility], [facility]            Failed - Lipid Panel in normal range within the last 12 months    Cholesterol, Total  Date Value Ref Range Status  03/30/2020 128 100 - 199 mg/dL Final   Cholesterol  Date Value Ref Range Status  01/12/2023 142 <200 mg/dL Final   LDL Cholesterol (Calc)  Date Value Ref Range Status  01/12/2023 79 mg/dL (calc) Final    Comment:    Reference range: <100 . Desirable range <100 mg/dL for primary prevention;   <70 mg/dL for patients with CHD or diabetic patients  with > or = 2 CHD risk factors. Aaron Aas LDL-C is now calculated using the Martin-Hopkins  calculation, which is a validated novel method providing  better accuracy than the Friedewald equation in the  estimation of LDL-C.  Melinda Sprawls et al. Erroll Heard. 0981;191(47): 2061-2068   (http://education.[facility].com/faq/FAQ164)    HDL  Date Value Ref Range Status  01/12/2023 47 > OR = 40 mg/dL Final  82/95/6213 41 >08 mg/dL Final   Triglycerides  Date Value Ref Range Status  01/12/2023 82 <150 mg/dL Final         Passed - Cr in normal range and within 360 days    Creat  Date Value Ref Range Status  01/12/2023 0.99 0.70 - 1.35 mg/dL Final   Creatinine, Urine  Date Value Ref Range Status  01/12/2023 184 20 - 320 mg/dL Final         Passed - Patient is not pregnant

## 2023-07-13 ENCOUNTER — Ambulatory Visit: Payer: Self-pay | Admitting: Internal Medicine

## 2023-07-13 NOTE — Progress Notes (Deleted)
 Subjective:    Patient ID: Daniel Imus., male    DOB: [birth year], 68 y.o.   MRN: [id number]  HPI  Patient presents to clinic today for 77-month follow-up of chronic conditions.  HTN: His BP today is 134/62.  He is taking amlodipine , losartan and metoprolol  as prescribed.  ECG from 03/2020 reviewed.  HLD with CAD/aortic atherosclerosis status post MI: His last LDL was 79, triglycerides 82, 01/2023.  He denies myalgias on rosuvastatin .  He is taking isosorbide, metoprolol  and aspirin  as well.  He tries to consume a low-fat diet.  He follows with cardiology.  DM2: His last A1c was 7.4 %, 01/2023.  He is taking rybelsus , metformin  and glipizide  as prescribed.  His sugars range 90-134.  He checks his feet routinely.  His last eye exam was 10/2022.  Flu 12/2022.  Pneumovax never.  Prevnar 20 12/2020.  COVID Pfizer x 2.  OSA: He averages 7 hours of sleep per night without the use of the CPAP.  He takes unisom as needed to help him sleep.  Sleep study from 09/2017 reviewed.  OA: Mainly in his back and right hip.  He takes tylenol  as needed with some relief of symptoms.  He does not follow with orthopedics.  ED: He is not currently taking any medications for this.  He does not follow with urology.  Depression: Situational.  He is not currently taking any medications for this.  He is not currently seeing a therapist.  He denies anxiety, SI/HI.  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   Elevated prostate specific antigen (PSA) 02/21/2013   Hemorrhoid    History of kidney stones    Hyperlipidemia    Hypertension    Incomplete bladder emptying 01/10/2013   Malignant neoplasm of prostate (HCC) 04/28/2013   prostate removed   Myocardial infarction  (HCC) 2019   2 stents placed   Obstructive sleep apnea on CPAP    does not use cpap, does not snore much since loosing 80 lbs   Prostate cancer (HCC)     Current Outpatient Medications  Medication Sig Dispense Refill   ACCU-CHEK GUIDE TEST test strip TEST BLOOD SUGAR ONE TIME DAILY 100 strip 3   Accu-Chek Softclix Lancets lancets TEST BLOOD SUGAR ONE TIME DAILY 100 each 1   Alcohol Swabs (DROPSAFE ALCOHOL PREP) 70 % PADS USE AS DIRECTED TO CHECK BLOOD SUGAR ONE TIME DAILY 100 each 3   amLODipine  (NORVASC ) 10 MG tablet TAKE 1 TABLET EVERY DAY 90 tablet 0   aspirin  EC 81 MG tablet 81 mg daily. Swallow whole.     blood glucose meter kit and supplies Dispense based on patient and insurance preference. Use to check blood sugar daily as directed. DX Ell.9 1 each 0   Blood Glucose Monitoring Suppl (ACCU-CHEK GUIDE) w/Device KIT Use to check blood sugar daily for type 2 diabetes E11.9 1 kit 0   Cholecalciferol  125 MCG (5000 UT) capsule Take 5,000 Units by mouth daily.     doxylamine, Sleep, (UNISOM) 25 MG tablet Take 25 mg by mouth at bedtime as needed.     glipiZIDE  (GLUCOTROL ) 10 MG tablet TAKE 1 TABLET TWICE DAILY BEFORE MEALS 180 tablet 0  isosorbide mononitrate (IMDUR) 30 MG 24 hr tablet Take 1 tablet (30 mg total) by mouth once daily     losartan (COZAAR) 25 MG tablet Take 1 tablet by mouth daily.     metFORMIN  (GLUCOPHAGE ) 850 MG tablet TAKE 1 TABLET(850 MG) BY MOUTH TWICE DAILY WITH A MEAL 180 tablet 2   methocarbamol  (ROBAXIN ) 500 MG tablet Take 1 tablet (500 mg total) by mouth every 8 (eight) hours as needed. 30 tablet 0   metoprolol  tartrate (LOPRESSOR ) 50 MG tablet Take 1 tablet by mouth 2 (two) times daily.     rosuvastatin  (CRESTOR ) 20 MG tablet TAKE 1 TABLET EVERY DAY 90 tablet 0   Semaglutide  7 MG TABS Take 7 mg by mouth daily.     No current facility-administered medications for this visit.    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    Employer: [employer]    Comment: retired  Tobacco Use   Smoking status: Never   Smokeless tobacco: Never  Vaping Use   Vaping status: Never Used  Substance and Sexual Activity   Alcohol use: No    Alcohol/week: 0.0 standard drinks of alcohol    Comment: rare   Drug use: No   Sexual activity: Not Currently  Other Topics Concern   Not on file  Social History Narrative   Lives with girlfriend, April   Social Drivers of Health   Financial Resource Strain: High Risk (03/30/2023)   Overall Financial Resource Strain (CARDIA)    Difficulty of Paying Living Expenses: Hard  Food Insecurity: Food Insecurity Present (05/18/2023)   Hunger Vital Sign    Worried About Running Out of Food in the Last Year: Sometimes true    Ran Out of Food in the Last Year: Sometimes true  Transportation Needs: No Transportation Needs (05/18/2023)   PRAPARE - Administrator, Civil Service (Medical): No    Lack of Transportation (Non-Medical): No  Physical Activity: Inactive (05/18/2023)   Exercise Vital Sign    Days of Exercise per Week: 0 days    Minutes of Exercise per Session: 0 min  Stress: Stress Concern Present (05/18/2023)   [employer] of Occupational Health - Occupational Stress Questionnaire    Feeling of Stress : To some extent  Social Connections: Socially Isolated (05/18/2023)   Social Connection and Isolation Panel [NHANES]    Frequency of Communication with Friends and Family: Twice a week    Frequency of Social Gatherings with Friends and Family: Twice a week    Attends Religious  Services: Never    Database administrator or Organizations: No    Attends Banker Meetings: Never    Marital Status: Divorced  Catering manager Violence: Not At Risk (05/18/2023)   Humiliation, Afraid, Rape, and Kick questionnaire    Fear of Current or Ex-Partner: No    Emotionally Abused: No    Physically Abused: No  Sexually Abused: No     Constitutional: Denies fever, malaise, fatigue, headache or abrupt weight changes.  HEENT: Denies eye pain, eye redness, ear pain, ringing in the ears, wax buildup, runny nose, nasal congestion, bloody nose, or sore throat. Respiratory: Denies difficulty breathing, shortness of breath, cough or sputum production.   Cardiovascular: Denies chest pain, chest tightness, palpitations or swelling in the hands or feet.  Gastrointestinal: Denies abdominal pain, bloating, constipation, diarrhea or blood in the stool.  GU: Patient reports erectile dysfunction.  Denies urgency, frequency, pain with urination, burning sensation, blood in urine, odor or discharge. Musculoskeletal: Patient reports back and hip pain.  Denies decrease in range of motion, difficulty with gait, muscle pain or joint swelling.  Skin: Denies redness, rashes, lesions or ulcercations.  Neurological: Pt reports insomnia. Denies dizziness, difficulty with memory, difficulty with speech or problems with balance and coordination.  Psych: Pt has a history of depression. Denies anxiety, SI/HI.  No other specific complaints in a complete review of systems (except as listed in HPI above).  Objective:   Physical Exam  There were no vitals taken for this visit.  Wt Readings from Last 3 Encounters:  01/12/23 281 lb 6.4 oz (127.6 kg)  09/11/22 273 lb (123.8 kg)  07/01/22 273 lb (123.8 kg)    General: Appears his stated age, obese, in NAD. Skin: Warm, dry and intact. No ulcerations noted. HEENT: Head: normal shape and size; Eyes: sclera white, no icterus, conjunctiva pink, PERRLA  and EOMs intact;  Cardiovascular: Normal rate and rhythm. S1,S2 noted.  No murmur, rubs or gallops noted. No JVD or BLE edema. No carotid bruits noted. Pulmonary/Chest: Normal effort and positive vesicular breath sounds. No respiratory distress. No wheezes, rales or ronchi noted.  Musculoskeletal: No difficulty with gait.  Neurological: Alert and oriented. Coordination normal.  Psychiatric: Mood and affect flat.  Tearful. Judgment and thought content normal.     BMET    Component Value Date/Time   NA 138 01/12/2023 0907   NA 139 03/30/2020 1015   NA 139 04/21/2012 0641   K 4.9 01/12/2023 0907   K 3.8 04/21/2012 0641   CL 104 01/12/2023 0907   CL 107 04/21/2012 0641   CO2 26 01/12/2023 0907   CO2 27 04/21/2012 0641   GLUCOSE 146 (H) 01/12/2023 0907   GLUCOSE 212 (H) 04/21/2012 0641   BUN 15 01/12/2023 0907   BUN 14 03/30/2020 1015   BUN 22 (H) 04/21/2012 0641   CREATININE 0.99 01/12/2023 0907   CALCIUM  9.9 01/12/2023 0907   CALCIUM  8.7 04/21/2012 0641   GFRNONAA >60 06/23/2021 2133   GFRNONAA 51 (L) 04/21/2012 0641   GFRAA 100 03/30/2020 1015   GFRAA 59 (L) 04/21/2012 0641    Lipid Panel     Component Value Date/Time   CHOL 142 01/12/2023 0907   CHOL 128 03/30/2020 1015   TRIG 82 01/12/2023 0907   HDL 47 01/12/2023 0907   HDL 41 03/30/2020 1015   CHOLHDL 3.0 01/12/2023 0907   VLDL 14 08/20/2017 0359   LDLCALC 79 01/12/2023 0907    CBC    Component Value Date/Time   WBC 6.6 01/12/2023 0907   RBC 5.47 01/12/2023 0907   HGB 13.7 01/12/2023 0907   HGB 14.0 03/30/2020 1015   HCT 43.8 01/12/2023 0907   HCT 42.5 03/30/2020 1015   PLT 305 01/12/2023 0907   PLT 306 03/30/2020 1015   MCV 80.1 01/12/2023 0907   MCV 79 03/30/2020 1015   MCV  80 04/21/2012 0641   MCH 25.0 (L) 01/12/2023 0907   MCHC 31.3 (L) 01/12/2023 0907   RDW 14.5 01/12/2023 0907   RDW 14.6 03/30/2020 1015   RDW 14.9 (H) 04/21/2012 0641   LYMPHSABS 1.8 03/30/2020 1015   MONOABS 0.5 04/03/2019  0757   EOSABS 0.3 03/30/2020 1015   BASOSABS 0.1 03/30/2020 1015    Hgb A1C Lab Results  Component Value Date   HGBA1C 7.4 (A) 01/12/2023            Assessment & Plan:    RTC in 6 months for your annual exam Helayne Lo, NP

## 2023-07-14 ENCOUNTER — Ambulatory Visit (INDEPENDENT_AMBULATORY_CARE_PROVIDER_SITE_OTHER): Admitting: Internal Medicine

## 2023-07-14 ENCOUNTER — Encounter: Payer: Self-pay | Admitting: Internal Medicine

## 2023-07-14 VITALS — BP 124/72 | Ht 73.0 in | Wt 269.8 lb

## 2023-07-14 DIAGNOSIS — I25810 Atherosclerosis of coronary artery bypass graft(s) without angina pectoris: Secondary | ICD-10-CM

## 2023-07-14 DIAGNOSIS — E785 Hyperlipidemia, unspecified: Secondary | ICD-10-CM

## 2023-07-14 DIAGNOSIS — I10 Essential (primary) hypertension: Secondary | ICD-10-CM

## 2023-07-14 DIAGNOSIS — I700 Atherosclerosis of aorta: Secondary | ICD-10-CM | POA: Diagnosis not present

## 2023-07-14 DIAGNOSIS — E66812 Morbid (severe) obesity due to excess calories: Secondary | ICD-10-CM

## 2023-07-14 DIAGNOSIS — G4733 Obstructive sleep apnea (adult) (pediatric): Secondary | ICD-10-CM

## 2023-07-14 DIAGNOSIS — I252 Old myocardial infarction: Secondary | ICD-10-CM

## 2023-07-14 DIAGNOSIS — Z7984 Long term (current) use of oral hypoglycemic drugs: Secondary | ICD-10-CM

## 2023-07-14 DIAGNOSIS — Z8546 Personal history of malignant neoplasm of prostate: Secondary | ICD-10-CM

## 2023-07-14 DIAGNOSIS — F320 Major depressive disorder, single episode, mild: Secondary | ICD-10-CM

## 2023-07-14 DIAGNOSIS — E1169 Type 2 diabetes mellitus with other specified complication: Secondary | ICD-10-CM | POA: Diagnosis not present

## 2023-07-14 DIAGNOSIS — M545 Low back pain, unspecified: Secondary | ICD-10-CM

## 2023-07-14 DIAGNOSIS — M1611 Unilateral primary osteoarthritis, right hip: Secondary | ICD-10-CM

## 2023-07-14 DIAGNOSIS — N529 Male erectile dysfunction, unspecified: Secondary | ICD-10-CM

## 2023-07-14 LAB — CBC
HCT: 42.2 % (ref 38.5–50.0)
Hemoglobin: 13.3 g/dL (ref 13.2–17.1)
MCH: 25.6 pg — ABNORMAL LOW (ref 27.0–33.0)
MCHC: 31.5 g/dL — ABNORMAL LOW (ref 32.0–36.0)
MCV: 81.2 fL (ref 80.0–100.0)
MPV: 10.3 fL (ref 7.5–12.5)
Platelets: 290 10*3/uL (ref 140–400)
RBC: 5.2 10*6/uL (ref 4.20–5.80)
RDW: 15.3 % — ABNORMAL HIGH (ref 11.0–15.0)
WBC: 6.8 10*3/uL (ref 3.8–10.8)

## 2023-07-14 LAB — COMPREHENSIVE METABOLIC PANEL WITH GFR
AG Ratio: 1.5 (calc) (ref 1.0–2.5)
ALT: 14 U/L (ref 9–46)
AST: 14 U/L (ref 10–35)
Albumin: 4.3 g/dL (ref 3.6–5.1)
Alkaline phosphatase (APISO): 78 U/L (ref 35–144)
BUN: 17 mg/dL (ref 7–25)
CO2: 23 mmol/L (ref 20–32)
Calcium: 9.8 mg/dL (ref 8.6–10.3)
Chloride: 106 mmol/L (ref 98–110)
Creat: 0.89 mg/dL (ref 0.70–1.35)
Globulin: 2.8 g/dL (ref 1.9–3.7)
Glucose, Bld: 87 mg/dL (ref 65–99)
Potassium: 4.4 mmol/L (ref 3.5–5.3)
Sodium: 138 mmol/L (ref 135–146)
Total Bilirubin: 0.6 mg/dL (ref 0.2–1.2)
Total Protein: 7.1 g/dL (ref 6.1–8.1)
eGFR: 94 mL/min/{1.73_m2} (ref >=60)

## 2023-07-14 LAB — LIPID PANEL
Cholesterol: 128 mg/dL (ref <200)
HDL: 51 mg/dL (ref >=40)
LDL Cholesterol (Calc): 61 mg/dL
Non-HDL Cholesterol (Calc): 77 mg/dL (ref <130)
Total CHOL/HDL Ratio: 2.5 (calc) (ref <5.0)
Triglycerides: 84 mg/dL (ref <150)

## 2023-07-14 LAB — HEMOGLOBIN A1C
Hgb A1c MFr Bld: 6.9 % — ABNORMAL HIGH (ref <5.7)
Mean Plasma Glucose: 151 mg/dL
eAG (mmol/L): 8.4 mmol/L

## 2023-07-14 MED ORDER — METHOCARBAMOL 500 MG PO TABS: 500.0000 mg | ORAL_TABLET | Freq: Three times a day (TID) | ORAL | 0 refills | Status: AC | PRN

## 2023-07-14 NOTE — Assessment & Plan Note (Addendum)
 C-Met and lipid profile today Encouraged him to consume a low-fat, low-carb diet Continue amlodipine , olmesartan, metoprolol , isosorbide, rosuvastatin  and aspirin 

## 2023-07-14 NOTE — Progress Notes (Signed)
 Subjective:    Patient ID: Daniel Imus., male    DOB: [birth year], 68 y.o.   MRN: [id number]  HPI  Patient presents to clinic today for 46-month follow-up of chronic conditions.  HTN: His BP today is 124/72.  He is taking amlodipine , losartan and metoprolol  as prescribed.  ECG from 03/2020 reviewed.  HLD with CAD/aortic atherosclerosis status post MI: His last LDL was 79, triglycerides 82, 01/2023.  He denies myalgias on rosuvastatin .  He is taking isosorbide, metoprolol  and aspirin  as well.  He tries to consume a low-fat diet.  He follows with cardiology.  DM2: His last A1c was 7.4 %, 01/2023.  He is taking rybelsus , metformin  and glipizide  as prescribed.  His sugars range 97-130.  He checks his feet routinely.  His last eye exam was 10/2022.  Flu 12/2022.  Pneumovax never.  Prevnar 20 12/2020.  COVID Pfizer x 2.  OSA: He averages 7 hours of sleep per night without the use of the CPAP.  He takes unisom as needed to help him sleep.  Sleep study from 09/2017 reviewed.  OA: Mainly in his back and right hip. He does feel like his back pain has been worse in the last 2 weeks.  He takes tylenol  and methocarbamol  as needed with some relief of symptoms.  He does not follow with orthopedics.  ED: He is not currently taking any medications for this.  He does not follow with urology.  Depression: Situational.  He is not currently taking any medications for this.  He is not currently seeing a therapist.  He denies anxiety, SI/HI.  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   Elevated prostate specific antigen (PSA) 02/21/2013   Hemorrhoid    History of kidney stones    Hyperlipidemia    Hypertension    Incomplete bladder emptying 01/10/2013    Malignant neoplasm of prostate (HCC) 04/28/2013   prostate removed   Myocardial infarction (HCC) 2019   2 stents placed   Obstructive sleep apnea on CPAP    does not use cpap, does not snore much since loosing 80 lbs   Prostate cancer (HCC)     Current Outpatient Medications  Medication Sig Dispense Refill   ACCU-CHEK GUIDE TEST test strip TEST BLOOD SUGAR ONE TIME DAILY 100 strip 3   Accu-Chek Softclix Lancets lancets TEST BLOOD SUGAR ONE TIME DAILY 100 each 1   Alcohol Swabs (DROPSAFE ALCOHOL PREP) 70 % PADS USE AS DIRECTED TO CHECK BLOOD SUGAR ONE TIME DAILY 100 each 3   amLODipine  (NORVASC ) 10 MG tablet TAKE 1 TABLET EVERY DAY 90 tablet 0   aspirin  EC 81 MG tablet 81 mg daily. Swallow whole.     blood glucose meter kit and supplies Dispense based on patient and insurance preference. Use to check blood sugar daily as directed. DX Ell.9 1 each 0   Blood Glucose Monitoring Suppl (ACCU-CHEK GUIDE) w/Device KIT Use to check blood sugar daily for type 2 diabetes E11.9 1 kit 0   Cholecalciferol  125 MCG (5000 UT) capsule Take 5,000 Units by mouth daily.     doxylamine, Sleep, (UNISOM) 25 MG tablet Take 25 mg by mouth at bedtime as needed.  glipiZIDE  (GLUCOTROL ) 10 MG tablet TAKE 1 TABLET TWICE DAILY BEFORE MEALS 180 tablet 0   isosorbide mononitrate (IMDUR) 30 MG 24 hr tablet Take 1 tablet (30 mg total) by mouth once daily     losartan (COZAAR) 25 MG tablet Take 1 tablet by mouth daily.     metFORMIN  (GLUCOPHAGE ) 850 MG tablet TAKE 1 TABLET(850 MG) BY MOUTH TWICE DAILY WITH A MEAL 180 tablet 2   methocarbamol  (ROBAXIN ) 500 MG tablet Take 1 tablet (500 mg total) by mouth every 8 (eight) hours as needed. 30 tablet 0   metoprolol  tartrate (LOPRESSOR ) 50 MG tablet Take 1 tablet by mouth 2 (two) times daily.     rosuvastatin  (CRESTOR ) 20 MG tablet TAKE 1 TABLET EVERY DAY 90 tablet 0   Semaglutide  7 MG TABS Take 7 mg by mouth daily.     No current facility-administered medications for this  visit.    No Known Allergies  Family History  Problem Relation Age of Onset   Alcoholism Mother    Alcoholism Father    Pancreatic cancer Brother    Prostate cancer Neg Hx    Kidney disease Neg Hx    Cancer - Colon Neg Hx     Social History   Socioeconomic History   Marital status: Single    Spouse name: Not on file   Number of children: 2   Years of education: Not on file   Highest education level: Not on file  Occupational History   Occupation: WASTE TREATMENT    Employer: [employer]    Comment: retired  Tobacco Use   Smoking status: Never   Smokeless tobacco: Never  Vaping Use   Vaping status: Never Used  Substance and Sexual Activity   Alcohol use: No    Alcohol/week: 0.0 standard drinks of alcohol    Comment: rare   Drug use: No   Sexual activity: Not Currently  Other Topics Concern   Not on file  Social History Narrative   Lives with girlfriend, April   Social Drivers of Health   Financial Resource Strain: High Risk (03/30/2023)   Overall Financial Resource Strain (CARDIA)    Difficulty of Paying Living Expenses: Hard  Food Insecurity: Food Insecurity Present (05/18/2023)   Hunger Vital Sign    Worried About Running Out of Food in the Last Year: Sometimes true    Ran Out of Food in the Last Year: Sometimes true  Transportation Needs: No Transportation Needs (05/18/2023)   PRAPARE - Administrator, Civil Service (Medical): No    Lack of Transportation (Non-Medical): No  Physical Activity: Inactive (05/18/2023)   Exercise Vital Sign    Days of Exercise per Week: 0 days    Minutes of Exercise per Session: 0 min  Stress: Stress Concern Present (05/18/2023)   [employer] of Occupational Health - Occupational Stress Questionnaire    Feeling of Stress : To some extent  Social Connections: Socially Isolated (05/18/2023)   Social Connection and Isolation Panel [NHANES]    Frequency of Communication with Friends and Family: Twice a week     Frequency of Social Gatherings with Friends and Family: Twice a week    Attends Religious Services: Never    Database administrator or Organizations: No    Attends Banker Meetings: Never    Marital Status: Divorced  Catering manager Violence: Not At Risk (05/18/2023)   Humiliation, Afraid, Rape, and Kick questionnaire    Fear of Current or  Ex-Partner: No    Emotionally Abused: No    Physically Abused: No    Sexually Abused: No     Constitutional: Denies fever, malaise, fatigue, headache or abrupt weight changes.  HEENT: Denies eye pain, eye redness, ear pain, ringing in the ears, wax buildup, runny nose, nasal congestion, bloody nose, or sore throat. Respiratory: Denies difficulty breathing, shortness of breath, cough or sputum production.   Cardiovascular: Denies chest pain, chest tightness, palpitations or swelling in the hands or feet.  Gastrointestinal: Denies abdominal pain, bloating, constipation, diarrhea or blood in the stool.  GU: Patient reports erectile dysfunction.  Denies urgency, frequency, pain with urination, burning sensation, blood in urine, odor or discharge. Musculoskeletal: Patient reports back and hip pain.  Denies decrease in range of motion, difficulty with gait, muscle pain or joint swelling.  Skin: Denies redness, rashes, lesions or ulcercations.  Neurological: Pt reports insomnia. Denies dizziness, difficulty with memory, difficulty with speech or problems with balance and coordination.  Psych: Pt has a history of depression. Denies anxiety, SI/HI.  No other specific complaints in a complete review of systems (except as listed in HPI above).  Objective:   Physical Exam  BP 124/72 (BP Location: Left Arm, Patient Position: Sitting, Cuff Size: Normal)   Ht 6\' 1"  (1.854 m)   Wt 269 lb 12.8 oz (122.4 kg)   BMI 35.60 kg/m    Wt Readings from Last 3 Encounters:  01/12/23 281 lb 6.4 oz (127.6 kg)  09/11/22 273 lb (123.8 kg)  07/01/22 273 lb  (123.8 kg)    General: Appears his stated age, obese, in NAD. Skin: Warm, dry and intact. No ulcerations noted. HEENT: Head: normal shape and size; Eyes: sclera white, no icterus, conjunctiva pink, PERRLA and EOMs intact;  Cardiovascular: Normal rate and rhythm. S1,S2 noted.  No murmur, rubs or gallops noted. No JVD or BLE edema. No carotid bruits noted. Pulmonary/Chest: Normal effort and positive vesicular breath sounds. No respiratory distress. No wheezes, rales or ronchi noted.  Musculoskeletal: No bony tenderness noted over the lumbar spine.  Pain with palpation of the right paralumbar muscles.  No difficulty with gait.  Neurological: Alert and oriented. Coordination normal.  Psychiatric: Mood and affect flat.  Behavior is normal. Judgment and thought content normal.     BMET    Component Value Date/Time   NA 138 01/12/2023 0907   NA 139 03/30/2020 1015   NA 139 04/21/2012 0641   K 4.9 01/12/2023 0907   K 3.8 04/21/2012 0641   CL 104 01/12/2023 0907   CL 107 04/21/2012 0641   CO2 26 01/12/2023 0907   CO2 27 04/21/2012 0641   GLUCOSE 146 (H) 01/12/2023 0907   GLUCOSE 212 (H) 04/21/2012 0641   BUN 15 01/12/2023 0907   BUN 14 03/30/2020 1015   BUN 22 (H) 04/21/2012 0641   CREATININE 0.99 01/12/2023 0907   CALCIUM  9.9 01/12/2023 0907   CALCIUM  8.7 04/21/2012 0641   GFRNONAA >60 06/23/2021 2133   GFRNONAA 51 (L) 04/21/2012 0641   GFRAA 100 03/30/2020 1015   GFRAA 59 (L) 04/21/2012 0641    Lipid Panel     Component Value Date/Time   CHOL 142 01/12/2023 0907   CHOL 128 03/30/2020 1015   TRIG 82 01/12/2023 0907   HDL 47 01/12/2023 0907   HDL 41 03/30/2020 1015   CHOLHDL 3.0 01/12/2023 0907   VLDL 14 08/20/2017 0359   LDLCALC 79 01/12/2023 0907    CBC    Component Value  Date/Time   WBC 6.6 01/12/2023 0907   RBC 5.47 01/12/2023 0907   HGB 13.7 01/12/2023 0907   HGB 14.0 03/30/2020 1015   HCT 43.8 01/12/2023 0907   HCT 42.5 03/30/2020 1015   PLT 305 01/12/2023  0907   PLT 306 03/30/2020 1015   MCV 80.1 01/12/2023 0907   MCV 79 03/30/2020 1015   MCV 80 04/21/2012 0641   MCH 25.0 (L) 01/12/2023 0907   MCHC 31.3 (L) 01/12/2023 0907   RDW 14.5 01/12/2023 0907   RDW 14.6 03/30/2020 1015   RDW 14.9 (H) 04/21/2012 0641   LYMPHSABS 1.8 03/30/2020 1015   MONOABS 0.5 04/03/2019 0757   EOSABS 0.3 03/30/2020 1015   BASOSABS 0.1 03/30/2020 1015    Hgb A1C Lab Results  Component Value Date   HGBA1C 7.4 (A) 01/12/2023            Assessment & Plan:    RTC in 6 months for your annual exam Helayne Lo, NP

## 2023-07-14 NOTE — Assessment & Plan Note (Signed)
 C-Met lipid profile today Encouraged him to consume a low-fat diet Continue rosuvastatin 

## 2023-07-14 NOTE — Assessment & Plan Note (Signed)
 Encourage regular stretching and core strengthening Encourage weight loss as this can help reduce back pain Okay to take tylenol  arthritis and methocarbamol  as needed

## 2023-07-14 NOTE — Assessment & Plan Note (Signed)
Encourage weight loss as this can reduce sleep apnea symptoms Not compliant with CPAP

## 2023-07-14 NOTE — Assessment & Plan Note (Addendum)
 C-Met and lipid profile today Encouraged him to consume a low-fat, low-carb diet Continue amlodipine , losartan, metoprolol , isosorbide, rosuvastatin  and aspirin 

## 2023-07-14 NOTE — Assessment & Plan Note (Signed)
Not medicated He does not follow with urology

## 2023-07-14 NOTE — Assessment & Plan Note (Signed)
Doing well off meds Support offered

## 2023-07-14 NOTE — Assessment & Plan Note (Signed)
 C-Met and lipid profile today Encouraged him to consume low-fat diet Continue rosuvastatin  and aspirin 

## 2023-07-14 NOTE — Assessment & Plan Note (Signed)
 Controlled on amlodipine , losartan and metoprolol  C-Met today Reinforced DASH diet and exercise for weight loss

## 2023-07-14 NOTE — Assessment & Plan Note (Signed)
 Encourage diet and exercise for weight loss

## 2023-07-14 NOTE — Assessment & Plan Note (Signed)
 A1c today Urine microalbumin has been checked within the last year Encouraged to consume a low-carb diet and exercise for weight loss Continue rybelsus , metformin  and glipizide  Encourage routine eye exam Encouraged routine foot exam Immunizations UTD

## 2023-07-14 NOTE — Assessment & Plan Note (Signed)
 Encourage weight loss as this can reduce joint pain Okay to take tylenol  and methocarbamol  as needed

## 2023-07-14 NOTE — Assessment & Plan Note (Signed)
In remission s/p prostatectomy He will continue to follow with urology

## 2023-07-14 NOTE — Patient Instructions (Signed)

## 2023-07-15 ENCOUNTER — Ambulatory Visit: Payer: Self-pay | Admitting: Internal Medicine

## 2023-08-18 ENCOUNTER — Other Ambulatory Visit: Payer: Self-pay

## 2023-08-18 NOTE — Patient Outreach (Unsigned)
 Complex Care Management   Visit Note  08/18/2023  Name:  Daniel Bradley. MRN: [id number] DOB: 1956-02-07  Situation: Referral received for Complex Care Management related to COPD, HTN and DM . I obtained verbal consent from Patient.  Visit completed with Daniel Bradley via telephone today.  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   Elevated prostate specific antigen (PSA) 02/21/2013   Hemorrhoid    History of kidney stones    Hyperlipidemia    Hypertension    Incomplete bladder emptying 01/10/2013   Malignant neoplasm of prostate (HCC) 04/28/2013   prostate removed   Myocardial infarction (HCC) 2019   2 stents placed   Obstructive sleep apnea on CPAP    does not use cpap, does not snore much since loosing 80 lbs   Prostate cancer (HCC)     Assessment: Patient Reported Symptoms:  Cognitive Cognitive Status: Alert and oriented to person, place, and time, Normal speech and language skills Cognitive/Intellectual Conditions Management [RPT]: None reported or documented in medical history or problem list Health Maintenance Behaviors: Annual physical exam, Healthy diet, Social activities Healing Pattern: Average Health Facilitated by: Healthy diet, Rest  Neurological Neurological Review of Symptoms: No symptoms reported Neurological Management Strategies: Routine screening Neurological Self-Management Outcome: 4 (good)  HEENT HEENT Symptoms Reported: No symptoms reported HEENT Management Strategies: Routine screening  Cardiovascular Cardiovascular Symptoms Reported: No symptoms reported Does patient have uncontrolled Hypertension?: No Cardiovascular Management Strategies: Routine screening, Medication therapy, Medical device, Diet  modification Cardiovascular Self-Management Outcome: 4 (good)  Respiratory Respiratory Symptoms Reported: No symptoms reported Respiratory Management Strategies: Routine screening Respiratory Self-Management Outcome: 4 (good)  Endocrine Endocrine Symptoms Reported: No symptoms reported Is patient diabetic?: Yes Is patient checking blood sugars at home?: Yes List most recent blood sugar readings, include date and time of day: Reports fasting readings from 113 to 130s. Endocrine Self-Management Outcome: 4 (good)  Gastrointestinal Gastrointestinal Symptoms Reported: No symptoms reported Gastrointestinal Self-Management Outcome: 4 (good)  Genitourinary Genitourinary Symptoms Reported: No symptoms reported Genitourinary Self-Management Outcome: 4 (good)  Integumentary Integumentary Symptoms Reported: No symptoms reported Skin Management Strategies: Routine screening Skin Self-Management Outcome: 4 (good)  Musculoskeletal Musculoskelatal Symptoms Reviewed: No symptoms reported Musculoskeletal Management Strategies: Routine screening, Coping strategies, Medication therapy Musculoskeletal Self-Management Outcome: 4 (good) Falls in the past year?: No Number of falls in past year: 1 or less Was there an injury with Fall?: No Fall Risk Category Calculator: 0 Patient Fall Risk Level: Low Fall Risk Patient at Risk for Falls Due to: Medication side effect Fall risk Follow up: Falls evaluation completed  Psychosocial Psychosocial Symptoms Reported: No symptoms reported Behavioral Management Strategies: Support system Behavioral Health Self-Management Outcome: 4 (good) Major Change/Loss/Stressor/Fears (CP): Denies Quality of Family Relationships: supportive Do you feel physically threatened by others?: No      08/18/2023    7:15 PM  Depression screen PHQ 2/9  Decreased Interest 0  Down, Depressed, Hopeless 0  PHQ - 2 Score 0    Vitals:   08/18/23 1847  BP: 128/79    Medications Reviewed  Today     Reviewed by Karoline Lima, RN (Registered Nurse) on 08/18/23 at 1020  Med List Status: <None>   Medication Order Taking? Sig Documenting Provider Last Dose  Status Informant  ACCU-CHEK GUIDE TEST test strip [id number] No TEST BLOOD SUGAR ONE TIME DAILY Antonette Angeline ORN, NP Taking Active   Accu-Chek Softclix Lancets lancets [id number] No TEST BLOOD SUGAR ONE TIME DAILY Antonette Angeline ORN, NP Taking Active   Alcohol Swabs (DROPSAFE ALCOHOL PREP) 70 % PADS [id number] No USE AS DIRECTED TO CHECK BLOOD SUGAR ONE TIME DAILY Antonette Angeline ORN, NP Taking Active   amLODipine  (NORVASC ) 10 MG tablet [id number] No TAKE 1 TABLET EVERY DAY Antonette Angeline ORN, NP Taking Active   aspirin  EC 81 MG tablet [id number] No 81 mg daily. Swallow whole. [provider] Taking Active   blood glucose meter kit and supplies [id number] No Dispense based on patient and insurance preference. Use to check blood sugar daily as directed. DX Ell.9 Antonette Angeline ORN, NP Taking Active   Blood Glucose Monitoring Suppl (ACCU-CHEK GUIDE) w/Device KIT [id number] No Use to check blood sugar daily for type 2 diabetes E11.9 Antonette Angeline ORN, NP Taking Active   Cholecalciferol  125 MCG (5000 UT) capsule [id number] No Take 5,000 Units by mouth daily. [provider] Taking Active   doxylamine, Sleep, (UNISOM) 25 MG tablet [id number] No Take 25 mg by mouth at bedtime as needed. [provider] Taking Active   glipiZIDE  (GLUCOTROL ) 10 MG tablet [id number] No TAKE 1 TABLET TWICE DAILY BEFORE MEALS Baity, Angeline ORN, NP Taking Active   isosorbide mononitrate (IMDUR) 30 MG 24 hr tablet [id number] No Take 1 tablet (30 mg total) by mouth once daily [provider] Taking Expired 07/16/23 2359   losartan (COZAAR) 25 MG tablet [id number] No Take 1 tablet by mouth daily. [provider] Taking Expired 07/16/23 2359   metFORMIN  (GLUCOPHAGE ) 850 MG tablet [id number] No TAKE 1 TABLET(850 MG) BY MOUTH TWICE DAILY WITH A MEAL  Baity, Angeline ORN, NP Taking Active   methocarbamol  (ROBAXIN ) 500 MG tablet [id number]  Take 1 tablet (500 mg total) by mouth every 8 (eight) hours as needed. Antonette Angeline ORN, NP  Active   metoprolol  tartrate (LOPRESSOR ) 50 MG tablet [id number] No Take 1 tablet by mouth 2 (two) times daily. [provider] Taking Active   rosuvastatin  (CRESTOR ) 20 MG tablet [id number] No TAKE 1 TABLET EVERY DAY Antonette Angeline ORN, NP Taking Active   Semaglutide  7 MG TABS [id number] No Take 7 mg by mouth daily. [provider] Taking Active             Recommendation:   Continue Current Plan of Care  Follow Up Plan:   Telephone follow up appointment with Nurse Case Manager on October 08, 2023    Jackson Acron [facility] RN Care Manager Direct Dial: [phone]  Fax: [phone] Website: [email or web address]

## 2023-08-19 NOTE — Patient Instructions (Signed)
 Thank you for allowing the Complex Care Management team to participate in your care. It was great speaking with you!  Keep up the great work with managing your health!!  We will follow up on October 08, 2023 at 1000. Please do not hesitate to contact me if you require assistance prior to our next outreach.   Jackson Acron [facility] Population Health RN Care Manager Direct Dial: [phone]  Fax: [phone] Website: [email or web address]

## 2023-09-17 ENCOUNTER — Ambulatory Visit: Payer: Medicare HMO

## 2023-09-17 ENCOUNTER — Telehealth: Payer: Self-pay

## 2023-09-17 NOTE — Telephone Encounter (Signed)
 Unsuccessful attempts to reach patient on preferred number listed in notes for scheduled AWV. Left message on voicemail okay to reschedule.

## 2023-09-18 ENCOUNTER — Ambulatory Visit

## 2023-09-18 DIAGNOSIS — Z0000 Encounter for general adult medical examination without abnormal findings: Secondary | ICD-10-CM | POA: Diagnosis not present

## 2023-09-18 NOTE — Progress Notes (Signed)
 Subjective:   Daniel Bradley. is a 68 y.o. who presents for a Medicare Wellness preventive visit.  As a reminder, Annual Wellness Visits don't include a physical exam, and some assessments may be limited, especially if this visit is performed virtually. We may recommend an in-person follow-up visit with your provider if needed.  Visit Complete: Virtual I connected with  Daniel Fultz Jr. on 09/18/23 by a audio enabled telemedicine application and verified that I am speaking with the correct person using two identifiers.  Patient Location: Home  Provider Location: Home Office  I discussed the limitations of evaluation and management by telemedicine. The patient expressed understanding and agreed to proceed.  Vital Signs: Because this visit was a virtual/telehealth visit, some criteria may be missing or patient reported. Any vitals not documented were not able to be obtained and vitals that have been documented are patient reported.  VideoDeclined- This patient declined Librarian, academic. Therefore the visit was completed with audio only.  Persons Participating in Visit: Patient.  AWV Questionnaire: No: Patient Medicare AWV questionnaire was not completed prior to this visit.  Cardiac Risk Factors include: advanced age (>82men, >52 women);diabetes mellitus;hypertension;dyslipidemia;male gender;obesity (BMI >30kg/m2)     Objective:    There were no vitals filed for this visit. There is no height or weight on file to calculate BMI.     09/18/2023    2:50 PM 05/18/2023    7:34 PM 09/11/2022    2:11 PM 10/09/2021    9:49 AM 06/23/2021    9:30 PM 01/10/2021   11:12 AM 05/23/2019    5:00 PM  Advanced Directives  Does Patient Have a Medical Advance Directive? No No No No No No No  Would patient like information on creating a medical advance directive? No - Patient declined No - Patient declined No - Patient declined        Current Medications  (verified) Outpatient Encounter Medications as of 09/18/2023  Medication Sig   ACCU-CHEK GUIDE TEST test strip TEST BLOOD SUGAR ONE TIME DAILY   Accu-Chek Softclix Lancets lancets TEST BLOOD SUGAR ONE TIME DAILY   Alcohol Swabs (DROPSAFE ALCOHOL PREP) 70 % PADS USE AS DIRECTED TO CHECK BLOOD SUGAR ONE TIME DAILY   amLODipine  (NORVASC ) 10 MG tablet TAKE 1 TABLET EVERY DAY   aspirin  EC 81 MG tablet 81 mg daily. Swallow whole.   blood glucose meter kit and supplies Dispense based on patient and insurance preference. Use to check blood sugar daily as directed. DX Ell.9   Blood Glucose Monitoring Suppl (ACCU-CHEK GUIDE) w/Device KIT Use to check blood sugar daily for type 2 diabetes E11.9   Cholecalciferol  125 MCG (5000 UT) capsule Take 5,000 Units by mouth daily.   doxylamine, Sleep, (UNISOM) 25 MG tablet Take 25 mg by mouth at bedtime as needed.   glipiZIDE  (GLUCOTROL ) 10 MG tablet TAKE 1 TABLET TWICE DAILY BEFORE MEALS   isosorbide mononitrate (IMDUR) 30 MG 24 hr tablet Take 1 tablet (30 mg total) by mouth once daily   losartan (COZAAR) 25 MG tablet Take 1 tablet by mouth daily.   metFORMIN  (GLUCOPHAGE ) 850 MG tablet TAKE 1 TABLET(850 MG) BY MOUTH TWICE DAILY WITH A MEAL   methocarbamol  (ROBAXIN ) 500 MG tablet Take 1 tablet (500 mg total) by mouth every 8 (eight) hours as needed.   metoprolol  tartrate (LOPRESSOR ) 50 MG tablet Take 1 tablet by mouth 2 (two) times daily.   rosuvastatin  (CRESTOR ) 20 MG tablet TAKE 1 TABLET EVERY DAY  Semaglutide  7 MG TABS Take 7 mg by mouth daily. RYBELSUS    No facility-administered encounter medications on file as of 09/18/2023.    Allergies (verified) Patient has no known allergies.   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    Elevated prostate specific antigen (PSA) 02/21/2013   Hemorrhoid    History of kidney stones    Hyperlipidemia    Hypertension    Incomplete bladder emptying 01/10/2013   Malignant neoplasm of prostate (HCC) 04/28/2013   prostate removed   Myocardial infarction (HCC) 2019   2 stents placed   Obstructive sleep apnea on CPAP    does not use cpap, does not snore much since loosing 80 lbs   Prostate cancer [facility])    Past Surgical History:  Procedure Laterality Date   COLONOSCOPY     COLONOSCOPY WITH PROPOFOL  N/A 10/09/2021   Procedure: COLONOSCOPY WITH PROPOFOL ;  Surgeon: Unk Corinn Skiff, MD;  Location: [facility] ENDOSCOPY;  Service: Gastroenterology;  Laterality: N/A;   CORONARY STENT INTERVENTION N/A 08/20/2017   Procedure: CORONARY STENT INTERVENTION;  Surgeon: Florencio Cara BIRCH, MD;  Location: [facility] INVASIVE CV LAB;  Service: Cardiovascular;  Laterality: N/A;   dental implants     DG KNEE RIGHT COMPLETE ([facility] HX) Right 05/23/2019   KNEE ARTHROPLASTY Left 05/23/2019   Procedure: COMPUTER ASSISTED TOTAL KNEE ARTHROPLASTY;  Surgeon: Mardee Lynwood SQUIBB, MD;  Location: [facility] ORS;  Service: Orthopedics;  Laterality: Left;   LEFT HEART CATH AND CORONARY ANGIOGRAPHY N/A 08/20/2017   Procedure: LEFT HEART CATH AND CORONARY ANGIOGRAPHY;  Surgeon: Bosie Vinie LABOR, MD;  Location: [facility] INVASIVE CV LAB;  Service: Cardiovascular;  Laterality: N/A;   PROSTATE SURGERY  07/14/2013   Prostatectomy   Family History  Problem Relation Age of Onset   Alcoholism Mother    Alcoholism Father    Pancreatic cancer Brother    Prostate cancer Neg Hx    Kidney disease Neg Hx    Cancer - Colon Neg Hx    Social History   Socioeconomic History   Marital status: Single    Spouse name: Not on file   Number of children: 2   Years of education: Not on file   Highest education level: Not on file  Occupational History   Occupation: [employer]AT[employer]    Employer: [employer]    Comment: retired  Tobacco Use    Smoking status: Never   Smokeless tobacco: Never  Vaping Use   Vaping status: Never Used  Substance and Sexual Activity   Alcohol use: No    Alcohol/week: 0.0 standard drinks of alcohol    Comment: rare   Drug use: No   Sexual activity: Not Currently  Other Topics Concern   Not on file  Social History Narrative   Lives with girlfriend, April   Social Drivers of Health   Financial Resource Strain: Medium Risk (09/18/2023)   Overall Financial Resource Strain (CARDIA)    Difficulty of Paying Living Expenses: Somewhat hard  Food Insecurity: No Food Insecurity (09/18/2023)   Hunger Vital Sign    Worried About Running Out of Food in the Last Year: Never true    Ran Out of Food in the Last Year: Never true  Recent Concern: Food Insecurity -  Food Insecurity Present (08/18/2023)   Hunger Vital Sign    Worried About Running Out of Food in the Last Year: Sometimes true    Ran Out of Food in the Last Year: Sometimes true  Transportation Needs: No Transportation Needs (09/18/2023)   PRAPARE - Administrator, Civil Service (Medical): No    Lack of Transportation (Non-Medical): No  Physical Activity: Insufficiently Active (09/18/2023)   Exercise Vital Sign    Days of Exercise per Week: 3 days    Minutes of Exercise per Session: 30 min  Stress: No Stress Concern Present (09/18/2023)   [employer] of Occupational Health - Occupational Stress Questionnaire    Feeling of Stress: Not at all  Social Connections: Socially Isolated (09/18/2023)   Social Connection and Isolation Panel    Frequency of Communication with Friends and Family: More than three times a week    Frequency of Social Gatherings with Friends and Family: Not on file    Attends Religious Services: Never    Database administrator or Organizations: No    Attends Engineer, structural: Never    Marital Status: Divorced    Tobacco Counseling Counseling given: Not Answered    Clinical Intake:  Pre-visit  preparation completed: Yes  Pain : No/denies pain     BMI - recorded: 35.5 Nutritional Status: BMI > 30  Obese Nutritional Risks: None Diabetes: Yes CBG done?: No Did pt. bring in CBG monitor from home?: No  Lab Results  Component Value Date   HGBA1C 6.9 (H) 07/14/2023   HGBA1C 7.4 (A) 01/12/2023   HGBA1C 6.9 07/01/2022     How often do you need to have someone help you when you read instructions, pamphlets, or other written materials from your doctor or pharmacy?: 1 - Never  Interpreter Needed?: No  Information entered by :: JHONNIE DAS, LPN   Activities of Daily Living    09/18/2023    2:51 PM  In your present state of health, do you have any difficulty performing the following activities:  Hearing? 0  Vision? 0  Difficulty concentrating or making decisions? 1  Comment MEMORY OCCASIONALLY  Walking or climbing stairs? 0  Dressing or bathing? 0  Doing errands, shopping? 0  Preparing Food and eating ? N  Using the Toilet? N  In the past six months, have you accidently leaked urine? N  Do you have problems with loss of bowel control? N  Managing your Medications? N  Managing your Finances? N  Housekeeping or managing your Housekeeping? N    Patient Care Team: Antonette Angeline ORN, NP as PCP - General (Internal Medicine) Alana Sharyle LABOR, RPH-CPP as Pharmacist Karoline Lima, RN as [facility] Management Mevelyn JONETTA Bathe, OD (Optometry)  I have updated your Care Teams any recent Medical Services you may have received from other providers in the past year.     Assessment:   This is a routine wellness examination for [place].  Hearing/Vision screen Hearing Screening - Comments:: NO AIDS Vision Screening - Comments:: WEARS GLASSES ALL DAY- [facility]- HAS APPT IN NOVEMBER   Goals Addressed             This Visit's Progress    DIET - INCREASE WATER INTAKE         Depression Screen     09/18/2023    2:47 PM 08/18/2023    7:15 PM 08/18/2023    6:50 PM  07/14/2023    1:26 PM 05/18/2023  2:49 PM 01/12/2023    9:27 AM 09/11/2022    2:07 PM  PHQ 2/9 Scores  PHQ - 2 Score 0 0 0 2 1 4 2   PHQ- 9 Score 0   8  13 4     Fall Risk     09/18/2023    2:51 PM 08/18/2023    7:14 PM 07/14/2023    1:26 PM 05/18/2023    3:30 PM 01/12/2023    9:28 AM  Fall Risk   Falls in the past year? 0 0 0 0 0  Number falls in past yr: 0 0     Injury with Fall? 0 0  0 0  Risk for fall due to : No Fall Risks Medication side effect   No Fall Risks  Follow up Falls evaluation completed;Falls prevention discussed Falls evaluation completed       MEDICARE RISK AT HOME:  Medicare Risk at Home Any stairs in or around the home?: Yes If so, are there any without handrails?: No Home free of loose throw rugs in walkways, pet beds, electrical cords, etc?: Yes Adequate lighting in your home to reduce risk of falls?: Yes Life alert?: No Use of a cane, walker or w/c?: No Grab bars in the bathroom?: No Shower chair or bench in shower?: No Elevated toilet seat or a handicapped toilet?: No  TIMED UP AND GO:  Was the test performed?  No  Cognitive Function: 6CIT completed        09/18/2023    2:53 PM 09/11/2022    2:17 PM 08/29/2021    1:32 PM  6CIT Screen  What Year? 0 points 0 points 0 points  What month? 0 points 0 points 0 points  What time? 0 points 0 points 0 points  Count back from 20 0 points 0 points 0 points  Months in reverse 2 points 0 points 0 points  Repeat phrase 0 points 2 points 10 points  Total Score 2 points 2 points 10 points    Immunizations Immunization History  Administered Date(s) Administered   Fluad Trivalent(High Dose 65+) 01/12/2023   Influenza,inj,Quad PF,6+ Mos 11/17/2018, 12/30/2019   Influenza-Unspecified 11/25/2011, 12/07/2014, 12/17/2020, 10/25/2021   Moderna Sars-Covid-2 Vaccination 02/16/2020   PFIZER(Purple Top)SARS-COV-2 Vaccination 07/21/2019, 08/11/2019   PNEUMOCOCCAL CONJUGATE-20 12/17/2020   Tdap 02/10/2010    Screening  Tests Health Maintenance  Topic Date Due   Zoster Vaccines- Shingrix (1 of 2) Never done   DTaP/Tdap/Td (2 - Td or Tdap) 02/11/2020   INFLUENZA VACCINE  09/11/2023   OPHTHALMOLOGY EXAM  11/10/2023   Diabetic kidney evaluation - Urine ACR  01/12/2024   FOOT EXAM  01/12/2024   HEMOGLOBIN A1C  01/13/2024   Diabetic kidney evaluation - eGFR measurement  07/13/2024   Medicare Annual Wellness (AWV)  09/17/2024   Colonoscopy  10/10/2031   Pneumococcal Vaccine: 50+ Years  Completed   Hepatitis C Screening  Completed   Hepatitis B Vaccines  Aged Out   HPV VACCINES  Aged Out   Meningococcal B Vaccine  Aged Out   COVID-19 Vaccine  Discontinued    Health Maintenance  Health Maintenance Due  Topic Date Due   Zoster Vaccines- Shingrix (1 of 2) Never done   DTaP/Tdap/Td (2 - Td or Tdap) 02/11/2020   INFLUENZA VACCINE  09/11/2023   Health Maintenance Items Addressed: UP TO DATE ON PNA, NEEDS SHINGRIX & TDAP; UP TO DATE ON COLONOSCOPY  Additional Screening:  Vision Screening: Recommended annual ophthalmology exams for early detection of  glaucoma and other disorders of the eye. Would you like a referral to an eye doctor? No    Dental Screening: Recommended annual dental exams for proper oral hygiene  Community Resource Referral / Chronic Care Management: CRR required this visit?  No   CCM required this visit?  No   Plan:    I have personally reviewed and noted the following in the patient's chart:   Medical and social history Use of alcohol, tobacco or illicit drugs  Current medications and supplements including opioid prescriptions. Patient is not currently taking opioid prescriptions. Functional ability and status Nutritional status Physical activity Advanced directives List of other physicians Hospitalizations, surgeries, and ER visits in previous 12 months Vitals Screenings to include cognitive, depression, and falls Referrals and appointments  In addition, I have  reviewed and discussed with patient certain preventive protocols, quality metrics, and best practice recommendations. A written personalized care plan for preventive services as well as general preventive health recommendations were provided to patient.   Jhonnie GORMAN Das, LPN   02/11/7972   After Visit Summary: (MyChart) Due to this being a telephonic visit, the after visit summary with patients personalized plan was offered to patient via MyChart   Notes: Nothing significant to report at this time.

## 2023-09-18 NOTE — Patient Instructions (Signed)
 Daniel Bradley , Thank you for taking time out of your busy schedule to complete your Annual Wellness Visit with me. I enjoyed our conversation and look forward to speaking with you again next year. I, as well as your care team,  appreciate your ongoing commitment to your health goals. Please review the following plan we discussed and let me know if I can assist you in the future.   Follow up Visits: 09/30/24 @ 4:00 PM BY PHONE We will see or speak with you next year for your Next Medicare AWV with our clinical staff Have you seen your provider in the last 6 months (3 months if uncontrolled diabetes)? Yes  Clinician Recommendations:  Aim for 30 minutes of exercise or brisk walking, 6-8 glasses of water, and 5 servings of fruits and vegetables each day. TAKE CARE!      This is a list of the screenings recommended for you:  Health Maintenance  Topic Date Due   Zoster (Shingles) Vaccine (1 of 2) Never done   DTaP/Tdap/Td vaccine (2 - Td or Tdap) 02/11/2020   Flu Shot  09/11/2023   Eye exam for diabetics  11/10/2023   Yearly kidney health urinalysis for diabetes  01/12/2024   Complete foot exam   01/12/2024   Hemoglobin A1C  01/13/2024   Yearly kidney function blood test for diabetes  07/13/2024   Medicare Annual Wellness Visit  09/17/2024   Colon Cancer Screening  10/10/2031   Pneumococcal Vaccine for age over 24  Completed   Hepatitis C Screening  Completed   Hepatitis B Vaccine  Aged Out   HPV Vaccine  Aged Out   Meningitis B Vaccine  Aged Out   COVID-19 Vaccine  Discontinued    Advanced directives: (ACP Link)Information on Advanced Care Planning can be found at Dispensing optician Advance Health Care Directives Advance Health Care Directives. [email or web address]  Advance Care Planning is important because it:  [x]  Makes sure you receive the medical care that is consistent with your values, goals, and preferences  [x]  It provides guidance to your family and loved ones and reduces  their decisional burden about whether or not they are making the right decisions based on your wishes.  Follow the link provided in your after visit summary or read over the paperwork we have mailed to you to help you started getting your Advance Directives in place. If you need assistance in completing these, please reach out to us  so that we can help you!

## 2023-09-25 ENCOUNTER — Telehealth: Payer: Self-pay | Admitting: Pharmacist

## 2023-09-25 ENCOUNTER — Other Ambulatory Visit: Admitting: Pharmacist

## 2023-09-25 DIAGNOSIS — Z7984 Long term (current) use of oral hypoglycemic drugs: Secondary | ICD-10-CM

## 2023-09-25 DIAGNOSIS — I10 Essential (primary) hypertension: Secondary | ICD-10-CM

## 2023-09-25 DIAGNOSIS — E1169 Type 2 diabetes mellitus with other specified complication: Secondary | ICD-10-CM

## 2023-09-25 MED ORDER — ROSUVASTATIN CALCIUM 20 MG PO TABS: 20.0000 mg | ORAL_TABLET | Freq: Every day | ORAL | 1 refills | Status: AC

## 2023-09-25 MED ORDER — GLIPIZIDE 10 MG PO TABS: 10.0000 mg | ORAL_TABLET | Freq: Two times a day (BID) | ORAL | 1 refills | Status: AC

## 2023-09-25 MED ORDER — AMLODIPINE BESYLATE 10 MG PO TABS: 10.0000 mg | ORAL_TABLET | Freq: Every day | ORAL | 1 refills | Status: AC

## 2023-09-25 NOTE — Progress Notes (Signed)
 09/25/2023 Name: Daniel Bradley. MRN: [id number] DOB: 03-May-1955  Chief Complaint  Patient presents with   Medication Management   Medication Assistance   Medication Adherence    Daniel Ambrocio Jr. is a 68 y.o. year old male who presented for a telephone visit.   They were referred to the pharmacist by their PCP for assistance in managing diabetes and hypertension.      Subjective:   Care Team: Primary Care Provider: Antonette Angeline ORN, NP ; Next Scheduled Visit: 01/14/2024 Cardiologist: Florencio Cara Endow, MD; Next Scheduled Visit: 02/17/2024 Urologist: Penne Knee, MD Nurse Care Manager: Karoline Lima, RN; Next Scheduled Visit: 10/08/2023    Medication Access/Adherence  Current Pharmacy:  [facility] - [place], [place] - [address] [place] [address] Phone: [phone] Fax: [phone]  [facility] [phone] [place], [place] - [address] AT [facility] [address] [place] [address] Phone: [phone] Fax: [phone]   Patient reports affordability concerns with their medications: No  Patient reports access/transportation concerns to their pharmacy: No  Patient reports adherence concerns with their medications:  No       Reports takes his medications directly from pill bottles and has a routine to aid with adherence     Diabetes:   Current medications:  Glipizide  10 mg twice daily before breakfast and supper (30 minutes prior to meal) Metformin  850 mg twice daily Rybelsus  7 mg daily  Confirms taking on an empty stomach, >=30 minutes before the first food, beverage, or other oral medications of the day with <=4 oz of plain water only   Medications tried in the past: Rybelsus  (cost); Ozempic  (cost); Tresiba  (cost)    Reports recent morning fasting readings ranging 110-148; today: 110   Denies symptoms of hypoglycemia     Statin therapy: rosuvastatin  20 mg daily    Current Physical Activity: Reports has not been to the gym in ~2 weeks; planning to get back to walking on treadmill for 30 minutes and then weight machines for 30 minutes x 3-4 days/week   Current medication access support: enrolled in patient assistance for Rybelsus  from [employer] through 02/10/2024 - Reports recently received refill of Rybelsus  from assistance program   Hypertension:   Current medications:  amlodipine  10 mg daily metoprolol  50 mg twice daily Losartan 25 mg daily Isosorbide ER 30 mg daily    Patient has a validated, automated, upper arm home BP cuff Reports recently blood pressure Today: 129/71, HR 76   Attributes elevated readings to eating saltier food (fast)   Patient denies hypotensive s/sx including dizziness, lightheadedness.    Current Physical Activity: Reports has not been to the gym in ~2 weeks; planning to get back to walking on treadmill for 30 minutes and then weight machines for 30 minutes x 3-4 days/week    Objective:  Lab Results  Component Value Date   HGBA1C 6.9 (H) 07/14/2023    Lab Results  Component Value Date   CREATININE 0.89 07/14/2023   BUN 17 07/14/2023   NA 138 07/14/2023   K 4.4 07/14/2023   CL 106 07/14/2023   CO2 23 07/14/2023    Lab Results  Component Value Date   CHOL 128 07/14/2023   HDL 51 07/14/2023   LDLCALC 61 07/14/2023   TRIG 84 07/14/2023   CHOLHDL 2.5 07/14/2023    Medications Reviewed Today     Reviewed by Alana Fend  A, RPH-CPP (Pharmacist) on 09/25/23 at 1238  Med List Status: <None>   Medication Order Taking? Sig Documenting Provider Last Dose Status Informant  ACCU-CHEK GUIDE TEST test strip [id number]  TEST BLOOD SUGAR ONE TIME DAILY Antonette Angeline ORN, NP  Active   Accu-Chek Softclix Lancets lancets [id number]  TEST BLOOD SUGAR ONE TIME DAILY Antonette Angeline ORN, NP  Active   Alcohol Swabs (DROPSAFE ALCOHOL PREP) 70 % PADS [id number]  USE AS DIRECTED TO CHECK BLOOD SUGAR ONE TIME DAILY Antonette Angeline ORN, NP  Active   amLODipine  (NORVASC ) 10 MG tablet [id number]  Take 1 tablet (10 mg total) by mouth daily. Antonette Angeline ORN, NP  Active   aspirin  EC 81 MG tablet [id number]  81 mg daily. Swallow whole. [provider]  Active   blood glucose meter kit and supplies [id number]  Dispense based on patient and insurance preference. Use to check blood sugar daily as directed. DX Ell.9 Antonette Angeline ORN, NP  Active   Blood Glucose Monitoring Suppl (ACCU-CHEK GUIDE) w/Device KIT [id number]  Use to check blood sugar daily for type 2 diabetes E11.9 Antonette Angeline ORN, NP  Active   Cholecalciferol  125 MCG (5000 UT) capsule [id number]  Take 5,000 Units by mouth daily. [provider]  Active   doxylamine, Sleep, (UNISOM) 25 MG tablet [id number]  Take 25 mg by mouth at bedtime as needed. [provider]  Active   glipiZIDE  (GLUCOTROL ) 10 MG tablet [id number]  Take 1 tablet (10 mg total) by mouth 2 (two) times daily before a meal. Baity, Angeline ORN, NP  Active   isosorbide mononitrate (IMDUR) 30 MG 24 hr tablet [id number] Yes Take 1 tablet (30 mg total) by mouth once daily [provider]  Active   losartan (COZAAR) 25 MG tablet [id number] Yes Take 1 tablet by mouth daily. [provider]  Active   metFORMIN  (GLUCOPHAGE ) 850 MG tablet [id number] Yes TAKE 1 TABLET(850 MG) BY MOUTH TWICE DAILY WITH A MEAL Baity, Angeline ORN, NP  Active   methocarbamol  (ROBAXIN ) 500 MG tablet [id number]  Take 1 tablet (500 mg total) by mouth every 8 (eight) hours as needed. Antonette Angeline ORN, NP  Active   metoprolol  tartrate (LOPRESSOR ) 50 MG tablet [id number] Yes Take 1 tablet by mouth 2 (two) times daily. [provider]  Active   rosuvastatin  (CRESTOR ) 20 MG tablet [id number]  Take 1 tablet (20 mg total) by mouth daily. Antonette Angeline ORN, NP  Active   Semaglutide  7 MG TABS [id number] Yes Take 7 mg by mouth daily. RYBELSUS  [provider]  Active               Assessment/Plan:    Recommend patient restart using weekly pillbox as adherence aid, but patient prefers to using his current strategy of moving his pill bottles to aid with adherence. Recommend using medication list also to aid with adherence   Identify patient in need of refills of his amlodipine , glipizide  and rosuvastatin  and current prescriptions are out of refills  Will collaborate with PCP to request provider send renewals to pharmacy for patient  Patient to contact pharmacy for refill of isosorbide prescription   Diabetes: - Currently controlled - Have reviewed long term cardiovascular and renal outcomes of uncontrolled blood sugar - Have reviewed goal A1c, goal fasting, and goal 2 hour post prandial glucose - Encouraged patient to have regular well-balance meals throughout the day (including breakfast), while controlling carbohydrate portion sizes - Have counseled on s/s of low blood  sugar and how to treat lows Patient carries glucose tablets with him in case needed for hypoglycemia - Recommend to check glucose, keep a log of the results, have this record to review during future appointments, but to contact office sooner if needed for readings outside of established parameters or symptoms  - Patient to follow up with [employer] patient assistance program as needed for refills of Rybelsus      Hypertension: - Currently controlled - Counsel on impact of salt/sodium on blood pressure. Encourage patient to limit intake of salt and fast food - Recommended to check home blood pressure and heart rate using upper arm monitor, keep a log of the results and have this record to review during future appointments        Follow Up Plan: Clinical Pharmacist will follow up with patient by telephone on 11/20/2023 at 11:30 AM       Sharyle Sia, PharmD, [facility] Clinical Pharmacist [facility] [phone]

## 2023-09-25 NOTE — Telephone Encounter (Signed)
Medication sent to pharmacy per request 

## 2023-09-25 NOTE — Progress Notes (Signed)
 Patient out of refills of his amlodipine , glipizide  and rosuvastatin  and latest prescriptions out of refills.  Would you please consider sending refills to [facility] in [place]?  Thank you!  Sharyle

## 2023-09-25 NOTE — Patient Instructions (Signed)
 Goals Addressed             This Visit's Progress    Pharmacy Goals       If you need to reach out to patient assistance programs regarding refills or to find out the status of your application, you can do so by calling:  [employer] at [phone]  Our goal A1c is less than 7%. This corresponds with fasting sugars less than 130 and 2 hour after meal sugars less than 180. Please keep a log of your results when checking your blood sugar   Our goal bad cholesterol, or LDL, is less than 70 . This is why it is important to continue taking your rosuvastatin.  Check your blood pressure twice weekly, and any time you have concerning symptoms like headache, chest pain, dizziness, shortness of breath, or vision changes.   Our goal is less than 130/80.  To appropriately check your blood pressure, make sure you do the following:  1) Avoid caffeine, exercise, or tobacco products for 30 minutes before checking. Empty your bladder. 2) Sit with your back supported in a flat-backed chair. Rest your arm on something flat (arm of the chair, table, etc). 3) Sit still with your feet flat on the floor, resting, for at least 5 minutes.  4) Check your blood pressure. Take 1-2 readings.  5) Write down these readings and bring with you to any provider appointments.  Bring your home blood pressure machine with you to a provider's office for accuracy comparison at least once a year.   Make sure you take your blood pressure medications before you come to any office visit, even if you were asked to fast for labs.  Estelle Grumbles, PharmD, [facility] Clinical Pharmacist [facility] [phone]

## 2023-09-25 NOTE — Addendum Note (Signed)
 Addended by: ANTONETTE ANGELINE ORN on: 09/25/2023 12:34 PM   Modules accepted: Orders

## 2023-10-08 ENCOUNTER — Other Ambulatory Visit: Payer: Self-pay

## 2023-10-08 NOTE — Patient Outreach (Signed)
 Complex Care Management   Visit Note  10/08/2023  Name:  Daniel Bradley. MRN: [id number] DOB: [birth year]  Situation: Referral received for Complex Care Management related to Hypertension,  Hyperlipidemia and Diabetes. I obtained verbal consent from Patient.  Visit completed with Daniel Bradley via telephone.  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   Elevated prostate specific antigen (PSA) 02/21/2013   Hemorrhoid    History of kidney stones    Hyperlipidemia    Hypertension    Incomplete bladder emptying 01/10/2013   Malignant neoplasm of prostate (HCC) 04/28/2013   prostate removed   Myocardial infarction (HCC) 2019   2 stents placed   Obstructive sleep apnea on CPAP    does not use cpap, does not snore much since loosing 80 lbs   Prostate cancer (HCC)     Assessment: Patient Reported Symptoms: Cognitive Cognitive Status: Alert and oriented to person, place, and time, Normal speech and language skills Cognitive/Intellectual Conditions Management [RPT]: None reported or documented in medical history or problem list Health Maintenance Behaviors: Annual physical exam, Healthy diet, Social activities Healing Pattern: Average Health Facilitated by: Rest, Healthy diet  Neurological Neurological Review of Symptoms: No symptoms reported Neurological Management Strategies: Routine screening Neurological Self-Management Outcome: 4 (good)  HEENT HEENT Symptoms Reported: No symptoms reported HEENT Management Strategies: Routine screening HEENT Self-Management Outcome: 4 (good)  Cardiovascular Cardiovascular Symptoms Reported: No symptoms reported Does patient have uncontrolled Hypertension?: No Cardiovascular Management Strategies: Medical device,  Medication therapy, Routine screening, Diet modification, Coping strategies, Exercise Cardiovascular Self-Management Outcome: 4 (good)  Respiratory Respiratory Symptoms Reported: No symptoms reported Respiratory Management Strategies: Routine screening Respiratory Self-Management Outcome: 4 (good)  Endocrine Endocrine Symptoms Reported: No symptoms reported Is patient diabetic?: Yes Is patient checking blood sugars at home?: Yes List most recent blood sugar readings, include date and time of day: Reports not having log at time of call. Reports blood sugar readings have been within range. Endocrine Self-Management Outcome: 4 (good)  Gastrointestinal Gastrointestinal Symptoms Reported: No symptoms reported Gastrointestinal Self-Management Outcome: 4 (good)  Genitourinary Genitourinary Symptoms Reported: No symptoms reported Genitourinary Self-Management Outcome: 4 (good)  Integumentary Integumentary Symptoms Reported: No symptoms reported Skin Management Strategies: Routine screening Skin Self-Management Outcome: 4 (good)  Musculoskeletal Musculoskelatal Symptoms Reviewed: No symptoms reported Musculoskeletal Management Strategies: Routine screening, Coping strategies, Medication therapy, Adequate rest Musculoskeletal Self-Management Outcome: 4 (good)  Psychosocial Psychosocial Symptoms Reported: No symptoms reported Quality of Family Relationships: supportive Do you feel physically threatened by others?: No   There were no vitals filed for this visit.  Medications Reviewed Today     Reviewed by Karoline Lima, RN (Registered Nurse) on 10/08/23 at 1149  Med List Status: <None>   Medication Order Taking? Sig Documenting Provider Last Dose Status Informant  ACCU-CHEK GUIDE TEST test strip [id number]  TEST BLOOD SUGAR ONE TIME DAILY Antonette Angeline ORN, NP  Active   Accu-Chek Softclix Lancets lancets [id number]  TEST BLOOD SUGAR ONE TIME DAILY Antonette Angeline ORN, NP  Active   Alcohol Swabs  (DROPSAFE ALCOHOL PREP) 70 % PADS [id number]  USE AS DIRECTED TO CHECK BLOOD SUGAR ONE TIME DAILY Antonette Angeline ORN, NP  Active   amLODipine  (NORVASC ) 10 MG tablet [id number]  Take 1 tablet (10  mg total) by mouth daily. Antonette Angeline ORN, NP  Active   aspirin  EC 81 MG tablet [id number]  81 mg daily. Swallow whole. [provider]  Active   blood glucose meter kit and supplies [id number]  Dispense based on patient and insurance preference. Use to check blood sugar daily as directed. DX Ell.9 Antonette Angeline ORN, NP  Active   Blood Glucose Monitoring Suppl (ACCU-CHEK GUIDE) w/Device KIT [id number]  Use to check blood sugar daily for type 2 diabetes E11.9 Antonette Angeline ORN, NP  Active   Cholecalciferol  125 MCG (5000 UT) capsule [id number]  Take 5,000 Units by mouth daily. [provider]  Active   doxylamine, Sleep, (UNISOM) 25 MG tablet [id number]  Take 25 mg by mouth at bedtime as needed. [provider]  Active   glipiZIDE  (GLUCOTROL ) 10 MG tablet [id number]  Take 1 tablet (10 mg total) by mouth 2 (two) times daily before a meal. Baity, Angeline ORN, NP  Active   isosorbide mononitrate (IMDUR) 30 MG 24 hr tablet [id number]  Take 1 tablet (30 mg total) by mouth once daily [provider]  Expired 09/25/23 2359   losartan (COZAAR) 25 MG tablet [id number]  Take 1 tablet by mouth daily. [provider]  Expired 09/25/23 2359   metFORMIN  (GLUCOPHAGE ) 850 MG tablet [id number]  TAKE 1 TABLET(850 MG) BY MOUTH TWICE DAILY WITH A MEAL Baity, Angeline ORN, NP  Active   methocarbamol  (ROBAXIN ) 500 MG tablet [id number]  Take 1 tablet (500 mg total) by mouth every 8 (eight) hours as needed. Antonette Angeline ORN, NP  Active   metoprolol  tartrate (LOPRESSOR ) 50 MG tablet [id number]  Take 1 tablet by mouth 2 (two) times daily. [provider]  Expired 09/25/23 2359   rosuvastatin  (CRESTOR ) 20 MG tablet [id number]  Take 1 tablet (20 mg total) by mouth daily. Antonette Angeline ORN, NP  Active   Semaglutide  7  MG TABS [id number]  Take 7 mg by mouth daily. RYBELSUS  [provider]  Active             Recommendation:   Continue Current Plan of Care  Follow Up Plan:   Telephone follow up appointment with Nurse Case Manager on November 06, 2023   Jackson Acron [facility] RN Care Manager Direct Dial: [phone]  Fax: [phone] Website: [email or web address]

## 2023-10-08 NOTE — Patient Instructions (Signed)
 Thank you for allowing the Complex Care Management team to participate in your care. It was great speaking with you!  We will follow up on November 06, 2023 at 1000. Please do not hesitate to contact me if you require assistance prior to our next outreach.   Jackson Acron [facility] Population [facility] RN Care Manager Direct Dial: [phone]  Fax: [phone] Website: [email or web address]

## 2023-11-05 DIAGNOSIS — E119 Type 2 diabetes mellitus without complications: Secondary | ICD-10-CM | POA: Diagnosis not present

## 2023-11-05 DIAGNOSIS — H1045 Other chronic allergic conjunctivitis: Secondary | ICD-10-CM | POA: Diagnosis not present

## 2023-11-05 DIAGNOSIS — H2513 Age-related nuclear cataract, bilateral: Secondary | ICD-10-CM | POA: Diagnosis not present

## 2023-11-05 DIAGNOSIS — H40013 Open angle with borderline findings, low risk, bilateral: Secondary | ICD-10-CM | POA: Diagnosis not present

## 2023-11-05 LAB — HM DIABETES EYE EXAM

## 2023-11-06 ENCOUNTER — Other Ambulatory Visit: Payer: Self-pay

## 2023-11-09 NOTE — Patient Outreach (Signed)
 Complex Care Management   Visit Note    Name:  Daniel Bradley. MRN: [id number] DOB: November 15, 1955  Situation: Referral received for Complex Care Management related to Diabetes, Hypertension and Hyperlipidemia I obtained verbal consent from Patient.  Visit completed with Daniel Bradley via telephone.  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   Elevated prostate specific antigen (PSA) 02/21/2013   Hemorrhoid    History of kidney stones    Hyperlipidemia    Hypertension    Incomplete bladder emptying 01/10/2013   Malignant neoplasm of prostate (HCC) 04/28/2013   prostate removed   Myocardial infarction (HCC) 2019   2 stents placed   Obstructive sleep apnea on CPAP    does not use cpap, does not snore much since loosing 80 lbs   Prostate cancer (HCC)     Assessment: Patient Reported Symptoms: Cognitive Cognitive Status: Alert and oriented to person, place, and time, Normal speech and language skills Cognitive/Intellectual Conditions Management [RPT]: None reported or documented in medical history or problem list Health Maintenance Behaviors: Annual physical exam, Healthy diet, Social activities, Hobbies, Exercise Healing Pattern: Average Health Facilitated by: Healthy diet, Rest  Neurological Neurological Review of Symptoms: No symptoms reported Neurological Self-Management Outcome: 4 (good)  HEENT HEENT Symptoms Reported: No symptoms reported HEENT Self-Management Outcome: 4 (good)  Cardiovascular Cardiovascular Symptoms Reported: No symptoms reported Does patient have uncontrolled Hypertension?: No  Respiratory Respiratory Symptoms Reported: No symptoms reported Respiratory Self-Management Outcome: 4 (good)  Endocrine Endocrine Symptoms  Reported: No symptoms reported Is patient diabetic?: Yes Is patient checking blood sugars at home?: Yes List most recent blood sugar readings, include date and time of day: Reports home readings have been within range. A1C within goal Endocrine Self-Management Outcome: 4 (good)  Gastrointestinal Gastrointestinal Symptoms Reported: No symptoms reported Gastrointestinal Self-Management Outcome: 4 (good)  Genitourinary Genitourinary Symptoms Reported: No symptoms reported Genitourinary Self-Management Outcome: 4 (good)  Integumentary Integumentary Symptoms Reported: No symptoms reported Skin Self-Management Outcome: 4 (good)  Musculoskeletal Musculoskelatal Symptoms Reviewed: No symptoms reported Musculoskeletal Self-Management Outcome: 4 (good)  Psychosocial Psychosocial Symptoms Reported: No symptoms reported Quality of Family Relationships: supportive Do you feel physically threatened by others?: No    11/09/2023    PHQ2-9 Depression Screening   Little interest or pleasure in doing things Not at all  Feeling down, depressed, or hopeless Not at all  PHQ-2 - Total Score 0    There were no vitals filed for this visit.  Medications Reviewed Today     Reviewed by Karoline Lima, RN (Registered Nurse) on 11/06/23 at 1032  Med List Status: <None>   Medication Order Taking? Sig Documenting Provider Last Dose Status Informant  ACCU-CHEK GUIDE TEST test strip [id number]  TEST BLOOD SUGAR ONE TIME DAILY Antonette Angeline ORN, NP  Active   Accu-Chek Softclix Lancets lancets [id number]  TEST BLOOD SUGAR ONE TIME DAILY Antonette Angeline ORN, NP  Active   Alcohol Swabs (DROPSAFE ALCOHOL PREP) 70 % PADS [id number]  USE AS DIRECTED TO CHECK BLOOD SUGAR ONE TIME DAILY Antonette Angeline ORN, NP  Active   amLODipine  (NORVASC ) 10 MG tablet [id number]  Take 1 tablet (10 mg total) by mouth daily. Antonette Angeline ORN, NP  Active   aspirin  EC 81 MG tablet  [id number]  81 mg daily. Swallow whole. [provider]  Active    blood glucose meter kit and supplies [id number]  Dispense based on patient and insurance preference. Use to check blood sugar daily as directed. DX Ell.9 Antonette Angeline ORN, NP  Active   Blood Glucose Monitoring Suppl (ACCU-CHEK GUIDE) w/Device KIT [id number]  Use to check blood sugar daily for type 2 diabetes E11.9 Antonette Angeline ORN, NP  Active   Cholecalciferol  125 MCG (5000 UT) capsule [id number]  Take 5,000 Units by mouth daily. [provider]  Active   doxylamine, Sleep, (UNISOM) 25 MG tablet [id number]  Take 25 mg by mouth at bedtime as needed. [provider]  Active   glipiZIDE  (GLUCOTROL ) 10 MG tablet [id number]  Take 1 tablet (10 mg total) by mouth 2 (two) times daily before a meal. Baity, Angeline ORN, NP  Active   isosorbide mononitrate (IMDUR) 30 MG 24 hr tablet [id number]  Take 1 tablet (30 mg total) by mouth once daily [provider]  Expired 09/25/23 2359   losartan (COZAAR) 25 MG tablet [id number]  Take 1 tablet by mouth daily. [provider]  Expired 09/25/23 2359   metFORMIN  (GLUCOPHAGE ) 850 MG tablet [id number]  TAKE 1 TABLET(850 MG) BY MOUTH TWICE DAILY WITH A MEAL Baity, Angeline ORN, NP  Active   methocarbamol  (ROBAXIN ) 500 MG tablet [id number]  Take 1 tablet (500 mg total) by mouth every 8 (eight) hours as needed. Antonette Angeline ORN, NP  Active   metoprolol  tartrate (LOPRESSOR ) 50 MG tablet [id number]  Take 1 tablet by mouth 2 (two) times daily. [provider]  Expired 09/25/23 2359   rosuvastatin  (CRESTOR ) 20 MG tablet [id number]  Take 1 tablet (20 mg total) by mouth daily. Antonette Angeline ORN, NP  Active   Semaglutide  7 MG TABS [id number]  Take 7 mg by mouth daily. RYBELSUS  [provider]  Active             Recommendation:   Continue Current Plan of Care  Follow Up Plan:   Patient has met all care management goals. Care Management case will be closed. Patient has been provided contact information should new needs arise.    Jackson Acron [facility] Population Health RN Care Manager Direct Dial: [phone]  Fax: [phone] Website: [email or web address]

## 2023-11-09 NOTE — Patient Instructions (Signed)
 Thank you for allowing the Complex Care Management team to participate in your care. It was great speaking with you.  Congratulations on meeting your care management goal. You are doing an amazing job improving your health and working towards your [employer] goals. Keep up the great work managing your health!  Please do not hesitate to contact your primary care provider if you health needs change and you require assistance. Our care team will gladly assist.    Jackson Acron [facility] RN Care Manager Direct Dial: [phone]  Fax: [phone] Website: [email or web address]   ]

## 2023-11-10 ENCOUNTER — Telehealth: Payer: Self-pay

## 2023-11-10 ENCOUNTER — Other Ambulatory Visit: Payer: Self-pay | Admitting: Pharmacist

## 2023-11-10 ENCOUNTER — Encounter: Payer: Self-pay | Admitting: Pharmacist

## 2023-11-10 DIAGNOSIS — E119 Type 2 diabetes mellitus without complications: Secondary | ICD-10-CM

## 2023-11-10 DIAGNOSIS — E1169 Type 2 diabetes mellitus with other specified complication: Secondary | ICD-10-CM

## 2023-11-10 NOTE — Progress Notes (Signed)
   11/10/2023  Patient ID: Daniel Bradley., male   DOB: Apr 30, 1955, 67 y.o.   MRN: [id number]  Patient enrolled in Rybelsus  patient assistance program from [employer] through 02/10/2024  [employer] has announced that they will no longer offer Rybelsus  to [employer] beneficiaries through their Patient Assistance Program in 2026.   Outreach to patient today and provide this update.   Based on reported income, patient does not meet criteria for Extra Help subsidy  Counsel patient that Medicare's Annual Enrollment Period for 2026 starts 11/25/2023. Encourage patient to review deductibles formulary coverage and copay amounts (including for Rybelsus ) between plans. Also share with patient the contact information for the [facility] Information Program [facility]) that can help with reviewing these Medicare plans As requested will share this information with patient via MyChart message  Will collaborate with CPhT as patient requesting refill of Rybelsus .  Sharyle Sia, PharmD, [facility] Clinical Pharmacist [facility] [phone]

## 2023-11-10 NOTE — Patient Instructions (Signed)
 [employer], the maker of Ozempic  and Rybelsus , has announced that they will no longer offer these medications to Medicare beneficiaries through their Patient Assistance Program in 2026.    This means that if you currently receive Ozempic  or Rybelsus  at no cost through the program, starting in January 2026 you'll need to use your insurance to continue on these medicines.    Choosing a Medicare Plan   With Medicare's Annual Enrollment Period starting on October 15th, we encourage you to review formulary coverage and copay amounts for Ozempic  or Rybelsus , as costs can vary widely between plans. Starting on Wednesday, October 1st you can compare your Medicare plan options by going to the Plan Finder tool at [employer].gov ( TeleconferenceOnDemand.fr ).    There are Medicare Specialists through the [facility] Insurance Information Program ([place]) that can help you shop for Medicare plans.    [place] [facility] toll free number: [phone] (Monday - Friday 8 am - 5 pm)   There are representatives located in each county:    [facility] [address]     [place]  [address] [phone] Call for an appointment with [facility]        Maximum Out-of-Pocket and Prescription Payment Plan   In 2026, the maximum yearly out-of-pocket cost for medications is $2,100 for all Medicare beneficiaries. This means you will not have to pay more than $2,100 in total for all prescription medications filled on your Medicare plan in 2026. You may also choose to enroll in a Medicare Prescription Payment Plan through your chosen 2026 Medicare plan. This lets you spread out your prescription drug costs over the year instead of laying a large amount all at once at the pharmacy.      Sharyle Sia, PharmD, [facility] Clinical Pharmacist [facility] [phone]

## 2023-11-10 NOTE — Telephone Encounter (Signed)
 Completed refill form for Rybelsus  [employer]) and faxed to provider's office for review and signature.

## 2023-11-17 NOTE — Progress Notes (Signed)
 Daniel Bradley.                                          MRN: [id number]   11/17/2023   The VBCI Quality Team Specialist reviewed this patient medical record for the purposes of chart review for care gap closure. The following were reviewed: chart review for care gap closure-kidney health evaluation for diabetes:eGFR  and uACR.    VBCI Quality Team

## 2023-11-20 ENCOUNTER — Other Ambulatory Visit: Admitting: Pharmacist

## 2023-11-20 DIAGNOSIS — I10 Essential (primary) hypertension: Secondary | ICD-10-CM

## 2023-11-20 DIAGNOSIS — Z7984 Long term (current) use of oral hypoglycemic drugs: Secondary | ICD-10-CM

## 2023-11-20 DIAGNOSIS — E1169 Type 2 diabetes mellitus with other specified complication: Secondary | ICD-10-CM

## 2023-11-20 NOTE — Patient Instructions (Signed)
Goals Addressed             This Visit's Progress    Pharmacy Goals       Our goal A1c is less than 7%. This corresponds with fasting sugars less than 130 and 2 hour after meal sugars less than 180. Please keep a log of your results when checking your blood sugar   Our goal bad cholesterol, or LDL, is less than 70 . This is why it is important to continue taking your rosuvastatin.  Check your blood pressure twice weekly, and any time you have concerning symptoms like headache, chest pain, dizziness, shortness of breath, or vision changes.   Our goal is less than 130/80.  To appropriately check your blood pressure, make sure you do the following:  1) Avoid caffeine, exercise, or tobacco products for 30 minutes before checking. Empty your bladder. 2) Sit with your back supported in a flat-backed chair. Rest your arm on something flat (arm of the chair, table, etc). 3) Sit still with your feet flat on the floor, resting, for at least 5 minutes.  4) Check your blood pressure. Take 1-2 readings.  5) Write down these readings and bring with you to any provider appointments.  Bring your home blood pressure machine with you to a provider's office for accuracy comparison at least once a year.   Make sure you take your blood pressure medications before you come to any office visit, even if you were asked to fast for labs.   Estelle Grumbles, PharmD, [facility] Clinical Pharmacist [facility] [phone]

## 2023-11-20 NOTE — Progress Notes (Signed)
 11/20/2023 Name: Daniel Mcmullen Jr. MRN: [id number] DOB: [birth year]  Chief Complaint  Patient presents with   Medication Management   Medication Assistance    Daniel Bradley. is a 68 y.o. year old male who presented for a telephone visit.   They were referred to the pharmacist by their PCP for assistance in managing diabetes and hypertension.      Subjective:   Care Team: Primary Care Provider: Antonette Angeline ORN, NP ; Next Scheduled Visit: 01/14/2024 Cardiologist: Florencio Cara Endow, MD; Next Scheduled Visit: 02/17/2024 Urologist: Penne Knee, MD Nurse Care Manager: Karoline Lima, RN    Medication Access/Adherence  Current Pharmacy:  [facility] - [place], [place] - [address] [place] [address] Phone: [phone] Fax: [phone]  [facility] [phone] [place], [place] - [address] AT [facility] [address] [place] [address] Phone: [phone] Fax: [phone]   Patient reports affordability concerns with their medications: No  Patient reports access/transportation concerns to their pharmacy: No  Patient reports adherence concerns with their medications:  No       Reports takes his medications directly from pill bottles and has a routine to aid with adherence     Diabetes:   Current medications:  Glipizide  10 mg twice daily before breakfast and supper (30 minutes prior to meal) Metformin  850 mg twice daily Rybelsus  7 mg daily  Confirms taking on an empty stomach, >=30 minutes before the first food, beverage, or other oral medications of the day with <=4 oz of plain water only   Medications tried in the past: Rybelsus  (cost); Ozempic  (cost); Tresiba  (cost)    Reports last checked morning fasting blood sugar: 9/23, reading: 128   Denies recent symptoms of hypoglycemia, but admits felt shaky with reading of 72 last month - Carries glucose tablets - Attributes low  blood sugar reading to skipping breakfast, but still taking glipizide  dose, that morning      Statin therapy: rosuvastatin  20 mg daily   Current Physical Activity: Going to gym 2-3 days/week. Reports walking on treadmill for 30 minutes and then weight machines for 30 minutes x 3-4 days/week  Reports when last checked, home weight was down to 256 lbs   Current medication access support: enrolled in patient assistance for Rybelsus  from [employer] through 02/10/2024 - [employer] has announced that they will no longer offer Rybelsus  to Medicare beneficiaries through their Patient Assistance Program in 2026    Reports completed Diabetes Eye Exam on 11/05/2023 (Noted in patient's chart)   Hypertension:   Current medications:  amlodipine  10 mg daily metoprolol  50 mg twice daily Losartan 25 mg daily Isosorbide ER 30 mg daily    Patient has a validated, automated, upper arm home BP cuff Denies checking home blood pressure recently   Attributes elevated readings to eating saltier food (fast)   Patient denies hypotensive s/sx including dizziness, lightheadedness.    Current Physical Activity: Going to gym 2-3 days/week. Reports walking on treadmill for 30 minutes and then weight machines for 30 minutes x 3-4 days/week   Objective:  Lab Results  Component Value Date   HGBA1C 6.9 (H) 07/14/2023    Lab Results  Component Value Date   CREATININE 0.89 07/14/2023   BUN 17 07/14/2023   NA 138 07/14/2023   K 4.4 07/14/2023   CL 106 07/14/2023   CO2 23 07/14/2023    Lab Results  Component Value Date   CHOL 128 07/14/2023   HDL 51 07/14/2023   LDLCALC 61 07/14/2023   TRIG 84 07/14/2023   CHOLHDL 2.5 07/14/2023   BP Readings from Last 3 Encounters:  08/18/23 128/79  07/14/23 124/72  01/12/23 128/74   Pulse Readings from Last 3 Encounters:  07/01/22 85  03/27/22 77  01/07/22 89    Medications Reviewed Today     Reviewed by Alana Sharyle LABOR, RPH-CPP (Pharmacist)  on 11/20/23 at 1145  Med List Status: <None>   Medication Order Taking? Sig Documenting Provider Last Dose Status Informant  ACCU-CHEK GUIDE TEST test strip [id number]  TEST BLOOD SUGAR ONE TIME DAILY Antonette Angeline ORN, NP  Active   Accu-Chek Softclix Lancets lancets [id number]  TEST BLOOD SUGAR ONE TIME DAILY Antonette Angeline ORN, NP  Active   Alcohol Swabs (DROPSAFE ALCOHOL PREP) 70 % PADS [id number]  USE AS DIRECTED TO CHECK BLOOD SUGAR ONE TIME DAILY Antonette Angeline ORN, NP  Active   amLODipine  (NORVASC ) 10 MG tablet [id number] Yes Take 1 tablet (10 mg total) by mouth daily. Antonette Angeline ORN, NP  Active   aspirin  EC 81 MG tablet [id number]  81 mg daily. Swallow whole. [provider]  Active   blood glucose meter kit and supplies [id number]  Dispense based on patient and insurance preference. Use to check blood sugar daily as directed. DX Ell.9 Antonette Angeline ORN, NP  Active   Blood Glucose Monitoring Suppl (ACCU-CHEK GUIDE) w/Device KIT [id number]  Use to check blood sugar daily for type 2 diabetes E11.9 Antonette Angeline ORN, NP  Active   Cholecalciferol  125 MCG (5000 UT) capsule [id number]  Take 5,000 Units by mouth daily. [provider]  Active   doxylamine, Sleep, (UNISOM) 25 MG tablet [id number]  Take 25 mg by mouth at bedtime as needed. [provider]  Active   glipiZIDE  (GLUCOTROL ) 10 MG tablet [id number] Yes Take 1 tablet (10 mg total) by mouth 2 (two) times daily before a meal. Baity, Angeline ORN, NP  Active   isosorbide mononitrate (IMDUR) 30 MG 24 hr tablet [id number] Yes Take 1 tablet (30 mg total) by mouth once daily [provider]  Active   losartan (COZAAR) 25 MG tablet [id number] Yes Take 1 tablet by mouth daily. [provider]  Active   metFORMIN  (GLUCOPHAGE ) 850 MG tablet [id number] Yes TAKE 1 TABLET(850 MG) BY MOUTH TWICE DAILY WITH A MEAL Baity, Angeline ORN, NP  Active   methocarbamol  (ROBAXIN ) 500 MG tablet [id number]  Take 1 tablet (500 mg total) by mouth every 8  (eight) hours as needed. Antonette Angeline ORN, NP  Active   metoprolol  tartrate (LOPRESSOR ) 50 MG tablet [id number] Yes Take 1 tablet by mouth 2 (two) times daily. [provider]  Active   rosuvastatin  (CRESTOR ) 20 MG tablet [id number]  Take 1 tablet (20 mg total) by mouth daily. Antonette Angeline ORN, NP  Active   Semaglutide  7 MG TABS [id number] Yes Take 7 mg by mouth daily. RYBELSUS  [provider]  Active               Assessment/Plan:   Recommend patient restart using weekly pillbox as adherence aid.    Review with patient that Medicare's Annual Enrollment Period for 2026 starts 11/25/2023. Encourage patient to review deductibles formulary coverage and copay amounts (including for Rybelsus ) between plans. Also share with patient the contact information for the [facility] Information Program [facility]) that can help with reviewing these Medicare  plans   Diabetes: - Currently controlled - Have reviewed long term cardiovascular and renal outcomes of uncontrolled blood sugar - Have reviewed goal A1c, goal fasting, and goal 2 hour post prandial glucose - Encouraged patient to have regular well-balance meals throughout the day (including breakfast), while controlling carbohydrate portion sizes  Discuss ideas for balanced snacks  Advise against skipping meals - Have counseled on s/s of low blood sugar and how to treat lows Patient carries glucose tablets with him in case needed for hypoglycemia Remind patient that if he is unable to eat breakfast, to skip glipizide  dose to avoid hypoglycemia - Recommend to check glucose, keep a log of the results, have this record to review during future appointments, but to contact office sooner if needed for readings outside of established parameters or symptoms      Hypertension: - Currently controlled - Counsel on impact of salt/sodium on blood pressure. Encourage patient to limit intake of salt and fast food - Recommended to  check home blood pressure and heart rate using upper arm monitor, keep a log of the results and have this record to review during future appointments        Follow Up Plan: Clinical Pharmacist will follow up with patient by telephone on 02/26/2024 at 11:30 AM    Sharyle Sia, PharmD, [facility] Clinical Pharmacist [facility] [phone]

## 2023-11-24 ENCOUNTER — Other Ambulatory Visit: Payer: Self-pay | Admitting: Internal Medicine

## 2023-11-24 DIAGNOSIS — E119 Type 2 diabetes mellitus without complications: Secondary | ICD-10-CM

## 2023-11-26 NOTE — Telephone Encounter (Signed)
 Requested Prescriptions  Pending Prescriptions Disp Refills   metFORMIN  (GLUCOPHAGE ) 850 MG tablet [Pharmacy Med Name: METFORMIN  HYDROCHLORIDE 850 MG Oral Tablet] 180 tablet 0    Sig: TAKE 1 TABLET TWICE DAILY WITH MEALS     Endocrinology:  Diabetes - Biguanides Failed - 11/26/2023 12:59 PM      Failed - B12 Level in normal range and within 720 days    No results found for: VITAMINB12       Failed - CBC within normal limits and completed in the last 12 months    WBC  Date Value Ref Range Status  07/14/2023 6.8 3.8 - 10.8 Thousand/uL Final   RBC  Date Value Ref Range Status  07/14/2023 5.20 4.20 - 5.80 Million/uL Final   Hemoglobin  Date Value Ref Range Status  07/14/2023 13.3 13.2 - 17.1 g/dL Final  97/81/7977 85.9 13.0 - 17.7 g/dL Final   HCT  Date Value Ref Range Status  07/14/2023 42.2 38.5 - 50.0 % Final   Hematocrit  Date Value Ref Range Status  03/30/2020 42.5 37.5 - 51.0 % Final   MCHC  Date Value Ref Range Status  07/14/2023 31.5 (L) 32.0 - 36.0 g/dL Final    Comment:    For adults, a slight decrease in the calculated MCHC value (in the range of 30 to 32 g/dL) is most likely not clinically significant; however, it should be interpreted with caution in correlation with other red cell parameters and the patient's clinical condition.    [facility]  Date Value Ref Range Status  07/14/2023 25.6 (L) 27.0 - 33.0 pg Final   MCV  Date Value Ref Range Status  07/14/2023 81.2 80.0 - 100.0 fL Final  03/30/2020 79 79 - 97 fL Final  04/21/2012 80 80 - 100 fL Final   No results found for: PLTCOUNTKUC, LABPLAT, POCPLA RDW  Date Value Ref Range Status  07/14/2023 15.3 (H) 11.0 - 15.0 % Final  03/30/2020 14.6 11.6 - 15.4 % Final  04/21/2012 14.9 (H) 11.5 - 14.5 % Final         Passed - Cr in normal range and within 360 days    Creat  Date Value Ref Range Status  07/14/2023 0.89 0.70 - 1.35 mg/dL Final   Creatinine, Urine  Date Value Ref Range Status   01/12/2023 184 20 - 320 mg/dL Final         Passed - HBA1C is between 0 and 7.9 and within 180 days    HbA1c, POC (controlled diabetic range)  Date Value Ref Range Status  07/01/2022 6.9 0.0 - 7.0 % Final   Hgb A1c MFr Bld  Date Value Ref Range Status  07/14/2023 6.9 (H) <5.7 % Final    Comment:    For someone without known diabetes, a hemoglobin A1c value of 6.5% or greater indicates that they may have  diabetes and this should be confirmed with a follow-up  test. . For someone with known diabetes, a value <7% indicates  that their diabetes is well controlled and a value  greater than or equal to 7% indicates suboptimal  control. A1c targets should be individualized based on  duration of diabetes, age, comorbid conditions, and  other considerations. . Currently, no consensus exists regarding use of hemoglobin A1c for diagnosis of diabetes for children. .          Passed - eGFR in normal range and within 360 days    EGFR (African American)  Date Value Ref Range Status  04/21/2012 59 (L)  Final   GFR calc Af Amer  Date Value Ref Range Status  03/30/2020 100 >59 mL/min/1.73 Final    Comment:    **In accordance with recommendations from the NKF-ASN Task force,**   [facility] is in the process of updating its eGFR calculation to the   2021 CKD-EPI creatinine equation that estimates kidney function   without a race variable.    EGFR (Non-African Amer.)  Date Value Ref Range Status  04/21/2012 51 (L)  Final    Comment:    eGFR values <82mL/min/1.73 m2 may be an indication of chronic kidney disease (CKD). Calculated eGFR is useful in patients with stable renal function. The eGFR calculation will not be reliable in acutely ill patients when serum creatinine is changing rapidly. It is not useful in  patients on dialysis. The eGFR calculation may not be applicable to patients at the low and high extremes of body sizes, pregnant women, and vegetarians.    GFR, Estimated   Date Value Ref Range Status  06/23/2021 >60 >60 mL/min Final    Comment:    (NOTE) Calculated using the CKD-EPI Creatinine Equation (2021)    eGFR  Date Value Ref Range Status  07/14/2023 94 > OR = 60 mL/min/1.56m2 Final         Passed - Valid encounter within last 6 months    Recent Outpatient Visits           4 months ago Type 2 diabetes mellitus with other specified complication, without long-term current use of insulin  (HCC)   [facility] [place], [place], NP              Refused Prescriptions Disp Refills   rosuvastatin  (CRESTOR ) 20 MG tablet [Pharmacy Med Name: ROSUVASTATIN  CALCIUM  20 MG Oral Tablet] 90 tablet 3    Sig: TAKE 1 TABLET EVERY DAY     Cardiovascular:  Antilipid - Statins 2 Failed - 11/26/2023 12:59 PM      Failed - Lipid Panel in normal range within the last 12 months    Cholesterol, Total  Date Value Ref Range Status  03/30/2020 128 100 - 199 mg/dL Final   Cholesterol  Date Value Ref Range Status  07/14/2023 128 <200 mg/dL Final   LDL Cholesterol (Calc)  Date Value Ref Range Status  07/14/2023 61 mg/dL (calc) Final    Comment:    Reference range: <100 . Desirable range <100 mg/dL for primary prevention;   <70 mg/dL for patients with CHD or diabetic patients  with > or = 2 CHD risk factors. SABRA LDL-C is now calculated using the Martin-Hopkins  calculation, which is a validated novel method providing  better accuracy than the Friedewald equation in the  estimation of LDL-C.  Gladis APPLETHWAITE et al. SANDREA. 7986;689(80): 2061-2068  (http://education.QuestDiagnostics.com/faq/FAQ164)    HDL  Date Value Ref Range Status  07/14/2023 51 > OR = 40 mg/dL Final  97/81/7977 41 >60 mg/dL Final   Triglycerides  Date Value Ref Range Status  07/14/2023 84 <150 mg/dL Final         Passed - Cr in normal range and within 360 days    Creat  Date Value Ref Range Status  07/14/2023 0.89 0.70 - 1.35 mg/dL Final   Creatinine,  Urine  Date Value Ref Range Status  01/12/2023 184 20 - 320 mg/dL Final         Passed - Patient is not pregnant      Passed -  Valid encounter within last 12 months    Recent Outpatient Visits           4 months ago Type 2 diabetes mellitus with other specified complication, without long-term current use of insulin  [facility])   [facility] [place], [place], NP               amLODipine  (NORVASC ) 10 MG tablet [Pharmacy Med Name: AMLODIPINE  BESYLATE 10 MG Oral Tablet] 90 tablet 3    Sig: TAKE 1 TABLET EVERY DAY     Cardiovascular: Calcium  Channel Blockers 2 Passed - 11/26/2023 12:59 PM      Passed - Last BP in normal range    BP Readings from Last 1 Encounters:  08/18/23 128/79         Passed - Last Heart Rate in normal range    Pulse Readings from Last 1 Encounters:  07/01/22 85         Passed - Valid encounter within last 6 months    Recent Outpatient Visits           4 months ago Type 2 diabetes mellitus with other specified complication, without long-term current use of insulin  [facility])   [facility] [place], Angeline ORN, NP               glipiZIDE  (GLUCOTROL ) 10 MG tablet [Pharmacy Med Name: GLIPIZIDE  10 MG Oral Tablet] 180 tablet 3    Sig: TAKE 1 TABLET TWICE DAILY BEFORE MEALS     Endocrinology:  Diabetes - Sulfonylureas Passed - 11/26/2023 12:59 PM      Passed - HBA1C is between 0 and 7.9 and within 180 days    HbA1c, POC (controlled diabetic range)  Date Value Ref Range Status  07/01/2022 6.9 0.0 - 7.0 % Final   Hgb A1c MFr Bld  Date Value Ref Range Status  07/14/2023 6.9 (H) <5.7 % Final    Comment:    For someone without known diabetes, a hemoglobin A1c value of 6.5% or greater indicates that they may have  diabetes and this should be confirmed with a follow-up  test. . For someone with known diabetes, a value <7% indicates  that their diabetes is well controlled and a value  greater than or equal  to 7% indicates suboptimal  control. A1c targets should be individualized based on  duration of diabetes, age, comorbid conditions, and  other considerations. . Currently, no consensus exists regarding use of hemoglobin A1c for diagnosis of diabetes for children. .          Passed - Cr in normal range and within 360 days    Creat  Date Value Ref Range Status  07/14/2023 0.89 0.70 - 1.35 mg/dL Final   Creatinine, Urine  Date Value Ref Range Status  01/12/2023 184 20 - 320 mg/dL Final         Passed - Valid encounter within last 6 months    Recent Outpatient Visits           4 months ago Type 2 diabetes mellitus with other specified complication, without long-term current use of insulin  [facility])   [facility] [place], Angeline ORN, [place]

## 2023-12-08 IMAGING — CT CT RENAL STONE PROTOCOL
2 of 4 series · 16 of 46 positions shown, 18 images · non-contrast
Comparison: CT pelvis dated 10/02/2020. CT abdomen/pelvis dated
04/03/2019.

CLINICAL DATA: Flank pain



[Series 2: stone full standard · axial · 0.94mm/px · z∈[-994,-508]mm · 13 of 107 slices shown, 15 images]
[im 5/107  soft-tissue]
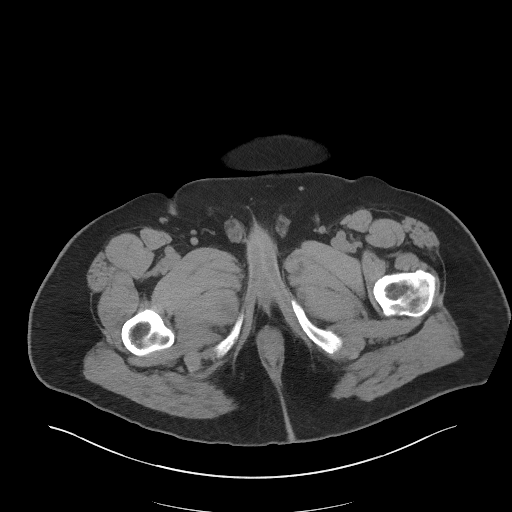
[im 5/107  bone]
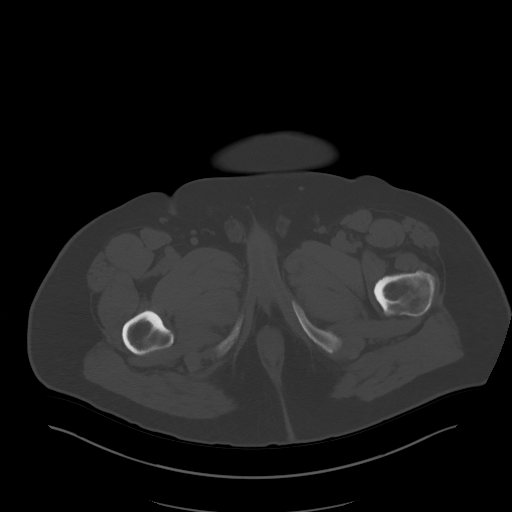
[im 15/107  soft-tissue]
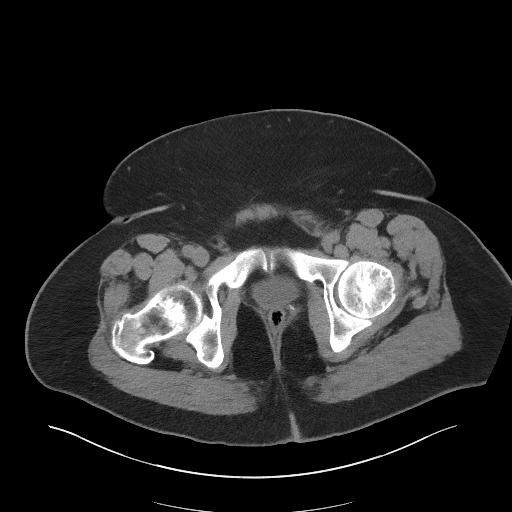
[im 25/107  soft-tissue]
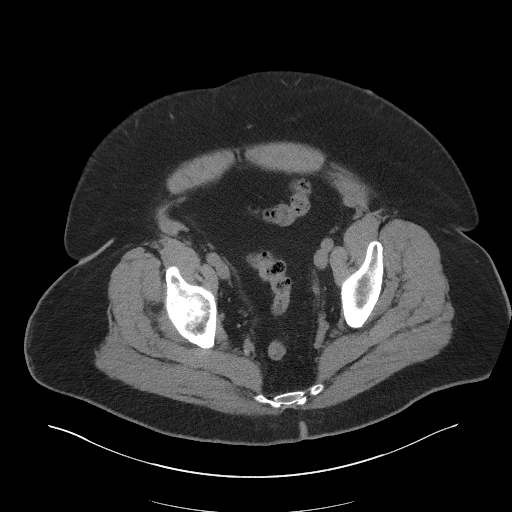
[im 29/107  soft-tissue]
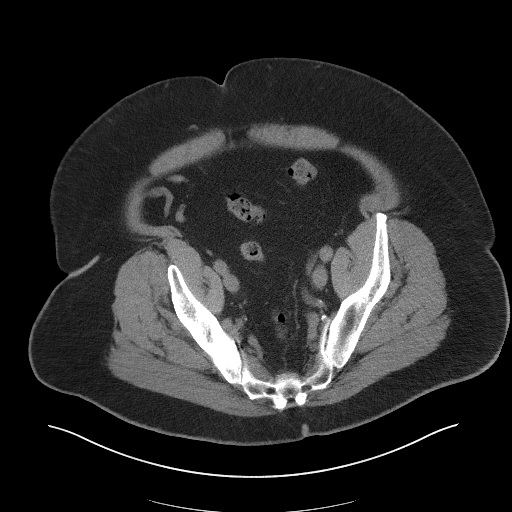
[im 39/107  soft-tissue]
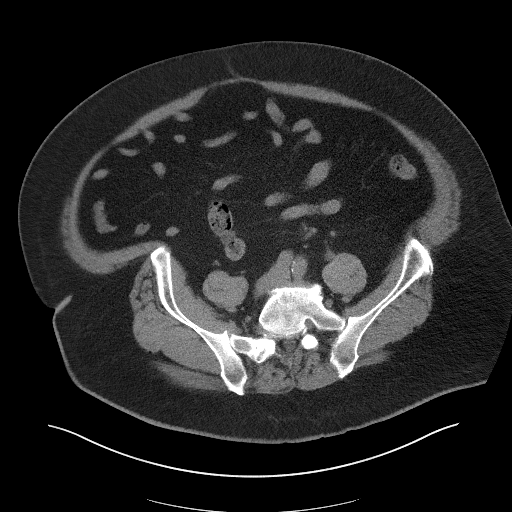
[im 44/107  soft-tissue]
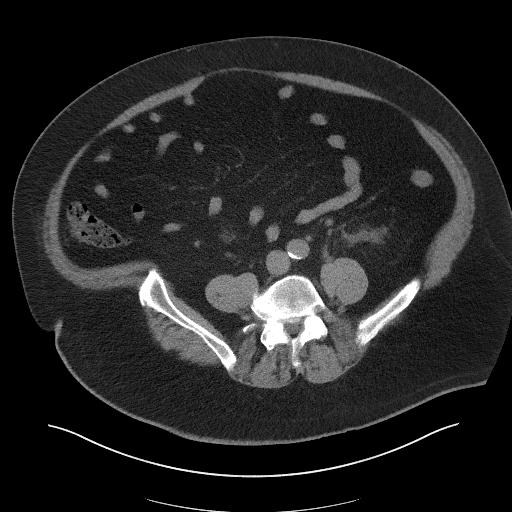
[im 54/107  soft-tissue]
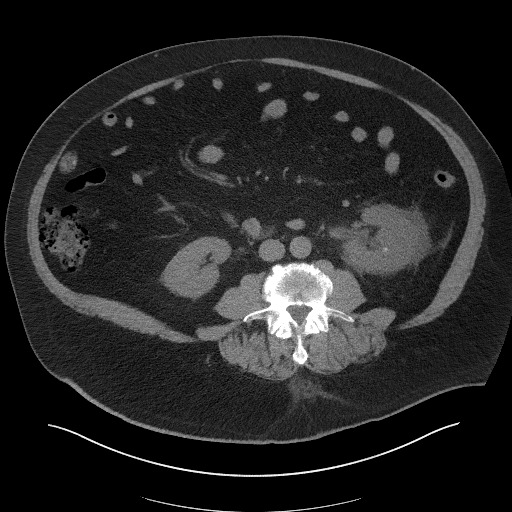
[im 63/107  soft-tissue]
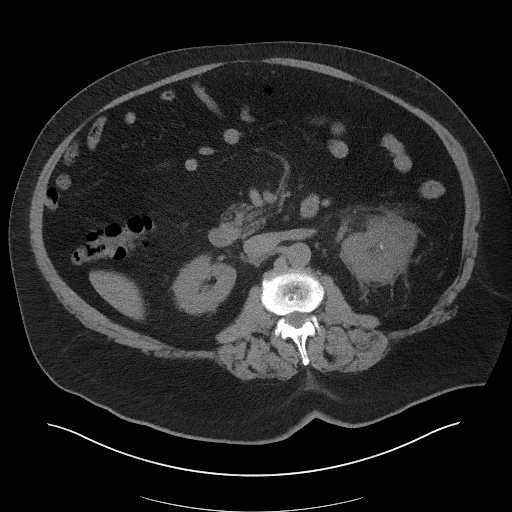
[im 68/107  soft-tissue]
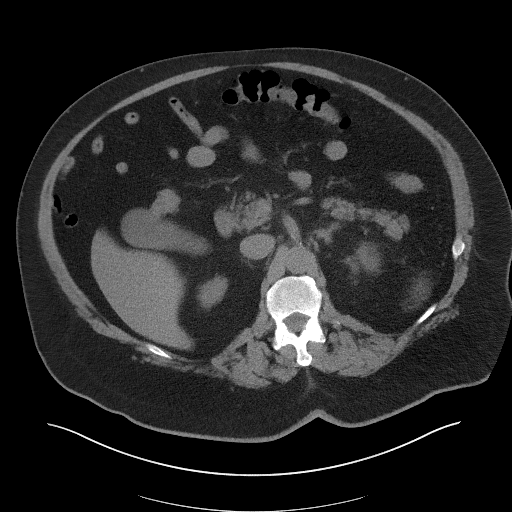
[im 68/107  bone]
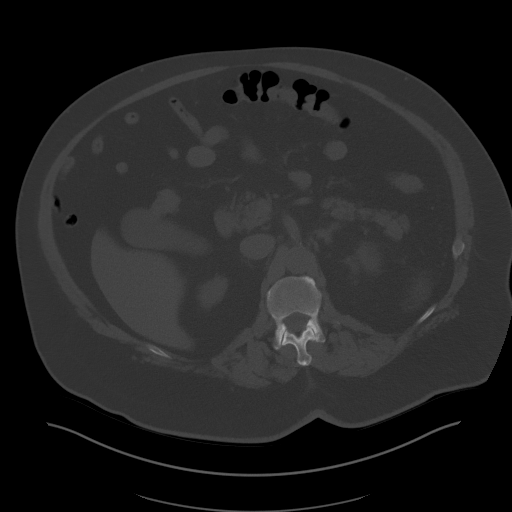
[im 78/107  soft-tissue]
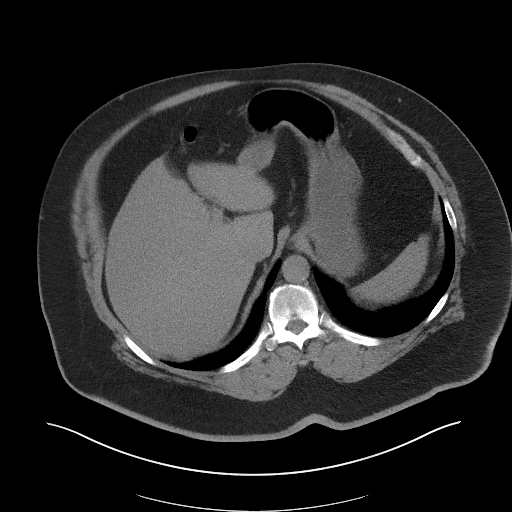
[im 82/107  soft-tissue]
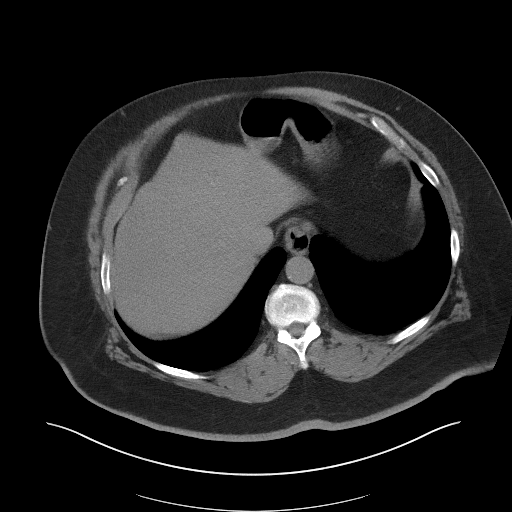
[im 92/107  soft-tissue]
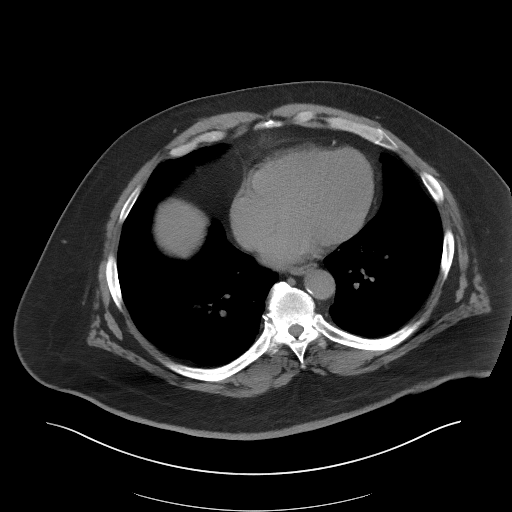
[im 102/107  soft-tissue]
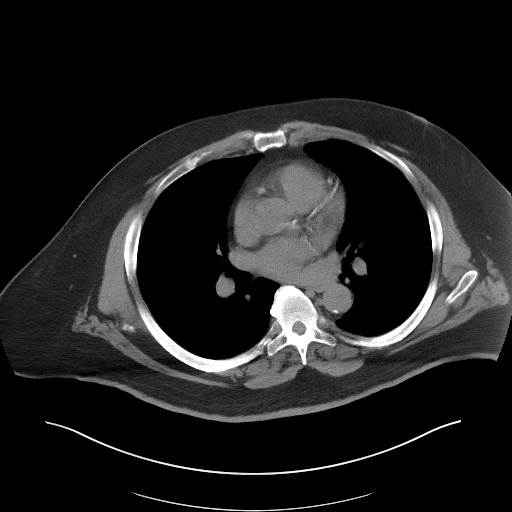

[Series 5: coronal · coronal · 1.02mm/px · 3 of 203 slices shown]
[im 68/203  soft-tissue]
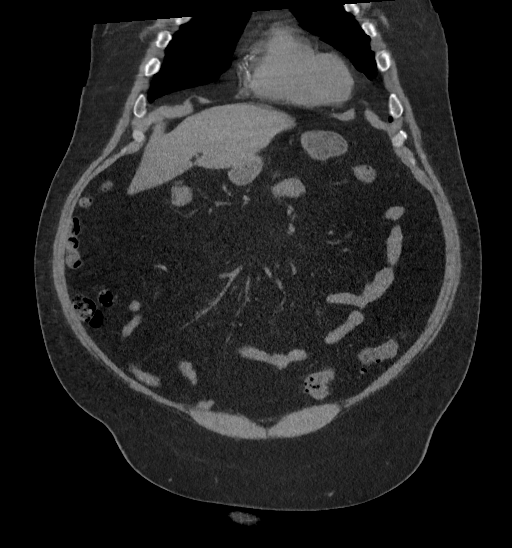
[im 90/203  soft-tissue]
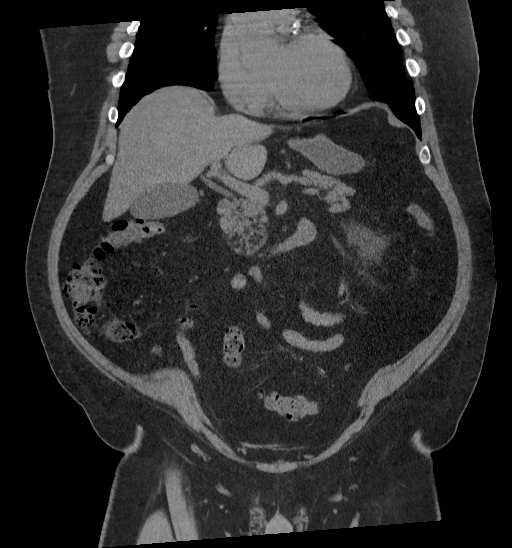
[im 113/203  soft-tissue]
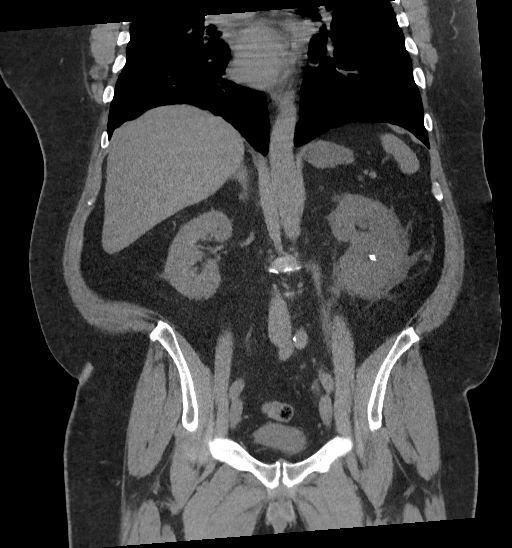

[16 of 46 positions shown; findings below may reference images not displayed]

FINDINGS: Lower chest: Lung bases are clear.

Hepatobiliary: Unenhanced liver is unremarkable.

Gallbladder is unremarkable. No intrahepatic or extrahepatic ductal
dilatation.

Pancreas: Within normal limits.

Spleen: Within normal limits.

Adrenals/Urinary Tract: Adrenal glands are within normal limits.

Multiple nonobstructing bilateral renal calculi measuring up to 9 mm
in the right upper kidney (series 2/image 43). Mild left
hydronephrosis with perinephric fluid/stranding. Associated 5 mm
distal left ureteral calculus just above the UVJ (coronal image
130).

Bladder is notable for a stable partially calcified lesion
anteriorly (series 2/image 88).

Stomach/Bowel: Stomach is notable for a tiny hiatal hernia.

No evidence of bowel obstruction.

Normal appendix (series 2/image 53).

No colonic wall thickening or inflammatory changes.

Vascular/Lymphatic: No evidence of abdominal aortic aneurysm.

Atherosclerotic calcifications of the abdominal aorta and branch
vessels.

No suspicious abdominopelvic lymphadenopathy.

Reproductive: Status post prostatectomy.

Other: No abdominopelvic ascites.

Musculoskeletal: Degenerative changes of the lumbar spine. No focal
osseous lesions.
IMPRESSION: 5 mm distal left ureteral calculus just above the UVJ. Mild left
hydronephrosis.

Additional bilateral nonobstructing renal calculi measuring up to 9
mm in the right upper kidney.

Status post prostatectomy. No findings suspicious for metastatic
disease.

Additional stable ancillary findings as above.

## 2024-01-14 ENCOUNTER — Ambulatory Visit: Admitting: Internal Medicine

## 2024-01-14 ENCOUNTER — Encounter: Payer: Self-pay | Admitting: Internal Medicine

## 2024-01-14 VITALS — BP 122/70 | Ht 73.0 in | Wt 257.2 lb

## 2024-01-14 DIAGNOSIS — E1169 Type 2 diabetes mellitus with other specified complication: Secondary | ICD-10-CM

## 2024-01-14 DIAGNOSIS — Z0001 Encounter for general adult medical examination with abnormal findings: Secondary | ICD-10-CM

## 2024-01-14 DIAGNOSIS — Z125 Encounter for screening for malignant neoplasm of prostate: Secondary | ICD-10-CM | POA: Diagnosis not present

## 2024-01-14 DIAGNOSIS — E66811 Obesity, class 1: Secondary | ICD-10-CM | POA: Diagnosis not present

## 2024-01-14 DIAGNOSIS — Z6833 Body mass index (BMI) 33.0-33.9, adult: Secondary | ICD-10-CM

## 2024-01-14 DIAGNOSIS — E6609 Other obesity due to excess calories: Secondary | ICD-10-CM

## 2024-01-14 DIAGNOSIS — Z23 Encounter for immunization: Secondary | ICD-10-CM | POA: Diagnosis not present

## 2024-01-14 NOTE — Assessment & Plan Note (Signed)
 Encourage diet and exercise for weight loss

## 2024-01-14 NOTE — Progress Notes (Signed)
 Subjective:    Patient ID: Daniel Lauran Raddle., male    DOB: Jun 08, 1955, 68 y.o.   MRN: [id number]  HPI  Patient presents to clinic today for his annual exam.    Flu: 01/2023 Tetanus: 12/2020 COVID: X3 Pneumovax: Never Prevnar 20: 12/2020 Shingrix: Never PSA screening: 11/2022 Colon screening: 09/2021 Vision screening: annually Dentist: biannually  Diet: He does eat meat. He consumes fruits and veggies. He tries to avoid fried foods. He drinks mostly Dt. 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   Elevated prostate specific antigen (PSA) 02/21/2013   Hemorrhoid    History of kidney stones    Hyperlipidemia    Hypertension    Incomplete bladder emptying 01/10/2013   Malignant neoplasm of prostate (HCC) 04/28/2013   prostate removed   Myocardial infarction (HCC) 2019   2 stents placed   Obstructive sleep apnea on CPAP    does not use cpap, does not snore much since loosing 80 lbs   Prostate cancer (HCC)     Current Outpatient Medications  Medication Sig Dispense Refill   ACCU-CHEK GUIDE TEST test strip TEST BLOOD SUGAR ONE TIME DAILY 100 strip 3   Accu-Chek Softclix Lancets lancets TEST BLOOD SUGAR ONE TIME DAILY 100 each 1   Alcohol Swabs (DROPSAFE ALCOHOL PREP) 70 % PADS USE AS DIRECTED TO CHECK BLOOD SUGAR ONE TIME DAILY 100 each 3   amLODipine  (NORVASC ) 10 MG tablet Take 1 tablet (10 mg total) by mouth daily. 90 tablet 1   aspirin  EC 81 MG tablet 81 mg daily. Swallow whole.     blood glucose meter kit and supplies Dispense based on patient and insurance preference. Use to check blood sugar daily as directed. DX Ell.9 1 each 0   Blood Glucose Monitoring Suppl (ACCU-CHEK GUIDE)  w/Device KIT Use to check blood sugar daily for type 2 diabetes E11.9 1 kit 0   Cholecalciferol  125 MCG (5000 UT) capsule Take 5,000 Units by mouth daily.     doxylamine, Sleep, (UNISOM) 25 MG tablet Take 25 mg by mouth at bedtime as needed. (Patient not taking: Reported on 11/20/2023)     glipiZIDE  (GLUCOTROL ) 10 MG tablet Take 1 tablet (10 mg total) by mouth 2 (two) times daily before a meal. 180 tablet 1   isosorbide mononitrate (IMDUR) 30 MG 24 hr tablet Take 1 tablet (30 mg total) by mouth once daily     losartan (COZAAR) 25 MG tablet Take 1 tablet by mouth daily.     metFORMIN  (GLUCOPHAGE ) 850 MG tablet TAKE 1 TABLET TWICE DAILY WITH MEALS 180 tablet 0   methocarbamol  (ROBAXIN ) 500 MG tablet Take 1 tablet (500 mg total) by mouth every 8 (eight) hours as needed. 90 tablet 0   metoprolol  tartrate (LOPRESSOR ) 50 MG tablet Take 1 tablet by mouth 2 (two) times daily.     rosuvastatin  (CRESTOR ) 20 MG tablet Take 1 tablet (20 mg total) by mouth daily. 90 tablet 1   Semaglutide  7 MG TABS Take 7 mg by mouth daily. RYBELSUS      No current facility-administered medications for this visit.    No Known Allergies  Family History  Problem  Relation Age of Onset   Alcoholism Mother    Alcoholism Father    Pancreatic cancer Brother    Prostate cancer Neg Hx    Kidney disease Neg Hx    Cancer - Colon Neg Hx     Social History   Socioeconomic History   Marital status: Single    Spouse name: Not on file   Number of children: 2   Years of education: Not on file   Highest education level: Not on file  Occupational History   Occupation: [employer]    Employer: [employer]    Comment: retired  Tobacco Use   Smoking status: Never   Smokeless tobacco: Never  Vaping Use   Vaping status: Never Used  Substance and Sexual Activity   Alcohol use: No    Alcohol/week: 0.0 standard drinks of alcohol    Comment: rare   Drug use: No   Sexual activity: Not Currently  Other Topics  Concern   Not on file  Social History Narrative   Lives with girlfriend, April   Social Drivers of Health   Financial Resource Strain: Medium Risk (09/18/2023)   Overall Financial Resource Strain (CARDIA)    Difficulty of Paying Living Expenses: Somewhat hard  Food Insecurity: No Food Insecurity (09/18/2023)   Hunger Vital Sign    Worried About Running Out of Food in the Last Year: Never true    Ran Out of Food in the Last Year: Never true  Recent Concern: Food Insecurity - Food Insecurity Present (08/18/2023)   Hunger Vital Sign    Worried About Running Out of Food in the Last Year: Sometimes true    Ran Out of Food in the Last Year: Sometimes true  Transportation Needs: No Transportation Needs (09/18/2023)   PRAPARE - Administrator, Civil Service (Medical): No    Lack of Transportation (Non-Medical): No  Physical Activity: Insufficiently Active (09/18/2023)   Exercise Vital Sign    Days of Exercise per Week: 3 days    Minutes of Exercise per Session: 30 min  Stress: No Stress Concern Present (09/18/2023)   [employer] of Occupational Health - Occupational Stress Questionnaire    Feeling of Stress: Not at all  Social Connections: Socially Isolated (09/18/2023)   Social Connection and Isolation Panel    Frequency of Communication with Friends and Family: More than three times a week    Frequency of Social Gatherings with Friends and Family: Not on file    Attends Religious Services: Never    Database Administrator or Organizations: No    Attends Banker Meetings: Never    Marital Status: Divorced  Catering Manager Violence: Not At Risk (09/18/2023)   Humiliation, Afraid, Rape, and Kick questionnaire    Fear of Current or Ex-Partner: No    Emotionally Abused: No    Physically Abused: No    Sexually Abused: No     Constitutional: Denies fever, malaise, fatigue, headache or abrupt weight changes.  HEENT: Denies eye pain, eye redness, ear pain, ringing in the  ears, wax buildup, runny nose, nasal congestion, bloody nose, or sore throat. Respiratory: Denies difficulty breathing, shortness of breath, cough or sputum production.   Cardiovascular: Denies chest pain, chest tightness, palpitations or swelling in the hands or feet.  Gastrointestinal: Denies abdominal pain, bloating, constipation, diarrhea or blood in the stool.  GU: Patient has a history of erectile dysfunction.  Denies urgency, frequency, pain with urination, burning sensation, blood in  urine, odor or discharge. Musculoskeletal: Patient reports joint pain, mainly in back and hips, gait disturbance.  Denies decrease in range of motion, difficulty with gait, muscle pain or joint swelling.  Skin: Denies redness, rashes, lesions or ulcercations.  Neurological: Pt reports paresthesia of feet. Denies dizziness, difficulty with memory, difficulty with speech or problems with balance and coordination.  Psych: Patient has a history of depression.  Denies anxiety, SI/HI.  No other specific complaints in a complete review of systems (except as listed in HPI above).  Objective:   Physical Exam BP 122/70 (BP Location: Left Arm, Patient Position: Sitting, Cuff Size: Large)   Ht 6' 1 (1.854 m)   Wt 257 lb 3.2 oz (116.7 kg)   BMI 33.93 kg/m     Wt Readings from Last 3 Encounters:  07/14/23 269 lb 12.8 oz (122.4 kg)  01/12/23 281 lb 6.4 oz (127.6 kg)  09/11/22 273 lb (123.8 kg)    General: Appears his stated age, obese, in NAD. Skin: Warm, dry and intact. No ulcerations noted. HEENT: Head: normal shape and size; Eyes: sclera white, no icterus, conjunctiva pink, PERRLA and EOMs intact;  Neck:  Neck supple, trachea midline. No masses, lumps or thyromegaly present.  Cardiovascular: Normal rate and rhythm. S1,S2 noted.  No murmur, rubs or gallops noted. No JVD or BLE edema. No carotid bruits noted. Pulmonary/Chest: Normal effort and positive vesicular breath sounds. No respiratory distress. No  wheezes, rales or ronchi noted.  Abdomen: Soft and nontender. Normal bowel sounds.  Musculoskeletal: Strength 5/5 BUE/BLE.  Swaying gait without device. Neurological: Alert and oriented. Cranial nerves II-XII grossly intact. Coordination normal.  Psychiatric: Mood and affect normal. Behavior is normal. Judgment and thought content normal.    BMET    Component Value Date/Time   NA 138 07/14/2023 1334   NA 139 03/30/2020 1015   NA 139 04/21/2012 0641   K 4.4 07/14/2023 1334   K 3.8 04/21/2012 0641   CL 106 07/14/2023 1334   CL 107 04/21/2012 0641   CO2 23 07/14/2023 1334   CO2 27 04/21/2012 0641   GLUCOSE 87 07/14/2023 1334   GLUCOSE 212 (H) 04/21/2012 0641   BUN 17 07/14/2023 1334   BUN 14 03/30/2020 1015   BUN 22 (H) 04/21/2012 0641   CREATININE 0.89 07/14/2023 1334   CALCIUM  9.8 07/14/2023 1334   CALCIUM  8.7 04/21/2012 0641   GFRNONAA >60 06/23/2021 2133   GFRNONAA 51 (L) 04/21/2012 0641   GFRAA 100 03/30/2020 1015   GFRAA 59 (L) 04/21/2012 0641    Lipid Panel     Component Value Date/Time   CHOL 128 07/14/2023 1334   CHOL 128 03/30/2020 1015   TRIG 84 07/14/2023 1334   HDL 51 07/14/2023 1334   HDL 41 03/30/2020 1015   CHOLHDL 2.5 07/14/2023 1334   VLDL 14 08/20/2017 0359   LDLCALC 61 07/14/2023 1334    CBC    Component Value Date/Time   WBC 6.8 07/14/2023 1334   RBC 5.20 07/14/2023 1334   HGB 13.3 07/14/2023 1334   HGB 14.0 03/30/2020 1015   HCT 42.2 07/14/2023 1334   HCT 42.5 03/30/2020 1015   PLT 290 07/14/2023 1334   PLT 306 03/30/2020 1015   MCV 81.2 07/14/2023 1334   MCV 79 03/30/2020 1015   MCV 80 04/21/2012 0641   MCH 25.6 (L) 07/14/2023 1334   MCHC 31.5 (L) 07/14/2023 1334   RDW 15.3 (H) 07/14/2023 1334   RDW 14.6 03/30/2020 1015   RDW  14.9 (H) 04/21/2012 0641   LYMPHSABS 1.8 03/30/2020 1015   MONOABS 0.5 04/03/2019 0757   EOSABS 0.3 03/30/2020 1015   BASOSABS 0.1 03/30/2020 1015    Hgb A1C Lab Results  Component Value Date   HGBA1C  6.9 (H) 07/14/2023            Assessment & Plan:   Preventative Health Maintenance:  Flu shot today Tetanus UTD Encouraged him to get his COVID booster Prevnar 20 UTD, will not need Pneumovax Discussed Shingrix vaccine, he will check coverage with his insurance company and schedule a visit at the pharmacy if he would like to have this done Colon screening UTD Encouraged him to consume a balanced diet and exercise regimen Advised him to see an eye doctor and dentist annually We will check CBC, c-Met, lipid, A1c, PSA and urine microalbumin today  RTC in 6 months follow-up chronic conditions Angeline Laura, NP

## 2024-01-14 NOTE — Patient Instructions (Signed)
 Health Maintenance, Male  Adopting a healthy lifestyle and getting preventive care are important in promoting health and wellness. Ask your health care provider about:  The right schedule for you to have regular tests and exams.  Things you can do on your own to prevent diseases and keep yourself healthy.  What should I know about diet, weight, and exercise?  Eat a healthy diet    Eat a diet that includes plenty of vegetables, fruits, low-fat dairy products, and lean protein.  Do not eat a lot of foods that are high in solid fats, added sugars, or sodium.  Maintain a healthy weight  Body mass index (BMI) is a measurement that can be used to identify possible weight problems. It estimates body fat based on height and weight. Your health care provider can help determine your BMI and help you achieve or maintain a healthy weight.  Get regular exercise  Get regular exercise. This is one of the most important things you can do for your health. Most adults should:  Exercise for at least 150 minutes each week. The exercise should increase your heart rate and make you sweat (moderate-intensity exercise).  Do strengthening exercises at least twice a week. This is in addition to the moderate-intensity exercise.  Spend less time sitting. Even light physical activity can be beneficial.  Watch cholesterol and blood lipids  Have your blood tested for lipids and cholesterol at 68 years of age, then have this test every 5 years.  You may need to have your cholesterol levels checked more often if:  Your lipid or cholesterol levels are high.  You are older than 68 years of age.  You are at high risk for heart disease.  What should I know about cancer screening?  Many types of cancers can be detected early and may often be prevented. Depending on your health history and family history, you may need to have cancer screening at various ages. This may include screening for:  Colorectal cancer.  Prostate cancer.  Skin cancer.  Lung  cancer.  What should I know about heart disease, diabetes, and high blood pressure?  Blood pressure and heart disease  High blood pressure causes heart disease and increases the risk of stroke. This is more likely to develop in people who have high blood pressure readings or are overweight.  Talk with your health care provider about your target blood pressure readings.  Have your blood pressure checked:  Every 3-5 years if you are 24-52 years of age.  Every year if you are 3 years old or older.  If you are between the ages of 60 and 72 and are a current or former smoker, ask your health care provider if you should have a one-time screening for abdominal aortic aneurysm (AAA).  Diabetes  Have regular diabetes screenings. This checks your fasting blood sugar level. Have the screening done:  Once every three years after age 66 if you are at a normal weight and have a low risk for diabetes.  More often and at a younger age if you are overweight or have a high risk for diabetes.  What should I know about preventing infection?  Hepatitis B  If you have a higher risk for hepatitis B, you should be screened for this virus. Talk with your health care provider to find out if you are at risk for hepatitis B infection.  Hepatitis C  Blood testing is recommended for:  Everyone born from [birth year] through 1965.  Anyone  with known risk factors for hepatitis C.  Sexually transmitted infections (STIs)  You should be screened each year for STIs, including gonorrhea and chlamydia, if:  You are sexually active and are younger than 68 years of age.  You are older than 68 years of age and your health care provider tells you that you are at risk for this type of infection.  Your sexual activity has changed since you were last screened, and you are at increased risk for chlamydia or gonorrhea. Ask your health care provider if you are at risk.  Ask your health care provider about whether you are at high risk for HIV. Your health care provider  may recommend a prescription medicine to help prevent HIV infection. If you choose to take medicine to prevent HIV, you should first get tested for HIV. You should then be tested every 3 months for as long as you are taking the medicine.  Follow these instructions at home:  Alcohol use  Do not drink alcohol if your health care provider tells you not to drink.  If you drink alcohol:  Limit how much you have to 0-2 drinks a day.  Know how much alcohol is in your drink. In the [place] one drink equals one 12 oz bottle of beer (355 mL), one 5 oz glass of wine (148 mL), or one 1 oz glass of hard liquor (44 mL).  Lifestyle  Do not use any products that contain nicotine or tobacco. These products include cigarettes, chewing tobacco, and vaping devices, such as e-cigarettes. If you need help quitting, ask your health care provider.  Do not use street drugs.  Do not share needles.  Ask your health care provider for help if you need support or information about quitting drugs.  General instructions  Schedule regular health, dental, and eye exams.  Stay current with your vaccines.  Tell your health care provider if:  You often feel depressed.  You have ever been abused or do not feel safe at home.  Summary  Adopting a healthy lifestyle and getting preventive care are important in promoting health and wellness.  Follow your health care provider's instructions about healthy diet, exercising, and getting tested or screened for diseases.  Follow your health care provider's instructions on monitoring your cholesterol and blood pressure.  This information is not intended to replace advice given to you by your health care provider. Make sure you discuss any questions you have with your health care provider.  Document Revised: 06/18/2020 Document Reviewed: 06/18/2020  Elsevier Patient Education  2024 [employer].

## 2024-01-15 ENCOUNTER — Ambulatory Visit: Payer: Self-pay | Admitting: Internal Medicine

## 2024-01-15 LAB — LIPID PANEL
Cholesterol: 131 mg/dL (ref <200)
HDL: 44 mg/dL (ref >=40)
LDL Cholesterol (Calc): 71 mg/dL
Non-HDL Cholesterol (Calc): 87 mg/dL (ref <130)
Total CHOL/HDL Ratio: 3 (calc) (ref <5.0)
Triglycerides: 77 mg/dL (ref <150)

## 2024-01-15 LAB — CBC
HCT: 42.6 % (ref 39.4–51.1)
Hemoglobin: 13.7 g/dL (ref 13.2–17.1)
MCH: 25.9 pg — ABNORMAL LOW (ref 27.0–33.0)
MCHC: 32.2 g/dL (ref 31.6–35.4)
MCV: 80.5 fL — ABNORMAL LOW (ref 81.4–101.7)
MPV: 10.1 fL (ref 7.5–12.5)
Platelets: 323 Thousand/uL (ref 140–400)
RBC: 5.29 Million/uL (ref 4.20–5.80)
RDW: 14.5 % (ref 11.0–15.0)
WBC: 6 Thousand/uL (ref 3.8–10.8)

## 2024-01-15 LAB — COMPREHENSIVE METABOLIC PANEL WITH GFR
AG Ratio: 1.4 (calc) (ref 1.0–2.5)
ALT: 10 U/L (ref 9–46)
AST: 12 U/L (ref 10–35)
Albumin: 4.2 g/dL (ref 3.6–5.1)
Alkaline phosphatase (APISO): 104 U/L (ref 35–144)
BUN: 14 mg/dL (ref 7–25)
CO2: 26 mmol/L (ref 20–32)
Calcium: 9.6 mg/dL (ref 8.6–10.3)
Chloride: 104 mmol/L (ref 98–110)
Creat: 1 mg/dL (ref 0.70–1.35)
Globulin: 2.9 g/dL (ref 1.9–3.7)
Glucose, Bld: 110 mg/dL — ABNORMAL HIGH (ref 65–99)
Potassium: 4.4 mmol/L (ref 3.5–5.3)
Sodium: 139 mmol/L (ref 135–146)
Total Bilirubin: 0.7 mg/dL (ref 0.2–1.2)
Total Protein: 7.1 g/dL (ref 6.1–8.1)
eGFR: 82 mL/min/1.73m2 (ref >=60)

## 2024-01-15 LAB — HEMOGLOBIN A1C
Hgb A1c MFr Bld: 6.9 % — ABNORMAL HIGH (ref <5.7)
Mean Plasma Glucose: 151 mg/dL
eAG (mmol/L): 8.4 mmol/L

## 2024-01-15 LAB — MICROALBUMIN / CREATININE URINE RATIO
Creatinine, Urine: 170 mg/dL (ref 20–320)
Microalb Creat Ratio: 52 mg/g{creat} — ABNORMAL HIGH (ref <30)
Microalb, Ur: 8.8 mg/dL

## 2024-01-15 LAB — PSA: PSA: 0.1 ng/mL (ref <=4.00)

## 2024-02-26 ENCOUNTER — Other Ambulatory Visit: Admitting: Pharmacist

## 2024-02-26 DIAGNOSIS — I10 Essential (primary) hypertension: Secondary | ICD-10-CM

## 2024-02-26 DIAGNOSIS — E1169 Type 2 diabetes mellitus with other specified complication: Secondary | ICD-10-CM

## 2024-02-26 DIAGNOSIS — I25810 Atherosclerosis of coronary artery bypass graft(s) without angina pectoris: Secondary | ICD-10-CM

## 2024-02-26 DIAGNOSIS — Z7984 Long term (current) use of oral hypoglycemic drugs: Secondary | ICD-10-CM

## 2024-02-26 MED ORDER — SEMAGLUTIDE 7 MG PO TABS: 7.0000 mg | ORAL_TABLET | Freq: Every day | ORAL | 0 refills | Status: AC

## 2024-02-26 NOTE — Addendum Note (Signed)
 Addended by: ANTONETTE ANGELINE ORN on: 02/26/2024 12:59 PM   Modules accepted: Orders

## 2024-02-26 NOTE — Patient Instructions (Signed)
Goals Addressed             This Visit's Progress    Pharmacy Goals       Our goal A1c is less than 7%. This corresponds with fasting sugars less than 130 and 2 hour after meal sugars less than 180. Please keep a log of your results when checking your blood sugar   Our goal bad cholesterol, or LDL, is less than 70 . This is why it is important to continue taking your rosuvastatin.  Check your blood pressure twice weekly, and any time you have concerning symptoms like headache, chest pain, dizziness, shortness of breath, or vision changes.   Our goal is less than 130/80.  To appropriately check your blood pressure, make sure you do the following:  1) Avoid caffeine, exercise, or tobacco products for 30 minutes before checking. Empty your bladder. 2) Sit with your back supported in a flat-backed chair. Rest your arm on something flat (arm of the chair, table, etc). 3) Sit still with your feet flat on the floor, resting, for at least 5 minutes.  4) Check your blood pressure. Take 1-2 readings.  5) Write down these readings and bring with you to any provider appointments.  Bring your home blood pressure machine with you to a provider's office for accuracy comparison at least once a year.   Make sure you take your blood pressure medications before you come to any office visit, even if you were asked to fast for labs.   Estelle Grumbles, PharmD, [facility] Clinical Pharmacist [facility] [phone]

## 2024-02-26 NOTE — Progress Notes (Signed)
 "  02/26/2024 Name: Daniel Bradley. MRN: [id number] DOB: 1955/03/14  Chief Complaint  Patient presents with   Medication Management   Medication Assistance   Medication Adherence    Daniel Diles Jr. is a 69 y.o. year old male who presented for a telephone visit.   They were referred to the pharmacist by their PCP for assistance in managing diabetes and hypertension.      Subjective:   Care Team: Primary Care Provider: Antonette Angeline ORN, NP ; Next Scheduled Visit: 07/14/2024 Cardiologist: Florencio Cara Endow, MD; Next Scheduled Visit: 08/16/2024 Urologist: Penne Knee, MD   Medication Access/Adherence  Current Pharmacy:  [facility] - [place], [place] - [address] [place] [address] Phone: [phone] Fax: [phone]  [facility] [phone] - [place], [place] - [address] AT [facility] OF [address] [place] [address] Phone: [phone] Fax: [phone]   Patient reports affordability concerns with their medications: No  Patient reports access/transportation concerns to their pharmacy: No  Patient reports adherence concerns with their medications:  No       Reports takes his medications directly from pill bottles and has a routine to aid with adherence   Reports enrolled again with [facility] for 2026 calendar year   From review of chart, note patient seen for Office Visit with Cardiologist on 02/17/2024. Dr. Florencio advised patient to: - Increase losartan - Decrease metoprolol  - STOP Imdur - Start Zetia - Referred to Dr. DELENA for further discussion of sleep apnea treatment options.   Diabetes:   Current medications:  Glipizide  10 mg twice daily before breakfast and supper (30 minutes prior to meal) Metformin  850 mg twice daily Rybelsus  7 mg daily  Confirms taking on an empty stomach, >=30 minutes before the first food, beverage, or other oral medications of the  day with <=4 oz of plain water only   Medications tried in the past: Rybelsus  (cost); Ozempic  (cost); Tresiba  (cost)    Reports latest morning fasting blood sugar readings ranging: 119-120s   Denies recent symptoms of hypoglycemia - Carries glucose tablets       Statin therapy: rosuvastatin  20 mg daily (also now also taking ezetimibe 10 mg daily as prescribed by Cardiologist)   Current Physical Activity: Going to gym 1-2 days/week. Reports walking on treadmill for 30 minutes and then weight machines for 30 minutes x 3-4 days/week   Reports when last checked, home weight was ~252 lbs   Current medication access support: none; previously enrolled in patient assistance for Rybelsus  from [employer], but no longer available to Medicare patients through program as of 02/10/2024 - Reports currently has ~2 month supply remaining       Hypertension:   Current medications:  amlodipine  10 mg daily metoprolol  25 mg twice daily (confirms decreased to current dose last week as directed by Cardiologist) Losartan 50 mg daily (confirms increased to current dose last week as directed by Cardiologist)   Patient has a validated, automated, upper arm home BP cuff; recalls last checked: - 1/1: 122/76, HR 77 - 1/2 130/79, HR 76 - 1/3:120/76, HR 77    Attributes elevated readings to eating saltier food (fast)   Patient denies hypotensive s/sx including dizziness, lightheadedness.    Current Physical Activity: Going to gym 1-2 days/week. Reports walking on treadmill for 30 minutes and then weight machines for 30 minutes x 3-4 days/week  Objective:  Lab Results  Component Value Date   HGBA1C 6.9 (H) 01/14/2024    Lab Results  Component Value Date   CREATININE 1.00 01/14/2024   BUN 14 01/14/2024   NA 139 01/14/2024   K 4.4 01/14/2024   CL 104 01/14/2024   CO2 26 01/14/2024    Lab Results  Component Value Date   CHOL 131 01/14/2024   HDL 44 01/14/2024   LDLCALC 71 01/14/2024    TRIG 77 01/14/2024   CHOLHDL 3.0 01/14/2024   BP Readings from Last 3 Encounters:  01/14/24 122/70  08/18/23 128/79  07/14/23 124/72   Pulse Readings from Last 3 Encounters:  07/01/22 85  03/27/22 77  01/07/22 89    Medications Reviewed Today     Reviewed by Alana Sharyle LABOR, RPH-CPP (Pharmacist) on 02/26/24 at 1207  Med List Status: <None>   Medication Order Taking? Sig Documenting Provider Last Dose Status Informant  ACCU-CHEK GUIDE TEST test strip [id number]  TEST BLOOD SUGAR ONE TIME DAILY Antonette Angeline ORN, NP  Active   Accu-Chek Softclix Lancets lancets [id number]  TEST BLOOD SUGAR ONE TIME DAILY Antonette Angeline ORN, NP  Active   Alcohol Swabs (DROPSAFE ALCOHOL PREP) 70 % PADS [id number]  USE AS DIRECTED TO CHECK BLOOD SUGAR ONE TIME DAILY Antonette Angeline ORN, NP  Active   amLODipine  (NORVASC ) 10 MG tablet [id number] Yes Take 1 tablet (10 mg total) by mouth daily. Antonette Angeline ORN, NP  Active   aspirin  EC 81 MG tablet [id number] Yes 81 mg daily. Swallow whole. [provider]  Active   blood glucose meter kit and supplies [id number]  Dispense based on patient and insurance preference. Use to check blood sugar daily as directed. DX Ell.9 Antonette Angeline ORN, NP  Active   Blood Glucose Monitoring Suppl (ACCU-CHEK GUIDE) w/Device KIT [id number]  Use to check blood sugar daily for type 2 diabetes E11.9 Antonette Angeline ORN, NP  Active   Cholecalciferol  125 MCG (5000 UT) capsule [id number] Yes Take 5,000 Units by mouth daily. [provider]  Active   ezetimibe (ZETIA) 10 MG tablet [id number] Yes Take 10 mg by mouth daily. [provider]  Active   glipiZIDE  (GLUCOTROL ) 10 MG tablet [id number]  Take 1 tablet (10 mg total) by mouth 2 (two) times daily before a meal. Antonette Angeline ORN, NP  Active     Discontinued 02/26/24 1200 (Discontinued by provider)     Discontinued 02/26/24 1202 (Change in therapy)   losartan (COZAAR) 50 MG tablet [id number] Yes Take 50 mg by mouth daily. [provider]  Active   metFORMIN  (GLUCOPHAGE ) 850 MG tablet [id number] Yes TAKE 1 TABLET TWICE DAILY WITH MEALS Baity, Angeline ORN, NP  Active   methocarbamol  (ROBAXIN ) 500 MG tablet [id number]  Take 1 tablet (500 mg total) by mouth every 8 (eight) hours as needed. Antonette Angeline ORN, NP  Active   metoprolol  tartrate (LOPRESSOR ) 25 MG tablet [id number] Yes Take 25 mg by mouth 2 (two) times daily. [provider]  Active    Patient not taking:   Discontinued 02/26/24 1203 (Dose change)   rosuvastatin  (CRESTOR ) 20 MG tablet [id number] Yes Take 1 tablet (20 mg total) by mouth daily. Antonette Angeline ORN, NP  Active   Semaglutide  7 MG TABS [id number] Yes Take 7 mg by mouth daily. RYBELSUS  [provider]  Active               Assessment/Plan:   Comprehensive medication review performed; medication list  updated in electronic medical record - Identify patient in need of refill of his glipizide . He will contact pharmacy for refill  Recommend patient restart using weekly pillbox as adherence aid.    Review with patient that per [facility] website, the [employer] plan has a $250 annual prescription deductible (impacting tiers 3-5) and tier 3 copayment of $47 (for medications such as Rybelsus ) in 2026. Note out of pocket prescription drug cost in 2026 is capped at $2,100.  - Patient states that he thinks cost of Rybelsus  will be affordable through his insurance, provided that he plans ahead for the deductible Will ask PCP to send prescription for Rybelsus  to local [facility] for patient to pick up when needed   Patient to call back regarding referral about sleep apnea/CPAP  Diabetes: - Currently controlled - Have reviewed long term cardiovascular and renal outcomes of uncontrolled blood sugar - Have reviewed goal A1c, goal fasting, and goal 2 hour post prandial glucose - Encouraged patient to have regular well-balance meals throughout the day (including breakfast), while  controlling carbohydrate portion sizes             Advise against skipping meals - Have counseled on s/s of low blood sugar and how to treat lows Patient carries glucose tablets with him in case needed for hypoglycemia Remind patient that if he is unable to eat breakfast, to skip glipizide  dose to avoid hypoglycemia - Recommend to check glucose, keep a log of the results, have this record to review during future appointments, but to contact office sooner if needed for readings outside of established parameters or symptoms      Hypertension: - Currently controlled - Counsel on impact of salt/sodium on blood pressure. Encourage patient to limit intake of salt and fast food - Recommended to check home blood pressure and heart rate using upper arm monitor, keep a log of the results and have this record to review during future appointments  Patient to contact Cardiologist sooner if needed for readings outside of established parameters or new symptoms       Follow Up Plan: Clinical Pharmacist will follow up with patient by telephone on 04/22/2024 at 11:30 AM    Sharyle Sia, PharmD, [facility] Clinical Pharmacist [facility] [phone]   "

## 2024-04-22 ENCOUNTER — Other Ambulatory Visit

## 2024-07-14 ENCOUNTER — Ambulatory Visit: Admitting: Internal Medicine

## 2024-09-30 ENCOUNTER — Ambulatory Visit

## 2024-10-12 ENCOUNTER — Ambulatory Visit
